# Patient Record
Sex: Female | Born: 1950 | Race: Black or African American | Hispanic: Refuse to answer | Marital: Married | Smoking: Former smoker
Health system: Southern US, Community
[De-identification: ages and names within clinical notes are randomized; demographics above are authoritative.]

## PROBLEM LIST (undated history)

## (undated) DIAGNOSIS — I10 Essential (primary) hypertension: Secondary | ICD-10-CM

## (undated) DIAGNOSIS — Z87891 Personal history of nicotine dependence: Secondary | ICD-10-CM

## (undated) DIAGNOSIS — G459 Transient cerebral ischemic attack, unspecified: Secondary | ICD-10-CM

## (undated) DIAGNOSIS — I4892 Unspecified atrial flutter: Secondary | ICD-10-CM

## (undated) DIAGNOSIS — I6789 Other cerebrovascular disease: Secondary | ICD-10-CM

## (undated) DIAGNOSIS — I714 Abdominal aortic aneurysm, without rupture, unspecified: Secondary | ICD-10-CM

## (undated) DIAGNOSIS — I639 Cerebral infarction, unspecified: Secondary | ICD-10-CM

## (undated) DIAGNOSIS — R0600 Dyspnea, unspecified: Secondary | ICD-10-CM

## (undated) DIAGNOSIS — I2510 Atherosclerotic heart disease of native coronary artery without angina pectoris: Secondary | ICD-10-CM

## (undated) DIAGNOSIS — R0609 Other forms of dyspnea: Secondary | ICD-10-CM

## (undated) DIAGNOSIS — E785 Hyperlipidemia, unspecified: Secondary | ICD-10-CM

## (undated) DIAGNOSIS — I71012 Dissection of descending thoracic aorta: Secondary | ICD-10-CM

## (undated) DIAGNOSIS — G4733 Obstructive sleep apnea (adult) (pediatric): Secondary | ICD-10-CM

## (undated) DIAGNOSIS — I700 Atherosclerosis of aorta: Secondary | ICD-10-CM

## (undated) DIAGNOSIS — J449 Chronic obstructive pulmonary disease, unspecified: Secondary | ICD-10-CM

## (undated) DIAGNOSIS — Z8679 Personal history of other diseases of the circulatory system: Secondary | ICD-10-CM

## (undated) DIAGNOSIS — F32A Depression, unspecified: Secondary | ICD-10-CM

## (undated) DIAGNOSIS — R7303 Prediabetes: Secondary | ICD-10-CM

## (undated) DIAGNOSIS — M1990 Unspecified osteoarthritis, unspecified site: Secondary | ICD-10-CM

## (undated) DIAGNOSIS — K5792 Diverticulitis of intestine, part unspecified, without perforation or abscess without bleeding: Secondary | ICD-10-CM

## (undated) DIAGNOSIS — Z9841 Cataract extraction status, right eye: Secondary | ICD-10-CM

## (undated) DIAGNOSIS — R911 Solitary pulmonary nodule: Secondary | ICD-10-CM

## (undated) DIAGNOSIS — I5189 Other ill-defined heart diseases: Secondary | ICD-10-CM

## (undated) DIAGNOSIS — Z7982 Long term (current) use of aspirin: Secondary | ICD-10-CM

## (undated) DIAGNOSIS — G4730 Sleep apnea, unspecified: Secondary | ICD-10-CM

## (undated) DIAGNOSIS — I209 Angina pectoris, unspecified: Secondary | ICD-10-CM

## (undated) DIAGNOSIS — G629 Polyneuropathy, unspecified: Secondary | ICD-10-CM

## (undated) DIAGNOSIS — D649 Anemia, unspecified: Secondary | ICD-10-CM

## (undated) DIAGNOSIS — K219 Gastro-esophageal reflux disease without esophagitis: Secondary | ICD-10-CM

## (undated) DIAGNOSIS — G709 Myoneural disorder, unspecified: Secondary | ICD-10-CM

## (undated) DIAGNOSIS — Z9842 Cataract extraction status, left eye: Secondary | ICD-10-CM

## (undated) HISTORY — PX: CARDIAC CATHETERIZATION: SHX172

## (undated) HISTORY — PX: ECTOPIC PREGNANCY SURGERY: SHX613

## (undated) HISTORY — PX: CHOLECYSTECTOMY: SHX55

## (undated) HISTORY — PX: ABDOMINAL SURGERY: SHX537

## (undated) HISTORY — DX: Chronic obstructive pulmonary disease, unspecified: J44.9

## (undated) HISTORY — PX: OTHER SURGICAL HISTORY: SHX169

---

## 1982-03-06 DIAGNOSIS — Z9889 Other specified postprocedural states: Secondary | ICD-10-CM | POA: Insufficient documentation

## 2000-02-21 DIAGNOSIS — I059 Rheumatic mitral valve disease, unspecified: Secondary | ICD-10-CM | POA: Insufficient documentation

## 2000-02-21 DIAGNOSIS — F32A Depression, unspecified: Secondary | ICD-10-CM | POA: Insufficient documentation

## 2000-02-21 DIAGNOSIS — D649 Anemia, unspecified: Secondary | ICD-10-CM | POA: Insufficient documentation

## 2004-01-24 ENCOUNTER — Emergency Department: Payer: Self-pay | Admitting: Emergency Medicine

## 2004-02-05 ENCOUNTER — Ambulatory Visit: Payer: Self-pay | Admitting: Family Medicine

## 2005-12-21 ENCOUNTER — Emergency Department: Payer: Self-pay | Admitting: Emergency Medicine

## 2006-01-10 ENCOUNTER — Emergency Department: Payer: Self-pay | Admitting: Emergency Medicine

## 2006-03-24 ENCOUNTER — Emergency Department: Payer: Self-pay | Admitting: Emergency Medicine

## 2006-03-26 ENCOUNTER — Emergency Department: Payer: Self-pay | Admitting: Emergency Medicine

## 2006-08-25 ENCOUNTER — Emergency Department (HOSPITAL_COMMUNITY): Admission: EM | Admit: 2006-08-25 | Discharge: 2006-08-25 | Payer: Self-pay | Admitting: Emergency Medicine

## 2007-12-11 DIAGNOSIS — I10 Essential (primary) hypertension: Secondary | ICD-10-CM | POA: Insufficient documentation

## 2009-01-04 DIAGNOSIS — R319 Hematuria, unspecified: Secondary | ICD-10-CM | POA: Insufficient documentation

## 2009-01-04 DIAGNOSIS — R1032 Left lower quadrant pain: Secondary | ICD-10-CM | POA: Insufficient documentation

## 2009-01-05 ENCOUNTER — Emergency Department: Payer: Self-pay | Admitting: Emergency Medicine

## 2009-01-08 DIAGNOSIS — K5732 Diverticulitis of large intestine without perforation or abscess without bleeding: Secondary | ICD-10-CM | POA: Insufficient documentation

## 2009-02-22 ENCOUNTER — Ambulatory Visit: Payer: Self-pay | Admitting: Gastroenterology

## 2009-02-22 LAB — HM COLONOSCOPY

## 2010-05-29 ENCOUNTER — Emergency Department: Payer: Self-pay | Admitting: Emergency Medicine

## 2010-12-21 LAB — D-DIMER, QUANTITATIVE: D-Dimer, Quant: <0.22

## 2010-12-21 LAB — I-STAT 8, (EC8 V) (CONVERTED LAB)
Acid-Base Excess: 1
BUN: 16
Bicarbonate: 27 — ABNORMAL HIGH
Chloride: 105
Glucose, Bld: 88
HCT: 45
Hemoglobin: 15.3 — ABNORMAL HIGH
Operator id: 196461
Potassium: 4.1
Sodium: 138
TCO2: 28
pCO2, Ven: 46.6
pH, Ven: 7.371 — ABNORMAL HIGH

## 2010-12-21 LAB — POCT CARDIAC MARKERS
CKMB, poc: <1 — ABNORMAL LOW
Myoglobin, poc: 43.5
Operator id: 196461
Troponin i, poc: <0.05

## 2010-12-21 LAB — POCT I-STAT CREATININE
Creatinine, Ser: 0.9
Operator id: 196461

## 2011-10-29 ENCOUNTER — Emergency Department: Payer: Self-pay | Admitting: Emergency Medicine

## 2011-10-29 LAB — COMPREHENSIVE METABOLIC PANEL
Albumin: 3.3 g/dL — ABNORMAL LOW (ref 3.4–5.0)
Alkaline Phosphatase: 93 U/L (ref 50–136)
Anion Gap: 7 (ref 7–16)
BUN: 18 mg/dL (ref 7–18)
Bilirubin,Total: 0.4 mg/dL (ref 0.2–1.0)
Calcium, Total: 9.1 mg/dL (ref 8.5–10.1)
Chloride: 108 mmol/L — ABNORMAL HIGH (ref 98–107)
Co2: 26 mmol/L (ref 21–32)
Creatinine: 0.87 mg/dL (ref 0.60–1.30)
EGFR (African American): >60
EGFR (Non-African Amer.): >60
Glucose: 104 mg/dL — ABNORMAL HIGH (ref 65–99)
Osmolality: 283 (ref 275–301)
Potassium: 3.3 mmol/L — ABNORMAL LOW (ref 3.5–5.1)
SGOT(AST): 22 U/L (ref 15–37)
SGPT (ALT): 20 U/L (ref 12–78)
Sodium: 141 mmol/L (ref 136–145)
Total Protein: 7 g/dL (ref 6.4–8.2)

## 2011-10-29 LAB — URINALYSIS, COMPLETE
Bilirubin,UR: NEGATIVE
Glucose,UR: NEGATIVE mg/dL (ref 0–75)
Ketone: NEGATIVE
Nitrite: NEGATIVE
Ph: 5 (ref 4.5–8.0)
Protein: NEGATIVE
RBC,UR: 5 /HPF (ref 0–5)
Specific Gravity: 1.027 (ref 1.003–1.030)
Squamous Epithelial: 17
WBC UR: 19 /HPF (ref 0–5)

## 2011-10-29 LAB — CBC
HCT: 37.9 % (ref 35.0–47.0)
HGB: 13.1 g/dL (ref 12.0–16.0)
MCH: 31.2 pg (ref 26.0–34.0)
MCHC: 34.6 g/dL (ref 32.0–36.0)
MCV: 90 fL (ref 80–100)
Platelet: 170 10*3/uL (ref 150–440)
RBC: 4.21 10*6/uL (ref 3.80–5.20)
RDW: 13.2 % (ref 11.5–14.5)
WBC: 7.7 10*3/uL (ref 3.6–11.0)

## 2011-10-29 LAB — CK TOTAL AND CKMB (NOT AT ARMC)
CK, Total: 85 U/L (ref 21–215)
CK-MB: <0.5 ng/mL — ABNORMAL LOW (ref 0.5–3.6)

## 2011-10-29 LAB — TSH: Thyroid Stimulating Horm: 0.746 u[IU]/mL

## 2011-10-29 LAB — MAGNESIUM: Magnesium: 1.8 mg/dL

## 2011-10-29 LAB — TROPONIN I: Troponin-I: <0.02 ng/mL

## 2011-11-28 ENCOUNTER — Ambulatory Visit: Payer: Self-pay | Admitting: Family Medicine

## 2011-12-08 ENCOUNTER — Ambulatory Visit: Payer: Self-pay | Admitting: Family Medicine

## 2013-05-17 ENCOUNTER — Emergency Department: Payer: Self-pay | Admitting: Internal Medicine

## 2013-06-29 ENCOUNTER — Emergency Department: Payer: Self-pay | Admitting: Emergency Medicine

## 2015-01-11 ENCOUNTER — Other Ambulatory Visit: Payer: Self-pay | Admitting: Family Medicine

## 2015-01-12 NOTE — Telephone Encounter (Signed)
Last OV 05/24/2012  Thanks,   -Mickel Baas

## 2015-07-28 ENCOUNTER — Emergency Department
Admission: EM | Admit: 2015-07-28 | Discharge: 2015-07-28 | Disposition: A | Discharge status: Home / Self Care | Payer: Federal, State, Local not specified - PPO | Attending: Emergency Medicine | Admitting: Emergency Medicine

## 2015-07-28 ENCOUNTER — Emergency Department: Payer: Federal, State, Local not specified - PPO

## 2015-07-28 ENCOUNTER — Encounter: Payer: Self-pay | Admitting: Emergency Medicine

## 2015-07-28 DIAGNOSIS — I10 Essential (primary) hypertension: Secondary | ICD-10-CM | POA: Diagnosis not present

## 2015-07-28 DIAGNOSIS — F17200 Nicotine dependence, unspecified, uncomplicated: Secondary | ICD-10-CM | POA: Insufficient documentation

## 2015-07-28 DIAGNOSIS — M25511 Pain in right shoulder: Secondary | ICD-10-CM

## 2015-07-28 HISTORY — DX: Essential (primary) hypertension: I10

## 2015-07-28 MED ORDER — KETOROLAC TROMETHAMINE 30 MG/ML IJ SOLN
30.0000 mg | Freq: Once | INTRAMUSCULAR | Status: AC
  Administered 2015-07-28: 30 mg via INTRAMUSCULAR
  Filled 2015-07-28: qty 1

## 2015-07-28 MED ORDER — LORAZEPAM 2 MG/ML IJ SOLN
2.0000 mg | Freq: Once | INTRAMUSCULAR | Status: AC
  Administered 2015-07-28: 2 mg via INTRAMUSCULAR
  Filled 2015-07-28: qty 1

## 2015-07-28 MED ORDER — HYDROMORPHONE HCL 2 MG PO TABS
2.0000 mg | ORAL_TABLET | Freq: Once | ORAL | Status: AC
  Administered 2015-07-28: 2 mg via ORAL
  Filled 2015-07-28: qty 1

## 2015-07-28 MED ORDER — IBUPROFEN 400 MG PO TABS: 400.0000 mg | ORAL_TABLET | Freq: Four times a day (QID) | ORAL | Status: DC | PRN

## 2015-07-28 MED ORDER — HYDROCODONE-ACETAMINOPHEN 5-325 MG PO TABS: 1.0000 | ORAL_TABLET | Freq: Four times a day (QID) | ORAL | Status: DC | PRN

## 2015-07-28 MED ORDER — LORAZEPAM 2 MG/ML IJ SOLN: 2.0000 mg | Freq: Once | INTRAMUSCULAR | Status: DC

## 2015-07-28 MED ORDER — LORAZEPAM 1 MG PO TABS: 1.0000 mg | ORAL_TABLET | Freq: Three times a day (TID) | ORAL | Status: DC | PRN

## 2015-07-28 NOTE — ED Notes (Signed)
Pt with right shoulder pain for three days, no known injury, hurts worse when taking a deep breath,.

## 2015-07-28 NOTE — ED Provider Notes (Signed)
Time Seen: Approximately 2240 I have reviewed the triage notes  Chief Complaint: Shoulder Pain   History of Present Illness: Angel Lane is a 65 y.o. female *who presents with gradual onset of increasing right-sided shoulder pain. She states her pain started in her right shoulder with a popping and cracking sounds and seemingly some localized inflammation. The pain then spread up into her right side of her neck and also into the right scapular region. She states it hurts to take a deep breath and any movement. She especially has a hard time turning her head to the right. The patient denies any trauma. She denies any left-sided chest pain. She denies any abdominal pain. She denies any nausea, vomiting. She does state that it hurts her to take a deep breath primarily in the right side of her rib cage region.   Past Medical History  Diagnosis Date  . Hypertension     There are no active problems to display for this patient.   Past Surgical History  Procedure Laterality Date  . Cholecystectomy    . Abdominal surgery      Past Surgical History  Procedure Laterality Date  . Cholecystectomy    . Abdominal surgery      Current Outpatient Rx  Name  Route  Sig  Dispense  Refill  . HYDROcodone-acetaminophen (NORCO) 5-325 MG tablet   Oral   Take 1 tablet by mouth every 6 (six) hours as needed for moderate pain.   20 tablet   0   . ibuprofen (ADVIL,MOTRIN) 400 MG tablet   Oral   Take 1 tablet (400 mg total) by mouth every 6 (six) hours as needed.   30 tablet   0   . LORazepam (ATIVAN) 1 MG tablet   Oral   Take 1 tablet (1 mg total) by mouth every 8 (eight) hours as needed (muscle spasm).   10 tablet   0     Allergies:  Penicillins  Family History: No family history on file.  Social History: Social History  Substance Use Topics  . Smoking status: Current Some Day Smoker  . Smokeless tobacco: None  . Alcohol Use: No     Review of Systems:   10 point review  of systems was performed and was otherwise negative:  Constitutional: No fever Eyes: No visual disturbances ENT: No sore throat, ear pain Cardiac: Mild posterior right rib pain Respiratory: No shortness of breath, wheezing, or stridor, mild pleuritic pain Abdomen: No abdominal pain, no vomiting, No diarrhea Endocrine: No weight loss, No night sweats Extremities: No peripheral edema, cyanosis Skin: No rashes, easy bruising Neurologic: No focal weakness, trouble with speech or swollowing Urologic: No dysuria, Hematuria, or urinary frequency   Physical Exam:  ED Triage Vitals  Enc Vitals Group     BP 07/28/15 1737 146/97 mmHg     Pulse Rate 07/28/15 1737 79     Resp 07/28/15 1737 20     Temp 07/28/15 1737 98.6 F (37 C)     Temp Source 07/28/15 1737 Oral     SpO2 07/28/15 1737 100 %     Weight 07/28/15 1737 167 lb (75.751 kg)     Height 07/28/15 1737 5\' 4"  (1.626 m)     Head Cir --      Peak Flow --      Pain Score 07/28/15 1737 10     Pain Loc --      Pain Edu? --  Excl. in [place]? --     General: Awake , Alert , and Oriented times 3; GCS 15 Head: Normal cephalic , atraumatic Eyes: Pupils equal , round, reactive to light Nose/Throat: No nasal drainage, patent upper airway without erythema or exudate.  Neck: Supple, Full range of motion, No anterior adenopathy or palpable thyroid masses Lungs: Clear to ascultation without wheezes , rhonchi, or rales Heart: Regular rate, regular rhythm without murmurs , gallops , or rubs Abdomen: Soft, non tender without rebound, guarding , or rigidity; bowel sounds positive and symmetric in all 4 quadrants. No organomegaly .        Extremities: Examination of the right shoulder shows some limited range of motion especially with raising her arm up laterally. This motion seems to initiate most of the spasm pain that she has up into the right side of her neck trapezius region and the right scaphoid region. Patient's extremities otherwise  neurovascularly intact. The patient's pain is reproducible to palpation and also directly with no erythema or rash noted. Neurologic: normal ambulation, Motor symmetric without deficits, sensory intact Skin: warm, dry, no rashes   L  EKG:  ED ECG REPORT I, Daymon Larsen, the attending physician, personally viewed and interpreted this ECG.  Date: 07/28/2015 EKG Time: 1745 Rate: 79 Rhythm: normal sinus rhythm QRS Axis: normal Intervals: normal ST/T Wave abnormalities: normal Conduction Disturbances: QRS complex is no scrotal septal leads with some nonspecific T-wave abnormalities Narrative Interpretation: unremarkable No obvious acute ischemic change   Radiology:  CLINICAL DATA: Chest pain with inspiration, 3 days duration.  EXAM: CHEST 2 VIEW  COMPARISON: 06/29/2013  FINDINGS: Heart size is normal. There is chronic calcification of the aorta. The lungs show chronic interstitial markings but without evidence of active infiltrate, mass, effusion or collapse. These are progressive over time. No acute bone finding.  IMPRESSION: Chronic interstitial lung markings, worsening over time. No evidence of infiltrate, collapse, effusion or pneumothorax.   Electronically Signed By: Nelson Chimes M.D. On: 07/28/2015 18:07   I personally reviewed the radiologic studies    ED Course: * Patient was given a shot of IM Ativan, IM Toradol, was given by mouth Dilaudid for pain. She describes symptomatic improvement. Her pain is very reproducible and I felt started most likely was some impingement syndrome with radiation the pain and then up into the right trapezius region. She was prescribed Ativan, Norco, was advised take over-the-counter ibuprofen for pain. He was advised to establish moist warm heat and was given a right arm shoulder sling for stabilization.    Assessment: * Right shoulder impingement syndrome with surrounding muscle spasm and bursitis  Final Clinical  Impression:   Final diagnoses:  Shoulder pain, acute, right     Plan:  Outpatient management Patient was advised to return immediately if condition worsens. Patient was advised to follow up with their primary care physician or other specialized physicians involved in their outpatient care. The patient and/or family member/power of attorney had laboratory results reviewed at the bedside. All questions and concerns were addressed and appropriate discharge instructions were distributed by the nursing staff.             Daymon Larsen, MD 07/28/15 2233

## 2015-07-28 NOTE — Discharge Instructions (Signed)
Heat Therapy Heat therapy can help ease sore, stiff, injured, and tight muscles and joints. Heat relaxes your muscles, which may help ease your pain.  RISKS AND COMPLICATIONS If you have any of the following conditions, do not use heat therapy unless your health care provider has approved:  Poor circulation.  Healing wounds or scarred skin in the area being treated.  Diabetes, heart disease, or high blood pressure.  Not being able to feel (numbness) the area being treated.  Unusual swelling of the area being treated.  Active infections.  Blood clots.  Cancer.  Inability to communicate pain. This may include young children and people who have problems with their brain function (dementia).  Pregnancy. Heat therapy should only be used on old, pre-existing, or long-lasting (chronic) injuries. Do not use heat therapy on new injuries unless directed by your health care provider. HOW TO USE HEAT THERAPY There are several different kinds of heat therapy, including:  Moist heat pack.  Warm water bath.  Hot water bottle.  Electric heating pad.  Heated gel pack.  Heated wrap.  Electric heating pad. Use the heat therapy method suggested by your health care provider. Follow your health care provider's instructions on when and how to use heat therapy. GENERAL HEAT THERAPY RECOMMENDATIONS  Do not sleep while using heat therapy. Only use heat therapy while you are awake.  Your skin may turn pink while using heat therapy. Do not use heat therapy if your skin turns red.  Do not use heat therapy if you have new pain.  High heat or long exposure to heat can cause burns. Be careful when using heat therapy to avoid burning your skin.  Do not use heat therapy on areas of your skin that are already irritated, such as with a rash or sunburn. SEEK MEDICAL CARE IF:  You have blisters, redness, swelling, or numbness.  You have new pain.  Your pain is worse. MAKE SURE  YOU:  Understand these instructions.  Will watch your condition.  Will get help right away if you are not doing well or get worse.   This information is not intended to replace advice given to you by your health care provider. Make sure you discuss any questions you have with your health care provider.   Document Released: 05/15/2011 Document Revised: 03/13/2014 Document Reviewed: 04/15/2013 Elsevier Interactive Patient Education 2016 Else[employer]  Shoulder Pain The shoulder is the joint that connects your arms to your body. The bones that form the shoulder joint include the upper arm bone (humerus), the shoulder blade (scapula), and the collarbone (clavicle). The top of the humerus is shaped like a ball and fits into a rather flat socket on the scapula (glenoid cavity). A combination of muscles and strong, fibrous tissues that connect muscles to bones (tendons) support your shoulder joint and hold the ball in the socket. Small, fluid-filled sacs (bursae) are located in different areas of the joint. They act as cushions between the bones and the overlying soft tissues and help reduce friction between the gliding tendons and the bone as you move your arm. Your shoulder joint allows a wide range of motion in your arm. This range of motion allows you to do things like scratch your back or throw a ball. However, this range of motion also makes your shoulder more prone to pain from overuse and injury. Causes of shoulder pain can originate from both injury and overuse and usually can be grouped in the following four categories:  Redness,  swelling, and pain (inflammation) of the tendon (tendinitis) or the bursae (bursitis).  Instability, such as a dislocation of the joint.  Inflammation of the joint (arthritis).  Broken bone (fracture). HOME CARE INSTRUCTIONS   Apply ice to the sore area.  Put ice in a plastic bag.  Place a towel between your skin and the bag.  Leave the ice on for 15-20  minutes, 3-4 times per day for the first 2 days, or as directed by your health care provider.  Stop using cold packs if they do not help with the pain.  If you have a shoulder sling or immobilizer, wear it as long as your caregiver instructs. Only remove it to shower or bathe. Move your arm as little as possible, but keep your hand moving to prevent swelling.  Squeeze a soft ball or foam pad as much as possible to help prevent swelling.  Only take over-the-counter or prescription medicines for pain, discomfort, or fever as directed by your caregiver. SEEK MEDICAL CARE IF:   Your shoulder pain increases, or new pain develops in your arm, hand, or fingers.  Your hand or fingers become cold and numb.  Your pain is not relieved with medicines. SEEK IMMEDIATE MEDICAL CARE IF:   Your arm, hand, or fingers are numb or tingling.  Your arm, hand, or fingers are significantly swollen or turn white or blue. MAKE SURE YOU:   Understand these instructions.  Will watch your condition.  Will get help right away if you are not doing well or get worse.   This information is not intended to replace advice given to you by your health care provider. Make sure you discuss any questions you have with your health care provider.   Document Released: 11/30/2004 Document Revised: 03/13/2014 Document Reviewed: 06/15/2014 Elsevier Interactive Patient Education [employer].

## 2015-07-30 ENCOUNTER — Telehealth: Payer: Self-pay

## 2015-07-30 NOTE — Telephone Encounter (Signed)
paient was in ER for shoulder pain and needs to follow up and your schedule is all blocked for next week or has same day openings. Please let patient know when she can come in on Tuesday or wed. Thank you-aa

## 2015-07-30 NOTE — Telephone Encounter (Signed)
Ok to put in same day. Thanks.   

## 2015-08-05 NOTE — Telephone Encounter (Signed)
Left message to call back to see if patient still wants to be seen.

## 2016-03-06 DIAGNOSIS — I6381 Other cerebral infarction due to occlusion or stenosis of small artery: Secondary | ICD-10-CM

## 2016-03-06 HISTORY — DX: Other cerebral infarction due to occlusion or stenosis of small artery: I63.81

## 2016-11-25 ENCOUNTER — Emergency Department
Admission: EM | Admit: 2016-11-25 | Discharge: 2016-11-25 | Disposition: A | Discharge status: Home / Self Care | Payer: Federal, State, Local not specified - PPO | Attending: Emergency Medicine | Admitting: Emergency Medicine

## 2016-11-25 ENCOUNTER — Encounter: Payer: Self-pay | Admitting: Emergency Medicine

## 2016-11-25 DIAGNOSIS — R002 Palpitations: Secondary | ICD-10-CM | POA: Diagnosis present

## 2016-11-25 DIAGNOSIS — Z5321 Procedure and treatment not carried out due to patient leaving prior to being seen by health care provider: Secondary | ICD-10-CM | POA: Diagnosis not present

## 2016-11-25 LAB — CBC
HCT: 39.8 % (ref 35.0–47.0)
Hemoglobin: 13.7 g/dL (ref 12.0–16.0)
MCH: 30.6 pg (ref 26.0–34.0)
MCHC: 34.4 g/dL (ref 32.0–36.0)
MCV: 89.1 fL (ref 80.0–100.0)
Platelets: 182 10*3/uL (ref 150–440)
RBC: 4.46 MIL/uL (ref 3.80–5.20)
RDW: 13.2 % (ref 11.5–14.5)
WBC: 7.7 10*3/uL (ref 3.6–11.0)

## 2016-11-25 LAB — BASIC METABOLIC PANEL
Anion gap: 8 (ref 5–15)
BUN: 21 mg/dL — ABNORMAL HIGH (ref 6–20)
CO2: 27 mmol/L (ref 22–32)
Calcium: 9.3 mg/dL (ref 8.9–10.3)
Chloride: 105 mmol/L (ref 101–111)
Creatinine, Ser: 0.8 mg/dL (ref 0.44–1.00)
GFR calc Af Amer: >60 mL/min (ref >60)
GFR calc non Af Amer: >60 mL/min (ref >60)
Glucose, Bld: 100 mg/dL — ABNORMAL HIGH (ref 65–99)
Potassium: 3.8 mmol/L (ref 3.5–5.1)
Sodium: 140 mmol/L (ref 135–145)

## 2016-11-25 LAB — TROPONIN I: Troponin I: <0.03 ng/mL (ref <0.03)

## 2016-11-25 NOTE — ED Notes (Signed)
Pt reports BP was much lower than she thought and she feels okay so she wants to leave. Pt reports she is going to call her PCP Monday. NAD noted at this time.

## 2016-11-25 NOTE — ED Triage Notes (Signed)
States had episode of palpitations yesterday. States monitor has been reading hypertensive however patitents monitor reads high in triage while has normal reading for [place].

## 2016-12-01 ENCOUNTER — Encounter: Payer: Self-pay | Admitting: Physician Assistant

## 2016-12-07 ENCOUNTER — Encounter: Payer: Self-pay | Admitting: Physician Assistant

## 2016-12-14 ENCOUNTER — Encounter: Payer: Self-pay | Admitting: Physician Assistant

## 2016-12-23 ENCOUNTER — Emergency Department
Admission: EM | Admit: 2016-12-23 | Discharge: 2016-12-23 | Disposition: A | Discharge status: Home / Self Care | Payer: Federal, State, Local not specified - PPO | Attending: Emergency Medicine | Admitting: Emergency Medicine

## 2016-12-23 ENCOUNTER — Emergency Department: Payer: Federal, State, Local not specified - PPO

## 2016-12-23 DIAGNOSIS — F17210 Nicotine dependence, cigarettes, uncomplicated: Secondary | ICD-10-CM | POA: Diagnosis not present

## 2016-12-23 DIAGNOSIS — I10 Essential (primary) hypertension: Secondary | ICD-10-CM | POA: Diagnosis not present

## 2016-12-23 DIAGNOSIS — L03211 Cellulitis of face: Secondary | ICD-10-CM | POA: Diagnosis not present

## 2016-12-23 DIAGNOSIS — Z79899 Other long term (current) drug therapy: Secondary | ICD-10-CM | POA: Diagnosis not present

## 2016-12-23 DIAGNOSIS — R600 Localized edema: Secondary | ICD-10-CM | POA: Diagnosis present

## 2016-12-23 LAB — CBC WITH DIFFERENTIAL/PLATELET
Basophils Absolute: 0 10*3/uL (ref 0–0.1)
Basophils Relative: 0 %
Eosinophils Absolute: 0 10*3/uL (ref 0–0.7)
Eosinophils Relative: 0 %
HCT: 43.2 % (ref 35.0–47.0)
Hemoglobin: 14.4 g/dL (ref 12.0–16.0)
Lymphocytes Relative: 24 %
Lymphs Abs: 2 10*3/uL (ref 1.0–3.6)
MCH: 30.3 pg (ref 26.0–34.0)
MCHC: 33.4 g/dL (ref 32.0–36.0)
MCV: 90.6 fL (ref 80.0–100.0)
Monocytes Absolute: 0.8 10*3/uL (ref 0.2–0.9)
Monocytes Relative: 10 %
Neutro Abs: 5.5 10*3/uL (ref 1.4–6.5)
Neutrophils Relative %: 66 %
Platelets: 173 10*3/uL (ref 150–440)
RBC: 4.77 MIL/uL (ref 3.80–5.20)
RDW: 13.5 % (ref 11.5–14.5)
WBC: 8.4 10*3/uL (ref 3.6–11.0)

## 2016-12-23 LAB — BASIC METABOLIC PANEL
Anion gap: 10 (ref 5–15)
BUN: 19 mg/dL (ref 6–20)
CO2: 26 mmol/L (ref 22–32)
Calcium: 9.5 mg/dL (ref 8.9–10.3)
Chloride: 99 mmol/L — ABNORMAL LOW (ref 101–111)
Creatinine, Ser: 0.9 mg/dL (ref 0.44–1.00)
GFR calc Af Amer: >60 mL/min (ref >60)
GFR calc non Af Amer: >60 mL/min (ref >60)
Glucose, Bld: 130 mg/dL — ABNORMAL HIGH (ref 65–99)
Potassium: 3.4 mmol/L — ABNORMAL LOW (ref 3.5–5.1)
Sodium: 135 mmol/L (ref 135–145)

## 2016-12-23 LAB — GLUCOSE, CAPILLARY: Glucose-Capillary: 107 mg/dL — ABNORMAL HIGH (ref 65–99)

## 2016-12-23 MED ORDER — KETOROLAC TROMETHAMINE 30 MG/ML IJ SOLN
10.0000 mg | Freq: Once | INTRAMUSCULAR | Status: AC
  Administered 2016-12-23: 9.9 mg via INTRAVENOUS
  Filled 2016-12-23: qty 1

## 2016-12-23 MED ORDER — DEXAMETHASONE SODIUM PHOSPHATE 10 MG/ML IJ SOLN
10.0000 mg | Freq: Once | INTRAMUSCULAR | Status: AC
  Administered 2016-12-23: 10 mg via INTRAVENOUS
  Filled 2016-12-23: qty 1

## 2016-12-23 MED ORDER — CLINDAMYCIN PHOSPHATE 600 MG/50ML IV SOLN
600.0000 mg | Freq: Once | INTRAVENOUS | Status: AC
  Administered 2016-12-23: 600 mg via INTRAVENOUS
  Filled 2016-12-23 (×2): qty 50

## 2016-12-23 MED ORDER — KETOROLAC TROMETHAMINE 30 MG/ML IJ SOLN: 15.0000 mg | Freq: Once | INTRAMUSCULAR | Status: DC

## 2016-12-23 MED ORDER — SODIUM CHLORIDE 0.9 % IV BOLUS (SEPSIS)
500.0000 mL | Freq: Once | INTRAVENOUS | Status: AC
  Administered 2016-12-23: 500 mL via INTRAVENOUS

## 2016-12-23 MED ORDER — CLINDAMYCIN HCL 300 MG PO CAPS: 300.0000 mg | ORAL_CAPSULE | Freq: Three times a day (TID) | ORAL | 0 refills | Status: AC

## 2016-12-23 MED ORDER — IOPAMIDOL (ISOVUE-300) INJECTION 61%
75.0000 mL | Freq: Once | INTRAVENOUS | Status: AC | PRN
  Administered 2016-12-23: 75 mL via INTRAVENOUS

## 2016-12-23 NOTE — ED Triage Notes (Signed)
Pt came to ED via pov, c/o left sided facial swelling starting 2 days ago. Redness to left ear. Airway is patent, no trouble swallowing or difficulty breathing. VS stable. Temperature 100.3. Pt took advil 2 hours prior to arrival.

## 2016-12-23 NOTE — Discharge Instructions (Signed)

## 2016-12-23 NOTE — ED Provider Notes (Signed)
[facility] Emergency Department Provider Note  ____________________________________________  Time seen: Approximately 6:03 PM  I have reviewed the triage vital signs and the nursing notes.   HISTORY  Chief Complaint Facial Swelling   HPI Angel Lane is a 66 y.o. female no significant past medical history who presents for evaluation of a swelling and pain to the left ear associated with fever. Patient reports that her symptoms have been ongoing for 2 days.MAXIMUM TEMPERATURE of 100.86F at home. She reports redness, swelling, and significant tenderness to palpation surrounding her left ear, no pain inside her ear, no sore throat, no difficulty swallowing or speaking, no stridor, handling her saliva without difficulty, no tongue swelling, no neck stiffness. Her pain is mild but increases with palpation of the ear.  Past Medical History:  Diagnosis Date  . Hypertension     There are no active problems to display for this patient.   Past Surgical History:  Procedure Laterality Date  . ABDOMINAL SURGERY    . CHOLECYSTECTOMY      Prior to Admission medications   Medication Sig Start Date End Date Taking? Authorizing Provider  clindamycin (CLEOCIN) 300 MG capsule Take 1 capsule (300 mg total) by mouth 3 (three) times daily. 12/23/16 01/02/17  Rudene Re, MD  HYDROcodone-acetaminophen (NORCO) 5-325 MG tablet Take 1 tablet by mouth every 6 (six) hours as needed for moderate pain. 07/28/15   Daymon Larsen, MD  ibuprofen (ADVIL,MOTRIN) 400 MG tablet Take 1 tablet (400 mg total) by mouth every 6 (six) hours as needed. 07/28/15   Daymon Larsen, MD  LORazepam (ATIVAN) 1 MG tablet Take 1 tablet (1 mg total) by mouth every 8 (eight) hours as needed (muscle spasm). 07/28/15   Daymon Larsen, MD    Allergies Penicillins  No family history on file.  Social History Social History  Substance Use Topics  . Smoking status: Current Some Day Smoker      Packs/day: 1.00    Types: Cigarettes  . Smokeless tobacco: Not on file  . Alcohol use No    Review of Systems  Constitutional: + fever. Eyes: Negative for visual changes. ENT: Negative for sore throat. + pain and swelling of the L ear Neck: No neck pain  Cardiovascular: Negative for chest pain. Respiratory: Negative for shortness of breath. Gastrointestinal: Negative for abdominal pain, vomiting or diarrhea. Genitourinary: Negative for dysuria. Musculoskeletal: Negative for back pain. Skin: Negative for rash. Neurological: Negative for headaches, weakness or numbness. Psych: No SI or HI  ____________________________________________   PHYSICAL EXAM:  VITAL SIGNS: ED Triage Vitals [12/23/16 1606]  Enc Vitals Group     BP (!) 152/85     Pulse Rate 91     Resp 16     Temp 100.3 F (37.9 C)     Temp Source Oral     SpO2 98 %     Weight 179 lb (81.2 kg)     Height 5\' 5"  (1.651 m)     Head Circumference      Peak Flow      Pain Score      Pain Loc      Pain Edu?      Excl. in [place]?     Constitutional: Alert and oriented. Well appearing and in no apparent distress. HEENT:      Head: Normocephalic and atraumatic.         Eyes: Conjunctivae are normal. Sclera is non-icteric.  Mouth/Throat: Mucous membranes are moist. No trismus. Oropharynx is clear, no swelling or exudates, tongue is normal, floor of the mouth is normal, no obvious cavities, airways patent, patient is handling her saliva and breathing with no difficulty.      Ear: External ear canal is clear with no erythema or swelling, TMs clear, patient has significant swelling, tenderness, and induration surrounding the left ear with the surrounding lymphadenopathy. mastoid processes normal with no erythema, warmth, or tenderness.      Neck: Supple with no signs of meningismus. Cardiovascular: Regular rate and rhythm. No murmurs, gallops, or rubs. 2+ symmetrical distal pulses are present in all extremities. No  JVD. Respiratory: Normal respiratory effort. Lungs are clear to auscultation bilaterally. No wheezes, crackles, or rhonchi.  Musculoskeletal: Nontender with normal range of motion in all extremities. No edema, cyanosis, or erythema of extremities. Neurologic: Normal speech and language. Face is symmetric. Moving all extremities. No gross focal neurologic deficits are appreciated. Skin: Skin is warm, dry and intact. No rash noted. Psychiatric: Mood and affect are normal. Speech and behavior are normal.     ____________________________________________   LABS (all labs ordered are listed, but only abnormal results are displayed)  Labs Reviewed  BASIC METABOLIC PANEL - Abnormal; Notable for the following:       Result Value   Potassium 3.4 (*)    Chloride 99 (*)    Glucose, Bld 130 (*)    All other components within normal limits  CBC WITH DIFFERENTIAL/PLATELET   ____________________________________________  EKG  none  ____________________________________________  RADIOLOGY  CT face: Subcutaneous edema and skin thickening anterior to the external auditory canal on the left. This also involves the ear lobe and pinna. Probable cellulitis. Negative for abscess or mass lesion  ____________________________________________   PROCEDURES  Procedure(s) performed: None Procedures Critical Care performed:  None ____________________________________________   INITIAL IMPRESSION / ASSESSMENT AND PLAN / ED COURSE  66 y.o. female no significant past medical history who presents for evaluation of a swelling, erythema, induration, and pain to the left ear associated with fever x 2 days. please refer to picture above for exam, patient has erythema, induration, and significant tenderness to palpation surrounding the left ear including lobule, tragus, external canal and TMs are normal. Patient also has lymphadenopathy in the region, no tenderness over the mastoid process. Ddx cellulitis,  abscess. Will start patient on decadron and clindamycin, will get CT with contrast to better evaluate this infection,    _________________________ 7:33 PM on 12/23/2016 -----------------------------------------  CT with no evidence of abscess or mass and consistent with cellulitis.labs are within normal limits with a normal white count.patient has slightly elevated glucose however this is not a fasting BMP. Patient received a dose of IV clindamycin and fluids. We'll discharge home on a 10 day course of plan that 3 times a day and close follow-up with primary care doctor. Discussed return precautions with patient and her husband.   As part of my medical decision making, I reviewed the following data within the [place] History obtained from family, Nursing notes reviewed and incorporated, Labs reviewed , Radiograph reviewed , Notes from prior ED visits and [place] Controlled Substance Database    Pertinent labs & imaging results that were available during my care of the patient were reviewed by me and considered in my medical decision making (see chart for details).    ____________________________________________   FINAL CLINICAL IMPRESSION(S) / ED DIAGNOSES  Final diagnoses:  Facial cellulitis  NEW MEDICATIONS STARTED DURING THIS VISIT:  New Prescriptions   CLINDAMYCIN (CLEOCIN) 300 MG CAPSULE    Take 1 capsule (300 mg total) by mouth 3 (three) times daily.     Note:  This document was prepared using Dragon voice recognition software and may include unintentional dictation errors.    Rudene Re, MD 12/23/16 [phone]

## 2017-01-05 ENCOUNTER — Inpatient Hospital Stay
Admission: EM | Admit: 2017-01-05 | Discharge: 2017-01-08 | DRG: 065 | Disposition: A | Discharge status: Home / Self Care | Payer: Medicare Other | Attending: Internal Medicine | Admitting: Internal Medicine

## 2017-01-05 ENCOUNTER — Emergency Department: Payer: Medicare Other

## 2017-01-05 DIAGNOSIS — I6381 Other cerebral infarction due to occlusion or stenosis of small artery: Secondary | ICD-10-CM | POA: Diagnosis not present

## 2017-01-05 DIAGNOSIS — I639 Cerebral infarction, unspecified: Secondary | ICD-10-CM | POA: Diagnosis present

## 2017-01-05 DIAGNOSIS — G8194 Hemiplegia, unspecified affecting left nondominant side: Secondary | ICD-10-CM | POA: Diagnosis present

## 2017-01-05 DIAGNOSIS — R29705 NIHSS score 5: Secondary | ICD-10-CM | POA: Diagnosis present

## 2017-01-05 DIAGNOSIS — F17210 Nicotine dependence, cigarettes, uncomplicated: Secondary | ICD-10-CM | POA: Diagnosis present

## 2017-01-05 DIAGNOSIS — R29810 Facial weakness: Secondary | ICD-10-CM | POA: Diagnosis present

## 2017-01-05 DIAGNOSIS — R270 Ataxia, unspecified: Secondary | ICD-10-CM | POA: Diagnosis present

## 2017-01-05 DIAGNOSIS — R778 Other specified abnormalities of plasma proteins: Secondary | ICD-10-CM | POA: Diagnosis present

## 2017-01-05 DIAGNOSIS — R7989 Other specified abnormal findings of blood chemistry: Secondary | ICD-10-CM | POA: Diagnosis present

## 2017-01-05 DIAGNOSIS — Z880 Allergy status to penicillin: Secondary | ICD-10-CM

## 2017-01-05 DIAGNOSIS — I10 Essential (primary) hypertension: Secondary | ICD-10-CM | POA: Diagnosis present

## 2017-01-05 DIAGNOSIS — Z9114 Patient's other noncompliance with medication regimen: Secondary | ICD-10-CM

## 2017-01-05 DIAGNOSIS — Z8673 Personal history of transient ischemic attack (TIA), and cerebral infarction without residual deficits: Secondary | ICD-10-CM

## 2017-01-05 LAB — COMPREHENSIVE METABOLIC PANEL
ALT: 16 U/L (ref 14–54)
AST: 23 U/L (ref 15–41)
Albumin: 3.7 g/dL (ref 3.5–5.0)
Alkaline Phosphatase: 81 U/L (ref 38–126)
Anion gap: 6 (ref 5–15)
BUN: 20 mg/dL (ref 6–20)
CO2: 26 mmol/L (ref 22–32)
Calcium: 9.2 mg/dL (ref 8.9–10.3)
Chloride: 106 mmol/L (ref 101–111)
Creatinine, Ser: 0.77 mg/dL (ref 0.44–1.00)
GFR calc Af Amer: >60 mL/min (ref >60)
GFR calc non Af Amer: >60 mL/min (ref >60)
Glucose, Bld: 109 mg/dL — ABNORMAL HIGH (ref 65–99)
Potassium: 3.7 mmol/L (ref 3.5–5.1)
Sodium: 138 mmol/L (ref 135–145)
Total Bilirubin: 0.7 mg/dL (ref 0.3–1.2)
Total Protein: 7.2 g/dL (ref 6.5–8.1)

## 2017-01-05 LAB — DIFFERENTIAL
Basophils Absolute: 0 10*3/uL (ref 0–0.1)
Basophils Relative: 0 %
Eosinophils Absolute: 0.2 10*3/uL (ref 0–0.7)
Eosinophils Relative: 2 %
Lymphocytes Relative: 31 %
Lymphs Abs: 2.5 10*3/uL (ref 1.0–3.6)
Monocytes Absolute: 0.6 10*3/uL (ref 0.2–0.9)
Monocytes Relative: 8 %
Neutro Abs: 4.6 10*3/uL (ref 1.4–6.5)
Neutrophils Relative %: 59 %

## 2017-01-05 LAB — ETHANOL: Alcohol, Ethyl (B): <10 mg/dL (ref <10)

## 2017-01-05 LAB — CBC
HCT: 39.4 % (ref 35.0–47.0)
Hemoglobin: 13.3 g/dL (ref 12.0–16.0)
MCH: 30.5 pg (ref 26.0–34.0)
MCHC: 33.7 g/dL (ref 32.0–36.0)
MCV: 90.5 fL (ref 80.0–100.0)
Platelets: 191 10*3/uL (ref 150–440)
RBC: 4.36 MIL/uL (ref 3.80–5.20)
RDW: 13.6 % (ref 11.5–14.5)
WBC: 8 10*3/uL (ref 3.6–11.0)

## 2017-01-05 LAB — PROTIME-INR
INR: 0.96
Prothrombin Time: 12.7 seconds (ref 11.4–15.2)

## 2017-01-05 LAB — TROPONIN I: Troponin I: 0.13 ng/mL (ref <0.03)

## 2017-01-05 LAB — APTT: aPTT: 36 seconds (ref 24–36)

## 2017-01-05 MED ORDER — ALTEPLASE 100 MG IV SOLR
INTRAVENOUS | Status: AC
Start: 2017-01-05 — End: 2017-01-06
  Filled 2017-01-05: qty 100

## 2017-01-05 MED ORDER — ALTEPLASE (STROKE) FULL DOSE INFUSION: 0.9000 mg/kg | Freq: Once | INTRAVENOUS | Status: DC

## 2017-01-05 MED ORDER — SODIUM CHLORIDE 0.9 % IV SOLN: 50.0000 mL | Freq: Once | INTRAVENOUS | Status: DC

## 2017-01-05 NOTE — ED Provider Notes (Signed)
[facility] Emergency Department Provider Note  ____________________________________________   First MD Initiated Contact with Patient 01/05/17 2332     (approximate)  I have reviewed the triage vital signs and the nursing notes.   HISTORY  Chief Complaint Code Stroke   HPI Angel BRUMMET is a 66 y.o. female who comes to the emergency department with severe sudden onset left arm and left leg weakness that began roughly at 11 PM today about 20 minutes prior to arrival.  She was in the bathroom and when she came out she told her husband that she could not walk in her left arm felt heavy and numb.  Symptoms began suddenly and have been slowly progressive.  She takes no chronic medications.  She has never had a stroke or heart attack before.  She has no pain.  Her husband said her face "does not look right".  The patient is able to speak.  She has no visual issues.  Past Medical History:  Diagnosis Date  . Hypertension     There are no active problems to display for this patient.   Past Surgical History:  Procedure Laterality Date  . ABDOMINAL SURGERY    . CHOLECYSTECTOMY      Prior to Admission medications   Medication Sig Start Date End Date Taking? Authorizing Provider  HYDROcodone-acetaminophen (NORCO) 5-325 MG tablet Take 1 tablet by mouth every 6 (six) hours as needed for moderate pain. Patient not taking: Reported on 01/06/2017 07/28/15   Daymon Larsen, MD  ibuprofen (ADVIL,MOTRIN) 400 MG tablet Take 1 tablet (400 mg total) by mouth every 6 (six) hours as needed. Patient not taking: Reported on 01/06/2017 07/28/15   Daymon Larsen, MD  LORazepam (ATIVAN) 1 MG tablet Take 1 tablet (1 mg total) by mouth every 8 (eight) hours as needed (muscle spasm). Patient not taking: Reported on 01/06/2017 07/28/15   Daymon Larsen, MD    Allergies Penicillins  No family history on file.  Social History Social History  Substance Use Topics  . Smoking  status: Current Some Day Smoker    Packs/day: 1.00    Types: Cigarettes  . Smokeless tobacco: Never Used  . Alcohol use No    Review of Systems Constitutional: No fever/chills Eyes: No visual changes. ENT: No sore throat. Cardiovascular: Denies chest pain. Respiratory: Denies shortness of breath. Gastrointestinal: No abdominal pain.  No nausea, no vomiting.  No diarrhea.  No constipation. Genitourinary: Negative for dysuria. Musculoskeletal: Negative for back pain. Skin: Negative for rash. Neurological: Negative for headaches, focal weakness or numbness.   ____________________________________________   PHYSICAL EXAM:  VITAL SIGNS: ED Triage Vitals  Enc Vitals Group     BP      Pulse      Resp      Temp      Temp src      SpO2      Weight      Height      Head Circumference      Peak Flow      Pain Score      Pain Loc      Pain Edu?      Excl. in [place]?     Constitutional: Alert and oriented x4 tearful no diaphoresis speaks in full clear sentences Eyes: PERRL EOMI. Head: Atraumatic. Nose: No congestion/rhinnorhea. Mouth/Throat: No trismus Neck: No stridor.   Cardiovascular: Normal rate, regular rhythm. Grossly normal heart sounds.  Good peripheral circulation. Respiratory: Normal respiratory  effort.  No retractions. Lungs CTAB and moving good air Gastrointestinal: Soft nontender Musculoskeletal: No lower extremity edema   Neurologic:   Some left facial droop 1 out of 5 strength left upper extremity normal strength right upper extremity 1 out of 5 strength left lower extremity normal strength right lower extremity Skin:  Skin is warm, dry and intact. No rash noted. Psychiatric: Mood and affect are normal. Speech and behavior are normal.    ____________________________________________   DIFFERENTIAL includes but not limited to  Stroke, TIA, complex migraine, intracerebral hemorrhage ____________________________________________   LABS (all labs ordered  are listed, but only abnormal results are displayed)  Labs Reviewed  COMPREHENSIVE METABOLIC PANEL - Abnormal; Notable for the following:       Result Value   Glucose, Bld 109 (*)    All other components within normal limits  TROPONIN I - Abnormal; Notable for the following:    Troponin I 0.13 (*)    All other components within normal limits  ETHANOL  PROTIME-INR  APTT  CBC  DIFFERENTIAL  URINE DRUG SCREEN, QUALITATIVE ([facility] ONLY)  URINALYSIS, ROUTINE W REFLEX MICROSCOPIC    Blood work reviewed and interpreted by me shows elevated troponin likely secondary to demand and not true primary myocardial ischemia __________________________________________  EKG  ED ECG REPORT I, Darel Hong, the attending physician, personally viewed and interpreted this ECG.  Date: 01/05/2017 EKG Time:  Rate: 88 Rhythm: normal sinus rhythm QRS Axis: Leftward Intervals: normal ST/T Wave abnormalities: normal Narrative Interpretation: no evidence of acute ischemia  ____________________________________________  RADIOLOGY  CT head reviewed by me with no acute disease ____________________________________________   PROCEDURES  Procedure(s) performed: no  Procedures  Critical Care performed: yes  CRITICAL CARE Performed by: Darel Hong   Total critical care time: 40 minutes  Critical care time was exclusive of separately billable procedures and treating other patients.  Critical care was necessary to treat or prevent imminent or life-threatening deterioration.  Critical care was time spent personally by me on the following activities: development of treatment plan with patient and/or surrogate as well as nursing, discussions with consultants, evaluation of patient's response to treatment, examination of patient, obtaining history from patient or surrogate, ordering and performing treatments and interventions, ordering and review of laboratory studies, ordering and review of  radiographic studies, pulse oximetry and re-evaluation of patient's condition.   Observation: no ____________________________________________   INITIAL IMPRESSION / ASSESSMENT AND PLAN / ED COURSE  Pertinent labs & imaging results that were available during my care of the patient were reviewed by me and considered in my medical decision making (see chart for details).  Patient arrives with severe left arm and left leg weakness with the last known well time of 11 PM.  Concern for right-sided MCA stroke.  Code stroke called and she is taken immediately to the CT scanner.  First blood pressure was in the 160s.     ----------------------------------------- 11:51 PM on 01/05/2017 -----------------------------------------  I was called by radiology that the patient's head CT is negative for bleed.  I then ran through the TPA checklist with the patient and she has no contraindications to TPA.  Tele-neurology is at bedside but I have ordered TPA to be ready.  Her blood pressure remains safe for TPA administration.  She has no medications that need to be reversed. ____________________________________________   ----------------------------------------- 12:18 AM on 01/06/2017 -----------------------------------------  Neurology specialist on-call recommended TPA administration however the patient declined.  On reexamination now she has 4 out  of 5 strength in left lower and left upper extremities and given the rapidly improving symptoms this may very well be simply a transient ischemic attack.  324 of aspirin now and she will require inpatient admission.   FINAL CLINICAL IMPRESSION(S) / ED DIAGNOSES  Final diagnoses:  Acute ischemic stroke ([place])      NEW MEDICATIONS STARTED DURING THIS VISIT:  New Prescriptions   No medications on file     Note:  This document was prepared using Dragon voice recognition software and may include unintentional dictation errors.     Darel Hong, MD 01/06/17 [phone]

## 2017-01-05 NOTE — ED Notes (Signed)
Pt brought in by husband. Pt was noticed by husband limping from the bathroom. Upon further assessment husband noticed left sided droop. Husband states symptoms were first noticed at 2300. Charge nurse and MD made aware.

## 2017-01-05 NOTE — ED Triage Notes (Signed)
Patient's spouse reports at 58 today patient began to have left-sided weakness, facial droop, and mild confusion. Confusion has since resolved.

## 2017-01-06 ENCOUNTER — Inpatient Hospital Stay (HOSPITAL_COMMUNITY)
Admit: 2017-01-06 | Discharge: 2017-01-06 | Disposition: A | Discharge status: Home / Self Care | Payer: Medicare Other | Attending: Internal Medicine | Admitting: Internal Medicine

## 2017-01-06 ENCOUNTER — Encounter: Payer: Self-pay | Admitting: Internal Medicine

## 2017-01-06 ENCOUNTER — Observation Stay: Payer: Medicare Other

## 2017-01-06 DIAGNOSIS — I10 Essential (primary) hypertension: Secondary | ICD-10-CM | POA: Diagnosis present

## 2017-01-06 DIAGNOSIS — Z9114 Patient's other noncompliance with medication regimen: Secondary | ICD-10-CM | POA: Diagnosis not present

## 2017-01-06 DIAGNOSIS — I639 Cerebral infarction, unspecified: Secondary | ICD-10-CM | POA: Diagnosis present

## 2017-01-06 DIAGNOSIS — Z8673 Personal history of transient ischemic attack (TIA), and cerebral infarction without residual deficits: Secondary | ICD-10-CM | POA: Diagnosis not present

## 2017-01-06 DIAGNOSIS — R778 Other specified abnormalities of plasma proteins: Secondary | ICD-10-CM | POA: Diagnosis present

## 2017-01-06 DIAGNOSIS — I351 Nonrheumatic aortic (valve) insufficiency: Secondary | ICD-10-CM

## 2017-01-06 DIAGNOSIS — R29705 NIHSS score 5: Secondary | ICD-10-CM | POA: Diagnosis present

## 2017-01-06 DIAGNOSIS — R270 Ataxia, unspecified: Secondary | ICD-10-CM | POA: Diagnosis present

## 2017-01-06 DIAGNOSIS — R29810 Facial weakness: Secondary | ICD-10-CM | POA: Diagnosis present

## 2017-01-06 DIAGNOSIS — G8194 Hemiplegia, unspecified affecting left nondominant side: Secondary | ICD-10-CM | POA: Diagnosis present

## 2017-01-06 DIAGNOSIS — Z880 Allergy status to penicillin: Secondary | ICD-10-CM | POA: Diagnosis not present

## 2017-01-06 DIAGNOSIS — I69354 Hemiplegia and hemiparesis following cerebral infarction affecting left non-dominant side: Secondary | ICD-10-CM

## 2017-01-06 DIAGNOSIS — F17210 Nicotine dependence, cigarettes, uncomplicated: Secondary | ICD-10-CM | POA: Diagnosis present

## 2017-01-06 DIAGNOSIS — I6381 Other cerebral infarction due to occlusion or stenosis of small artery: Secondary | ICD-10-CM | POA: Diagnosis present

## 2017-01-06 DIAGNOSIS — R7989 Other specified abnormal findings of blood chemistry: Secondary | ICD-10-CM

## 2017-01-06 HISTORY — DX: Cerebral infarction, unspecified: I63.9

## 2017-01-06 HISTORY — DX: Hemiplegia and hemiparesis following cerebral infarction affecting left non-dominant side: I69.354

## 2017-01-06 LAB — LIPID PANEL
Cholesterol: 167 mg/dL (ref 0–200)
HDL: 50 mg/dL (ref >40)
LDL Cholesterol: 107 mg/dL — ABNORMAL HIGH (ref 0–99)
Total CHOL/HDL Ratio: 3.3 RATIO
Triglycerides: 49 mg/dL (ref <150)
VLDL: 10 mg/dL (ref 0–40)

## 2017-01-06 LAB — URINE DRUG SCREEN, QUALITATIVE (ARMC ONLY)
Amphetamines, Ur Screen: NOT DETECTED
Barbiturates, Ur Screen: NOT DETECTED
Benzodiazepine, Ur Scrn: NOT DETECTED
Cannabinoid 50 Ng, Ur ~~LOC~~: NOT DETECTED
Cocaine Metabolite,Ur ~~LOC~~: NOT DETECTED
MDMA (Ecstasy)Ur Screen: NOT DETECTED
Methadone Scn, Ur: NOT DETECTED
Opiate, Ur Screen: NOT DETECTED
Phencyclidine (PCP) Ur S: NOT DETECTED
Tricyclic, Ur Screen: NOT DETECTED

## 2017-01-06 LAB — HEMOGLOBIN A1C
Hgb A1c MFr Bld: 5.8 % — ABNORMAL HIGH (ref 4.8–5.6)
Mean Plasma Glucose: 119.76 mg/dL

## 2017-01-06 LAB — URINALYSIS, ROUTINE W REFLEX MICROSCOPIC
Bacteria, UA: NONE SEEN
Bilirubin Urine: NEGATIVE
Glucose, UA: NEGATIVE mg/dL
Ketones, ur: NEGATIVE mg/dL
Leukocytes, UA: NEGATIVE
Nitrite: NEGATIVE
Protein, ur: NEGATIVE mg/dL
Specific Gravity, Urine: 1.013 (ref 1.005–1.030)
pH: 7 (ref 5.0–8.0)

## 2017-01-06 LAB — CBC
HCT: 38.8 % (ref 35.0–47.0)
Hemoglobin: 13 g/dL (ref 12.0–16.0)
MCH: 30.3 pg (ref 26.0–34.0)
MCHC: 33.5 g/dL (ref 32.0–36.0)
MCV: 90.4 fL (ref 80.0–100.0)
Platelets: 192 10*3/uL (ref 150–440)
RBC: 4.3 MIL/uL (ref 3.80–5.20)
RDW: 13.5 % (ref 11.5–14.5)
WBC: 6.2 10*3/uL (ref 3.6–11.0)

## 2017-01-06 LAB — CREATININE, SERUM
Creatinine, Ser: 0.69 mg/dL (ref 0.44–1.00)
GFR calc Af Amer: >60 mL/min (ref >60)
GFR calc non Af Amer: >60 mL/min (ref >60)

## 2017-01-06 MED ORDER — ACETAMINOPHEN 325 MG PO TABS: 650.0000 mg | ORAL_TABLET | ORAL | Status: DC | PRN

## 2017-01-06 MED ORDER — FLUTICASONE PROPIONATE 50 MCG/ACT NA SUSP
2.0000 | Freq: Every day | NASAL | Status: DC
  Administered 2017-01-06 – 2017-01-08 (×3): 2 via NASAL
  Filled 2017-01-06: qty 16

## 2017-01-06 MED ORDER — STROKE: EARLY STAGES OF RECOVERY BOOK
Freq: Once | Status: AC
  Administered 2017-01-06: 04:00:00

## 2017-01-06 MED ORDER — ACETAMINOPHEN 160 MG/5ML PO SOLN: 650.0000 mg | ORAL | Status: DC | PRN

## 2017-01-06 MED ORDER — ENOXAPARIN SODIUM 40 MG/0.4ML ~~LOC~~ SOLN
40.0000 mg | SUBCUTANEOUS | Status: DC
  Administered 2017-01-06 – 2017-01-07 (×2): 40 mg via SUBCUTANEOUS
  Filled 2017-01-06 (×2): qty 0.4

## 2017-01-06 MED ORDER — METOPROLOL TARTRATE 25 MG PO TABS
25.0000 mg | ORAL_TABLET | Freq: Two times a day (BID) | ORAL | Status: DC
  Administered 2017-01-06 – 2017-01-08 (×5): 25 mg via ORAL
  Filled 2017-01-06 (×5): qty 1

## 2017-01-06 MED ORDER — SODIUM CHLORIDE 0.9% FLUSH
3.0000 mL | Freq: Two times a day (BID) | INTRAVENOUS | Status: DC
  Administered 2017-01-06 – 2017-01-08 (×5): 3 mL via INTRAVENOUS

## 2017-01-06 MED ORDER — ASPIRIN 81 MG PO CHEW
324.0000 mg | CHEWABLE_TABLET | Freq: Once | ORAL | Status: AC
  Administered 2017-01-06: 324 mg via ORAL
  Filled 2017-01-06: qty 4

## 2017-01-06 MED ORDER — NICOTINE 21 MG/24HR TD PT24
21.0000 mg | MEDICATED_PATCH | Freq: Every day | TRANSDERMAL | Status: DC
  Administered 2017-01-06 – 2017-01-08 (×3): 21 mg via TRANSDERMAL
  Filled 2017-01-06 (×3): qty 1

## 2017-01-06 MED ORDER — ASPIRIN EC 81 MG PO TBEC
81.0000 mg | DELAYED_RELEASE_TABLET | Freq: Every day | ORAL | Status: DC
  Administered 2017-01-06 – 2017-01-07 (×2): 81 mg via ORAL
  Filled 2017-01-06 (×2): qty 1

## 2017-01-06 MED ORDER — ACETAMINOPHEN 650 MG RE SUPP: 650.0000 mg | RECTAL | Status: DC | PRN

## 2017-01-06 MED ORDER — SODIUM CHLORIDE 0.9% FLUSH
3.0000 mL | INTRAVENOUS | Status: DC | PRN
  Administered 2017-01-06 – 2017-01-07 (×2): 3 mL via INTRAVENOUS
  Filled 2017-01-06 (×2): qty 3

## 2017-01-06 MED ORDER — ATORVASTATIN CALCIUM 20 MG PO TABS
40.0000 mg | ORAL_TABLET | Freq: Every day | ORAL | Status: DC
  Administered 2017-01-06 – 2017-01-07 (×2): 40 mg via ORAL
  Filled 2017-01-06 (×2): qty 2

## 2017-01-06 NOTE — Progress Notes (Signed)
   [facility] TeleNeurology Consult Services  Impression:  Stroke  Likely right hemispheric posterior circulation/brainstem. Patient has no contraindications to tpa, offered therapy but declined due to risk of bleeding.  Gantt initial negative. Given ataxia would obtain CTA head and neck as well as perfusion.   Differential Diagnosis:   1. Cardioembolic stroke  2. Small vessel disease/lacune  3. Thromboembolic, artery-to-artery mechanism  4. Hypercoagulable state-related infarct  5. Transient ischemic attack  6. Thrombotic mechanism, large artery disease   Comments:   [facility] contacted: 23:51 [facility] at bedside: 23:59 NIHSS assessment time: 00:08  Recommendations:  Antithrombotic and MRI brain Admission for routine stroke workup Inpatient neurology consultation Inpatient stroke evaluation as per Neurology/ Internal Medicine Discussed with ED MD Please call with questions  -----------------------------------------------------------------------------------------  CC  History of Present Illness   Patient is a  66 yr old woman with PMH of HTN, and recent cellulitis  Who presents with left sided deficits. She woke up and walked to the bathroom but as she was coming out the husband noted her to be very unsteady and had difficulty lifting her left foot. It felt heavy. She likewise noted that her left arm was heavy and weak as well. She is feeling slightly dizzy but denies speech deficits, headaches, chest pain or SOB. She does endorse smoking.  No hx of recent surgeries, liver disease or bleeding.   Diagnostic:  Exam: 1a- LOC: Keenly responsive - 0   1b- LOC questions: Answers both questions correctly - 0     1c- LOC commands- Performs both tasks correctly- 0     2- Gaze: Normal; no gaze paresis or gaze deviation - 0     3- Visual Fields: normal, no Visual field deficit - 0     4- Facial movements: left facial palsy - 1     5- Upper limb motor - left drift -1     6- Lower limb motor - left drift - 1      7- Limb Coordination: 2 limb ataxia - 2      8- Sensory : no sensory loss - 0      9- Language - No aphasia - 0      10- Speech - No dysarthria -0     11- Neglect / Extinction - none found -0     NIHSS score   5     Medical Decision Making:  - Extensive number of diagnosis or management options are considered above.   - Extensive amount of complex data reviewed.   - High risk of complication and/or morbidity or mortality are associated with differential diagnostic considerations above.  - There may be Uncertain outcome and increased probability of prolonged functional impairment or high probability of severe prolonged functional impairment associated with some of these differential diagnosis.  Medical Data Reviewed:  1.Data reviewed include clinical labs, radiology,  Medical Tests;   2.Tests results discussed w/performing or interpreting physician;   3.Obtaining/reviewing old medical records;  4.Obtaining case history from another source;  5.Independent review of image, tracing or specimen.    Patient was informed the Neurology Consult would happen via telehealth (remote video) and consented to receiving care in this manner.

## 2017-01-06 NOTE — Progress Notes (Signed)
Speech Therapy Note: received order, reviewed chart notes and consulted w/ NSG then met w/ pt and family present(multiple members).  Pt was alert/oriented x3, verbally conversive w/ SLP and family. Pt's speech was 100% intelligible but noted slight asymmetry of Left upper labial area. Strength and ROM appeared wfl during tasks of sipping liquids via straw and speech. Pt is also c/o significant Left facial and nasal tightness from moderate+ congestion in Left nose; NSG aware. Pt denied any speech deficits herself; family felt the same stating her "mouth is so much better now". Pt and NSG deny any difficulty swallowing; pt is on a regular diet.  Discussed role of SLP and ST services w/ regard to any potential dysarthria, oral motor weakness. Discussed the importance of pt getting some rest post a stroke and suggested scheduled visiting times since pt has such an extensive family. Family understood. ST services will f/u w/ pt on Monday for any needed assessment or services. NSG updated.     Orinda Kenner, [place], CCC-SLP

## 2017-01-06 NOTE — ED Notes (Signed)
Tele-neurologist on screen/ assessment began

## 2017-01-06 NOTE — ED Notes (Signed)
ED Provider at bedside. 

## 2017-01-06 NOTE — Consult Note (Signed)
Referring Physician: Anselm Jungling    Chief Complaint: Left sided weakness  HPI: Angel Lane is an 66 y.o. female with a history of hypertension, noncompliant with medications, who reports that she went to be went to bed at baseline on the night prior to admission.  Awakened early in the morning and on going to the bathroom was noted that her left side was weak.  Alerted family members and patient was brought to the emergency room for evaluation.  Initial NIH stroke scale of 3.  Patient was offered TPA but refused.  Date last known well: Date: 01/05/2017 Time last known well: Time: 21:30 tPA Given: No: Patient refused  Past Medical History:  Diagnosis Date  . Hypertension     Past Surgical History:  Procedure Laterality Date  . ABDOMINAL SURGERY    . CHOLECYSTECTOMY      Family History  Problem Relation Age of Onset  . Family history unknown: Yes   Social History:  reports that she has been smoking Cigarettes.  She has a 40.00 pack-year smoking history. She has never used smokeless tobacco. She reports that she does not drink alcohol or use drugs.  Allergies:  Allergies  Allergen Reactions  . Penicillins Other (See Comments)    chills    Medications:  I have reviewed the patient's current medications. Prior to Admission:  Prescriptions Prior to Admission  Medication Sig Dispense Refill Last Dose  . hydrochlorothiazide (HYDRODIURIL) 25 MG tablet Take 25 mg by mouth daily.     Marland Kitchen HYDROcodone-acetaminophen (NORCO) 5-325 MG tablet Take 1 tablet by mouth every 6 (six) hours as needed for moderate pain. (Patient not taking: Reported on 01/06/2017) 20 tablet 0 Not Taking at Unknown time  . ibuprofen (ADVIL,MOTRIN) 400 MG tablet Take 1 tablet (400 mg total) by mouth every 6 (six) hours as needed. (Patient not taking: Reported on 01/06/2017) 30 tablet 0 Not Taking at Unknown time  . LORazepam (ATIVAN) 1 MG tablet Take 1 tablet (1 mg total) by mouth every 8 (eight) hours as needed  (muscle spasm). (Patient not taking: Reported on 01/06/2017) 10 tablet 0 Not Taking at Unknown time   Scheduled: . aspirin EC  81 mg Oral Daily  . atorvastatin  40 mg Oral q1800  . enoxaparin (LOVENOX) injection  40 mg Subcutaneous Q24H  . metoprolol tartrate  25 mg Oral BID  . nicotine  21 mg Transdermal Daily  . sodium chloride flush  3 mL Intravenous Q12H    ROS: History obtained from the patient  General ROS: negative for - chills, fatigue, fever, night sweats, weight gain or weight loss Psychological ROS: negative for - behavioral disorder, hallucinations, memory difficulties, mood swings or suicidal ideation Ophthalmic ROS: negative for - blurry vision, double vision, eye pain or loss of vision ENT ROS: negative for - epistaxis, nasal discharge, oral lesions, sore throat, tinnitus or vertigo Allergy and Immunology ROS: negative for - hives or itchy/watery eyes Hematological and Lymphatic ROS: negative for - bleeding problems, bruising or swollen lymph nodes Endocrine ROS: negative for - galactorrhea, hair pattern changes, polydipsia/polyuria or temperature intolerance Respiratory ROS: negative for - cough, hemoptysis, shortness of breath or wheezing Cardiovascular ROS: negative for - chest pain, dyspnea on exertion, edema or irregular heartbeat Gastrointestinal ROS: negative for - abdominal pain, diarrhea, hematemesis, nausea/vomiting or stool incontinence Genito-Urinary ROS: negative for - dysuria, hematuria, incontinence or urinary frequency/urgency Musculoskeletal ROS: negative for - joint swelling or muscular weakness Neurological ROS: as noted in HPI Dermatological ROS: negative  for rash and skin lesion changes  Physical Examination: Blood pressure (!) 151/81, pulse 70, temperature 98.2 F (36.8 C), temperature source Oral, resp. rate 20, height 5\' 5"  (1.651 m), weight 80.3 kg (177 lb 1.6 oz), SpO2 100 %.  HEENT-  Normocephalic, no lesions, without obvious abnormality.   Normal external eye and conjunctiva.  Normal TM's bilaterally.  Normal auditory canals and external ears. Normal external nose, mucus membranes and septum.  Normal pharynx. Cardiovascular- S1, S2 normal, pulses palpable throughout   Lungs- chest clear, no wheezing, rales, normal symmetric air entry Abdomen- soft, non-tender; bowel sounds normal; no masses,  no organomegaly Extremities- no edema Lymph-no adenopathy palpable Musculoskeletal-no joint tenderness, deformity or swelling Skin-warm and dry, no hyperpigmentation, vitiligo, or suspicious lesions  Neurological Examination   Mental Status: Alert, oriented, thought content appropriate.  Speech fluent without evidence of aphasia.  Dysarthric.  Able to follow 3 step commands without difficulty. Cranial Nerves: II: Discs flat bilaterally; Visual fields grossly normal, pupils equal, round, reactive to light and accommodation III,IV, VI: ptosis not present, extra-ocular motions intact bilaterally V,VII: Left facial droop, facial light touch sensation normal bilaterally VIII: hearing normal bilaterally IX,X: gag reflex present XI: bilateral shoulder shrug XII: midline tongue extension Motor: Right : Upper extremity   5/5    Left:     Upper extremity   3+/5  Lower extremity   5/5     Lower extremity   5-/5 Tone and bulk:normal tone throughout; no atrophy noted Sensory: Pinprick and light touch intact throughout, bilaterally Deep Tendon Reflexes: 2+ and symmetric throughout Plantars: Right: downgoing   Left: downgoing Cerebellar: Normal finger-to-nose and normal heel-to-shin testing bilaterally Gait: not tested due to safety concerns    Laboratory Studies:  Basic Metabolic Panel:  Recent Labs Lab 01/05/17 2328 01/06/17 0643  NA 138  --   K 3.7  --   CL 106  --   CO2 26  --   GLUCOSE 109*  --   BUN 20  --   CREATININE 0.77 0.69  CALCIUM 9.2  --     Liver Function Tests:  Recent Labs Lab 01/05/17 2328  AST 23  ALT 16   ALKPHOS 81  BILITOT 0.7  PROT 7.2  ALBUMIN 3.7   No results for input(s): LIPASE, AMYLASE in the last 168 hours. No results for input(s): AMMONIA in the last 168 hours.  CBC:  Recent Labs Lab 01/05/17 2328 01/06/17 0643  WBC 8.0 6.2  NEUTROABS 4.6  --   HGB 13.3 13.0  HCT 39.4 38.8  MCV 90.5 90.4  PLT 191 192    Cardiac Enzymes:  Recent Labs Lab 01/05/17 2328  TROPONINI 0.13*    BNP: Invalid input(s): POCBNP  CBG: No results for input(s): GLUCAP in the last 168 hours.  Microbiology: No results found for this or any previous visit.  Coagulation Studies:  Recent Labs  01/05/17 2328  LABPROT 12.7  INR 0.96    Urinalysis:  Recent Labs Lab 01/05/17 2328  COLORURINE YELLOW*  LABSPEC 1.013  PHURINE 7.0  GLUCOSEU NEGATIVE  HGBUR SMALL*  BILIRUBINUR NEGATIVE  KETONESUR NEGATIVE  PROTEINUR NEGATIVE  NITRITE NEGATIVE  LEUKOCYTESUR NEGATIVE    Lipid Panel:    Component Value Date/Time   CHOL 167 01/06/2017 0643   TRIG 49 01/06/2017 0643   HDL 50 01/06/2017 0643   CHOLHDL 3.3 01/06/2017 0643   VLDL 10 01/06/2017 0643   LDLCALC 107 (H) 01/06/2017 0643    HgbA1C: No results found  for: HGBA1C  Urine Drug Screen:     Component Value Date/Time   LABOPIA NONE DETECTED 01/05/2017 2328   COCAINSCRNUR NONE DETECTED 01/05/2017 2328   LABBENZ NONE DETECTED 01/05/2017 2328   AMPHETMU NONE DETECTED 01/05/2017 2328   THCU NONE DETECTED 01/05/2017 2328   LABBARB NONE DETECTED 01/05/2017 2328    Alcohol Level:  Recent Labs Lab 01/05/17 [place] <10    Other results: EKG: Sinus rhythm at 88 bpm.  Imaging: Mr [name] Head Wo Contrast  Result Date: 01/06/2017 CLINICAL DATA:  Severe onset LEFT arm and LEFT leg weakness beginning at 11 p.m. tonight. History of hypertension. EXAM: MRI HEAD WITHOUT CONTRAST MRA HEAD WITHOUT CONTRAST TECHNIQUE: Multiplanar, multiecho pulse sequences of the brain and surrounding structures were obtained without intravenous  contrast. Angiographic images of the head were obtained using MRA technique without contrast. COMPARISON:  CT HEAD January 05, 2017 FINDINGS: MRI HEAD FINDINGS BRAIN: 9 x 6 x 13 mm reduced diffusion RIGHT lenticulostriate nucleus with low ADC values. No susceptibility artifact to suggest hemorrhage. Ventricles and sulci are normal for patient's age. Patchy supratentorial white matter FLAIR T2 hyperintensities. 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 No midline shift, mass effect or masses. VASCULAR: Normal major intracranial vascular flow voids present at skull base. SKULL AND UPPER CERVICAL SPINE: No abnormal sellar expansion. No suspicious calvarial bone marrow signal. Craniocervical junction maintained. SINUSES/ORBITS: Small bilateral maxillary mucosal retention cysts, mild paranasal sinus mucosal thickening. Mastoid air cells are well aerated. The included ocular globes and orbital contents are non-suspicious. OTHER: None. MRA HEAD FINDINGS- Mild motion degraded examination. ANTERIOR CIRCULATION: Flow related enhancement of the included cervical, petrous, cavernous and supraclinoid internal carotid arteries. Slight luminal irregularity LEFT proximal cavernous segment most compatible with tortuosity in artifact. Moderate stenosis LEFT carotid terminus to proximal LEFT MCA. 1-2 mm inferiorly directed out patching bilateral supraclinoid internal carotid artery's. Patent anterior communicating artery. Patent anterior and middle cerebral arteries, including distal segments. However, there is less robust LEFT middle cerebral artery flow related enhancement as compared to the RIGHT. No large vessel occlusion, flow limiting stenosis, aneurysm. POSTERIOR CIRCULATION: LEFT vertebral artery is dominant. Basilar artery is patent, with normal flow related enhancement of the main branch vessels. Patent posterior cerebral arteries. Severe stenosis LEFT P1 origin and proximal RIGHT P2 segment.  Severe stenosis proximal P3 segment. No large vessel occlusion, flow limiting stenosis,  aneurysm. ANATOMIC VARIANTS: Hypoplastic LEFT A1 segment. Source images and MIP images were reviewed. IMPRESSION: MRI HEAD: 1. 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 2. 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 Old small LEFT cerebellar infarct. 3. Moderate chronic small vessel ischemic disease. MRA HEAD: 1. No emergent large vessel occlusion on this mildly motion degraded examination. 2. Moderate stenosis LEFT carotid terminus to proximal MCA. Slightly slower flow LEFT middle cerebral artery. 3. Severe stenosis bilateral posterior cerebral artery's most compatible with atherosclerosis. 4. 2 mm outpouching bilateral supraclinoid internal carotid artery's seen with posterior communicating artery infundibula versus aneurysms. Electronically Signed   By: Elon Alas M.D.   On: 01/06/2017 02:44   Mr Brain Wo Contrast (neuro Protocol)  Result Date: 01/06/2017 CLINICAL DATA:  Severe onset LEFT arm and LEFT leg weakness beginning at 11 p.m. tonight. History of hypertension. EXAM: MRI HEAD WITHOUT CONTRAST MRA HEAD WITHOUT CONTRAST TECHNIQUE: Multiplanar, multiecho pulse sequences of the brain and surrounding structures were obtained without intravenous contrast. Angiographic images of the head were obtained using MRA  technique without contrast. COMPARISON:  CT HEAD January 05, 2017 FINDINGS: MRI HEAD FINDINGS BRAIN: 9 x 6 x 13 mm reduced diffusion RIGHT lenticulostriate nucleus with low ADC values. No susceptibility artifact to suggest hemorrhage. Ventricles and sulci are normal for patient's age. Patchy supratentorial white matter FLAIR T2 hyperintensities. 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 No midline shift, mass effect or masses. VASCULAR: Normal major intracranial vascular flow voids present at skull base. SKULL AND UPPER CERVICAL SPINE: No abnormal sellar expansion. No  suspicious calvarial bone marrow signal. Craniocervical junction maintained. SINUSES/ORBITS: Small bilateral maxillary mucosal retention cysts, mild paranasal sinus mucosal thickening. Mastoid air cells are well aerated. The included ocular globes and orbital contents are non-suspicious. OTHER: None. MRA HEAD FINDINGS- Mild motion degraded examination. ANTERIOR CIRCULATION: Flow related enhancement of the included cervical, petrous, cavernous and supraclinoid internal carotid arteries. Slight luminal irregularity LEFT proximal cavernous segment most compatible with tortuosity in artifact. Moderate stenosis LEFT carotid terminus to proximal LEFT MCA. 1-2 mm inferiorly directed out patching bilateral supraclinoid internal carotid artery's. Patent anterior communicating artery. Patent anterior and middle cerebral arteries, including distal segments. However, there is less robust LEFT middle cerebral artery flow related enhancement as compared to the RIGHT. No large vessel occlusion, flow limiting stenosis, aneurysm. POSTERIOR CIRCULATION: LEFT vertebral artery is dominant. Basilar artery is patent, with normal flow related enhancement of the main branch vessels. Patent posterior cerebral arteries. Severe stenosis LEFT P1 origin and proximal RIGHT P2 segment. Severe stenosis proximal P3 segment. No large vessel occlusion, flow limiting stenosis,  aneurysm. ANATOMIC VARIANTS: Hypoplastic LEFT A1 segment. Source images and MIP images were reviewed. IMPRESSION: MRI HEAD: 1. 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 2. 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 Old small LEFT cerebellar infarct. 3. Moderate chronic small vessel ischemic disease. MRA HEAD: 1. No emergent large vessel occlusion on this mildly motion degraded examination. 2. Moderate stenosis LEFT carotid terminus to proximal MCA. Slightly slower flow LEFT middle cerebral artery. 3. Severe stenosis bilateral posterior cerebral artery's most compatible with  atherosclerosis. 4. 2 mm outpouching bilateral supraclinoid internal carotid artery's seen with posterior communicating artery infundibula versus aneurysms. Electronically Signed   By: Elon Alas M.D.   On: 01/06/2017 02:44   US Carotid Bilateral (at [facility] And [facility] Only)  Result Date: 01/06/2017 CLINICAL DATA:  66 year old female with a history of acute stroke. Cardiovascular risk factors include hypertension, known prior stroke/TIA, tobacco use EXAM: BILATERAL CAROTID DUPLEX ULTRASOUND TECHNIQUE: Pearline Cables scale imaging, color Doppler and duplex ultrasound were performed of bilateral carotid and vertebral arteries in the neck. COMPARISON:  No prior duplex FINDINGS: Criteria: Quantification of carotid stenosis is based on velocity parameters that correlate the residual internal carotid diameter with NASCET-based stenosis levels, using the diameter of the distal internal carotid lumen as the denominator for stenosis measurement. The following velocity measurements were obtained: RIGHT ICA:  Systolic 70 cm/sec, Diastolic 28 cm/sec CCA:  77 cm/sec SYSTOLIC ICA/CCA RATIO:  0.9 ECA:  52 cm/sec LEFT ICA:  Systolic 43 cm/sec, Diastolic 14 cm/sec CCA:  75 cm/sec SYSTOLIC ICA/CCA RATIO:  0.6 ECA:  70 cm/sec Right Brachial SBP: Not acquired Left Brachial SBP: Not acquired RIGHT CAROTID ARTERY: No significant calcified disease of the right common carotid artery. Intermediate waveform maintained. Heterogeneous plaque without significant calcifications at the right carotid bifurcation. Low resistance waveform of the right ICA. No significant tortuosity. RIGHT VERTEBRAL ARTERY: Antegrade flow with low resistance waveform. LEFT CAROTID  ARTERY: No significant calcified disease of the left common carotid artery. Intermediate waveform maintained. Heterogeneous plaque at the left carotid bifurcation without significant calcifications. Low resistance waveform of the left ICA. LEFT VERTEBRAL ARTERY:  Antegrade flow with low  resistance waveform. IMPRESSION: Color duplex indicates minimal heterogeneous plaque, with no hemodynamically significant stenosis by duplex criteria in the extracranial cerebrovascular circulation. Signed, Dulcy Fanny. Earleen Newport, DO [facility] Electronically Signed   By: Corrie Mckusick D.O.   On: 01/06/2017 15:31   Ct Head Code Stroke Wo Contrast  Result Date: 01/06/2017 CLINICAL DATA:  Code stroke. Acute onset LEFT-sided weakness and LEFT facial droop. EXAM: CT HEAD WITHOUT CONTRAST TECHNIQUE: Contiguous axial images were obtained from the base of the skull through the vertex without intravenous contrast. COMPARISON:  CT face December 23, 2016 FINDINGS: BRAIN: No intraparenchymal hemorrhage, mass effect nor midline shift. The ventricles and sulci are normal for age. Patchy supratentorial white matter hypodensities. No acute large vascular territory infarcts. No abnormal extra-axial fluid collections. Basal cisterns are patent. VASCULAR: Mild calcific atherosclerosis of the carotid siphons. Faintly dense intracranial vessels most compatible with hemoconcentration. SKULL: No skull fracture. Osteopenia. No significant scalp soft tissue swelling. SINUSES/ORBITS: The mastoid air-cells and included paranasal sinuses are well-aerated.The included ocular globes and orbital contents are non-suspicious. OTHER: None. ASPECTS [facility] Stroke Program Early CT Score) - Ganglionic level infarction (caudate, lentiform nuclei, internal capsule, insula, M1-M3 cortex): 7 - Supraganglionic infarction (M4-M6 cortex): 3 Total score (0-10 with 10 being normal): 10 IMPRESSION: 1. No acute intracranial process. 2. Moderate chronic small vessel ischemic disease. 3. ASPECTS is 10. Critical Value/emergent results were called by telephone at the time of interpretation on 01/05/2017 at 11:48 pm to Dr. Darel Hong , who verbally acknowledged these results. Electronically Signed   By:  Elon Alas M.D.   On: 01/06/2017 00:07    Assessment: 66 y.o. female with a history of hypertension, poorly compliant with medications, who presents with left-sided weakness.  Patient continues to have a left hemiparesis on examination.  MRI of the brain reviewed.  MRI shows an acute right basal ganglia infarct.  Suspect this is related to small vessel disease and poorly controlled hypertension.  MRI also notes old lacunar events as well, again in areas typically seen with hypertension.  Carotid dopplers show no evidence of hemodynamically significant stenosis.  Echocardiogram pending.  A1c pending, LDL 107.  Stroke Risk Factors - hypertension and smoking  Plan: 1. HgbA1c pending 2. PT consult, OT consult, Speech consult 3. Echocardiogram pending 4. Prophylactic therapy-Antiplatelet med: Aspirin - dose 325mg  daily 5. NPO until RN stroke swallow screen 6. Telemetry monitoring 7. Frequent neuro checks 8.  Medication compliance stressed. 9.  Statin for lipid management with target LDL<70. 10.  Smoking cessation counseling   Alexis Goodell, MD Neurology [phone] 01/06/2017, 4:08 PM

## 2017-01-06 NOTE — ED Notes (Signed)
Transport to floor [id number]

## 2017-01-06 NOTE — Progress Notes (Signed)
Left sided facial droop, slightly slurred speech, left arm and left leg weakness with left arm more weak than leg. Reports numbness above left lip and left cheek. Swallows without difficulty. Pt reports slight increase in strength/moved in left hand. Pt unsteady on feet with limited grip with walker use. Tearful at frequent intervals with emotional support given effective. VSS. Neuro checks now stable. Family in and very supportive.

## 2017-01-06 NOTE — Evaluation (Signed)
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    Clinical Impression   Pt seen for OT evaluation this date. Pt is 66 year old female who presents with decreased strength and coordination with gross and fine motor skills of LUE and hand, impaired balance, and impaired insight into deficits. Pt. unable to perform opposition using L hand with positive pronator drift noted. Pt had difficulty with fine motor coordination activities such as opening containers/packets on lunch tray and requires moderate assist for LB ADL tasks. Pt has impaired finger to nose test using LUE.  Proprioception and light touch are intact.  Pt would benefit from skilled OT services to address ADL training, fine motor skills training, adaptive equipment training, strengthening, and family ed and training.  Pt would benefit from CIR for continued rehab after discharge from hospital.    Follow Up Recommendations  CIR    Equipment Recommendations  Other (comment) (defer to next venue of care)    Recommendations for Other Services       Precautions / Restrictions Precautions Precautions: Fall Restrictions Weight Bearing Restrictions: No      Mobility Bed Mobility Overal bed mobility: Needs Assistance Bed Mobility: Supine to Sit;Sit to Supine     Supine to sit: Min guard Sit to supine: Min assist   General bed mobility comments: min assist back to bed for BLE, educated in hooking LLE with RLE to assist, verbal cues to adjust hips as pt did not correct positioning once back in bed with increased weightbearing on L side  Transfers Overall transfer level: Needs assistance Equipment used: Rolling walker (2 wheeled) Transfers: Sit to/from Stand Sit to Stand: Min assist         General transfer comment: verbal cues  to visually attend to LUE and able to bring to RW with additional time/effort and decreased coordination.     Balance Overall balance assessment: Needs assistance Sitting-balance support: Feet supported Sitting balance-Leahy Scale: Good     Standing balance support: Bilateral upper extremity supported Standing balance-Leahy Scale: Fair                             ADL either performed or assessed with clinical judgement   ADL Overall ADL's : Needs assistance/impaired Eating/Feeding: Sitting;Set up;Supervision/ safety   Grooming: Sitting;Set up   Upper Body Bathing: Sitting;Minimal assistance   Lower Body Bathing: Sitting/lateral leans;Sit to/from stand;Moderate assistance   Upper Body Dressing : Sitting;Minimal assistance   Lower Body Dressing: Sit to/from stand;Moderate assistance   Toilet Transfer: Ambulation;Minimal assistance;Min guard;Comfort height toilet;RW           Functional mobility during ADLs: Rolling walker;Minimal assistance;Min guard;Cueing for safety       Vision Baseline Vision/History: Wears glasses Wears Glasses: At all times Patient Visual Report: No change from baseline Vision Assessment?: No apparent visual deficits     Perception     Praxis      Pertinent Vitals/Pain Pain Assessment: No/denies pain     Hand Dominance Right   Extremity/Trunk Assessment Upper Extremity Assessment Upper Extremity Assessment: LUE deficits/detail (RUE WFL) LUE Deficits / Details: grip grossly 4-/5; biceps 4-/5; shoulder flexion 4-/5; intact sensation; unable to perform thumb opposition, impaired RAM and finger to nose testing, positive for drift LUE Coordination: decreased  fine motor;decreased gross motor   Lower Extremity Assessment Lower Extremity Assessment: Defer to PT evaluation;LLE deficits/detail LLE Deficits / Details: ankle DF/PF 4/5; quad/hamstrings 4-/5, intact sensation   Cervical / Trunk Assessment Cervical / Trunk Assessment:  Normal   Communication Communication Communication: No difficulties   Cognition Arousal/Alertness: Awake/alert Behavior During Therapy: WFL for tasks assessed/performed Overall Cognitive Status: Within Functional Limits for tasks assessed                                 General Comments: occasional verbal cues for safety, slightly impaired insight into deficits   General Comments       Exercises Other Exercises Other Exercises: Pt/spouse educated in LUE coordination exercises to perform throughout the day, encouraged pt to utilize LUE in bilateral tasks while in the bed. Both verbalized understanding. Other Exercises: Educated both briefly in role of CIR, and both verbalized understanding.   Shoulder Instructions      Home Living Family/patient expects to be discharged to:: Private residence Living Arrangements: Spouse/significant other Available Help at Discharge: Family Type of Home: House Home Access: Stairs to enter [employer] of Steps: 4 Entrance Stairs-Rails: Can reach both Home Layout: One level     Bathroom Shower/Tub: Walk-in shower;Tub/shower unit (uses walk in)   [employer]: Handicapped height     Home Equipment: Shower seat   Additional Comments: also has entrance through garage without railings      Prior Functioning/Environment Level of Independence: Independent        Comments: Pt indep with mobility, ADL, IADL including driving at baseline        OT Problem List: Decreased strength;Decreased coordination;Decreased safety awareness;Decreased activity tolerance;Impaired balance (sitting and/or standing);Impaired UE functional use;Decreased knowledge of use of DME or AE      OT Treatment/Interventions: Self-care/ADL training;Therapeutic exercise;Therapeutic activities;Patient/family education;DME and/or AE instruction;Balance training;Cognitive remediation/compensation;Neuromuscular education    OT Goals(Current  goals can be found in the care plan section) Acute Rehab OT Goals Patient Stated Goal: wants to go home OT Goal Formulation: With patient/family Time For Goal Achievement: 01/20/17 Potential to Achieve Goals: Good  OT Frequency: Min 3X/week   Barriers to D/C:            Co-evaluation              AM-PAC PT "6 Clicks" Daily Activity     Outcome Measure Help from another person eating meals?: A Little Help from another person taking care of personal grooming?: A Little Help from another person toileting, which includes using toliet, bedpan, or urinal?: A Little Help from another person bathing (including washing, rinsing, drying)?: A Lot Help from another person to put on and taking off regular upper body clothing?: A Little Help from another person to put on and taking off regular lower body clothing?: A Lot 6 Click Score: 16   End of Session Equipment Utilized During Treatment: Gait belt;Rolling walker  Activity Tolerance: Patient tolerated treatment well Patient left: in bed;with call bell/phone within reach;with bed alarm set;with family/visitor present  OT Visit Diagnosis: Other abnormalities of gait and mobility (R26.89);Hemiplegia and hemiparesis;Other symptoms and signs involving cognitive function Hemiplegia - Right/Left: Left Hemiplegia - dominant/non-dominant: Dominant (ambidextrous) Hemiplegia - caused by: Cerebral infarction                Time: 2595-6387 OT Time Calculation (min): 26 min Charges:  OT General Charges $OT Visit: 1 Visit OT Evaluation $  OT Eval Moderate Complexity: 1 Mod OT Treatments $Therapeutic Exercise: 8-22 mins G-Codes: OT G-codes **NOT FOR INPATIENT CLASS** Functional Assessment Tool Used: AM-PAC 6 Clicks Daily Activity;Clinical judgement Functional Limitation: Self care Self Care Current Status (Z5638): At least 40 percent but less than 60 percent impaired, limited or restricted Self Care Goal Status (V5643): At least 20 percent but  less than 40 percent impaired, limited or restricted   Jeni Salles, MPH, MS, OTR/L ascom [phone] 01/06/17, 4:08 PM

## 2017-01-06 NOTE — Progress Notes (Signed)
Rehab Admissions Coordinator Note:  Patient was screened by Retta Diones for appropriateness for an Inpatient Acute Rehab Consult.  Noted PT recommending inpatient rehab.  I will follow up with case manager on Monday.  Call me for questions.  Retta Diones 01/06/2017, 9:28 AM  I can be reached at [phone].

## 2017-01-06 NOTE — H&P (Signed)
[facility] [place] NAME: Angel Lane    MR#:  [id number]  DATE OF BIRTH:  [birth year]  DATE OF ADMISSION:  01/05/2017  PRIMARY CARE PHYSICIAN: Margarita Rana, MD   REQUESTING/REFERRING PHYSICIAN: Mable Paris, MD  CHIEF COMPLAINT:   Chief Complaint  Patient presents with  . Code Stroke    HISTORY OF PRESENT ILLNESS:  Angel Lane  is a 66 y.o. female who presents with left hemiparesis. Patient notices symptoms at home before laying down for bed, significant left upper and lower extremity weakness. Has been also states he may have noticed some facial alterations. Here in the ED she was offered TPA but refused to receive it. Symptoms have started to improve somewhat per her report, but her left arm is still significantly weak and has improved less than her left leg. Hospitalists were called for admission and further evaluation  PAST MEDICAL HISTORY:   Past Medical History:  Diagnosis Date  . Hypertension     PAST SURGICAL HISTORY:   Past Surgical History:  Procedure Laterality Date  . ABDOMINAL SURGERY    . CHOLECYSTECTOMY      SOCIAL HISTORY:   Social History  Substance Use Topics  . Smoking status: Current Some Day Smoker    Packs/day: 1.00    Types: Cigarettes  . Smokeless tobacco: Never Used  . Alcohol use No    FAMILY HISTORY:   Family History  Problem Relation Age of Onset  . Family history unknown: Yes    DRUG ALLERGIES:   Allergies  Allergen Reactions  . Penicillins Other (See Comments)    chills    MEDICATIONS AT HOME:   Prior to Admission medications   Medication Sig Start Date End Date Taking? Authorizing Provider  HYDROcodone-acetaminophen (NORCO) 5-325 MG tablet Take 1 tablet by mouth every 6 (six) hours as needed for moderate pain. Patient not taking: Reported on 01/06/2017 07/28/15   Daymon Larsen, MD  ibuprofen (ADVIL,MOTRIN) 400 MG tablet Take 1 tablet (400 mg total) by mouth  every 6 (six) hours as needed. Patient not taking: Reported on 01/06/2017 07/28/15   Daymon Larsen, MD  LORazepam (ATIVAN) 1 MG tablet Take 1 tablet (1 mg total) by mouth every 8 (eight) hours as needed (muscle spasm). Patient not taking: Reported on 01/06/2017 07/28/15   Daymon Larsen, MD    REVIEW OF SYSTEMS:  Review of Systems  Constitutional: Negative for chills, fever, malaise/fatigue and weight loss.  HENT: Negative for ear pain, hearing loss and tinnitus.   Eyes: Negative for blurred vision, double vision, pain and redness.  Respiratory: Negative for cough, hemoptysis and shortness of breath.   Cardiovascular: Negative for chest pain, palpitations, orthopnea and leg swelling.  Gastrointestinal: Negative for abdominal pain, constipation, diarrhea, nausea and vomiting.  Genitourinary: Negative for dysuria, frequency and hematuria.  Musculoskeletal: Negative for back pain, joint pain and neck pain.  Skin:       No acne, rash, or lesions  Neurological: Positive for focal weakness. Negative for dizziness, tremors and weakness.  Endo/Heme/Allergies: Negative for polydipsia. Does not bruise/bleed easily.  Psychiatric/Behavioral: Negative for depression. The patient is not nervous/anxious and does not have insomnia.      VITAL SIGNS:   Vitals:   01/05/17 2357 01/06/17 0000 01/06/17 0030 01/06/17 0030  BP:  (!) 183/99 (!) 187/103   Pulse:  82 79   Resp:  (!) 23 (!) 23   Temp:    99 F (37.2  C)  TempSrc:      SpO2:  94% 94%   Weight: 81.2 kg (179 lb)     Height: 5\' 5"  (1.651 m)      Wt Readings from Last 3 Encounters:  01/05/17 81.2 kg (179 lb)  12/23/16 81.2 kg (179 lb)  11/25/16 81.2 kg (179 lb)    PHYSICAL EXAMINATION:  Physical Exam  Vitals reviewed. Constitutional: She is oriented to person, place, and time. She appears well-developed and well-nourished. No distress.  HENT:  Head: Normocephalic and atraumatic.  Mouth/Throat: Oropharynx is clear and moist.  Eyes:  Pupils are equal, round, and reactive to light. Conjunctivae and EOM are normal. No scleral icterus.  Neck: Normal range of motion. Neck supple. No JVD present. No thyromegaly present.  Cardiovascular: Normal rate, regular rhythm and intact distal pulses.  Exam reveals no gallop and no friction rub.   No murmur heard. Respiratory: Effort normal and breath sounds normal. No respiratory distress. She has no wheezes. She has no rales.  GI: Soft. Bowel sounds are normal. She exhibits no distension. There is no tenderness.  Musculoskeletal: Normal range of motion. She exhibits no edema.  No arthritis, no gout  Lymphadenopathy:    She has no cervical adenopathy.  Neurological: She is alert and oriented to person, place, and time. No cranial nerve deficit.  Neurologic: Cranial nerves II-XII intact, Sensation intact to light touch/pinprick, 5/5 strength in right upper and right lower extremities, 3/5 strength in left upper extremity, 4/5 strength in left lower extremity, no dysarthria, no aphasia, no dysphagia, memory intact, pronator drift difficult to assess as she is not able to keep her left arm raised parallel to her right  Skin: Skin is warm and dry. No rash noted. No erythema.  Psychiatric: She has a normal mood and affect. Her behavior is normal. Judgment and thought content normal.    LABORATORY PANEL:   CBC  Recent Labs Lab 01/05/17 2328  WBC 8.0  HGB 13.3  HCT 39.4  PLT 191   ------------------------------------------------------------------------------------------------------------------  Chemistries   Recent Labs Lab 01/05/17 2328  NA 138  K 3.7  CL 106  CO2 26  GLUCOSE 109*  BUN 20  CREATININE 0.77  CALCIUM 9.2  AST 23  ALT 16  ALKPHOS 81  BILITOT 0.7   ------------------------------------------------------------------------------------------------------------------  Cardiac Enzymes  Recent Labs Lab 01/05/17 2328  TROPONINI 0.13*    ------------------------------------------------------------------------------------------------------------------  RADIOLOGY:  Ct Head Code Stroke Wo Contrast  Result Date: 01/06/2017 CLINICAL DATA:  Code stroke. Acute onset LEFT-sided weakness and LEFT facial droop. EXAM: CT HEAD WITHOUT CONTRAST TECHNIQUE: Contiguous axial images were obtained from the base of the skull through the vertex without intravenous contrast. COMPARISON:  CT face December 23, 2016 FINDINGS: BRAIN: No intraparenchymal hemorrhage, mass effect nor midline shift. The ventricles and sulci are normal for age. Patchy supratentorial white matter hypodensities. No acute large vascular territory infarcts. No abnormal extra-axial fluid collections. Basal cisterns are patent. VASCULAR: Mild calcific atherosclerosis of the carotid siphons. Faintly dense intracranial vessels most compatible with hemoconcentration. SKULL: No skull fracture. Osteopenia. No significant scalp soft tissue swelling. SINUSES/ORBITS: The mastoid air-cells and included paranasal sinuses are well-aerated.The included ocular globes and orbital contents are non-suspicious. OTHER: None. ASPECTS [facility] Stroke Program Early CT Score) - Ganglionic level infarction (caudate, lentiform nuclei, internal capsule, insula, M1-M3 cortex): 7 - Supraganglionic infarction (M4-M6 cortex): 3 Total score (0-10 with 10 being normal): 10 IMPRESSION: 1. No acute intracranial process. 2. Moderate chronic small vessel ischemic disease.  3. ASPECTS is 10. Critical Value/emergent results were called by telephone at the time of interpretation on 01/05/2017 at 11:48 pm to Dr. Darel Hong , who verbally acknowledged these results. Electronically Signed   By: Elon Alas M.D.   On: 01/06/2017 00:07    EKG:   Orders placed or performed during the hospital encounter of 01/05/17  . ED EKG  . ED EKG  . EKG 12-Lead  . EKG 12-Lead    IMPRESSION AND PLAN:  Principal Problem:   Left  hemiparesis ([place]) - admitted per stroke admission orders sent with appropriate imaging, labs, and consults including neurology consult Active Problems:   Elevated troponin - we will trend her cardiac enzymes tonight and get an echocardiogram tomorrow, cardiology consult could be considered based on the results of listed testing   HTN (hypertension) - hold antihypertensives for now, permissive hypertension for the first 24 hours blood pressure goal less than 220/120  All the records are reviewed and case discussed with ED provider. Management plans discussed with the patient and/or family.  DVT PROPHYLAXIS: SubQ lovenox  GI PROPHYLAXIS: None  ADMISSION STATUS: Observation  CODE STATUS: Full Code Status History    This patient does not have a recorded code status. Please follow your organizational policy for patients in this situation.      TOTAL TIME TAKING CARE OF THIS PATIENT: 45 minutes.   Yehuda Printup FIELDING 01/06/2017, 1:16 AM  [facility]  Office  [phone]  CC: Primary care physician; Margarita Rana, MD  Note:  This document was prepared using Dragon voice recognition software and may include unintentional dictation errors.

## 2017-01-06 NOTE — Evaluation (Signed)
Physical Therapy Evaluation Patient Details Name: Angel Lane MRN: [id number] DOB: [birth year] Today's Date: 01/06/2017   History of Present Illness  Pt admitted for R CVA with L side weakness. History includes previous L CVA and HTN.   Clinical Impression  Pt is a pleasant 66 year old female who was admitted for R CVA. Pt performs bed mobility with cga, transfers with mod assist, and ambulation with min assist and RW. Pt demonstrates deficits with strength/mobility/balance. Impaired coordination in L hand with opposition testing as well as RAMPS in L LE. Reports no sensation deficits at this time. Speech slightly slurred, however pt attributes to her partial not in mouth. Pt appears to have limited insight to deficits noted. Impaired balance during mobility efforts with slight buckling of L knee, needs use of RW and hands on assist for mobility. Would benefit from skilled PT to address above deficits and promote optimal return to PLOF. Needs intensive therapy at this time to regain independence as she is not at baseline level.      Follow Up Recommendations CIR    Equipment Recommendations  Rolling walker with 5" wheels    Recommendations for Other Services       Precautions / Restrictions Precautions Precautions: Fall Restrictions Weight Bearing Restrictions: No      Mobility  Bed Mobility Overal bed mobility: Needs Assistance Bed Mobility: Supine to Sit     Supine to sit: Min guard     General bed mobility comments: has difficulty reaching for railing using L arm. Needs cues for sequencing, once seated able to sit with upright posture.  Transfers Overall transfer level: Needs assistance Equipment used: Rolling walker (2 wheeled) Transfers: Sit to/from Stand Sit to Stand: Mod assist         General transfer comment: has difficulty reaching for RW with L arm. Once standing, increased sway and unsteadiness, needs assist to maintain balance. No buckling  noted  Ambulation/Gait Ambulation/Gait assistance: Min assist Ambulation Distance (Feet): 3 Feet Assistive device: Rolling walker (2 wheeled) Gait Pattern/deviations: Step-to pattern     General Gait Details: increased functional weakness noted in L LE during ambulation limiting further distance. Slight buckling noted in L knee, however pt able to compensate using R arm on RW. Cues for sequencing. Unsteadiness noted with post leaning.  Stairs            Wheelchair Mobility    Modified Rankin (Stroke Patients Only)       Balance Overall balance assessment: Needs assistance Sitting-balance support: Feet supported Sitting balance-Leahy Scale: Good     Standing balance support: Bilateral upper extremity supported Standing balance-Leahy Scale: Fair                               Pertinent Vitals/Pain Pain Assessment: No/denies pain    Home Living Family/patient expects to be discharged to:: Private residence Living Arrangements: Spouse/significant other Available Help at Discharge: Family Type of Home: House Home Access: Stairs to enter Entrance Stairs-Rails: Can reach both Entrance Stairs-Number of Steps: 4 Home Layout: One level Home Equipment: None Additional Comments: also has enterance through garage without railings    Prior Function Level of Independence: Independent               Hand Dominance   Dominant Hand: Right    Extremity/Trunk Assessment   Upper Extremity Assessment Upper Extremity Assessment: LUE deficits/detail (R UE grossly 5/5) LUE Deficits / Details: grip  grossly 3-/5; biceps 3+/5; shoulder flexion 3/5 LUE Coordination: decreased fine motor    Lower Extremity Assessment Lower Extremity Assessment: LLE deficits/detail (R LE grossly 5/5) LLE Deficits / Details: ankle DF/PF 4/5; quad/hamstrings 3+/5 LLE Sensation:  (WNL) LLE Coordination: decreased fine motor (has difficulty with RAMPS)       Communication    Communication: No difficulties  Cognition Arousal/Alertness: Awake/alert Behavior During Therapy: WFL for tasks assessed/performed Overall Cognitive Status: Within Functional Limits for tasks assessed                                        General Comments      Exercises Other Exercises Other Exercises: seated ther-ex performed including L LE quad sets, SLRs, LAQ, and hip abd/add. Encouraged to continue performance throughout the day. 10 reps performed with min assist.   Assessment/Plan    PT Assessment Patient needs continued PT services  PT Problem List Decreased strength;Decreased activity tolerance;Decreased balance;Decreased mobility;Decreased coordination       PT Treatment Interventions DME instruction;Gait training;Therapeutic exercise;Balance training    PT Goals (Current goals can be found in the Care Plan section)  Acute Rehab PT Goals Patient Stated Goal: wants to go home PT Goal Formulation: With patient Time For Goal Achievement: 01/20/17 Potential to Achieve Goals: Good    Frequency 7X/week   Barriers to discharge        Co-evaluation               AM-PAC PT "6 Clicks" Daily Activity  Outcome Measure Difficulty turning over in bed (including adjusting bedclothes, sheets and blankets)?: Unable Difficulty moving from lying on back to sitting on the side of the bed? : Unable Difficulty sitting down on and standing up from a chair with arms (e.g., wheelchair, bedside commode, etc,.)?: Unable Help needed moving to and from a bed to chair (including a wheelchair)?: A Little Help needed walking in hospital room?: A Lot Help needed climbing 3-5 steps with a railing? : A Lot 6 Click Score: 10    End of Session Equipment Utilized During Treatment: Gait belt Activity Tolerance: Patient tolerated treatment well Patient left: in chair;with chair alarm set Nurse Communication: Mobility status PT Visit Diagnosis: Unsteadiness on feet  (R26.81);Muscle weakness (generalized) (M62.81);Difficulty in walking, not elsewhere classified (R26.2);Hemiplegia and hemiparesis Hemiplegia - Right/Left: Left Hemiplegia - dominant/non-dominant: Non-dominant Hemiplegia - caused by: Cerebral infarction    Time: 0802-0823 PT Time Calculation (min) (ACUTE ONLY): 21 min   Charges:   PT Evaluation $PT Eval Moderate Complexity: 1 Mod PT Treatments $Therapeutic Exercise: 8-22 mins   PT G Codes:   PT G-Codes **NOT FOR INPATIENT CLASS** Functional Assessment Tool Used: AM-PAC 6 Clicks Basic Mobility Functional Limitation: Mobility: Walking and moving around Mobility: Walking and Moving Around Current Status (U3845): At least 60 percent but less than 80 percent impaired, limited or restricted Mobility: Walking and Moving Around Goal Status [phone]): At least 40 percent but less than 60 percent impaired, limited or restricted    Greggory Stallion, PT, DPT [phone]   Giovanny Dugal 01/06/2017, 9:19 AM

## 2017-01-06 NOTE — Progress Notes (Signed)
Speech is now slurred-paged on call physician.

## 2017-01-07 ENCOUNTER — Other Ambulatory Visit: Payer: Self-pay

## 2017-01-07 LAB — ECHOCARDIOGRAM COMPLETE
Height: 65 in
Weight: 2833.6 oz

## 2017-01-07 MED ORDER — HYDROCHLOROTHIAZIDE 25 MG PO TABS
25.0000 mg | ORAL_TABLET | Freq: Every day | ORAL | Status: DC
  Administered 2017-01-07 – 2017-01-08 (×2): 25 mg via ORAL
  Filled 2017-01-07 (×2): qty 1

## 2017-01-07 MED ORDER — ASPIRIN EC 325 MG PO TBEC
325.0000 mg | DELAYED_RELEASE_TABLET | Freq: Every day | ORAL | Status: DC
  Administered 2017-01-07 – 2017-01-08 (×2): 325 mg via ORAL
  Filled 2017-01-07 (×2): qty 1

## 2017-01-07 NOTE — Progress Notes (Signed)
Physical Therapy Treatment Patient Details Name: Angel Lane MRN: [id number] DOB: 01-17-1951 Today's Date: 01/07/2017    History of Present Illness Pt admitted for R CVA with L side weakness. History includes previous L CVA and HTN.     PT Comments    Pt has made significant progress in therapy with improved coordination and strength on L hemibody. Able to ambulate with/without RW this date with improved balance, still with slow speed and decreased coordination, needs cues for assistance. Will be able to progress to OP PT at this time. Will continue to progress.   Follow Up Recommendations  Outpatient PT     Equipment Recommendations  Rolling walker with 5" wheels    Recommendations for Other Services       Precautions / Restrictions Precautions Precautions: Fall Restrictions Weight Bearing Restrictions: No    Mobility  Bed Mobility Overal bed mobility: Needs Assistance Bed Mobility: Supine to Sit;Sit to Supine     Supine to sit: Supervision     General bed mobility comments: improved ability to perform bed mobility this date. Once seated at EOB, able to sit with upright posture  Transfers Overall transfer level: Needs assistance Equipment used: Rolling walker (2 wheeled) Transfers: Sit to/from Stand Sit to Stand: Min guard         General transfer comment: Still needs cues to push from seated surface. Once standing has difficulty reaching for RW with left hand. Improved grasp this date and no unsteadiness during stance  Ambulation/Gait Ambulation/Gait assistance: Min guard Ambulation Distance (Feet): 250 Feet Assistive device: Rolling walker (2 wheeled) Gait Pattern/deviations: Step-through pattern     General Gait Details: Slight decreased L step, however improved to reciprocal gait. Improved balance noted and speed. RW removed for last 150' with decreased speed, however no buckling noted and decreased L arm swing.   Stairs             Wheelchair Mobility    Modified Rankin (Stroke Patients Only)       Balance                                            Cognition Arousal/Alertness: Awake/alert Behavior During Therapy: WFL for tasks assessed/performed Overall Cognitive Status: Within Functional Limits for tasks assessed                                        Exercises Other Exercises Other Exercises: Performed supine ther-ex including ankle pumps, SLRs, hip abd/add, and heel slides on L LE. Improved coordination with ther-ex, however leg does fatigue ~ 8 reps. Supervision given    General Comments        Pertinent Vitals/Pain Pain Assessment: No/denies pain    Home Living                      Prior Function            PT Goals (current goals can now be found in the care plan section) Acute Rehab PT Goals Patient Stated Goal: wants to go home PT Goal Formulation: With patient Time For Goal Achievement: 01/20/17 Potential to Achieve Goals: Good Progress towards PT goals: Progressing toward goals    Frequency    7X/week      PT Plan Discharge  plan needs to be updated    Co-evaluation              AM-PAC PT "6 Clicks" Daily Activity  Outcome Measure  Difficulty turning over in bed (including adjusting bedclothes, sheets and blankets)?: None Difficulty moving from lying on back to sitting on the side of the bed? : None Difficulty sitting down on and standing up from a chair with arms (e.g., wheelchair, bedside commode, etc,.)?: Unable Help needed moving to and from a bed to chair (including a wheelchair)?: A Little Help needed walking in hospital room?: A Little Help needed climbing 3-5 steps with a railing? : A Little 6 Click Score: 18    End of Session Equipment Utilized During Treatment: Gait belt Activity Tolerance: Patient tolerated treatment well Patient left: in chair;with chair alarm set Nurse Communication: Mobility  status PT Visit Diagnosis: Unsteadiness on feet (R26.81);Muscle weakness (generalized) (M62.81);Difficulty in walking, not elsewhere classified (R26.2);Hemiplegia and hemiparesis Hemiplegia - Right/Left: Left Hemiplegia - dominant/non-dominant: Non-dominant Hemiplegia - caused by: Cerebral infarction     Time: 0845-0908 PT Time Calculation (min) (ACUTE ONLY): 23 min  Charges:  $Gait Training: 8-22 mins $Therapeutic Exercise: 8-22 mins                    G Codes:  Functional Assessment Tool Used: AM-PAC 6 Clicks Basic Mobility Functional Limitation: Mobility: Walking and moving around    Angel Lane, DPT [phone]    Angel Lane 01/07/2017, 9:54 AM

## 2017-01-07 NOTE — Progress Notes (Addendum)
[facility] [place] NAME: Angel Lane    MR#:  [id number]  DATE OF BIRTH:  [birth year]  SUBJECTIVE:  CHIEF COMPLAINT:   Chief Complaint  Patient presents with  . Code Stroke    Had facial and left sided weakness, found to have a stroke, offered TPA, but refused.  Patient is clinically feeling better left upper extremity weakness is significantly improving, tolerating diet  REVIEW OF SYSTEMS:  CONSTITUTIONAL: No fever, fatigue or weakness.  EYES: No blurred or double vision.  EARS, NOSE, AND THROAT: No tinnitus or ear pain.  RESPIRATORY: No cough, shortness of breath, wheezing or hemoptysis.  CARDIOVASCULAR: No chest pain, orthopnea, edema.  GASTROINTESTINAL: No nausea, vomiting, diarrhea or abdominal pain.  GENITOURINARY: No dysuria, hematuria.  ENDOCRINE: No polyuria, nocturia,  HEMATOLOGY: No anemia, easy bruising or bleeding SKIN: No rash or lesion. MUSCULOSKELETAL: No joint pain or arthritis.   NEUROLOGIC: No tingling, patient's left sided numbness, weakness or improving PSYCHIATRY: No anxiety or depression.   ROS  DRUG ALLERGIES:   Allergies  Allergen Reactions  . Penicillins Other (See Comments)    chills    VITALS:  Blood pressure (!) 153/93, pulse 84, temperature 98.7 F (37.1 C), temperature source Oral, resp. rate 18, height 5\' 5"  (1.651 m), weight 80.3 kg (177 lb 1.6 oz), SpO2 100 %.  PHYSICAL EXAMINATION:  GENERAL:  66 y.o.-year-old patient lying in the bed with no acute distress.  EYES: Pupils equal, round, reactive to light and accommodation. No scleral icterus. Extraocular muscles intact.  HEENT: Head atraumatic, normocephalic. Oropharynx and nasopharynx clear.  NECK:  Supple, no jugular venous distention. No thyroid enlargement, no tenderness.  LUNGS: Normal breath sounds bilaterally, no wheezing, rales,rhonchi or crepitation. No use of accessory muscles of respiration.  CARDIOVASCULAR: S1, S2 normal. No  murmurs, rubs, or gallops.  ABDOMEN: Soft, nontender, nondistended. Bowel sounds present. No organomegaly or mass.  EXTREMITIES: No pedal edema, cyanosis, or clubbing.  NEUROLOGIC: Cranial nerves II through XII are intact except face weakness on left side. Muscle strength 5/5 in right extremities 4 reexploration/5 in left side. Sensation intact. Gait not checked.  PSYCHIATRIC: The patient is alert and oriented x 3.  SKIN: No obvious rash, lesion, or ulcer.   Physical Exam LABORATORY PANEL:   CBC Recent Labs  Lab 01/06/17 0643  WBC 6.2  HGB 13.0  HCT 38.8  PLT 192   ------------------------------------------------------------------------------------------------------------------  Chemistries  Recent Labs  Lab 01/05/17 2328 01/06/17 0643  NA 138  --   K 3.7  --   CL 106  --   CO2 26  --   GLUCOSE 109*  --   BUN 20  --   CREATININE 0.77 0.69  CALCIUM 9.2  --   AST 23  --   ALT 16  --   ALKPHOS 81  --   BILITOT 0.7  --    ------------------------------------------------------------------------------------------------------------------  Cardiac Enzymes Recent Labs  Lab 01/05/17 2328  TROPONINI 0.13*   ------------------------------------------------------------------------------------------------------------------  RADIOLOGY:  Mr [name] Head Wo Contrast  Result Date: 01/06/2017 CLINICAL DATA:  Severe onset LEFT arm and LEFT leg weakness beginning at 11 p.m. tonight. History of hypertension. EXAM: MRI HEAD WITHOUT CONTRAST MRA HEAD WITHOUT CONTRAST TECHNIQUE: Multiplanar, multiecho pulse sequences of the brain and surrounding structures were obtained without intravenous contrast. Angiographic images of the head were obtained using MRA technique without contrast. COMPARISON:  CT HEAD January 05, 2017 FINDINGS: MRI HEAD FINDINGS BRAIN: 9 x 6  x 13 mm reduced diffusion RIGHT lenticulostriate nucleus with low ADC values. No susceptibility artifact to suggest hemorrhage.  Ventricles and sulci are normal for patient's age. Patchy supratentorial white matter FLAIR T2 hyperintensities. 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 No midline shift, mass effect or masses. VASCULAR: Normal major intracranial vascular flow voids present at skull base. SKULL AND UPPER CERVICAL SPINE: No abnormal sellar expansion. No suspicious calvarial bone marrow signal. Craniocervical junction maintained. SINUSES/ORBITS: Small bilateral maxillary mucosal retention cysts, mild paranasal sinus mucosal thickening. Mastoid air cells are well aerated. The included ocular globes and orbital contents are non-suspicious. OTHER: None. MRA HEAD FINDINGS- Mild motion degraded examination. ANTERIOR CIRCULATION: Flow related enhancement of the included cervical, petrous, cavernous and supraclinoid internal carotid arteries. Slight luminal irregularity LEFT proximal cavernous segment most compatible with tortuosity in artifact. Moderate stenosis LEFT carotid terminus to proximal LEFT MCA. 1-2 mm inferiorly directed out patching bilateral supraclinoid internal carotid artery's. Patent anterior communicating artery. Patent anterior and middle cerebral arteries, including distal segments. However, there is less robust LEFT middle cerebral artery flow related enhancement as compared to the RIGHT. No large vessel occlusion, flow limiting stenosis, aneurysm. POSTERIOR CIRCULATION: LEFT vertebral artery is dominant. Basilar artery is patent, with normal flow related enhancement of the main branch vessels. Patent posterior cerebral arteries. Severe stenosis LEFT P1 origin and proximal RIGHT P2 segment. Severe stenosis proximal P3 segment. No large vessel occlusion, flow limiting stenosis,  aneurysm. ANATOMIC VARIANTS: Hypoplastic LEFT A1 segment. Source images and MIP images were reviewed. IMPRESSION: MRI HEAD: 1. 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 2. Old LEFT basal ganglia lacunar  infarct. Old small LEFT cerebellar infarct. 3. Moderate chronic small vessel ischemic disease. MRA HEAD: 1. No emergent large vessel occlusion on this mildly motion degraded examination. 2. Moderate stenosis LEFT carotid terminus to proximal MCA. Slightly slower flow LEFT middle cerebral artery. 3. Severe stenosis bilateral posterior cerebral artery's most compatible with atherosclerosis. 4. 2 mm outpouching bilateral supraclinoid internal carotid artery's seen with posterior communicating artery infundibula versus aneurysms. Electronically Signed   By: Elon Alas M.D.   On: 01/06/2017 02:44   Mr Brain Wo Contrast (neuro Protocol)  Result Date: 01/06/2017 CLINICAL DATA:  Severe onset LEFT arm and LEFT leg weakness beginning at 11 p.m. tonight. History of hypertension. EXAM: MRI HEAD WITHOUT CONTRAST MRA HEAD WITHOUT CONTRAST TECHNIQUE: Multiplanar, multiecho pulse sequences of the brain and surrounding structures were obtained without intravenous contrast. Angiographic images of the head were obtained using MRA technique without contrast. COMPARISON:  CT HEAD January 05, 2017 FINDINGS: MRI HEAD FINDINGS BRAIN: 9 x 6 x 13 mm reduced diffusion RIGHT lenticulostriate nucleus with low ADC values. No susceptibility artifact to suggest hemorrhage. Ventricles and sulci are normal for patient's age. Patchy supratentorial white matter FLAIR T2 hyperintensities. 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 No midline shift, mass effect or masses. VASCULAR: Normal major intracranial vascular flow voids present at skull base. SKULL AND UPPER CERVICAL SPINE: No abnormal sellar expansion. No suspicious calvarial bone marrow signal. Craniocervical junction maintained. SINUSES/ORBITS: Small bilateral maxillary mucosal retention cysts, mild paranasal sinus mucosal thickening. Mastoid air cells are well aerated. The included ocular globes and orbital contents are non-suspicious. OTHER: None.  MRA HEAD FINDINGS- Mild motion degraded examination. ANTERIOR CIRCULATION: Flow related enhancement of the included cervical, petrous, cavernous and supraclinoid internal carotid arteries. Slight luminal irregularity LEFT proximal cavernous segment most compatible with tortuosity  in artifact. Moderate stenosis LEFT carotid terminus to proximal LEFT MCA. 1-2 mm inferiorly directed out patching bilateral supraclinoid internal carotid artery's. Patent anterior communicating artery. Patent anterior and middle cerebral arteries, including distal segments. However, there is less robust LEFT middle cerebral artery flow related enhancement as compared to the RIGHT. No large vessel occlusion, flow limiting stenosis, aneurysm. POSTERIOR CIRCULATION: LEFT vertebral artery is dominant. Basilar artery is patent, with normal flow related enhancement of the main branch vessels. Patent posterior cerebral arteries. Severe stenosis LEFT P1 origin and proximal RIGHT P2 segment. Severe stenosis proximal P3 segment. No large vessel occlusion, flow limiting stenosis,  aneurysm. ANATOMIC VARIANTS: Hypoplastic LEFT A1 segment. Source images and MIP images were reviewed. IMPRESSION: MRI HEAD: 1. 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 2. 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 Old small LEFT cerebellar infarct. 3. Moderate chronic small vessel ischemic disease. MRA HEAD: 1. No emergent large vessel occlusion on this mildly motion degraded examination. 2. Moderate stenosis LEFT carotid terminus to proximal MCA. Slightly slower flow LEFT middle cerebral artery. 3. Severe stenosis bilateral posterior cerebral artery's most compatible with atherosclerosis. 4. 2 mm outpouching bilateral supraclinoid internal carotid artery's seen with posterior communicating artery infundibula versus aneurysms. Electronically Signed   By: Elon Alas M.D.   On: 01/06/2017 02:44   US Carotid Bilateral (at [facility] And [facility] Only)  Result Date:  01/06/2017 CLINICAL DATA:  66 year old female with a history of acute stroke. Cardiovascular risk factors include hypertension, known prior stroke/TIA, tobacco use EXAM: BILATERAL CAROTID DUPLEX ULTRASOUND TECHNIQUE: Pearline Cables scale imaging, color Doppler and duplex ultrasound were performed of bilateral carotid and vertebral arteries in the neck. COMPARISON:  No prior duplex FINDINGS: Criteria: Quantification of carotid stenosis is based on velocity parameters that correlate the residual internal carotid diameter with NASCET-based stenosis levels, using the diameter of the distal internal carotid lumen as the denominator for stenosis measurement. The following velocity measurements were obtained: RIGHT ICA:  Systolic 70 cm/sec, Diastolic 28 cm/sec CCA:  77 cm/sec SYSTOLIC ICA/CCA RATIO:  0.9 ECA:  52 cm/sec LEFT ICA:  Systolic 43 cm/sec, Diastolic 14 cm/sec CCA:  75 cm/sec SYSTOLIC ICA/CCA RATIO:  0.6 ECA:  70 cm/sec Right Brachial SBP: Not acquired Left Brachial SBP: Not acquired RIGHT CAROTID ARTERY: No significant calcified disease of the right common carotid artery. Intermediate waveform maintained. Heterogeneous plaque without significant calcifications at the right carotid bifurcation. Low resistance waveform of the right ICA. No significant tortuosity. RIGHT VERTEBRAL ARTERY: Antegrade flow with low resistance waveform. LEFT CAROTID ARTERY: No significant calcified disease of the left common carotid artery. Intermediate waveform maintained. Heterogeneous plaque at the left carotid bifurcation without significant calcifications. Low resistance waveform of the left ICA. LEFT VERTEBRAL ARTERY:  Antegrade flow with low resistance waveform. IMPRESSION: Color duplex indicates minimal heterogeneous plaque, with no hemodynamically significant stenosis by duplex criteria in the extracranial cerebrovascular circulation. Signed, Dulcy Fanny. Earleen Newport, DO [facility] Radiology  Electronically Signed   By: Corrie Mckusick D.O.   On: 01/06/2017 15:31   Ct Head Code Stroke Wo Contrast  Result Date: 01/06/2017 CLINICAL DATA:  Code stroke. Acute onset LEFT-sided weakness and LEFT facial droop. EXAM: CT HEAD WITHOUT CONTRAST TECHNIQUE: Contiguous axial images were obtained from the base of the skull through the vertex without intravenous contrast. COMPARISON:  CT face December 23, 2016 FINDINGS: BRAIN: No intraparenchymal hemorrhage, mass effect nor midline shift. The ventricles and sulci are normal for age. Patchy  supratentorial white matter hypodensities. No acute large vascular territory infarcts. No abnormal extra-axial fluid collections. Basal cisterns are patent. VASCULAR: Mild calcific atherosclerosis of the carotid siphons. Faintly dense intracranial vessels most compatible with hemoconcentration. SKULL: No skull fracture. Osteopenia. No significant scalp soft tissue swelling. SINUSES/ORBITS: The mastoid air-cells and included paranasal sinuses are well-aerated.The included ocular globes and orbital contents are non-suspicious. OTHER: None. ASPECTS [facility] Stroke Program Early CT Score) - Ganglionic level infarction (caudate, lentiform nuclei, internal capsule, insula, M1-M3 cortex): 7 - Supraganglionic infarction (M4-M6 cortex): 3 Total score (0-10 with 10 being normal): 10 IMPRESSION: 1. No acute intracranial process. 2. Moderate chronic small vessel ischemic disease. 3. ASPECTS is 10. Critical Value/emergent results were called by telephone at the time of interpretation on 01/05/2017 at 11:48 pm to Dr. Darel Hong , who verbally acknowledged these results. Electronically Signed   By: Elon Alas M.D.   On: 01/06/2017 00:07    ASSESSMENT AND PLAN:   Principal Problem:   Left hemiparesis ([place]) Active Problems:   HTN (hypertension)   Elevated troponin   CVA (cerebral vascular accident) ([place])  * Left hemiparesis ([place]) - acute stroke  neurology following    LDL 107,  started atorvastatin.   ASA 325 mg p.o. once daily   PT , OT eval-recommending CIR    Pt had refused TPA at the time of admission    Echo.  Revealed 60-65% ejection fraction and no cardiac source of emboli, discontinue telemetry passed  swallow evaluation Hemoglobin A1c 5.8   *  Elevated troponin -    Likely due to acute stroke, no further work ups needed.  *  HTN (hypertension) - allowed permissive hypertension for the first 24 hours blood pressure goal less than 220/120 Resume blood pressure medications HCTZ , home med and titrate as needed for better control  *Generalized weakness related to stroke PT is recommending CIR follow-up with social worker and case management   *tobacco abuse disorder-counseled patient to quit smoking for 4 minutes, offer nicotine patch  All the records are reviewed and case discussed with Care Management/Social Workerr. Management plans discussed with the patient, family and they are in agreement.  CODE STATUS:full.  TOTAL TIME TAKING CARE OF THIS PATIENT: 35 minutes.     POSSIBLE D/C IN 1DAYS, DEPENDING ON CLINICAL CONDITION.   Nicholes Mango M.D on 01/07/2017   Between 7am to 6pm - Pager - [phone]  After 6pm go to [email or web address] - password [email or web address] [facility]  Office  [phone]  CC: Primary care physician; Margarita Rana, MD  Note: This dictation was prepared with Dragon dictation along with smaller phrase technology. Any transcriptional errors that result from this process are unintentional.

## 2017-01-07 NOTE — Progress Notes (Signed)
[facility] NAME: Angel Lane    MR#:  [id number]  DATE OF BIRTH:  Jul 12, 1950  SUBJECTIVE:  CHIEF COMPLAINT:   Chief Complaint  Patient presents with  . Code Stroke    Had facial and left sided weakness, found to have a stroke, offered TPA, but refused. Symptoms slightly better today.  REVIEW OF SYSTEMS:  CONSTITUTIONAL: No fever, fatigue or weakness.  EYES: No blurred or double vision.  EARS, NOSE, AND THROAT: No tinnitus or ear pain.  RESPIRATORY: No cough, shortness of breath, wheezing or hemoptysis.  CARDIOVASCULAR: No chest pain, orthopnea, edema.  GASTROINTESTINAL: No nausea, vomiting, diarrhea or abdominal pain.  GENITOURINARY: No dysuria, hematuria.  ENDOCRINE: No polyuria, nocturia,  HEMATOLOGY: No anemia, easy bruising or bleeding SKIN: No rash or lesion. MUSCULOSKELETAL: No joint pain or arthritis.   NEUROLOGIC: No tingling, left sided numbness, weakness.  PSYCHIATRY: No anxiety or depression.   ROS  DRUG ALLERGIES:   Allergies  Allergen Reactions  . Penicillins Other (See Comments)    chills    VITALS:  Blood pressure (!) 157/82, pulse 76, temperature 98.2 F (36.8 C), temperature source Oral, resp. rate 18, height 5\' 5"  (1.651 m), weight 80.3 kg (177 lb 1.6 oz), SpO2 97 %.  PHYSICAL EXAMINATION:  GENERAL:  66 y.o.-year-old patient lying in the bed with no acute distress.  EYES: Pupils equal, round, reactive to light and accommodation. No scleral icterus. Extraocular muscles intact.  HEENT: Head atraumatic, normocephalic. Oropharynx and nasopharynx clear.  NECK:  Supple, no jugular venous distention. No thyroid enlargement, no tenderness.  LUNGS: Normal breath sounds bilaterally, no wheezing, rales,rhonchi or crepitation. No use of accessory muscles of respiration.  CARDIOVASCULAR: S1, S2 normal. No murmurs, rubs, or gallops.  ABDOMEN: Soft, nontender, nondistended. Bowel sounds present. No organomegaly  or mass.  EXTREMITIES: No pedal edema, cyanosis, or clubbing.  NEUROLOGIC: Cranial nerves II through XII are intact except face weakness on left side. Muscle strength 5/5 in right extremities 3/5 in left side. Sensation intact. Gait not checked.  PSYCHIATRIC: The patient is alert and oriented x 3.  SKIN: No obvious rash, lesion, or ulcer.   Physical Exam LABORATORY PANEL:   CBC  Recent Labs Lab 01/06/17 0643  WBC 6.2  HGB 13.0  HCT 38.8  PLT 192   ------------------------------------------------------------------------------------------------------------------  Chemistries   Recent Labs Lab 01/05/17 2328 01/06/17 0643  NA 138  --   K 3.7  --   CL 106  --   CO2 26  --   GLUCOSE 109*  --   BUN 20  --   CREATININE 0.77 0.69  CALCIUM 9.2  --   AST 23  --   ALT 16  --   ALKPHOS 81  --   BILITOT 0.7  --    ------------------------------------------------------------------------------------------------------------------  Cardiac Enzymes  Recent Labs Lab 01/05/17 2328  TROPONINI 0.13*   ------------------------------------------------------------------------------------------------------------------  RADIOLOGY:  Mr Angel Lane Head Wo Contrast  Result Date: 01/06/2017 CLINICAL DATA:  Severe onset LEFT arm and LEFT leg weakness beginning at 11 p.m. tonight. History of hypertension. EXAM: MRI HEAD WITHOUT CONTRAST MRA HEAD WITHOUT CONTRAST TECHNIQUE: Multiplanar, multiecho pulse sequences of the brain and surrounding structures were obtained without intravenous contrast. Angiographic images of the head were obtained using MRA technique without contrast. COMPARISON:  CT HEAD January 05, 2017 FINDINGS: MRI HEAD FINDINGS BRAIN: 9 x 6 x 13 mm reduced diffusion RIGHT lenticulostriate nucleus with low ADC values. No susceptibility  artifact to suggest hemorrhage. Ventricles and sulci are normal for patient's age. Patchy supratentorial white matter FLAIR T2 hyperintensities. Old small  LEFT cerebellar infarct. 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 No midline shift, mass effect or masses. VASCULAR: Normal major intracranial vascular flow voids present at skull base. SKULL AND UPPER CERVICAL SPINE: No abnormal sellar expansion. No suspicious calvarial bone marrow signal. Craniocervical junction maintained. SINUSES/ORBITS: Small bilateral maxillary mucosal retention cysts, mild paranasal sinus mucosal thickening. Mastoid air cells are well aerated. The included ocular globes and orbital contents are non-suspicious. OTHER: None. MRA HEAD FINDINGS- Mild motion degraded examination. ANTERIOR CIRCULATION: Flow related enhancement of the included cervical, petrous, cavernous and supraclinoid internal carotid arteries. Slight luminal irregularity LEFT proximal cavernous segment most compatible with tortuosity in artifact. Moderate stenosis LEFT carotid terminus to proximal LEFT MCA. 1-2 mm inferiorly directed out patching bilateral supraclinoid internal carotid artery's. Patent anterior communicating artery. Patent anterior and middle cerebral arteries, including distal segments. However, there is less robust LEFT middle cerebral artery flow related enhancement as compared to the RIGHT. No large vessel occlusion, flow limiting stenosis, aneurysm. POSTERIOR CIRCULATION: LEFT vertebral artery is dominant. Basilar artery is patent, with normal flow related enhancement of the main branch vessels. Patent posterior cerebral arteries. Severe stenosis LEFT P1 origin and proximal RIGHT P2 segment. Severe stenosis proximal P3 segment. No large vessel occlusion, flow limiting stenosis,  aneurysm. ANATOMIC VARIANTS: Hypoplastic LEFT A1 segment. Source images and MIP images were reviewed. IMPRESSION: MRI HEAD: 1. 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 2. 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 Old small LEFT cerebellar infarct. 3. Moderate chronic small vessel ischemic disease. MRA HEAD: 1. No emergent  large vessel occlusion on this mildly motion degraded examination. 2. Moderate stenosis LEFT carotid terminus to proximal MCA. Slightly slower flow LEFT middle cerebral artery. 3. Severe stenosis bilateral posterior cerebral artery's most compatible with atherosclerosis. 4. 2 mm outpouching bilateral supraclinoid internal carotid artery's seen with posterior communicating artery infundibula versus aneurysms. Electronically Signed   By: Elon Alas M.D.   On: 01/06/2017 02:44   Mr Brain Wo Contrast (neuro Protocol)  Result Date: 01/06/2017 CLINICAL DATA:  Severe onset LEFT arm and LEFT leg weakness beginning at 11 p.m. tonight. History of hypertension. EXAM: MRI HEAD WITHOUT CONTRAST MRA HEAD WITHOUT CONTRAST TECHNIQUE: Multiplanar, multiecho pulse sequences of the brain and surrounding structures were obtained without intravenous contrast. Angiographic images of the head were obtained using MRA technique without contrast. COMPARISON:  CT HEAD January 05, 2017 FINDINGS: MRI HEAD FINDINGS BRAIN: 9 x 6 x 13 mm reduced diffusion RIGHT lenticulostriate nucleus with low ADC values. No susceptibility artifact to suggest hemorrhage. Ventricles and sulci are normal for patient's age. Patchy supratentorial white matter FLAIR T2 hyperintensities. 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 No midline shift, mass effect or masses. VASCULAR: Normal major intracranial vascular flow voids present at skull base. SKULL AND UPPER CERVICAL SPINE: No abnormal sellar expansion. No suspicious calvarial bone marrow signal. Craniocervical junction maintained. SINUSES/ORBITS: Small bilateral maxillary mucosal retention cysts, mild paranasal sinus mucosal thickening. Mastoid air cells are well aerated. The included ocular globes and orbital contents are non-suspicious. OTHER: None. MRA HEAD FINDINGS- Mild motion degraded examination. ANTERIOR CIRCULATION: Flow related enhancement of the included  cervical, petrous, cavernous and supraclinoid internal carotid arteries. Slight luminal irregularity LEFT proximal cavernous segment most compatible with tortuosity in artifact. Moderate stenosis LEFT carotid terminus to proximal LEFT MCA. 1-2 mm inferiorly  directed out patching bilateral supraclinoid internal carotid artery's. Patent anterior communicating artery. Patent anterior and middle cerebral arteries, including distal segments. However, there is less robust LEFT middle cerebral artery flow related enhancement as compared to the RIGHT. No large vessel occlusion, flow limiting stenosis, aneurysm. POSTERIOR CIRCULATION: LEFT vertebral artery is dominant. Basilar artery is patent, with normal flow related enhancement of the main branch vessels. Patent posterior cerebral arteries. Severe stenosis LEFT P1 origin and proximal RIGHT P2 segment. Severe stenosis proximal P3 segment. No large vessel occlusion, flow limiting stenosis,  aneurysm. ANATOMIC VARIANTS: Hypoplastic LEFT A1 segment. Source images and MIP images were reviewed. IMPRESSION: MRI HEAD: 1. 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 2. 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 Old small LEFT cerebellar infarct. 3. Moderate chronic small vessel ischemic disease. MRA HEAD: 1. No emergent large vessel occlusion on this mildly motion degraded examination. 2. Moderate stenosis LEFT carotid terminus to proximal MCA. Slightly slower flow LEFT middle cerebral artery. 3. Severe stenosis bilateral posterior cerebral artery's most compatible with atherosclerosis. 4. 2 mm outpouching bilateral supraclinoid internal carotid artery's seen with posterior communicating artery infundibula versus aneurysms. Electronically Signed   By: Elon Alas M.D.   On: 01/06/2017 02:44   US Carotid Bilateral (at [facility] And [facility] Only)  Result Date: 01/06/2017 CLINICAL DATA:  66 year old female with a history of acute stroke. Cardiovascular risk factors include  hypertension, known prior stroke/TIA, tobacco use EXAM: BILATERAL CAROTID DUPLEX ULTRASOUND TECHNIQUE: Pearline Cables scale imaging, color Doppler and duplex ultrasound were performed of bilateral carotid and vertebral arteries in the neck. COMPARISON:  No prior duplex FINDINGS: Criteria: Quantification of carotid stenosis is based on velocity parameters that correlate the residual internal carotid diameter with NASCET-based stenosis levels, using the diameter of the distal internal carotid lumen as the denominator for stenosis measurement. The following velocity measurements were obtained: RIGHT ICA:  Systolic 70 cm/sec, Diastolic 28 cm/sec CCA:  77 cm/sec SYSTOLIC ICA/CCA RATIO:  0.9 ECA:  52 cm/sec LEFT ICA:  Systolic 43 cm/sec, Diastolic 14 cm/sec CCA:  75 cm/sec SYSTOLIC ICA/CCA RATIO:  0.6 ECA:  70 cm/sec Right Brachial SBP: Not acquired Left Brachial SBP: Not acquired RIGHT CAROTID ARTERY: No significant calcified disease of the right common carotid artery. Intermediate waveform maintained. Heterogeneous plaque without significant calcifications at the right carotid bifurcation. Low resistance waveform of the right ICA. No significant tortuosity. RIGHT VERTEBRAL ARTERY: Antegrade flow with low resistance waveform. LEFT CAROTID ARTERY: No significant calcified disease of the left common carotid artery. Intermediate waveform maintained. Heterogeneous plaque at the left carotid bifurcation without significant calcifications. Low resistance waveform of the left ICA. LEFT VERTEBRAL ARTERY:  Antegrade flow with low resistance waveform. IMPRESSION: Color duplex indicates minimal heterogeneous plaque, with no hemodynamically significant stenosis by duplex criteria in the extracranial cerebrovascular circulation. Signed, Dulcy Fanny. Earleen Newport, DO [facility] Radiology Electronically Signed   By: Corrie Mckusick D.O.   On: 01/06/2017 15:31   Ct Head Code Stroke Wo Contrast  Result  Date: 01/06/2017 CLINICAL DATA:  Code stroke. Acute onset LEFT-sided weakness and LEFT facial droop. EXAM: CT HEAD WITHOUT CONTRAST TECHNIQUE: Contiguous axial images were obtained from the base of the skull through the vertex without intravenous contrast. COMPARISON:  CT face December 23, 2016 FINDINGS: BRAIN: No intraparenchymal hemorrhage, mass effect nor midline shift. The ventricles and sulci are normal for age. Patchy supratentorial white matter hypodensities. No acute large vascular territory infarcts. No abnormal extra-axial fluid  collections. Basal cisterns are patent. VASCULAR: Mild calcific atherosclerosis of the carotid siphons. Faintly dense intracranial vessels most compatible with hemoconcentration. SKULL: No skull fracture. Osteopenia. No significant scalp soft tissue swelling. SINUSES/ORBITS: The mastoid air-cells and included paranasal sinuses are well-aerated.The included ocular globes and orbital contents are non-suspicious. OTHER: None. ASPECTS [facility] Stroke Program Early CT Score) - Ganglionic level infarction (caudate, lentiform nuclei, internal capsule, insula, M1-M3 cortex): 7 - Supraganglionic infarction (M4-M6 cortex): 3 Total score (0-10 with 10 being normal): 10 IMPRESSION: 1. No acute intracranial process. 2. Moderate chronic small vessel ischemic disease. 3. ASPECTS is 10. Critical Value/emergent results were called by telephone at the time of interpretation on 01/05/2017 at 11:48 pm to Dr. Darel Hong , who verbally acknowledged these results. Electronically Signed   By: Elon Alas M.D.   On: 01/06/2017 00:07    ASSESSMENT AND PLAN:   Principal Problem:   Left hemiparesis ([place]) Active Problems:   HTN (hypertension)   Elevated troponin   CVA (cerebral vascular accident) ([place])  * Left hemiparesis ([place]) - acute stroke  neurology consult   LDL high, start atorvastatin.   ASA   PT , OT eval.    Pt had refused TPA yesterday.    Echo.  *  Elevated troponin -     Likely due to acute stroke, no further work ups needed.  *  HTN (hypertension) - hold antihypertensives for now, permissive hypertension for the first 24 hours blood pressure goal less than 220/120   All the records are reviewed and case discussed with Care Management/Social Workerr. Management plans discussed with the patient, family and they are in agreement.  CODE STATUS:full.  TOTAL TIME TAKING CARE OF THIS PATIENT: 35 minutes.     POSSIBLE D/C IN 1-2 DAYS, DEPENDING ON CLINICAL CONDITION.   Vaughan Basta M.D on 01/07/2017   Between 7am to 6pm - Pager - [phone]  After 6pm go to [email or web address] - password [email or web address] [facility]  Office  [phone]  CC: Primary care physician; Margarita Rana, MD  Note: This dictation was prepared with Dragon dictation along with smaller phrase technology. Any transcriptional errors that result from this process are unintentional.

## 2017-01-07 NOTE — Plan of Care (Signed)
Reports numbness above left lip and left face resolved with less facial droop with speech more clear. Demonstrated less weakness of left hand/arm with pt very pleased with this. States she is agreeable to go to inpatient rehab if needed. Improved mood. Multiple family members in/supportive. Up in chair most of morning. Neuro checks/vs' stable.

## 2017-01-08 MED ORDER — FLUTICASONE PROPIONATE 50 MCG/ACT NA SUSP: 2.0000 | Freq: Every day | NASAL | 2 refills | Status: DC

## 2017-01-08 MED ORDER — NICOTINE 21 MG/24HR TD PT24: 21.0000 mg | MEDICATED_PATCH | Freq: Every day | TRANSDERMAL | 0 refills | Status: DC

## 2017-01-08 MED ORDER — ATORVASTATIN CALCIUM 40 MG PO TABS: 40.0000 mg | ORAL_TABLET | Freq: Every day | ORAL | 0 refills | Status: DC

## 2017-01-08 MED ORDER — METOPROLOL TARTRATE 25 MG PO TABS: 25.0000 mg | ORAL_TABLET | Freq: Two times a day (BID) | ORAL | 0 refills | Status: DC

## 2017-01-08 MED ORDER — ASPIRIN 325 MG PO TBEC: 325.0000 mg | DELAYED_RELEASE_TABLET | Freq: Every day | ORAL | 0 refills | Status: DC

## 2017-01-08 NOTE — Discharge Summary (Signed)
[facility] NAME: Angel Lane    MR#:  [id number]  DATE OF BIRTH:  Feb 15, 1951  DATE OF ADMISSION:  01/05/2017 ADMITTING PHYSICIAN: Lance Coon, MD  DATE OF DISCHARGE: 01/08/17  PRIMARY CARE PHYSICIAN: Margarita Rana, MD    ADMISSION DIAGNOSIS:  Acute ischemic stroke ([place]) [I63.9]  DISCHARGE DIAGNOSIS:  Principal Problem:   Left hemiparesis ([place]) Active Problems:   HTN (hypertension)   Elevated troponin   CVA (cerebral vascular accident) ([place])   SECONDARY DIAGNOSIS:   Past Medical History:  Diagnosis Date  . Hypertension     HOSPITAL COURSE:   hpi  Angel Lane  is a 66 y.o. female who presents with left hemiparesis. Patient notices symptoms at home before laying down for bed, significant left upper and lower extremity weakness. Has been also states he may have noticed some facial alterations. Here in the ED she was offered TPA but refused to receive it. Symptoms have started to improve somewhat per her report, but her left arm is still significantly weak and has improved less than her left leg. Hospitalists were called for admission and further evaluation   * Left hemiparesis ([place]) - acute stroke  neurology following    LDL 107, started atorvastatin.   ASA 325 mg p.o. once daily   PT , OT eval-recommending CIR, patient was reassessed and recommended outpatient physical therapy    Pt had refused TPA at the time of admission    Echo.  Revealed 60-65% ejection fraction and no cardiac source of emboli, discontinue telemetry passed  swallow evaluation Hemoglobin A1c 5.8   *Elevated troponin -    Likely due to acute stroke, no further work ups needed.  *HTN (hypertension) - allowed permissive hypertension for the first 24 hours blood pressure goal less than 220/120 Resume blood pressure medications HCTZ , home med and titrate as needed for better control; add small dose of metoprolol to the  regimen  *Generalized weakness related to stroke ; patient was reassessed by CIR today recommending outpatient physical therapy patient is agreeable  *tobacco abuse disorder-counseled patient to quit smoking for 4 minutes, offer nicotine patch    DISCHARGE CONDITIONS:   Stable   CONSULTS OBTAINED:  Treatment Team:  Alexis Goodell, MD   PROCEDURES none   DRUG ALLERGIES:   Allergies  Allergen Reactions  . Penicillins Other (See Comments)    chills    DISCHARGE MEDICATIONS:   Current Discharge Medication List    START taking these medications   Details  aspirin EC 325 MG EC tablet Take 1 tablet (325 mg total) daily by mouth. Qty: 30 tablet, Refills: 0    atorvastatin (LIPITOR) 40 MG tablet Take 1 tablet (40 mg total) daily at 6 PM by mouth. Qty: 30 tablet, Refills: 0    fluticasone (FLONASE) 50 MCG/ACT nasal spray Place 2 sprays daily into both nostrils. Qty: 1 g, Refills: 2    metoprolol tartrate (LOPRESSOR) 25 MG tablet Take 1 tablet (25 mg total) 2 (two) times daily by mouth. Qty: 60 tablet, Refills: 0    nicotine (NICODERM CQ - DOSED IN MG/24 HOURS) 21 mg/24hr patch Place 1 patch (21 mg total) daily onto the skin. Qty: 28 patch, Refills: 0      CONTINUE these medications which have NOT CHANGED   Details  hydrochlorothiazide (HYDRODIURIL) 25 MG tablet Take 25 mg by mouth daily.      STOP taking these medications     HYDROcodone-acetaminophen (  NORCO) 5-325 MG tablet      ibuprofen (ADVIL,MOTRIN) 400 MG tablet      LORazepam (ATIVAN) 1 MG tablet          DISCHARGE INSTRUCTIONS:   Follow-up with primary care physician 5-7 days Follow-up with neurology in 3-4 weeks Outpatient physical therapy  DIET:  Cardiac diet  DISCHARGE CONDITION:  Stable  ACTIVITY:  Activity as tolerated  OXYGEN:  Home Oxygen: No.   Oxygen Delivery: room air  DISCHARGE LOCATION:  home   If you experience worsening of your admission symptoms, develop  shortness of breath, life threatening emergency, suicidal or homicidal thoughts you must seek medical attention immediately by calling 911 or calling your MD immediately  if symptoms less severe.  You Must read complete instructions/literature along with all the possible adverse reactions/side effects for all the Medicines you take and that have been prescribed to you. Take any new Medicines after you have completely understood and accpet all the possible adverse reactions/side effects.   Please note  You were cared for by a hospitalist during your hospital stay. If you have any questions about your discharge medications or the care you received while you were in the hospital after you are discharged, you can call the unit and asked to speak with the hospitalist on call if the hospitalist that took care of you is not available. Once you are discharged, your primary care physician will handle any further medical issues. Please note that NO REFILLS for any discharge medications will be authorized once you are discharged, as it is imperative that you return to your primary care physician (or establish a relationship with a primary care physician if you do not have one) for your aftercare needs so that they can reassess your need for medications and monitor your lab values.     Today  Chief Complaint  Patient presents with  . Code Stroke   Patient is resting comfortably.  Left-sided weakness is significantly improving.  Swallowing okay  ROS:  CONSTITUTIONAL: Denies fevers, chills. Denies any fatigue, weakness.  EYES: Denies blurry vision, double vision, eye pain. EARS, NOSE, THROAT: Denies tinnitus, ear pain, hearing loss. RESPIRATORY: Denies cough, wheeze, shortness of breath.  CARDIOVASCULAR: Denies chest pain, palpitations, edema.  GASTROINTESTINAL: Denies nausea, vomiting, diarrhea, abdominal pain. Denies bright red blood per rectum. GENITOURINARY: Denies dysuria, hematuria. ENDOCRINE:  Denies nocturia or thyroid problems. HEMATOLOGIC AND LYMPHATIC: Denies easy bruising or bleeding. SKIN: Denies rash or lesion. MUSCULOSKELETAL: Denies pain in neck, back, shoulder, knees, hips or arthritic symptoms.  NEUROLOGIC: Left upper extremity weakness is improving denies paralysis, paresthesias.  PSYCHIATRIC: Denies anxiety or depressive symptoms.   VITAL SIGNS:  Blood pressure (!) 139/95, pulse 95, temperature 97.9 F (36.6 C), temperature source Oral, resp. rate 18, height 5\' 5"  (1.651 m), weight 80.3 kg (177 lb 1.6 oz), SpO2 97 %.  I/O:    Intake/Output Summary (Last 24 hours) at 01/08/2017 1402 Last data filed at 01/08/2017 1049 Gross per 24 hour  Intake 360 ml  Output -  Net 360 ml    PHYSICAL EXAMINATION:  GENERAL:  66 y.o.-year-old patient lying in the bed with no acute distress.  EYES: Pupils equal, round, reactive to light and accommodation. No scleral icterus. Extraocular muscles intact.  HEENT: Head atraumatic, normocephalic. Oropharynx and nasopharynx clear.  NECK:  Supple, no jugular venous distention. No thyroid enlargement, no tenderness.  LUNGS: Normal breath sounds bilaterally, no wheezing, rales,rhonchi or crepitation. No use of accessory muscles of respiration.  CARDIOVASCULAR: S1, S2 normal. No murmurs, rubs, or gallops.  ABDOMEN: Soft, non-tender, non-distended. Bowel sounds present. No organomegaly or mass.  EXTREMITIES: No pedal edema, cyanosis, or clubbing.  NEUROLOGIC: Cranial nerves II through XII are intact. Muscle strength 5/5 in all extremities except left upper extremity 4 out of 5. Sensation intact. Gait not checked.  PSYCHIATRIC: The patient is alert and oriented x 3.  SKIN: No obvious rash, lesion, or ulcer.   DATA REVIEW:   CBC Recent Labs  Lab 01/06/17 0643  WBC 6.2  HGB 13.0  HCT 38.8  PLT 192    Chemistries  Recent Labs  Lab 01/05/17 2328 01/06/17 0643  NA 138  --   K 3.7  --   CL 106  --   CO2 26  --   GLUCOSE 109*   --   BUN 20  --   CREATININE 0.77 0.69  CALCIUM 9.2  --   AST 23  --   ALT 16  --   ALKPHOS 81  --   BILITOT 0.7  --     Cardiac Enzymes Recent Labs  Lab 01/05/17 2328  TROPONINI 0.13*    Microbiology Results  No results found for this or any previous visit.  RADIOLOGY:  Mr Virgel Paling CW Contrast  Result Date: 01/06/2017 CLINICAL DATA:  Severe onset LEFT arm and LEFT leg weakness beginning at 11 p.m. tonight. History of hypertension. EXAM: MRI HEAD WITHOUT CONTRAST MRA HEAD WITHOUT CONTRAST TECHNIQUE: Multiplanar, multiecho pulse sequences of the brain and surrounding structures were obtained without intravenous contrast. Angiographic images of the head were obtained using MRA technique without contrast. COMPARISON:  CT HEAD January 05, 2017 FINDINGS: MRI HEAD FINDINGS BRAIN: 9 x 6 x 13 mm reduced diffusion RIGHT lenticulostriate nucleus with low ADC values. No susceptibility artifact to suggest hemorrhage. Ventricles and sulci are normal for patient's age. Patchy supratentorial white matter FLAIR T2 hyperintensities. 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 No midline shift, mass effect or masses. VASCULAR: Normal major intracranial vascular flow voids present at skull base. SKULL AND UPPER CERVICAL SPINE: No abnormal sellar expansion. No suspicious calvarial bone marrow signal. Craniocervical junction maintained. SINUSES/ORBITS: Small bilateral maxillary mucosal retention cysts, mild paranasal sinus mucosal thickening. Mastoid air cells are well aerated. The included ocular globes and orbital contents are non-suspicious. OTHER: None. MRA HEAD FINDINGS- Mild motion degraded examination. ANTERIOR CIRCULATION: Flow related enhancement of the included cervical, petrous, cavernous and supraclinoid internal carotid arteries. Slight luminal irregularity LEFT proximal cavernous segment most compatible with tortuosity in artifact. Moderate stenosis LEFT carotid  terminus to proximal LEFT MCA. 1-2 mm inferiorly directed out patching bilateral supraclinoid internal carotid artery's. Patent anterior communicating artery. Patent anterior and middle cerebral arteries, including distal segments. However, there is less robust LEFT middle cerebral artery flow related enhancement as compared to the RIGHT. No large vessel occlusion, flow limiting stenosis, aneurysm. POSTERIOR CIRCULATION: LEFT vertebral artery is dominant. Basilar artery is patent, with normal flow related enhancement of the main branch vessels. Patent posterior cerebral arteries. Severe stenosis LEFT P1 origin and proximal RIGHT P2 segment. Severe stenosis proximal P3 segment. No large vessel occlusion, flow limiting stenosis,  aneurysm. ANATOMIC VARIANTS: Hypoplastic LEFT A1 segment. Source images and MIP images were reviewed. IMPRESSION: MRI HEAD: 1. 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 2. 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 Old small LEFT cerebellar infarct. 3. Moderate chronic small vessel ischemic disease. MRA HEAD: 1. No  emergent large vessel occlusion on this mildly motion degraded examination. 2. Moderate stenosis LEFT carotid terminus to proximal MCA. Slightly slower flow LEFT middle cerebral artery. 3. Severe stenosis bilateral posterior cerebral artery's most compatible with atherosclerosis. 4. 2 mm outpouching bilateral supraclinoid internal carotid artery's seen with posterior communicating artery infundibula versus aneurysms. Electronically Signed   By: Elon Alas M.D.   On: 01/06/2017 02:44   Mr Brain Wo Contrast (neuro Protocol)  Result Date: 01/06/2017 CLINICAL DATA:  Severe onset LEFT arm and LEFT leg weakness beginning at 11 p.m. tonight. History of hypertension. EXAM: MRI HEAD WITHOUT CONTRAST MRA HEAD WITHOUT CONTRAST TECHNIQUE: Multiplanar, multiecho pulse sequences of the brain and surrounding structures were obtained without intravenous contrast. Angiographic images  of the head were obtained using MRA technique without contrast. COMPARISON:  CT HEAD January 05, 2017 FINDINGS: MRI HEAD FINDINGS BRAIN: 9 x 6 x 13 mm reduced diffusion RIGHT lenticulostriate nucleus with low ADC values. No susceptibility artifact to suggest hemorrhage. Ventricles and sulci are normal for patient's age. Patchy supratentorial white matter FLAIR T2 hyperintensities. 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 No midline shift, mass effect or masses. VASCULAR: Normal major intracranial vascular flow voids present at skull base. SKULL AND UPPER CERVICAL SPINE: No abnormal sellar expansion. No suspicious calvarial bone marrow signal. Craniocervical junction maintained. SINUSES/ORBITS: Small bilateral maxillary mucosal retention cysts, mild paranasal sinus mucosal thickening. Mastoid air cells are well aerated. The included ocular globes and orbital contents are non-suspicious. OTHER: None. MRA HEAD FINDINGS- Mild motion degraded examination. ANTERIOR CIRCULATION: Flow related enhancement of the included cervical, petrous, cavernous and supraclinoid internal carotid arteries. Slight luminal irregularity LEFT proximal cavernous segment most compatible with tortuosity in artifact. Moderate stenosis LEFT carotid terminus to proximal LEFT MCA. 1-2 mm inferiorly directed out patching bilateral supraclinoid internal carotid artery's. Patent anterior communicating artery. Patent anterior and middle cerebral arteries, including distal segments. However, there is less robust LEFT middle cerebral artery flow related enhancement as compared to the RIGHT. No large vessel occlusion, flow limiting stenosis, aneurysm. POSTERIOR CIRCULATION: LEFT vertebral artery is dominant. Basilar artery is patent, with normal flow related enhancement of the main branch vessels. Patent posterior cerebral arteries. Severe stenosis LEFT P1 origin and proximal RIGHT P2 segment. Severe stenosis proximal P3  segment. No large vessel occlusion, flow limiting stenosis,  aneurysm. ANATOMIC VARIANTS: Hypoplastic LEFT A1 segment. Source images and MIP images were reviewed. IMPRESSION: MRI HEAD: 1. 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 2. 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 Old small LEFT cerebellar infarct. 3. Moderate chronic small vessel ischemic disease. MRA HEAD: 1. No emergent large vessel occlusion on this mildly motion degraded examination. 2. Moderate stenosis LEFT carotid terminus to proximal MCA. Slightly slower flow LEFT middle cerebral artery. 3. Severe stenosis bilateral posterior cerebral artery's most compatible with atherosclerosis. 4. 2 mm outpouching bilateral supraclinoid internal carotid artery's seen with posterior communicating artery infundibula versus aneurysms. Electronically Signed   By: Elon Alas M.D.   On: 01/06/2017 02:44   US Carotid Bilateral (at [facility] And [facility] Only)  Result Date: 01/06/2017 CLINICAL DATA:  66 year old female with a history of acute stroke. Cardiovascular risk factors include hypertension, known prior stroke/TIA, tobacco use EXAM: BILATERAL CAROTID DUPLEX ULTRASOUND TECHNIQUE: Pearline Cables scale imaging, color Doppler and duplex ultrasound were performed of bilateral carotid and vertebral arteries in the neck. COMPARISON:  No prior duplex FINDINGS: Criteria: Quantification of carotid stenosis is based on velocity parameters  that correlate the residual internal carotid diameter with NASCET-based stenosis levels, using the diameter of the distal internal carotid lumen as the denominator for stenosis measurement. The following velocity measurements were obtained: RIGHT ICA:  Systolic 70 cm/sec, Diastolic 28 cm/sec CCA:  77 cm/sec SYSTOLIC ICA/CCA RATIO:  0.9 ECA:  52 cm/sec LEFT ICA:  Systolic 43 cm/sec, Diastolic 14 cm/sec CCA:  75 cm/sec SYSTOLIC ICA/CCA RATIO:  0.6 ECA:  70 cm/sec Right Brachial SBP: Not acquired Left Brachial SBP: Not acquired RIGHT CAROTID  ARTERY: No significant calcified disease of the right common carotid artery. Intermediate waveform maintained. Heterogeneous plaque without significant calcifications at the right carotid bifurcation. Low resistance waveform of the right ICA. No significant tortuosity. RIGHT VERTEBRAL ARTERY: Antegrade flow with low resistance waveform. LEFT CAROTID ARTERY: No significant calcified disease of the left common carotid artery. Intermediate waveform maintained. Heterogeneous plaque at the left carotid bifurcation without significant calcifications. Low resistance waveform of the left ICA. LEFT VERTEBRAL ARTERY:  Antegrade flow with low resistance waveform. IMPRESSION: Color duplex indicates minimal heterogeneous plaque, with no hemodynamically significant stenosis by duplex criteria in the extracranial cerebrovascular circulation. Signed, Dulcy Fanny. Earleen Newport, DO [facility] Electronically Signed   By: Corrie Mckusick D.O.   On: 01/06/2017 15:31   Ct Head Code Stroke Wo Contrast  Result Date: 01/06/2017 CLINICAL DATA:  Code stroke. Acute onset LEFT-sided weakness and LEFT facial droop. EXAM: CT HEAD WITHOUT CONTRAST TECHNIQUE: Contiguous axial images were obtained from the base of the skull through the vertex without intravenous contrast. COMPARISON:  CT face December 23, 2016 FINDINGS: BRAIN: No intraparenchymal hemorrhage, mass effect nor midline shift. The ventricles and sulci are normal for age. Patchy supratentorial white matter hypodensities. No acute large vascular territory infarcts. No abnormal extra-axial fluid collections. Basal cisterns are patent. VASCULAR: Mild calcific atherosclerosis of the carotid siphons. Faintly dense intracranial vessels most compatible with hemoconcentration. SKULL: No skull fracture. Osteopenia. No significant scalp soft tissue swelling. SINUSES/ORBITS: The mastoid air-cells and included paranasal sinuses are well-aerated.The  included ocular globes and orbital contents are non-suspicious. OTHER: None. ASPECTS [facility] Stroke [facility] Early CT Score) - Ganglionic level infarction (caudate, lentiform nuclei, internal capsule, insula, M1-M3 cortex): 7 - Supraganglionic infarction (M4-M6 cortex): 3 Total score (0-10 with 10 being normal): 10 IMPRESSION: 1. No acute intracranial process. 2. Moderate chronic small vessel ischemic disease. 3. ASPECTS is 10. Critical Value/emergent results were called by telephone at the time of interpretation on 01/05/2017 at 11:48 pm to Dr. Darel Hong , who verbally acknowledged these results. Electronically Signed   By: Elon Alas M.D.   On: 01/06/2017 00:07    EKG:   Orders placed or performed during the hospital encounter of 01/05/17  . EKG 12-Lead  . EKG 12-Lead      Management plans discussed with the patient, she is in agreement.  CODE STATUS:     Code Status Orders  (From admission, onward)        Start     Ordered   01/06/17 0308  Full code  Continuous     01/06/17 0307    Code Status History    Date Active Date Inactive Code Status Order ID Comments User Context   This patient has a current code status but no historical code status.      TOTAL TIME TAKING CARE OF THIS PATIENT: 28minutes.   Note: This dictation was prepared with Dragon dictation along with smaller phrase technology. Any transcriptional errors  that result from this process are unintentional.   @M[facility] @  on 01/08/2017 at 2:02 PM  Between 7am to 6pm - Pager - [phone]  After 6pm go to [email or web address] - password [email or web address] [facility]  [facility]  Office  [phone]  CC: Primary care physician; Margarita Rana, MD

## 2017-01-08 NOTE — Plan of Care (Signed)
  Progressing Education: Knowledge of [place] Education information/materials will improve 01/08/2017 0246 - Progressing by Cathie Hoops, RN Safety: Ability to remain free from injury will improve 01/08/2017 0246 - Progressing by Cathie Hoops, RN Health Behavior/Discharge Planning: Ability to manage health-related needs will improve 01/08/2017 0246 - Progressing by Cathie Hoops, RN Pain Managment: General experience of comfort will improve 01/08/2017 0246 - Progressing by Cathie Hoops, RN Physical Regulation: Ability to maintain clinical measurements within normal limits will improve 01/08/2017 0246 - Progressing by Cathie Hoops, RN Will remain free from infection 01/08/2017 0246 - Progressing by Cathie Hoops, RN Tissue Perfusion: Risk factors for ineffective tissue perfusion will decrease 01/08/2017 0246 - Progressing by Cathie Hoops, RN Activity: Risk for activity intolerance will decrease 01/08/2017 0246 - Progressing by Cathie Hoops, RN Education: Knowledge of disease or condition will improve 01/08/2017 0246 - Progressing by Cathie Hoops, RN Knowledge of secondary prevention will improve 01/08/2017 0246 - Progressing by Cathie Hoops, RN Knowledge of patient specific risk factors addressed and post discharge goals established will improve 01/08/2017 0246 - Progressing by Cathie Hoops, RN Health Behavior/Discharge Planning: Ability to manage health-related needs will improve 01/08/2017 0246 - Progressing by Cathie Hoops, RN Self-Care: Ability to participate in self-care as condition permits will improve 01/08/2017 0246 - Progressing by Cathie Hoops, RN Ischemic Stroke/TIA Tissue Perfusion: Complications of ischemic stroke/TIA will be minimized 01/08/2017 0246 - Progressing by Cathie Hoops, RN

## 2017-01-08 NOTE — Care Management (Signed)
Patient to discharge home today.  PT has assessed patient and recommends outpatient PT with RW.  Jason from [place] to deliver RW to room prior to discharge.

## 2017-01-08 NOTE — Progress Notes (Signed)
Pt is being discharged today, discharge instructions reviewed with the patient and her husband. 3 paper prescriptions were given to her. IV x2 removed. All belongings packed and returned to the patient, she was rolled out in a wheelchair by staff.

## 2017-01-08 NOTE — Evaluation (Signed)
Speech Language Pathology Evaluation Patient Details Name: Angel Lane MRN: [id number] DOB: 02-Apr-1950 Today's Date: 01/08/2017 Time: 1230-1330 SLP Time Calculation (min) (ACUTE ONLY): 60 min  Problem List:  Patient Active Problem List   Diagnosis Date Noted  . Left hemiparesis ([place]) 01/06/2017  . HTN (hypertension) 01/06/2017  . Elevated troponin 01/06/2017  . CVA (cerebral vascular accident) ([place]) 01/06/2017   Past Medical History:  Past Medical History:  Diagnosis Date  . Hypertension    Past Surgical History:  Past Surgical History:  Procedure Laterality Date  . ABDOMINAL SURGERY    . CHOLECYSTECTOMY     HPI:  Pt is a 66 y.o. female w/ h/o HTN who comes to the emergency department with severe sudden onset left arm and left leg weakness that began roughly at 11 PM about 20 minutes prior to arrival.  She was in the bathroom and when she came out she told her husband that she could not walk in her left arm felt heavy and numb.  Symptoms began suddenly and have been slowly progressive.  She takes no chronic medications.  She has never had a stroke or heart attack before.  She has no pain.  Her husband said her face "does not look right"; min weakness on Left side.  The patient is able to speak appropriately.  She has no visual issues or swallowing issues.  She is A/O x3 and engaging w/ family members in the room.  Pt stated she continues to have no difficulty swallowing or speaking w/ others; noted slight Left labial-facial weakness - has LUE weakness.   Assessment / Plan / Recommendation Clinical Impression  Pt appears to present w/ only slight-min decreased Left labial-facial decreased tone post R CVA(Acute RIGHT basal ganglia 9 x 6 x 13 mm infarct) as assessed by informal assessment at bedside today. Pt exhibits no overt dysarthria; speech is 100% intelligible. Pt does present w/ slight-min decreased tone in the upper Left labial-facial area. With increased effort, pt can follow  through w/ OM tasks of protrusion/retraction fully. Pt does c/o trace amount of food/liquid residue at Left corner of mouth - educated on strategy of wiping mouth regularly during meals to address. Pt does have slight decreased sensation in Left corner of mouth but no other s/s of oropharyngeal phase deficits; no dysphagia per pt/staff/husband(pt consumed sips of liquids during this assessment). No other motor issues noted; no cognitive-linguistic deficits noted. Thorough instruction given to pt and family on OMEs and tasks to improve ROM/tone in Left labial-facial area via speech and verbal exercises; education on OM issues post CVA in general. Pt to practice OM exercises given and f/u w/ PCP post discharge. Husband and pt agreed; handouts given. NSG updated.     SLP Assessment  SLP Recommendation/Assessment: All further Speech Lanaguage Pathology  needs can be addressed in the next venue of care(if determined indicated) SLP Visit Diagnosis: Dysarthria and anarthria (R47.1)(decreased L tone; no impact on speech intelligibility)    Follow Up Recommendations  (TBD)    Frequency and Duration (n/a)  (n/a)      SLP Evaluation Cognition  Overall Cognitive Status: Within Functional Limits for tasks assessed Arousal/Alertness: Awake/alert Orientation Level: Oriented X4 Attention: Focused;Sustained Focused Attention: Appears intact Sustained Attention: Appears intact Memory: Appears intact Awareness: Appears intact Problem Solving: Appears intact Executive Function: (appeared wfl ) Behaviors: (none) Safety/Judgment: Appears intact       Comprehension  Auditory Comprehension Overall Auditory Comprehension: Appears within functional limits for tasks assessed Conversation: Complex  Interfering Components: (none) Reading Comprehension Reading Status: Not tested    Expression Expression Primary Mode of Expression: Verbal Verbal Expression Overall Verbal Expression: Appears within functional  limits for tasks assessed Initiation: No impairment Level of Generative/Spontaneous Verbalization: Conversation Repetition: No impairment Pragmatics: No impairment Interfering Components: (none) Non-Verbal Means of Communication: Not applicable Written Expression Dominant Hand: Right Written Expression: Not tested   Oral / Motor  Oral Motor/Sensory Function Overall Oral Motor/Sensory Function: (slight-min Left labial-facial decreased tone) Motor Speech Overall Motor Speech: Impaired Respiration: Within functional limits Phonation: Normal Resonance: Within functional limits Articulation: Within functional limitis Intelligibility: Intelligible Motor Planning: Witnin functional limits Motor Speech Errors: Not applicable Interfering Components: (none)   GO          Functional Assessment Tool Used: clinical judgement Functional Limitations: Other Speech Language Pathology Other Speech-Language Pathology Functional Limitation Current Status [phone]): At least 1 percent but less than 20 percent impaired, limited or restricted Other Speech-Language Pathology Functional Limitation Goal Status [phone]): At least 1 percent but less than 20 percent impaired, limited or restricted Other Speech-Language Pathology Functional Limitation Discharge Status [phone]): At least 1 percent but less than 20 percent impaired, limited or restricted           Orinda Kenner, [place], CCC-SLP Watson,Katherine 01/08/2017, 4:25 PM

## 2017-01-08 NOTE — Discharge Instructions (Signed)
Follow-up with primary care physician 5-7 days Follow-up with neurology in 3-4 weeks Outpatient physical therapy

## 2017-01-08 NOTE — Progress Notes (Signed)
Occupational Therapy Treatment Patient Details Name: Angel Lane MRN: [id number] DOB: [birth year] Today's Date: 01/08/2017    History of present illness Pt admitted for R CVA with L side weakness. History includes previous L CVA and HTN.    OT comments  Pt seen for OT treatment focused on [facility] exercises. Pt educated in [facility] exercises for L hand and pt able to return demonstrate good technique. Handout provided. Pt strength improving, L grip now 4/5, L shoulder now 4-/5. Recommendation updated to outpatient OT, as pt has made substantial improvement. Pt would benefit from outpatient OT to continue addressing LUE deficits in order to maximize return to PLOF.     Follow Up Recommendations  Outpatient OT    Equipment Recommendations  None recommended by OT    Recommendations for Other Services      Precautions / Restrictions Precautions Precautions: Fall Restrictions Weight Bearing Restrictions: No       Mobility Bed Mobility Overal bed mobility: Needs Assistance Bed Mobility: Supine to Sit;Sit to Supine     Supine to sit: Supervision Sit to supine: Supervision   General bed mobility comments: continues to improve with mobility no assist required for LLE mgt  Transfers Overall transfer level: Needs assistance Equipment used: Rolling walker (2 wheeled) Transfers: Sit to/from Stand Sit to Stand: Min guard              Balance Overall balance assessment: Needs assistance Sitting-balance support: Feet supported Sitting balance-Leahy Scale: Good     Standing balance support: Bilateral upper extremity supported Standing balance-Leahy Scale: Fair                             ADL either performed or assessed with clinical judgement   ADL Overall ADL's : Needs assistance/impaired Eating/Feeding: Modified independent   Grooming: Sitting;Set up   Upper Body Bathing: Sitting;Set up;Supervision/ safety   Lower Body Bathing: Sit to/from stand;Min  guard;Set up   Upper Body Dressing : Set up;Supervision/safety;Sitting   Lower Body Dressing: Sit to/from stand;Min guard;Set up   Toilet Transfer: Ambulation;Min guard;Comfort height toilet;RW                   Vision Baseline Vision/History: Wears glasses Wears Glasses: At all times Patient Visual Report: No change from baseline Vision Assessment?: No apparent visual deficits   Perception     Praxis      Cognition Arousal/Alertness: Awake/alert Behavior During Therapy: WFL for tasks assessed/performed Overall Cognitive Status: Within Functional Limits for tasks assessed                                 General Comments: occasional verbal cues for safety, slightly impaired insight into deficits        Exercises Other Exercises Other Exercises: Pt educated in [facility] exercises for L hand and pt able to return demonstrate good technique. Handout provided. Pt strength improving, L grip now 4/5, L shoulder now 4-/5   Shoulder Instructions       General Comments      Pertinent Vitals/ Pain       Pain Assessment: No/denies pain  Home Living                                          Prior Functioning/Environment  Frequency  Min 1X/week        Progress Toward Goals  OT Goals(current goals can now be found in the care plan section)  Progress towards OT goals: Progressing toward goals  Acute Rehab OT Goals Patient Stated Goal: wants to go home OT Goal Formulation: With patient/family Time For Goal Achievement: 01/20/17 Potential to Achieve Goals: Good  Plan Discharge plan needs to be updated;Frequency needs to be updated    Co-evaluation                 AM-PAC PT "6 Clicks" Daily Activity     Outcome Measure   Help from another person eating meals?: None Help from another person taking care of personal grooming?: None Help from another person toileting, which includes using toliet, bedpan, or  urinal?: A Little Help from another person bathing (including washing, rinsing, drying)?: A Little Help from another person to put on and taking off regular upper body clothing?: None Help from another person to put on and taking off regular lower body clothing?: A Little 6 Click Score: 21    End of Session    OT Visit Diagnosis: Other abnormalities of gait and mobility (R26.89);Hemiplegia and hemiparesis;Other symptoms and signs involving cognitive function Hemiplegia - Right/Left: Left Hemiplegia - dominant/non-dominant: Dominant Hemiplegia - caused by: Cerebral infarction   Activity Tolerance Patient tolerated treatment well   Patient Left in bed;with call bell/phone within reach;with bed alarm set   Nurse Communication          Time: 1000-1014 OT Time Calculation (min): 14 min  Charges: OT Treatments $Therapeutic Exercise: 8-22 mins  Jeni Salles, MPH, MS, OTR/L ascom [phone] 01/08/17, 11:24 AM

## 2017-01-08 NOTE — Progress Notes (Signed)
[facility] - Patient is making good progress.  Noted PT now recommending outpatient therapy.  Agree with need for outpatient therapy.  Is not doing too well to meet criteria for an acute inpatient rehab admission.  Call me for questions.  #[phone]

## 2017-01-09 ENCOUNTER — Telehealth: Payer: Self-pay | Admitting: Family Medicine

## 2017-01-09 NOTE — Telephone Encounter (Signed)
Pt was discharged from [facility] on 01/08/17 for a stroke.  I have scheduled a hospital follow up appointment/MW

## 2017-01-09 NOTE — Telephone Encounter (Signed)
Can you make TIC call? Thanks!

## 2017-01-12 NOTE — Telephone Encounter (Signed)
Pt has not r/c from 01/09/17 or 01/10/17. Two voicemail's were left requesting a call back. Pt has a f/u apt scheduled with PCP for 01/15/17. FYI! -MM

## 2017-01-15 ENCOUNTER — Encounter: Payer: Self-pay | Admitting: Family Medicine

## 2017-01-15 ENCOUNTER — Ambulatory Visit (INDEPENDENT_AMBULATORY_CARE_PROVIDER_SITE_OTHER): Payer: Federal, State, Local not specified - PPO | Admitting: Family Medicine

## 2017-01-15 VITALS — BP 142/90 | HR 84 | Temp 97.8°F | Resp 16 | Ht 65.0 in | Wt 177.0 lb

## 2017-01-15 DIAGNOSIS — I10 Essential (primary) hypertension: Secondary | ICD-10-CM | POA: Diagnosis not present

## 2017-01-15 DIAGNOSIS — Z720 Tobacco use: Secondary | ICD-10-CM | POA: Diagnosis not present

## 2017-01-15 DIAGNOSIS — H538 Other visual disturbances: Secondary | ICD-10-CM | POA: Diagnosis not present

## 2017-01-15 DIAGNOSIS — G8194 Hemiplegia, unspecified affecting left nondominant side: Secondary | ICD-10-CM | POA: Diagnosis not present

## 2017-01-15 DIAGNOSIS — I639 Cerebral infarction, unspecified: Secondary | ICD-10-CM

## 2017-01-15 MED ORDER — HYDROCHLOROTHIAZIDE 12.5 MG PO TABS
12.5000 mg | ORAL_TABLET | Freq: Every day | ORAL | 2 refills | Status: DC
Start: 2017-01-15 — End: 2017-02-19

## 2017-01-15 NOTE — Assessment & Plan Note (Signed)
Doing well with no new neuro signs Advised to keep appts with neurology and PT as scheduled Discussed importance of BP, cholesterol control Continue aspirin, lipitor BP control as above

## 2017-01-15 NOTE — Assessment & Plan Note (Signed)
Stable Discussed importance of PT

## 2017-01-15 NOTE — Patient Instructions (Signed)
Ischemic Stroke °An ischemic stroke is the sudden death of brain tissue. Blood carries oxygen to all areas of the body. This type of stroke happens when your blood does not flow to your brain like normal. Your brain cannot get the oxygen it needs. This is an emergency. It must be treated right away. °Symptoms of a stroke usually happen all of a sudden. You may notice them when you wake up. They can include: °· Weakness or loss of feeling in your face, arm, or leg. This often happens on one side of the body. °· Trouble walking. °· Trouble moving your arms or legs. °· Loss of balance or coordination. °· Feeling confused. °· Trouble talking or understanding what people are saying. °· Slurred speech. °· Trouble seeing. °· Seeing two of one object (double vision). °· Feeling dizzy. °· Feeling sick to your stomach (nauseous) and throwing up (vomiting). °· A very bad headache for no reason. ° °Get help as soon as any of these problems start. This is important. Some treatments work better if they are given right away. These include: °· Aspirin. °· Medicines to control blood pressure. °· A shot (injection) of medicine to break up the blood clot. °· Treatments given in the blood vessel (artery) to take out the clot or break it up. ° °Other treatments may include: °· Oxygen. °· Fluids given through an IV tube. °· Medicines to thin out your blood. °· Procedures to help your blood flow better. ° °What increases the risk? °Certain things may make you more likely to have a stroke. Some of these are things that you can change, such as: °· Being very overweight (obesity). °· Smoking. °· Taking birth control pills. °· Not being active. °· Drinking too much alcohol. °· Using drugs. ° °Other risk factors include: °· High blood pressure. °· High cholesterol. °· Diabetes. °· Heart disease. °· Being African American, Native American, Hispanic, or [place] Native. °· Being over age 60. °· Family history of stroke. °· Having had blood clots,  stroke, or warning stroke (transient ischemic attack, TIA) in the past. °· Sickle cell disease. °· Being a woman with a history of high blood pressure in pregnancy (preeclampsia). °· Migraine headache. °· Sleep apnea. °· Having an irregular heartbeat (atrial fibrillation). °· Long-term (chronic) diseases that cause soreness and swelling (inflammation). °· Disorders that affect how your blood clots. ° °Follow these instructions at home: °Medicines °· Take over-the-counter and prescription medicines only as told by your doctor. °· If you were told to take aspirin or another medicine to thin your blood, take it exactly as told by your doctor. °? Taking too much of the medicine can cause bleeding. °? If you do not take enough, it may not work as well. °· Know the side effects of your medicines. If you are taking a blood thinner, make sure you: °? Hold pressure over any cuts for longer than usual. °? Tell your dentist and other doctors that you take this medicine. °? Avoid activities that may cause damage or injury to your body. °Eating and drinking °· Follow instructions from your doctor about what you cannot eat or drink. °· Eat healthy foods. °· If you have trouble with swallowing, do these things to avoid choking: °? Take small bites when eating. °? Eat foods that are soft or pureed. °Safety °· Follow instructions from your health care team about physical activity. °· Use a walker or cane as told by your doctor. °· Keep your home safe so   you do not fall. This may include: °? Having experts look at your home to make sure it is safe. °? Putting grab bars in the bedroom and bathroom. °? Using raised toilets. °? Putting a seat in the shower. °General instructions °· Do not use any tobacco products. °? Examples of these are cigarettes, chewing tobacco, and e-cigarettes. °? If you need help quitting, ask your doctor. °· Limit how much alcohol you drink. This means no more than 1 drink a day for nonpregnant women and 2  drinks a day for men. One drink equals 12 oz of beer, 5 oz of wine, or 1½ oz of hard liquor. °· If you need help to stop using drugs or alcohol, ask your doctor to refer you to a program or specialist. °· Stay active. Exercise as told by your doctor. °· Keep all follow-up visits as told by your doctor. This is important. °Get help right away if: °· You suddenly: °? Have weakness or loss of feeling in your face, arm, or leg. °? Feel confused. °? Have trouble talking or understanding what people are saying. °? Have trouble seeing. °? Have trouble walking. °? Have trouble moving your arms or legs. °? Feel dizzy. °? Lose your balance or coordination. °? Have a very bad headache and you do not know why. °· You pass out (lose consciousness) or almost pass out. °· You have jerky movements that you cannot control (seizure). °These symptoms may be an emergency. Do not wait to see if the symptoms will go away. Get medical help right away. Call your local emergency services (911 in the [place] Do not drive yourself to the hospital. °This information is not intended to replace advice given to you by your health care provider. Make sure you discuss any questions you have with your health care provider. °Document Released: 02/09/2011 Document Revised: 08/03/2015 Document Reviewed: 05/19/2015 °Elsevier Interactive Patient Education © 2018 [employer] ° °

## 2017-01-15 NOTE — Assessment & Plan Note (Signed)
Per patient, present prior to hospitalization Could be related to previous left sided strokes visible on MRI or related to Ophthalmologic etiology Referral to Optho for further evaluation

## 2017-01-15 NOTE — Assessment & Plan Note (Signed)
Discussion of ~3-4 minutes regarding importance of tobacco cessation, especially following stroke Continue to wean down on patches Info given about quitline F/u in 4 weeks

## 2017-01-15 NOTE — Progress Notes (Signed)
Patient: Angel Lane Female    DOB: [birth year]   66 y.o.   MRN: [id number] Visit Date: 01/15/2017  Today's Provider: Lavon Paganini, MD   Chief Complaint  Patient presents with  . Hospitalization Follow-up   Subjective:    HPI   Follow up Hospitalization  Patient was admitted to [facility] on 01/05/2017 and discharged on 01/08/2017. She was treated for acute ischemic stroke. Treatment for this included starting aspirin 325 mg, Lipitor 40 mg, metoprolol 25 mg, Nicoderm. Telephone follow up was attempted on 01/09/2017, but pt did not call office back for [facility] call. She reports good compliance with treatment. She reports this condition is Improved. She is still c/o left arm weakness, left sided numbness of face.  She states the beta blocker is causing constipation, and the Lipitor is causing dizziness and decreased energy.  States that L arm and leg continue to feels heavy.  She has been having L eye blurriness since prior to hospitalization and it is bothersome to her.  It can make her dizzy.  No new neurologic symptoms  Starts rehab on Friday (11/16).  Follow up with Neurology scheduled for 11/28.  Feels energy level is low since hospitalization  Home BP readings are running: 135/80-145/80s Not taking HCTZ for several years, though it is on medication list. Good compliance with other medications  Trying to quit smoking.  Has cut back to 1-2 cigarettes per day on 21mg  Nicotine patch.  States she has quit before with patches. ------------------------------------------------------------------------------------    Allergies  Allergen Reactions  . Penicillins Other (See Comments)    chills     Current Outpatient Medications:  .  aspirin EC 325 MG EC tablet, Take 1 tablet (325 mg total) daily by mouth., Disp: 30 tablet, Rfl: 0 .  atorvastatin (LIPITOR) 40 MG tablet, Take 1 tablet (40 mg total) daily at 6 PM by mouth., Disp: 30 tablet, Rfl: 0 .  fluticasone  (FLONASE) 50 MCG/ACT nasal spray, Place 2 sprays daily into both nostrils., Disp: 1 g, Rfl: 2 .  metoprolol tartrate (LOPRESSOR) 25 MG tablet, Take 1 tablet (25 mg total) 2 (two) times daily by mouth., Disp: 60 tablet, Rfl: 0 .  nicotine (NICODERM CQ - DOSED IN MG/24 HOURS) 21 mg/24hr patch, Place 1 patch (21 mg total) daily onto the skin., Disp: 28 patch, Rfl: 0 .  hydrochlorothiazide (HYDRODIURIL) 12.5 MG tablet, Take 1 tablet (12.5 mg total) daily by mouth., Disp: 30 tablet, Rfl: 2  Review of Systems  Constitutional: Positive for activity change and fatigue. Negative for appetite change, chills and fever.  HENT: Negative.   Respiratory: Positive for apnea (uncontrolled; is not using CPAP because it caused a sinus infection.).   Cardiovascular: Negative for chest pain, palpitations and leg swelling.  Gastrointestinal: Positive for constipation. Negative for anal bleeding, blood in stool, diarrhea and nausea.  Genitourinary: Negative.   Musculoskeletal: Positive for arthralgias.  Neurological: Positive for dizziness, weakness and numbness. Negative for syncope, speech difficulty, light-headedness and headaches.  Psychiatric/Behavioral: Negative.     Social History   Tobacco Use  . Smoking status: Former Smoker    Packs/day: 0.00    Years: 40.00    Pack years: 0.00    Types: Cigarettes    Last attempt to quit: 01/09/2017    Years since quitting: 0.0  . Smokeless tobacco: Never Used  . Tobacco comment: is currently on Nicoderm patch  Substance Use Topics  . Alcohol use: No   Objective:  BP (!) 142/90 (BP Location: Left Arm, Patient Position: Sitting, Cuff Size: Large)   Pulse 84   Temp 97.8 F (36.6 C) (Oral)   Resp 16   Ht 5\' 5"  (1.651 m)   Wt 177 lb (80.3 kg)   BMI 29.45 kg/m  Vitals:   01/15/17 0846  BP: (!) 142/90  Pulse: 84  Resp: 16  Temp: 97.8 F (36.6 C)  TempSrc: Oral  Weight: 177 lb (80.3 kg)  Height: 5\' 5"  (1.651 m)     Physical Exam  Constitutional:  She is oriented to person, place, and time. She appears well-developed and well-nourished. No distress.  HENT:  Head: Normocephalic and atraumatic.  Right Ear: External ear normal.  Left Ear: External ear normal.  Nose: Nose normal.  Mouth/Throat: Oropharynx is clear and moist.  Eyes: Conjunctivae and EOM are normal. Pupils are equal, round, and reactive to light. No scleral icterus.  Neck: Neck supple. No JVD present. No thyromegaly present.  Cardiovascular: Normal rate, regular rhythm, normal heart sounds and intact distal pulses.  No murmur heard. Pulmonary/Chest: Effort normal and breath sounds normal. No respiratory distress. She has no wheezes. She has no rales.  Musculoskeletal: She exhibits no edema or deformity.  Lymphadenopathy:    She has no cervical adenopathy.  Neurological: She is alert and oriented to person, place, and time.  CN intact, with exception of decreased sensation to light touch over L face, no facial asymmetry.  Strength 5/5 in RLE, RUE, 4/5 in LUE, LLE. Walks well with rolling walker. Sensation intact to light touch over extremities FNF intact  Skin: Skin is warm and dry. No rash noted.  Psychiatric: She has a normal mood and affect. Her behavior is normal.  Vitals reviewed.       Assessment & Plan:     Problem List Items Addressed This Visit      Cardiovascular and Mediastinum   HTN (hypertension)    Uncontrolled with elevated home readings as well Discussed need for tight control to reduce risk of another stroke Continue Metoprolol Add HCTZ 12.5 mg daily Recent BMP wnl F/u in 4 weeks, repeat BMP at that time      Relevant Medications   hydrochlorothiazide (HYDRODIURIL) 12.5 MG tablet   CVA (cerebral vascular accident) ([place]) - Primary    Doing well with no new neuro signs Advised to keep appts with neurology and PT as scheduled Discussed importance of BP, cholesterol control Continue aspirin, lipitor BP control as above      Relevant  Medications   hydrochlorothiazide (HYDRODIURIL) 12.5 MG tablet   Other Relevant Orders   Ambulatory referral to Ophthalmology     Nervous and Auditory   Left hemiparesis (HCC)    Stable Discussed importance of PT        Other   Tobacco abuse disorder    Discussion of ~3-4 minutes regarding importance of tobacco cessation, especially following stroke Continue to wean down on patches Info given about quitline F/u in 4 weeks      Blurry vision    Per patient, present prior to hospitalization Could be related to previous left sided strokes visible on MRI or related to Ophthalmologic etiology Referral to Optho for further evaluation      Relevant Orders   Ambulatory referral to Ophthalmology      Return in about 4 weeks (around 02/12/2017) for BP f/u.     The entirety of the information documented in the History of Present Illness, Review of Systems and  Physical Exam were personally obtained by me. Portions of this information were initially documented by Raquel Sarna Ratchford, CMA and reviewed by me for thoroughness and accuracy.     Lavon Paganini, MD  [facility]

## 2017-01-15 NOTE — Assessment & Plan Note (Signed)
Uncontrolled with elevated home readings as well Discussed need for tight control to reduce risk of another stroke Continue Metoprolol Add HCTZ 12.5 mg daily Recent BMP wnl F/u in 4 weeks, repeat BMP at that time

## 2017-01-19 ENCOUNTER — Ambulatory Visit: Payer: Federal, State, Local not specified - PPO | Attending: Family Medicine

## 2017-01-19 ENCOUNTER — Encounter: Payer: Self-pay | Admitting: Occupational Therapy

## 2017-01-19 ENCOUNTER — Ambulatory Visit: Payer: Federal, State, Local not specified - PPO | Admitting: Occupational Therapy

## 2017-01-19 DIAGNOSIS — R269 Unspecified abnormalities of gait and mobility: Secondary | ICD-10-CM

## 2017-01-19 DIAGNOSIS — R278 Other lack of coordination: Secondary | ICD-10-CM | POA: Insufficient documentation

## 2017-01-19 DIAGNOSIS — R279 Unspecified lack of coordination: Secondary | ICD-10-CM | POA: Diagnosis present

## 2017-01-19 DIAGNOSIS — M6281 Muscle weakness (generalized): Secondary | ICD-10-CM | POA: Insufficient documentation

## 2017-01-19 NOTE — Therapy (Signed)
[place] MAIN [facility] SERVICES [address] [place], [place], [address] Phone: [phone]   Fax:  [phone]  Physical Therapy Evaluation  Patient Details  Name: Angel Lane MRN: [id number] Date of Birth: 1950-03-28 Referring Provider: Lavon Paganini   Encounter Date: 01/19/2017  PT End of Session - 01/19/17 1101    Visit Number  1    Number of Visits  16    Date for PT Re-Evaluation  03/16/17    Authorization - Visit Number  1    Authorization - Number of Visits  10    PT Start Time  0945    PT Stop Time  3382    PT Time Calculation (min)  56 min    Equipment Utilized During Treatment  Gait belt    Activity Tolerance  Patient tolerated treatment well    Behavior During Therapy  [facility] for tasks assessed/performed       Past Medical History:  Diagnosis Date  . Hypertension     Past Surgical History:  Procedure Laterality Date  . ABDOMINAL SURGERY    . CHOLECYSTECTOMY      There were no vitals filed for this visit.   Subjective Assessment - 01/19/17 1001    Subjective  Patient is a pleasant 66 year old female post R CVA with L hemiparesis.     Pertinent History  Pt admitted to hospital for R CVA with L side weakness on 01/05/17 and discharged 01/08/17. History includes previous L CVA and HTN. Since then she has been moving around at home and trying to get herself stronger. Walked into session with cane, but doesn't always use it. Gets pain in L groin when walking 2/10 pain. Patient wants to return to previous level of function, such as walking and playing with her three dogs.     Limitations  Standing;Walking;House hold activities;Other (comment)    How long can you sit comfortably?  N/A    How long can you stand comfortably?  3-4 minutes, Left leg starts burning    How long can you walk comfortably?  3-4 minutes    Patient Stated Goals  return to previous level of function.     Currently in Pain?  No/denies Do get 2/10  pain with walking in L groin            [facility] PT Assessment - 01/19/17 0001      Assessment   Medical Diagnosis  R CVA with L hemiparesis    Referring Provider  Lavon Paganini    Onset Date/Surgical Date  01/05/17    Hand Dominance  Right    Next MD Visit  02/18/17    Prior Therapy  in hospital      Precautions   Precautions  None      Restrictions   Weight Bearing Restrictions  No      Balance Screen   Has the patient fallen in the past 6 months  No    Has the patient had a decrease in activity level because of a fear of falling?   Yes    Is the patient reluctant to leave their home because of a fear of falling?   Yes      [place] residence    Living Arrangements  Spouse/significant other    Available Help at Discharge  Family    Type of [place] to enter  Entrance Stairs-Number of Steps  4 from back, 3 steps from front    Entrance Stairs-Rails  Right;Left;Can reach both    Home Layout  One level    [employer] - single point;Walker - standard;Toilet riser;Shower seat      Prior Function   Level of Independence  Independent    Vocation  Retired    Leisure  TV, play with dogs      Cognition   Overall Cognitive Status  Within Functional Limits for tasks assessed    Memory  Impaired    Memory Impairment  Decreased short term memory      Observation/Other Assessments   Observations  Decreased weight shift to LLE.     Focus on Therapeutic Outcomes (FOTO)   LEFS      Sensation   Light Touch  Appears Intact      Coordination   Gross Motor Movements are Fluid and Coordinated  No    Coordination and Movement Description  Decreased Coordination with LLE and foot      Posture/Postural Control   Posture/Postural Control  Postural limitations    Postural Limitations  Weight shift right      Flexibility   Soft Tissue Assessment /Muscle Length  yes    Quadriceps  Tight bilaterally with  L>R      Bed Mobility   Bed Mobility  Supine to Sit;Sit to Supine;Sitting - Scoot to Edge of Bed    Supine to Sit  6: Modified independent (Device/Increase time)    Sitting - Scoot to Edge of Bed  6: Modified independent (Device/Increase time)    Sit to Supine  6: Modified independent (Device/Increase time)      Transfers   Transfers  Sit to Stand;Stand to Sit    Sit to Stand  With upper extremity assist;6: Modified independent (Device/Increase time);With armrests    Five time sit to stand comments   13 seconds w/ UE support, 24 seconds w/o UE support    Stand to Sit  6: Modified independent (Device/Increase time);With upper extremity assist      Ambulation/Gait   Ambulation/Gait  Yes    Ambulation/Gait Assistance  5: Supervision;4: Min guard    Assistive device  None but has cane for unstable surfaces/long distances/public amb    Gait Pattern  Decreased arm swing - left;Decreased stance time - left;Decreased dorsiflexion - left;Decreased weight shift to left;Lateral trunk lean to right;Poor foot clearance - left    Ambulation Surface  Level;Indoor    Gait velocity  .714      Balance   Balance Assessed  Yes      Standardized Balance Assessment   Standardized Balance Assessment  Berg Balance Test      Berg Balance Test   Sit to Stand  Able to stand without using hands and stabilize independently    Standing Unsupported  Able to stand safely 2 minutes    Sitting with Back Unsupported but Feet Supported on Floor or Stool  Able to sit safely and securely 2 minutes    Stand to Sit  Sits safely with minimal use of hands    Transfers  Able to transfer safely, definite need of hands    Standing Unsupported with Eyes Closed  Able to stand 10 seconds with supervision    Standing Ubsupported with Feet Together  Able to place feet together independently and stand for 1 minute with supervision    From Standing, Reach Forward with Outstretched Arm  Can  reach forward >12 cm safely (5")    From  Standing Position, Pick up Object from [place] to pick up shoe, needs supervision    From Standing Position, Turn to Look Behind Over each Shoulder  Looks behind one side only/other side shows less weight shift    Turn 360 Degrees  Able to turn 360 degrees safely but slowly    Standing Unsupported, Alternately Place Feet on Step/Stool  Able to stand independently and complete 8 steps >20 seconds    Standing Unsupported, One Foot in Front  Able to plae foot ahead of the other independently and hold 30 seconds    Standing on One Leg  Able to lift leg independently and hold 5-10 seconds    Total Score  45         PAIN: Walking: 2/10  POSTURE: R lean , decreased weight bearing upon LLE. Corrected with verbalization/cueing  PROM/AROM: Decreased hip flexor ROM due to tightness Decreased hip abduction due to pain in L groin  STRENGTH:  Graded on a 0-5 scale Muscle Group Left Right  Shoulder flex    Shoulder Abd    Shoulder Ext    Shoulder IR/ER    Elbow    Wrist/hand    Hip Flex 4/5 5/5  Hip Abd 2+/5 (pain limit full ROM) 4+/5  Hip Add 3+/5 painful 5/5  Hip Ext 2+/5 4+/5  Hip IR/ER 2+/5 4+/5  Knee Flex 4/5 5/5  Knee Ext 4/5 5/5  Ankle DF 4-/5 5/5  Ankle PF 4/5 5/5   SENSATION:  WFL for touch sensitivity   FUNCTIONAL MOBILITY: Mod I for bed mobility due to increased time Ambulates without cane, however requires cane for unstable surfaces, around crowds, and when tired.   BALANCE: Limited weight shift over LLE, balance is fair however slow and requires more assistance to return to [facility] after shifting over left side.    OUTCOME MEASURES: TEST Outcome Interpretation  5 times sit<>stand 13 sec w hands 24 seconds w/o hands >60 yo, >15 sec indicates increased risk for falls  10 meter walk test         .714    m/s <1.0 m/s indicates increased risk for falls; limited community ambulator  Timed up and Go   16              sec <14 sec indicates increased risk for falls   LEFS 50/80   Berg Balance Assessment 45/56 <36/56 (100% risk for falls), 37-45 (80% risk for falls); 46-51 (>50% risk for falls); 52-55 (lower risk <25% of falls)         Treat:  Standing marches 10x Standing hip flexion 10x Standing hip extension 10x Standing hip abduction 10x Standing lateral weight shift 10x        Objective measurements completed on examination: See above findings.              PT Education - 01/19/17 1101    Education provided  Yes    Education Details  HEP, weight acceptance onto LLE, POC    Person(s) Educated  Patient    Methods  Explanation;Demonstration;Verbal cues;Handout    Comprehension  Verbalized understanding;Returned demonstration       PT Short Term Goals - 01/19/17 1106      PT SHORT TERM GOAL #1   Title  Patient will be independent in home exercise program to improve strength/mobility for better functional independence with ADLs.    Baseline  HEP given  Time  2    Period  Weeks    Status  New    Target Date  02/02/17      PT SHORT TERM GOAL #2   Title  Patient (> 65 years old) will complete five times sit to stand test in < 20 seconds indicating an increased LE strength and improved balance.    Baseline  11/16: 24 seconds w/o hands    Time  2    Period  Weeks    Status  New    Target Date  02/02/17      PT SHORT TERM GOAL #3   Title   Patient will be independent in bending down towards floor and picking up small object (<5 pounds) and then stand back up without loss of balance as to improve ability to pick up and clean up room at home    Baseline  Pt. requires supervision to pick up objects from floor    Time  2    Period  Weeks    Status  New    Target Date  02/02/17        PT Long Term Goals - 01/19/17 1108      PT LONG TERM GOAL #1   Title   Patient (> 77 years old) will complete five times sit to stand test in < 15 seconds indicating an increased LE strength and improved balance.    Baseline  11/16:  24 seconds w/o hands    Time  8    Period  Weeks    Status  New    Target Date  03/16/17      PT LONG TERM GOAL #2   Title  Patient will increase Berg Balance score by > 6 points (51/56) to demonstrate decreased fall risk during functional activities.    Baseline  11/16: 45/56    Time  8    Period  Weeks    Status  New    Target Date  03/16/17      PT LONG TERM GOAL #3   Title  Patient will increase 10 meter walk test to >1.29m/s as to improve gait speed for better community ambulation and to reduce fall risk.    Baseline  11/16: .714    Time  8    Period  Weeks    Status  New    Target Date  03/16/17      PT LONG TERM GOAL #4   Title  Patient will reduce timed up and go to <11 seconds to reduce fall risk and demonstrate improved transfer/gait ability.    Baseline  11/16: 16 seconds    Time  8    Period  Weeks    Status  New    Target Date  03/16/17      PT LONG TERM GOAL #5   Title  Patient will increase lower extremity functional scale to >60/80 to demonstrate improved functional mobility and increased tolerance with ADLs.     Baseline  11/16: 50/80    Time  8    Period  Weeks    Status  New    Target Date  03/16/17             Plan - 01/19/17 1102    Clinical Impression Statement  Patient is a pleasant 66 year old female who presents to physical therapy s/p R CVA with L hemiparesis. Patient has noted weakness of LLE in addition to L groin pain when ambulating.  Groin pain is indicative of minor groin pull with combined tight hip flexor musculature. Patient has decreased weight acceptance upon LLE and requires verbal and visual cueing for correction. Ambulation is limited by weakness and control of L foot. Pt. Demonstrates ability to ambulate w/o cane on stable surfaces. 5x STS= 24 seconds without UE assistance, 10MWT=. 7 m/s, TUG=16 seconds, LEFS=50/80, BERG=45/56. Patient will benefit from skilled physical therapy to address above mentioned deficits to return to  previous level of function for improved quality of life.     History and Personal Factors relevant to plan of care:  This patient presents with 3, personal factors/ comorbidities and  4  body elements including body structures and functions, activity limitations and or participation restrictions. Patient's condition is  evolving    Clinical Presentation  Evolving    Clinical Presentation due to:  progressively changing LOF.     Clinical Decision Making  Moderate    Rehab Potential  Good    Clinical Impairments Affecting Rehab Potential  (-) recent CVA, reliance upon caregiver for ride  (+) good family support, prepared to work hard for improvements (dedicated to therapy), high PLOF    PT Frequency  2x / week    PT Duration  8 weeks    PT Treatment/Interventions  ADLs/Self Care Home Management;Cryotherapy;Iontophoresis 4mg /ml Dexamethasone;Moist Heat;Traction;Ultrasound;Therapeutic exercise;Therapeutic activities;Functional mobility training;Stair training;Gait training;DME Instruction;Balance training;Neuromuscular re-education;Patient/family education;Orthotic Fit/Training;Manual techniques;Passive range of motion;Energy conservation;Splinting;Taping;Visual/perceptual remediation/compensation    PT Next Visit Plan  review HEP, improve hip extension strength, weight shift, stairs    PT Home Exercise Plan  see sheet    Consulted and Agree with Plan of Care  Patient       Patient will benefit from skilled therapeutic intervention in order to improve the following deficits and impairments:  Abnormal gait, Decreased activity tolerance, Decreased balance, Decreased endurance, Decreased coordination, Decreased mobility, Decreased range of motion, Difficulty walking, Decreased strength, Impaired flexibility, Impaired perceived functional ability, Postural dysfunction, Improper body mechanics, Pain  Visit Diagnosis: Abnormality of gait and mobility  Muscle weakness (generalized)  Unspecified lack of  coordination  G-Codes - 10-Feb-2017 1114    Functional Assessment Tool Used (Outpatient Only)  LEFS, 10MWT, TUG, BERG, 5xSTS, MMT, clinical judgement    Functional Limitation  Mobility: Walking and moving around    Mobility: Walking and Moving Around Current Status [phone])  At least 20 percent but less than 40 percent impaired, limited or restricted    Mobility: Walking and Moving Around Goal Status [phone])  At least 1 percent but less than 20 percent impaired, limited or restricted        Problem List Patient Active Problem List   Diagnosis Date Noted  . Tobacco abuse disorder 01/15/2017  . Blurry vision 01/15/2017  . Left hemiparesis ([place]) 01/06/2017  . HTN (hypertension) 01/06/2017  . CVA (cerebral vascular accident) ([place]) 01/06/2017   Janna Arch, PT, DPT   Janna Arch 02/10/2017, 11:15 AM  [place] MAIN [facility] SERVICES [address] [place], [place], [address] Phone: [phone]   Fax:  [phone]  Name: Angel Lane MRN: [id number] Date of Birth: [birth year]

## 2017-01-19 NOTE — Therapy (Signed)
[place] MAIN [facility] SERVICES [address] [place], [place], [address] Phone: [phone]   Fax:  [phone]  Occupational Therapy Evaluation  Patient Details  Name: Angel Lane MRN: [id number] Date of Birth: [birth year] Referring Provider: Dr. Brita Romp   Encounter Date: 01/19/2017  OT End of Session - 01/19/17 1145    Visit Number  1    Number of Visits  24    Date for OT Re-Evaluation  04/13/17    OT Start Time  1045    OT Stop Time  1145    OT Time Calculation (min)  60 min    Activity Tolerance  Patient tolerated treatment well    Behavior During Therapy  [facility] for tasks assessed/performed       Past Medical History:  Diagnosis Date  . Hypertension     Past Surgical History:  Procedure Laterality Date  . ABDOMINAL SURGERY    . CHOLECYSTECTOMY      There were no vitals filed for this visit.  Subjective Assessment - 01/19/17 1149    Subjective   Pt. reports she has never been through anything like this before. Pt. reports she will not be able to come on Wednesdays because her husband has appointments on Wednesdays.    Patient is accompained by:  Family member    Pertinent History  Pt. is a 66 y.o. female who was admitted suffered an Acute Ischemic CVA on Nov. 2nd, 1018. Pt. was admitted to [facility] with left sided weakness. Pt. returned home from [facility] with her husband assist, and now is ready for outpatient services.    Currently in Pain?  No/denies Pt. reports having left groin pain when she walks..    Pain Score  --    Multiple Pain Sites  No        [facility] OT Assessment - 01/19/17 1047      Assessment   Diagnosis  CVA    Referring Provider  Dr. Brita Romp    Onset Date  01/05/17      Balance Screen   Has the patient fallen in the past 6 months  No    Has the patient had a decrease in activity level because of a fear of falling?   No    Is the patient reluctant to leave their home because of a fear of falling?   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    Leisure  travelling, entertaining      ADL   Eating/Feeding  Independent    Grooming  Independent    Upper Body Bathing  Independent    Lower Body Bathing  Independent    Upper Body Dressing  Independent  Lower Body Dressing  Independent assist with tying shoes.    Banker -  Hygiene  Independent      IADL   Shopping  Needs to be accompanied on any shopping trip    Light Housekeeping  Needs help with all home maintenance tasks    Meal Prep  Able to complete simple cold meal and snack prep    Medication Management  Is responsible for taking medication in correct dosages at correct time    Financial Management  Manages financial matters independently (budgets, writes checks, pays rent, bills goes to bank), collects and keeps track of income      Written Expression   Dominant Hand  Right    Handwriting  -- No change in writing.      Vision - History   Baseline Vision  Wears glasses only for reading      Coordination   Right 9 Hole Peg Test  19    Left 9 Hole Peg Test  33      ROM / Strength   AROM / PROM / Strength  AROM      Strength   Overall Strength Comments  RUE: WNL, LUE: 4/5 shoulder flexion, extension, 4+/5 elbow flexion, extension, forearm supination, pronation, wrist extension.      Hand Function   Right  Hand Grip (lbs)  50    Right Hand Lateral Pinch  17 lbs    Right Hand 3 Point Pinch  16 lbs    Left Hand Grip (lbs)  20    Left Hand Lateral Pinch  13 lbs    Left 3 point pinch  10 lbs                           OT Long Term Goals - 01/19/17 1219      OT LONG TERM GOAL #1   Title  Pt. will be independent with HEP for LUE strength, and coordination skills.    Baseline  Eval: Requires assist    Time  12    Period  Weeks    Status  New    Target Date  04/13/17      OT LONG TERM GOAL #2   Title  Pt. will increase LUE strength by 2 mm grades to be able sweep independently.    Baseline  Eval: Pt. has difficulty    Time  12    Period  Weeks    Status  New    Target Date  04/13/17      OT LONG TERM GOAL #3   Title  Pt. will increase left grip strength by 5# to be able to hold, and use a vacuum.    Baseline  Eval: R: 50#, L: 20#    Time  12    Period  Weeks    Status  New    Target Date  04/13/17     OT LONG TERM GOAL #4   Title  Pt. will improve left lateral pinch strength by 2# to be able to cut meat efficiently.    Baseline  Eval: R: 17#, L: 13#    Time  12    Period  Weeks    Status  New    Target Date  04/13/17      OT LONG TERM GOAL #5   Title  Pt. will increase left 3pt. pinch strength by 2# to be able  to clip nails.    Baseline  Eval:  R: 16#, L: 10#    Time  12    Period  Weeks    Status  New    Target Date  04/13/17     Long Term Additional Goals   Additional Long Term Goals  Yes      OT LONG TERM GOAL #6   Title  Pt. will improve [facility] skills by 3 sec. of speed to be able to be able to ttishoes, and button efficiently.    Baseline  Eval: R: 19 sec., L: 33 sec.    Time  12    Period  Weeks    Status  New    Target Date  04/13/17            Plan - 2017-02-02 1209    Clinical Impression Statement  Pt. is a 66 y.o. female who sustained an Acute Ischemic CVA on Nov. 2nd, 2018. Pt. presents with LUE weakness, and incoordination skills  which limit her ability to engage in and complete ADL, and IADL care. including: buttoning, tying shoes, clipping nails, using utensils to cut meat, sweeping, vacuuming, holding items in her hand without dropping them, perfroming pet care, and  using both of her hands together.  Pt. MAM-20 score is 71. Pt. will beneift from skilled OT services to improve LUE functioning for improved engagement in ADLs, and IADLs.    Occupational Profile and client history currently impacting functional performance  Pt. is cureently retired. 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 Pt. has moved to [place] since retiring.    Occupational performance deficits (Please refer to evaluation for details):  ADL's;IADL's    Rehab Potential  Excellent    Current Impairments/barriers affecting progress:  Nondominant LUE weakness, incoordination limiting ADL, and IADL functioning.    OT Frequency  2x / week    OT Duration  12 weeks    OT Treatment/Interventions  Self-care/ADL training;Patient/family education;DME and/or AE instruction;Energy conservation;Neuromuscular education;Therapeutic exercise;Therapeutic exercises;Therapeutic activities    Clinical Decision Making  Several treatment options, min-mod task modification necessary    Recommended Other Services  PT    Consulted and Agree with Plan of Care  Patient       Patient will benefit from skilled therapeutic intervention in order to improve the following deficits and impairments:  Abnormal gait, Pain, Impaired UE functional use, Decreased strength, Decreased coordination  Visit Diagnosis: Muscle weakness (generalized)  Other lack of coordination  G-Codes - 02-Feb-2017 1236    Functional Assessment Tool Used (Outpatient only)  MAM-20, clinical judgement, 9-hole peg test    Functional Limitation  Self care    Self Care Current Status (W5809)  At least 20 percent but less than 40 percent impaired, limited or restricted    Self Care Goal Status (X8338)  At least 1  percent but less than 20 percent impaired, limited or restricted       Problem List Patient Active Problem List   Diagnosis Date Noted  . Tobacco abuse disorder 01/15/2017  . Blurry vision 01/15/2017  . Left hemiparesis ([place]) 01/06/2017  . HTN (hypertension) 01/06/2017  . CVA (cerebral vascular accident) ([place]) 01/06/2017    Harrel Carina, MS, OTR/L February 02, 2017, 12:42 PM  [place] MAIN [facility] SERVICES [address] [place], [place], [address] Phone: [phone]   Fax:  [phone]  Name: Angel Lane MRN: [id number] Date of Birth: 1951-01-26

## 2017-01-23 ENCOUNTER — Ambulatory Visit: Payer: Federal, State, Local not specified - PPO

## 2017-01-23 ENCOUNTER — Ambulatory Visit: Payer: Federal, State, Local not specified - PPO | Admitting: Occupational Therapy

## 2017-01-23 ENCOUNTER — Encounter: Payer: Self-pay | Admitting: Occupational Therapy

## 2017-01-23 DIAGNOSIS — R278 Other lack of coordination: Secondary | ICD-10-CM

## 2017-01-23 DIAGNOSIS — R269 Unspecified abnormalities of gait and mobility: Secondary | ICD-10-CM | POA: Diagnosis not present

## 2017-01-23 DIAGNOSIS — M6281 Muscle weakness (generalized): Secondary | ICD-10-CM

## 2017-01-23 NOTE — Therapy (Signed)
[place] MAIN [facility] SERVICES [address] [place], [place], [address] Phone: [phone]   Fax:  [phone]  Occupational Therapy Treatment  Patient Details  Name: KIMBERLY NIELAND MRN: [id number] Date of Birth: [birth year] Referring Provider: Dr. Brita Romp   Encounter Date: 01/23/2017  OT End of Session - 01/23/17 0915    Visit Number  2    Number of Visits  24    Date for OT Re-Evaluation  04/13/17    OT Start Time  0846    OT Stop Time  0930    OT Time Calculation (min)  44 min    Activity Tolerance  Patient tolerated treatment well    Behavior During Therapy  [facility] for tasks assessed/performed       Past Medical History:  Diagnosis Date  . Hypertension     Past Surgical History:  Procedure Laterality Date  . ABDOMINAL SURGERY    . CHOLECYSTECTOMY      There were no vitals filed for this visit.  Subjective Assessment - 01/23/17 0914    Subjective   Pt.      OT TREATMENT    Neuro muscular re-education:  Pt. Performed left hand [facility] skills training to improve speed and dexterity needed for ADL tasks and writing. Pt. demonstrated grasping 1 inch sticks,  inch cylindrical collars, and  inch flat washers on the [employer] pegboard. Pt. performed grasping each item with her 2nd digit and thumb, and storing them in the palm. Pt. presented with difficulty storing  inch objects at a time in the palmar aspect of the hand. Bilateral alternating hand movements.  Therapeutic Exercise:  Pt. Worked on the [employer] for 4 min. With constant monitoring of the BUEs. Pt. Worked on level 4 and changed to level 2 halfway through. Rest breaks were required. Pt. performed gross gripping with grip strengthener. Pt. worked on sustaining grip while grasping pegs and reaching at various heights. Gripper was placed in the resistive slot with the white resistive spring. Pt. Worked on pinch strengthening in the left hand for lateral, and 3pt. pinch using  yellow, red, green, and blue resistive clips. Pt. worked on placing the clips at various vertical and horizontal angles. Tactile and verbal cues were required for eliciting the desired movement.                               OT Long Term Goals - 01/19/17 1219      OT LONG TERM GOAL #1   Title  Pt. will be independent with HEP for LUE strength, and coordination skills.    Baseline  Eval: Requires assist    Time  12    Period  Weeks    Status  New    Target Date  04/13/17      OT LONG TERM GOAL #2   Title  Pt. will increase LUE strength by 2 mm grades to be able sweep independently.    Baseline  Eval: Pt. has difficulty    Time  12    Period  Weeks    Status  New    Target Date  04/13/17      OT LONG TERM GOAL #3   Title  Pt. will increase left grip strength by 5# to be able to hold, and use a vacuum.    Baseline  Eval: R: 50#, L: 20#    Time  12  Period  Weeks    Status  New    Target Date  04/13/17      OT LONG TERM GOAL #4   Title  Pt. will improve left lateral pinch strength by 2# to be able to cut meat efficiently.    Baseline  Eval: R: 17#, L: 13#    Time  12    Period  Weeks    Status  New    Target Date  04/13/17      OT LONG TERM GOAL #5   Title  Pt. will increase left 3pt. pinch strength by 2# to be able to clip nails.    Baseline  Eval:  R: 16#, L: 10#    Time  12    Period  Weeks    Status  New    Target Date  04/13/17      Long Term Additional Goals   Additional Long Term Goals  Yes      OT LONG TERM GOAL #6   Title  Pt. will improve [facility] skills by 3 sec. of speed to be able to be able to ttishoes, and button efficiently.    Baseline  Eval: R: 19 sec., L: 33 sec.    Time  12    Period  Weeks    Status  New    Target Date  04/13/17            Plan - 01/23/17 8502    Clinical Impression Statement  Pt. reports she had an episode in [place] this past weekend where her Left hand became weaker, and she had visual  changes. Pt. reports it resolved by th time the EMS left, so she didn't go to the hospital. Pt. reports she wouldn't let them transport her to the hospital. Pt. continues to present with LUE weakness, and incoordination. Pt. continues to work on improving LUE strength, and coordination, and translatory movements of the hands for improved engagement in ADLs, IADL,.    Occupational performance deficits (Please refer to evaluation for details):  IADL's    Rehab Potential  Excellent    Current Impairments/barriers affecting progress:  Nondominant LUE weakness, incoordination limiting ADL, and IADL functioning.    OT Frequency  2x / week    OT Duration  2 weeks    OT Treatment/Interventions  Self-care/ADL training;Patient/family education;DME and/or AE instruction;Energy conservation;Neuromuscular education;Therapeutic exercise;Therapeutic exercises;Therapeutic activities    Consulted and Agree with Plan of Care  Patient       Patient will benefit from skilled therapeutic intervention in order to improve the following deficits and impairments:  Abnormal gait, Pain, Impaired UE functional use, Decreased strength, Decreased coordination  Visit Diagnosis: Muscle weakness (generalized)    Problem List Patient Active Problem List   Diagnosis Date Noted  . Tobacco abuse disorder 01/15/2017  . Blurry vision 01/15/2017  . Left hemiparesis ([place]) 01/06/2017  . HTN (hypertension) 01/06/2017  . CVA (cerebral vascular accident) ([place]) 01/06/2017    Harrel Carina, MS, OTR/L 01/23/2017, 9:39 AM  [place] MAIN [facility] SERVICES [address] [place], [place], [address] Phone: [phone]   Fax:  [phone]  Name: TENESHIA HEDEEN MRN: [id number] Date of Birth: February 03, 1951

## 2017-01-23 NOTE — Therapy (Signed)
[place] MAIN [facility] SERVICES [address] [place], [place], [address] Phone: [phone]   Fax:  [phone]  Physical Therapy Treatment  Patient Details  Name: Angel Lane MRN: [id number] Date of Birth: 25-Jul-1950 Referring Provider: Lavon Paganini   Encounter Date: 01/23/2017  PT End of Session - 01/23/17 0929    Visit Number  2    Number of Visits  16    Date for PT Re-Evaluation  03/16/17    Authorization - Visit Number  2    Authorization - Number of Visits  10    PT Start Time  0930    PT Stop Time  1015    PT Time Calculation (min)  45 min    Equipment Utilized During Treatment  Gait belt    Activity Tolerance  Patient tolerated treatment well    Behavior During Therapy  [facility] for tasks assessed/performed       Past Medical History:  Diagnosis Date  . Hypertension     Past Surgical History:  Procedure Laterality Date  . ABDOMINAL SURGERY    . CHOLECYSTECTOMY      There were no vitals filed for this visit.  Subjective Assessment - 01/23/17 0935    Subjective  Patient reports non compliance with HEP.  Had another episode sunday with a bright light in her eye and Tingling in her L arm. No symptoms afterwards.     Pertinent History  Pt admitted to hospital for R CVA with L side weakness on 01/05/17 and discharged 01/08/17. History includes previous L CVA and HTN. Since then she has been moving around at home and trying to get herself stronger. Walked into session with cane, but doesn't always use it. Gets pain in L groin when walking 2/10 pain. Patient wants to return to previous level of function, such as walking and playing with her three dogs.     Limitations  Standing;Walking;House hold activities;Other (comment)    How long can you sit comfortably?  N/A    How long can you stand comfortably?  3-4 minutes, Left leg starts burning    How long can you walk comfortably?  3-4 minutes    Patient Stated Goals  return to  previous level of function.     Currently in Pain?  No/denies      TherEx Standing marches 10x  Standing hip flexion 10x BUE support each leg  Standing hip extension 10x BUE support  Standing hip abduction 10x Standing lateral weight shift 10x High knee marches in // bars  [employer] soldiers in // bars 4x  Heel raises with BUE support 20x Toe raises with BUe support 20x  step over and back over orange hurdle 10x each leg with SUE support  Seated rockerboard back 12x, forward 12, combined 12x    Airex ball pass with daughter Airex balloon pass with daughter Airex pad: eyes closed 60 seconds airex pad: marching 60 seconds Walking in hallway turning head horizontally to read cards on wall.  Knobby bosu ball taps based on PT guidance of foot and color, reduced UE support.                        PT Education - 01/23/17 0926    Education provided  Yes    Education Details  weight shifting, HEP compliance    Person(s) Educated  Patient    Methods  Explanation;Demonstration    Comprehension  Verbalized understanding;Returned demonstration  PT Short Term Goals - 01/19/17 1106      PT SHORT TERM GOAL #1   Title  Patient will be independent in home exercise program to improve strength/mobility for better functional independence with ADLs.    Baseline  HEP given    Time  2    Period  Weeks    Status  New    Target Date  02/02/17      PT SHORT TERM GOAL #2   Title  Patient (> 55 years old) will complete five times sit to stand test in < 20 seconds indicating an increased LE strength and improved balance.    Baseline  11/16: 24 seconds w/o hands    Time  2    Period  Weeks    Status  New    Target Date  02/02/17      PT SHORT TERM GOAL #3   Title   Patient will be independent in bending down towards floor and picking up small object (<5 pounds) and then stand back up without loss of balance as to improve ability to pick up and clean up room at home     Baseline  Pt. requires supervision to pick up objects from floor    Time  2    Period  Weeks    Status  New    Target Date  02/02/17        PT Long Term Goals - 01/19/17 1108      PT LONG TERM GOAL #1   Title   Patient (> 5 years old) will complete five times sit to stand test in < 15 seconds indicating an increased LE strength and improved balance.    Baseline  11/16: 24 seconds w/o hands    Time  8    Period  Weeks    Status  New    Target Date  03/16/17      PT LONG TERM GOAL #2   Title  Patient will increase Berg Balance score by > 6 points (51/56) to demonstrate decreased fall risk during functional activities.    Baseline  11/16: 45/56    Time  8    Period  Weeks    Status  New    Target Date  03/16/17      PT LONG TERM GOAL #3   Title  Patient will increase 10 meter walk test to >1.82m/s as to improve gait speed for better community ambulation and to reduce fall risk.    Baseline  11/16: .714    Time  8    Period  Weeks    Status  New    Target Date  03/16/17      PT LONG TERM GOAL #4   Title  Patient will reduce timed up and go to <11 seconds to reduce fall risk and demonstrate improved transfer/gait ability.    Baseline  11/16: 16 seconds    Time  8    Period  Weeks    Status  New    Target Date  03/16/17      PT LONG TERM GOAL #5   Title  Patient will increase lower extremity functional scale to >60/80 to demonstrate improved functional mobility and increased tolerance with ADLs.     Baseline  11/16: 50/80    Time  8    Period  Weeks    Status  New    Target Date  03/16/17  Plan - 01/23/17 1232    Clinical Impression Statement  Patient has difficulty lifting LLE due to limited control and decreased strength. Static balance was performed without LOB, and patient was only challenged with eyes closed upon Airex pad. Ambulating is challenging to patient with head turns due to the coordination required. Patient will continue to benefit from  skilled physical therapy to return to previous level of function for improved quality of life.     Rehab Potential  Good    Clinical Impairments Affecting Rehab Potential  (-) recent CVA, reliance upon caregiver for ride  (+) good family support, prepared to work hard for improvements (dedicated to therapy), high PLOF    PT Frequency  2x / week    PT Duration  8 weeks    PT Treatment/Interventions  ADLs/Self Care Home Management;Cryotherapy;Iontophoresis 4mg /ml Dexamethasone;Moist Heat;Traction;Ultrasound;Therapeutic exercise;Therapeutic activities;Functional mobility training;Stair training;Gait training;DME Instruction;Balance training;Neuromuscular re-education;Patient/family education;Orthotic Fit/Training;Manual techniques;Passive range of motion;Energy conservation;Splinting;Taping;Visual/perceptual remediation/compensation    PT Next Visit Plan  review HEP, improve hip extension strength, weight shift, stairs    PT Home Exercise Plan  see sheet    Consulted and Agree with Plan of Care  Patient       Patient will benefit from skilled therapeutic intervention in order to improve the following deficits and impairments:  Abnormal gait, Decreased activity tolerance, Decreased balance, Decreased endurance, Decreased coordination, Decreased mobility, Decreased range of motion, Difficulty walking, Decreased strength, Impaired flexibility, Impaired perceived functional ability, Postural dysfunction, Improper body mechanics, Pain  Visit Diagnosis: Muscle weakness (generalized)  Other lack of coordination  Abnormality of gait and mobility     Problem List Patient Active Problem List   Diagnosis Date Noted  . Tobacco abuse disorder 01/15/2017  . Blurry vision 01/15/2017  . Left hemiparesis ([place]) 01/06/2017  . HTN (hypertension) 01/06/2017  . CVA (cerebral vascular accident) ([place]) 01/06/2017   Janna Arch, PT, DPT   Janna Arch 01/23/2017, 12:33 PM  [place] MAIN [facility] SERVICES [address] [place], [place], [address] Phone: [phone]   Fax:  [phone]  Name: KOLETTE VEY MRN: [id number] Date of Birth: 10/26/1950

## 2017-01-30 ENCOUNTER — Ambulatory Visit: Payer: Federal, State, Local not specified - PPO

## 2017-01-30 ENCOUNTER — Encounter: Payer: Self-pay | Admitting: Occupational Therapy

## 2017-01-30 ENCOUNTER — Ambulatory Visit: Payer: Federal, State, Local not specified - PPO | Admitting: Occupational Therapy

## 2017-01-30 DIAGNOSIS — R278 Other lack of coordination: Secondary | ICD-10-CM

## 2017-01-30 DIAGNOSIS — M6281 Muscle weakness (generalized): Secondary | ICD-10-CM

## 2017-01-30 DIAGNOSIS — R269 Unspecified abnormalities of gait and mobility: Secondary | ICD-10-CM

## 2017-01-30 NOTE — Therapy (Deleted)
[place] MAIN [facility] SERVICES [address] [place], [place], [address] Phone: [phone]   Fax:  [phone]  Occupational Therapy Treatment  Patient Details  Name: Angel Lane MRN: [id number] Date of Birth: [birth year] Referring Provider: Dr. Brita Romp   Encounter Date: 01/30/2017  OT End of Session - 01/30/17 0849    Visit Number  3    Number of Visits  24    Date for OT Re-Evaluation  04/13/17    OT Start Time  0845    OT Stop Time  0930    OT Time Calculation (min)  45 min    Activity Tolerance  Patient tolerated treatment well    Behavior During Therapy  [facility] for tasks assessed/performed       Past Medical History:  Diagnosis Date  . Hypertension     Past Surgical History:  Procedure Laterality Date  . ABDOMINAL SURGERY    . CHOLECYSTECTOMY      There were no vitals filed for this visit.  Subjective Assessment - 01/30/17 0847    Subjective   Pt. reports she had a quiet Thanksgiving    Patient is accompained by:  Family member    Pertinent History  Pt. is a 66 y.o. female who was admitted suffered an Acute Ischemic CVA on Nov. 2nd, 1018. Pt. was admitted to [facility] with left sided weakness. Pt. returned home from [facility] with her husband assist, and now is ready for outpatient services.    Currently in Pain?  No/denies    Multiple Pain Sites  No                                OT Long Term Goals - 01/19/17 1219      OT LONG TERM GOAL #1   Title  Pt. will be independent with HEP for LUE strength, and coordination skills.    Baseline  Eval: Requires assist    Time  12    Period  Weeks    Status  New    Target Date  04/13/17      OT LONG TERM GOAL #2   Title  Pt. will increase LUE strength by 2 mm grades to be able sweep independently.    Baseline  Eval: Pt. has difficulty    Time  12    Period  Weeks    Status  New    Target Date  04/13/17      OT LONG TERM GOAL #3   Title  Pt. will  increase left grip strength by 5# to be able to hold, and use a vacuum.    Baseline  Eval: R: 50#, L: 20#    Time  12    Period  Weeks    Status  New    Target Date  04/13/17      OT LONG TERM GOAL #4   Title  Pt. will improve left lateral pinch strength by 2# to be able to cut meat efficiently.    Baseline  Eval: R: 17#, L: 13#    Time  12    Period  Weeks    Status  New    Target Date  04/13/17      OT LONG TERM GOAL #5   Title  Pt. will increase left 3pt. pinch strength by 2# to be able to clip nails.    Baseline  Eval:  R: 16#,  L: 10#    Time  12    Period  Weeks    Status  New    Target Date  04/13/17      Long Term Additional Goals   Additional Long Term Goals  Yes      OT LONG TERM GOAL #6   Title  Pt. will improve [facility] skills by 3 sec. of speed to be able to be able to ttishoes, and button efficiently.    Baseline  Eval: R: 19 sec., L: 33 sec.    Time  12    Period  Weeks    Status  New    Target Date  04/13/17            Plan - 01/30/17 0850    Clinical Impression Statement  Pt. reports that she is using her Left UE more at home. Pt. reports that she engaged her LUE in cooking for Thanksgiving, and sweeping. Pt. continues to work on improving LUE strength, and coordination for improved functional use during ADLs, and IADLs.     Occupational performance deficits (Please refer to evaluation for details):  ADL's;IADL's    Rehab Potential  Excellent    Current Impairments/barriers affecting progress:  Nondominant LUE weakness, incoordination limiting ADL, and IADL functioning.    OT Frequency  2x / week    OT Duration  2 weeks    OT Treatment/Interventions  Self-care/ADL training;Patient/family education;DME and/or AE instruction;Energy conservation;Neuromuscular education;Therapeutic exercise;Therapeutic exercises;Therapeutic activities    Clinical Decision Making  Several treatment options, min-mod task modification necessary    Consulted and Agree with Plan of  Care  Patient       Patient will benefit from skilled therapeutic intervention in order to improve the following deficits and impairments:  Abnormal gait, Pain, Impaired UE functional use, Decreased strength, Decreased coordination  Visit Diagnosis: Muscle weakness (generalized)  Other lack of coordination    Problem List Patient Active Problem List   Diagnosis Date Noted  . Tobacco abuse disorder 01/15/2017  . Blurry vision 01/15/2017  . Left hemiparesis ([place]) 01/06/2017  . HTN (hypertension) 01/06/2017  . CVA (cerebral vascular accident) ([place]) 01/06/2017    Angel Lane 01/30/2017, [place] MAIN [facility] SERVICES [address] [place], [place], [address] Phone: [phone]   Fax:  [phone]  Name: Angel Lane MRN: [id number] Date of Birth: 1951/01/10

## 2017-01-30 NOTE — Therapy (Signed)
[place] MAIN [facility] SERVICES [address] [place], [place], [address] Phone: [phone]   Fax:  [phone]  Occupational Therapy Treatment  Patient Details  Name: Angel Lane MRN: [id number] Date of Birth: 1950/04/27 Referring Provider: Dr. Brita Romp   Encounter Date: 01/30/2017  OT End of Session - 01/30/17 0849    Visit Number  3    Number of Visits  24    Date for OT Re-Evaluation  04/13/17    OT Start Time  0845    OT Stop Time  0930    OT Time Calculation (min)  45 min    Activity Tolerance  Patient tolerated treatment well    Behavior During Therapy  [facility] for tasks assessed/performed       Past Medical History:  Diagnosis Date  . Hypertension     Past Surgical History:  Procedure Laterality Date  . ABDOMINAL SURGERY    . CHOLECYSTECTOMY      There were no vitals filed for this visit.  Subjective Assessment - 01/30/17 0847    Subjective   Pt. reports she had a quiet Thanksgiving    Patient is accompained by:  Family member    Pertinent History  Pt. is a 66 y.o. female who was admitted suffered an Acute Ischemic CVA on Nov. 2nd, 1018. Pt. was admitted to [facility] with left sided weakness. Pt. returned home from [facility] with her husband assist, and now is ready for outpatient services.    Currently in Pain?  No/denies    Multiple Pain Sites  No       OT TREATMENT    Neuro muscular re-education:  Pt. performed [facility] tasks using the Grooved pegboard. Pt. worked on grasping the grooved pegs from a horizontal position, and moving the pegs to a vertical position in the hand to prepare for placing them in the grooved slot. Pt. Has difficulty with translatory movements of the hand. Pt. Worked on grasping coins from a tabletop surface, placing them into a resistive container, and pushing them through the slot while isolating his 2nd digit. A resistive mat was placed under coins to aide in manipulating the coins and prevent sliding  when picking them up. Pt. worked on tasks to sustain lateral pinch on resistive tweezers while grasping and moving 2" toothpick sticks from a horizontal flat position to a vertical position in order to place it in the holder. Pt. was able to sustain grasp while positioning and extending the wrist/hand in the necessary alignment needed to place the stick through the top of the holder, however required frequent cues for alignment, and position of the tweezers, and wrist when grasping.  Therapeutic Exercise:  Pt. worked on UE strengthening with red theraband for Bilateral shoulder flexion, extension.2# dumbbell ex. for bilateral elbow flexion and extension,forearm supination/pronation, wrist flexion/extension, and radial deviation for Left hand only. Pt. requires rest breaks and verbal cues for proper technique.                            OT Long Term Goals - 01/19/17 1219      OT LONG TERM GOAL #1   Title  Pt. will be independent with HEP for LUE strength, and coordination skills.    Baseline  Eval: Requires assist    Time  12    Period  Weeks    Status  New    Target Date  04/13/17  OT LONG TERM GOAL #2   Title  Pt. will increase LUE strength by 2 mm grades to be able sweep independently.    Baseline  Eval: Pt. has difficulty    Time  12    Period  Weeks    Status  New    Target Date  04/13/17      OT LONG TERM GOAL #3   Title  Pt. will increase left grip strength by 5# to be able to hold, and use a vacuum.    Baseline  Eval: R: 50#, L: 20#    Time  12    Period  Weeks    Status  New    Target Date  04/13/17      OT LONG TERM GOAL #4   Title  Pt. will improve left lateral pinch strength by 2# to be able to cut meat efficiently.    Baseline  Eval: R: 17#, L: 13#    Time  12    Period  Weeks    Status  New    Target Date  04/13/17      OT LONG TERM GOAL #5   Title  Pt. will increase left 3pt. pinch strength by 2# to be able to clip nails.     Baseline  Eval:  R: 16#, L: 10#    Time  12    Period  Weeks    Status  New    Target Date  04/13/17      Long Term Additional Goals   Additional Long Term Goals  Yes      OT LONG TERM GOAL #6   Title  Pt. will improve [facility] skills by 3 sec. of speed to be able to be able to ttishoes, and button efficiently.    Baseline  Eval: R: 19 sec., L: 33 sec.    Time  12    Period  Weeks    Status  New    Target Date  04/13/17            Plan - 01/30/17 0850    Clinical Impression Statement Pt. reports that she is using her Left UE more at home. Pt. reports that she engaged her LUE in cooking for Thanksgiving, sweeping, and vacuuming. Pt. continues to have difficulty with translatory movements of the hand. Pt. continues to work on improving LUE strength, and coordination for improved functional use during ADLs, and IADLs.     Occupational performance deficits (Please refer to evaluation for details):  ADL's;IADL's    Rehab Potential  Excellent    Current Impairments/barriers affecting progress:  Nondominant LUE weakness, incoordination limiting ADL, and IADL functioning.    OT Frequency  2x / week    OT Duration  2 weeks    OT Treatment/Interventions  Self-care/ADL training;Patient/family education;DME and/or AE instruction;Energy conservation;Neuromuscular education;Therapeutic exercise;Therapeutic exercises;Therapeutic activities    Clinical Decision Making  Several treatment options, min-mod task modification necessary    Consulted and Agree with Plan of Care  Patient       Patient will benefit from skilled therapeutic intervention in order to improve the following deficits and impairments:  Abnormal gait, Pain, Impaired UE functional use, Decreased strength, Decreased coordination  Visit Diagnosis: Muscle weakness (generalized)  Other lack of coordination    Problem List Patient Active Problem List   Diagnosis Date Noted  . Tobacco abuse disorder 01/15/2017  . Blurry vision  01/15/2017  . Left hemiparesis ([place]) 01/06/2017  . HTN (hypertension) 01/06/2017  .  CVA (cerebral vascular accident) ([place]) 01/06/2017    Harrel Carina, MS, OTR/L 01/30/2017, 8:59 AM  [place] MAIN [facility] SERVICES [address] [place], [place], [address] Phone: [phone]   Fax:  [phone]  Name: WYLIE RUSSON MRN: [id number] Date of Birth: 31-Mar-1950

## 2017-01-30 NOTE — Therapy (Signed)
[place] MAIN [facility] SERVICES [address] [place], [place], [address] Phone: [phone]   Fax:  [phone]  Physical Therapy Treatment  Patient Details  Name: Angel Lane MRN: [id number] Date of Birth: 08-08-1950 Referring Provider: Lavon Paganini   Encounter Date: 01/30/2017  PT End of Session - 01/30/17 0830    Visit Number  3    Number of Visits  16    Date for PT Re-Evaluation  03/16/17    Authorization - Visit Number  3    Authorization - Number of Visits  10    PT Start Time  0805    PT Stop Time  0845    PT Time Calculation (min)  40 min    Equipment Utilized During Treatment  Gait belt    Activity Tolerance  Patient tolerated treatment well    Behavior During Therapy  [facility] for tasks assessed/performed       Past Medical History:  Diagnosis Date  . Hypertension     Past Surgical History:  Procedure Laterality Date  . ABDOMINAL SURGERY    . CHOLECYSTECTOMY      There were no vitals filed for this visit.  Subjective Assessment - 01/30/17 0809    Subjective  Patient reports non compliance with HEP. Been busy cleaning and cooking for the holidays.     Pertinent History  Pt admitted to hospital for R CVA with L side weakness on 01/05/17 and discharged 01/08/17. History includes previous L CVA and HTN. Since then she has been moving around at home and trying to get herself stronger. Walked into session with cane, but doesn't always use it. Gets pain in L groin when walking 2/10 pain. Patient wants to return to previous level of function, such as walking and playing with her three dogs.     Limitations  Standing;Walking;House hold activities;Other (comment)    How long can you sit comfortably?  N/A    How long can you stand comfortably?  3-4 minutes, Left leg starts burning    How long can you walk comfortably?  3-4 minutes    Patient Stated Goals  return to previous level of function.     Currently in Pain?  No/denies           Weight shift over LLE with green step under RLE for increased weight acceptance of LLE.  Weight shift over LLE with soccer ball under RLE  For increased weight acceptance over LLE.  Sit to stand without UE assistance High knee marches in // bars  Lunges in bosu ball 15x each leg  Side lunges onto bosu ball 15x each leg, cues for keeping toes forward  Ambulating in hallway with poles to increase arm swing 2x200  Ambulating in hallway after pole intervention with cues for arm swing and lifting of LLE.  10x STS. Cues for not using UEs   Bosu ball balance: 3 minutes Walking in hallway turning head horizontally to read cards on wall.  Speed ladder: one foot in each box 6x with cues for UE swing,  side step two feet 6x with no LOB, two out two in 6x one stumble, cueing for sequencing.                      PT Education - 01/30/17 0830    Education provided  Yes    Education Details  weight shifting, HEP compliance, silver sneakers    Person(s) Educated  Patient  Methods  Explanation;Demonstration;Verbal cues    Comprehension  Verbalized understanding;Returned demonstration       PT Short Term Goals - 01/19/17 1106      PT SHORT TERM GOAL #1   Title  Patient will be independent in home exercise program to improve strength/mobility for better functional independence with ADLs.    Baseline  HEP given    Time  2    Period  Weeks    Status  New    Target Date  02/02/17      PT SHORT TERM GOAL #2   Title  Patient (> 53 years old) will complete five times sit to stand test in < 20 seconds indicating an increased LE strength and improved balance.    Baseline  11/16: 24 seconds w/o hands    Time  2    Period  Weeks    Status  New    Target Date  02/02/17      PT SHORT TERM GOAL #3   Title   Patient will be independent in bending down towards floor and picking up small object (<5 pounds) and then stand back up without loss of balance as to improve ability  to pick up and clean up room at home    Baseline  Pt. requires supervision to pick up objects from floor    Time  2    Period  Weeks    Status  New    Target Date  02/02/17        PT Long Term Goals - 01/19/17 1108      PT LONG TERM GOAL #1   Title   Patient (> 95 years old) will complete five times sit to stand test in < 15 seconds indicating an increased LE strength and improved balance.    Baseline  11/16: 24 seconds w/o hands    Time  8    Period  Weeks    Status  New    Target Date  03/16/17      PT LONG TERM GOAL #2   Title  Patient will increase Berg Balance score by > 6 points (51/56) to demonstrate decreased fall risk during functional activities.    Baseline  11/16: 45/56    Time  8    Period  Weeks    Status  New    Target Date  03/16/17      PT LONG TERM GOAL #3   Title  Patient will increase 10 meter walk test to >1.61m/s as to improve gait speed for better community ambulation and to reduce fall risk.    Baseline  11/16: .714    Time  8    Period  Weeks    Status  New    Target Date  03/16/17      PT LONG TERM GOAL #4   Title  Patient will reduce timed up and go to <11 seconds to reduce fall risk and demonstrate improved transfer/gait ability.    Baseline  11/16: 16 seconds    Time  8    Period  Weeks    Status  New    Target Date  03/16/17      PT LONG TERM GOAL #5   Title  Patient will increase lower extremity functional scale to >60/80 to demonstrate improved functional mobility and increased tolerance with ADLs.     Baseline  11/16: 50/80    Time  8    Period  Weeks  Status  New    Target Date  03/16/17            Plan - 01/30/17 0844    Clinical Impression Statement  Patient requires cueing for swinging arms with ambulation and LE movements to improve natural gait pattern. Static balance is challenged by unstable surface. Patient will continue to benefit from skilled physical therapy to return to previous level of function for improved  quality of life.     Rehab Potential  Good    Clinical Impairments Affecting Rehab Potential  (-) recent CVA, reliance upon caregiver for ride  (+) good family support, prepared to work hard for improvements (dedicated to therapy), high PLOF    PT Frequency  2x / week    PT Duration  8 weeks    PT Treatment/Interventions  ADLs/Self Care Home Management;Cryotherapy;Iontophoresis 4mg /ml Dexamethasone;Moist Heat;Traction;Ultrasound;Therapeutic exercise;Therapeutic activities;Functional mobility training;Stair training;Gait training;DME Instruction;Balance training;Neuromuscular re-education;Patient/family education;Orthotic Fit/Training;Manual techniques;Passive range of motion;Energy conservation;Splinting;Taping;Visual/perceptual remediation/compensation    PT Next Visit Plan  review HEP, improve hip extension strength, weight shift, stairs    PT Home Exercise Plan  see sheet    Consulted and Agree with Plan of Care  Patient       Patient will benefit from skilled therapeutic intervention in order to improve the following deficits and impairments:  Abnormal gait, Decreased activity tolerance, Decreased balance, Decreased endurance, Decreased coordination, Decreased mobility, Decreased range of motion, Difficulty walking, Decreased strength, Impaired flexibility, Impaired perceived functional ability, Postural dysfunction, Improper body mechanics, Pain  Visit Diagnosis: Muscle weakness (generalized)  Other lack of coordination  Abnormality of gait and mobility     Problem List Patient Active Problem List   Diagnosis Date Noted  . Tobacco abuse disorder 01/15/2017  . Blurry vision 01/15/2017  . Left hemiparesis ([place]) 01/06/2017  . HTN (hypertension) 01/06/2017  . CVA (cerebral vascular accident) ([place]) 01/06/2017   Janna Arch, PT, DPT   Janna Arch 01/30/2017, 8:45 AM  [place] MAIN [facility] SERVICES [address] [place], [place],  [address] Phone: [phone]   Fax:  [phone]  Name: Angel Lane MRN: [id number] Date of Birth: [birth year]

## 2017-02-02 ENCOUNTER — Ambulatory Visit: Payer: Federal, State, Local not specified - PPO

## 2017-02-02 ENCOUNTER — Ambulatory Visit: Payer: Federal, State, Local not specified - PPO | Admitting: Occupational Therapy

## 2017-02-05 ENCOUNTER — Ambulatory Visit: Payer: Federal, State, Local not specified - PPO | Admitting: Occupational Therapy

## 2017-02-05 ENCOUNTER — Ambulatory Visit: Payer: Federal, State, Local not specified - PPO

## 2017-02-07 ENCOUNTER — Other Ambulatory Visit: Payer: Self-pay | Admitting: Family Medicine

## 2017-02-07 MED ORDER — METOPROLOL TARTRATE 25 MG PO TABS: 25.0000 mg | ORAL_TABLET | Freq: Two times a day (BID) | ORAL | 3 refills | Status: DC

## 2017-02-07 MED ORDER — ASPIRIN 325 MG PO TBEC: 325.0000 mg | DELAYED_RELEASE_TABLET | Freq: Every day | ORAL | 3 refills | Status: DC

## 2017-02-07 MED ORDER — ATORVASTATIN CALCIUM 40 MG PO TABS: 40.0000 mg | ORAL_TABLET | Freq: Every day | ORAL | 3 refills | Status: DC

## 2017-02-07 NOTE — Telephone Encounter (Signed)
Pt contacted office for refill request on the following medications:  atorvastatin (LIPITOR) 40 MG tablet  aspirin EC 325 MG EC tablet  metoprolol tartrate (LOPRESSOR) 25 MG tablet  [facility].  IY#[phone]/PK

## 2017-02-07 NOTE — Telephone Encounter (Signed)
LOV 01/15/2017. Has FU 02/13/2017.

## 2017-02-09 ENCOUNTER — Encounter: Payer: Medicare Other | Admitting: Occupational Therapy

## 2017-02-09 ENCOUNTER — Ambulatory Visit: Payer: Medicare Other

## 2017-02-12 ENCOUNTER — Ambulatory Visit: Payer: Medicare Other

## 2017-02-12 ENCOUNTER — Encounter: Payer: Medicare Other | Admitting: Occupational Therapy

## 2017-02-13 ENCOUNTER — Encounter: Payer: Self-pay | Admitting: Occupational Therapy

## 2017-02-13 ENCOUNTER — Ambulatory Visit: Payer: Medicare Other | Admitting: Family Medicine

## 2017-02-13 ENCOUNTER — Ambulatory Visit: Payer: Federal, State, Local not specified - PPO | Admitting: Physical Therapy

## 2017-02-13 ENCOUNTER — Encounter: Payer: Self-pay | Admitting: Physical Therapy

## 2017-02-13 ENCOUNTER — Ambulatory Visit: Payer: Federal, State, Local not specified - PPO | Attending: Family Medicine | Admitting: Occupational Therapy

## 2017-02-13 DIAGNOSIS — R269 Unspecified abnormalities of gait and mobility: Secondary | ICD-10-CM | POA: Diagnosis present

## 2017-02-13 DIAGNOSIS — M6281 Muscle weakness (generalized): Secondary | ICD-10-CM | POA: Diagnosis present

## 2017-02-13 DIAGNOSIS — R278 Other lack of coordination: Secondary | ICD-10-CM

## 2017-02-13 DIAGNOSIS — I6381 Other cerebral infarction due to occlusion or stenosis of small artery: Secondary | ICD-10-CM | POA: Insufficient documentation

## 2017-02-13 DIAGNOSIS — R279 Unspecified lack of coordination: Secondary | ICD-10-CM

## 2017-02-13 NOTE — Therapy (Signed)
[place] MAIN [facility] SERVICES [address] [place], [place], [address] Phone: [phone]   Fax:  [phone]  Occupational Therapy Treatment  Patient Details  Name: Angel Lane MRN: [id number] Date of Birth: 12-03-1950 Referring Provider (Historical): Dr. Brita Romp   Encounter Date: 02/13/2017  OT End of Session - 02/13/17 1455    Visit Number  4    Number of Visits  24    Date for OT Re-Evaluation  04/13/17    OT Start Time  1435    OT Stop Time  1500    OT Time Calculation (min)  25 min    Activity Tolerance  Patient tolerated treatment well    Behavior During Therapy  [facility] for tasks assessed/performed       Past Medical History:  Diagnosis Date  . Hypertension     Past Surgical History:  Procedure Laterality Date  . ABDOMINAL SURGERY    . CHOLECYSTECTOMY      There were no vitals filed for this visit.  Subjective Assessment - 02/13/17 1452    Subjective   Pt. reports she was started on an antidepressant    Patient is accompained by:  Family member    Pertinent History  Pt. is a 66 y.o. female who was admitted suffered an Acute Ischemic CVA on Nov. 2nd, 1018. Pt. was admitted to [facility] with left sided weakness. Pt. returned home from [facility] with her husband assist, and now is ready for outpatient services.    Currently in Pain?  No/denies      OT TREATMENT    Neuro muscular re-education:  Pt. performed [facility] tasks using the Grooved pegboard. Pt. worked on grasping the grooved pegs from a horizontal position, and moving the pegs to a vertical position in the hand to prepare for placing them in the grooved slot.   Therapeutic Exercise:  Pt. Worked on the [employer] for 8 min. With constant monitoring of the BUEs. Pt. Worked on changing, and alternating forward reverse position every 2 min. Rest breaks were required.                           OT Education - 02/13/17 1453    Education provided  Yes     Person(s) Educated  Patient    Methods  Explanation;Demonstration;Verbal cues    Comprehension  Verbalized understanding;Returned demonstration          OT Long Term Goals - 01/19/17 1219      OT LONG TERM GOAL #1   Title  Pt. will be independent with HEP for LUE strength, and coordination skills.    Baseline  Eval: Requires assist    Time  12    Period  Weeks    Status  New    Target Date  04/13/17      OT LONG TERM GOAL #2   Title  Pt. will increase LUE strength by 2 mm grades to be able sweep independently.    Baseline  Eval: Pt. has difficulty    Time  12    Period  Weeks    Status  New    Target Date  04/13/17      OT LONG TERM GOAL #3   Title  Pt. will increase left grip strength by 5# to be able to hold, and use a vacuum.    Baseline  Eval: R: 50#, L: 20#    Time  12  Period  Weeks    Status  New    Target Date  04/13/17      OT LONG TERM GOAL #4   Title  Pt. will improve left lateral pinch strength by 2# to be able to cut meat efficiently.    Baseline  Eval: R: 17#, L: 13#    Time  12    Period  Weeks    Status  New    Target Date  04/13/17      OT LONG TERM GOAL #5   Title  Pt. will increase left 3pt. pinch strength by 2# to be able to clip nails.    Baseline  Eval:  R: 16#, L: 10#    Time  12    Period  Weeks    Status  New    Target Date  04/13/17      Long Term Additional Goals   Additional Long Term Goals  Yes      OT LONG TERM GOAL #6   Title  Pt. will improve [facility] skills by 3 sec. of speed to be able to be able to ttishoes, and button efficiently.    Baseline  Eval: R: 19 sec., L: 33 sec.    Time  12    Period  Weeks    Status  New    Target Date  04/13/17            Plan - 02/13/17 1502    Clinical Impression Statement  Pt. was very late for the treatment session. Pt. reports she is now doing more at home. Pt. reports she is engaging her Left hand into household tasks such as vacuuming, sweeping, and laundry. Pt. reports she is  having difficulty holding a steady grip onto items. Pt. continues to work on improving UE strength, and coordination skills for improved ADL, and IADL tasks.     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    Occupational performance deficits (Please refer to evaluation for details):  ADL's;IADL's    Rehab Potential  Excellent    Current Impairments/barriers affecting progress:  Nondominant LUE weakness, incoordination limiting ADL, and IADL functioning.    OT Frequency  2x / week    OT Duration  2 weeks    OT Treatment/Interventions  Self-care/ADL training;DME and/or AE instruction;Patient/family education;Therapeutic activities;Therapeutic exercise;Neuromuscular education    Clinical Decision Making  Several treatment options, min-mod task modification necessary    Consulted and Agree with Plan of Care  Patient       Patient will benefit from skilled therapeutic intervention in order to improve the following deficits and impairments:  Abnormal gait, Pain, Impaired UE functional use, Decreased strength, Decreased coordination  Visit Diagnosis: Muscle weakness (generalized)  Other lack of coordination    Problem List Patient Active Problem List   Diagnosis Date Noted  . Tobacco abuse disorder 01/15/2017  . Blurry vision 01/15/2017  . Left hemiparesis ([place]) 01/06/2017  . HTN (hypertension) 01/06/2017  . CVA (cerebral vascular accident) ([place]) 01/06/2017    Harrel Carina, MS, OTR/L 02/13/2017, 3:14 PM  [place] MAIN [facility] SERVICES [address] [place], [place], [address] Phone: [phone]   Fax:  [phone]  Name: Angel Lane MRN: [id number] Date of Birth: 10-May-1950

## 2017-02-13 NOTE — Therapy (Signed)
[place] MAIN [facility] SERVICES [address] [place], [place], [address] Phone: [phone]   Fax:  [phone]  Physical Therapy Treatment  Patient Details  Name: Angel Lane MRN: [id number] Date of Birth: Feb 13, 1951 Referring Provider: Lavon Paganini   Encounter Date: 02/13/2017  PT End of Session - 02/13/17 1507    Visit Number  4    Number of Visits  16    Date for PT Re-Evaluation  03/16/17    Authorization - Visit Number  4    Authorization - Number of Visits  10    PT Start Time  0300    PT Stop Time  0345    PT Time Calculation (min)  45 min    Equipment Utilized During Treatment  Gait belt    Activity Tolerance  Patient tolerated treatment well    Behavior During Therapy  [facility] for tasks assessed/performed       Past Medical History:  Diagnosis Date  . Hypertension     Past Surgical History:  Procedure Laterality Date  . ABDOMINAL SURGERY    . CHOLECYSTECTOMY      There were no vitals filed for this visit.  Subjective Assessment - 02/13/17 1506    Subjective  Patient reports only doing her HEP a little. Been busy cleaning and cooking for the holidays.     Pertinent History  Pt admitted to hospital for R CVA with L side weakness on 01/05/17 and discharged 01/08/17. History includes previous L CVA and HTN. Since then she has been moving around at home and trying to get herself stronger. Walked into session with cane, but doesn't always use it. Gets pain in L groin when walking 2/10 pain. Patient wants to return to previous level of function, such as walking and playing with her three dogs.     Limitations  Standing;Walking;House hold activities;Other (comment)    How long can you sit comfortably?  N/A    How long can you stand comfortably?  3-4 minutes, Left leg starts burning    How long can you walk comfortably?  3-4 minutes    Patient Stated Goals  return to previous level of function.     Currently in Pain?   No/denies    Pain Score  0-No pain    Multiple Pain Sites  No       NEUROMUSCULAR RE-EDUCATION Octane fitness x 5 mins warm up  BOSU ball squats 5 seconds each;cues for posture correction Airex NBOS eyes open horizontal x 30 seconds;cues for posture correction Toe tapping BOSU ball from purple foam without UE assist, cues for technique Lunging to BOSU ball from purple foam without UE assist, cues for technique Tandem gait in // bars x 4 laps , posture correction cues  cues for posture and motor control Matrix fwd/ bwd/ side stepping x 10 17. 5 lbs  Cues for proper technique of exercises CGA and  mod verbal cues used throughout with increased in postural sway and LOB most seen with narrow base of support and while on uneven surfaces. Continues to have balance deficits typical with diagnosis. Patient performs intermediate level exercises without pain behaviors and needs verbal cuing for postural alignment and head positioning                  PT Education - 02/13/17 1507    Education provided  Yes    Education Details  importance of HEP    Person(s) Educated  Patient  Methods  Explanation    Comprehension  Verbalized understanding       PT Short Term Goals - 01/19/17 1106      PT SHORT TERM GOAL #1   Title  Patient will be independent in home exercise program to improve strength/mobility for better functional independence with ADLs.    Baseline  HEP given    Time  2    Period  Weeks    Status  New    Target Date  02/02/17      PT SHORT TERM GOAL #2   Title  Patient (> 61 years old) will complete five times sit to stand test in < 20 seconds indicating an increased LE strength and improved balance.    Baseline  11/16: 24 seconds w/o hands    Time  2    Period  Weeks    Status  New    Target Date  02/02/17      PT SHORT TERM GOAL #3   Title   Patient will be independent in bending down towards floor and picking up small object (<5 pounds) and then stand back  up without loss of balance as to improve ability to pick up and clean up room at home    Baseline  Pt. requires supervision to pick up objects from floor    Time  2    Period  Weeks    Status  New    Target Date  02/02/17        PT Long Term Goals - 01/19/17 1108      PT LONG TERM GOAL #1   Title   Patient (> 35 years old) will complete five times sit to stand test in < 15 seconds indicating an increased LE strength and improved balance.    Baseline  11/16: 24 seconds w/o hands    Time  8    Period  Weeks    Status  New    Target Date  03/16/17      PT LONG TERM GOAL #2   Title  Patient will increase Berg Balance score by > 6 points (51/56) to demonstrate decreased fall risk during functional activities.    Baseline  11/16: 45/56    Time  8    Period  Weeks    Status  New    Target Date  03/16/17      PT LONG TERM GOAL #3   Title  Patient will increase 10 meter walk test to >1.89m/s as to improve gait speed for better community ambulation and to reduce fall risk.    Baseline  11/16: .714    Time  8    Period  Weeks    Status  New    Target Date  03/16/17      PT LONG TERM GOAL #4   Title  Patient will reduce timed up and go to <11 seconds to reduce fall risk and demonstrate improved transfer/gait ability.    Baseline  11/16: 16 seconds    Time  8    Period  Weeks    Status  New    Target Date  03/16/17      PT LONG TERM GOAL #5   Title  Patient will increase lower extremity functional scale to >60/80 to demonstrate improved functional mobility and increased tolerance with ADLs.     Baseline  11/16: 50/80    Time  8    Period  Weeks    Status  New    Target Date  03/16/17            Plan - 02/13/17 1508    Clinical Impression Statement  Patient required min verbal cues to perform matrix fwd/ bwd/ side stepping with weights for posture and control and required verbal and tactile cues during all dynamic standing balance activities. Patient demonstrated  decreased gait speed with ambulation with spc.  Patient will continue to benefit from skilled therapy in order to improve strength, dynamic standing balance and increase gait speed to reduce risk for falls    Rehab Potential  Good    Clinical Impairments Affecting Rehab Potential  (-) recent CVA, reliance upon caregiver for ride  (+) good family support, prepared to work hard for improvements (dedicated to therapy), high PLOF    PT Frequency  2x / week    PT Duration  8 weeks    PT Treatment/Interventions  ADLs/Self Care Home Management;Cryotherapy;Iontophoresis 4mg /ml Dexamethasone;Moist Heat;Traction;Ultrasound;Therapeutic exercise;Therapeutic activities;Functional mobility training;Stair training;Gait training;DME Instruction;Balance training;Neuromuscular re-education;Patient/family education;Orthotic Fit/Training;Manual techniques;Passive range of motion;Energy conservation;Splinting;Taping;Visual/perceptual remediation/compensation    PT Next Visit Plan  review HEP, improve hip extension strength, weight shift, stairs    PT Home Exercise Plan  see sheet    Consulted and Agree with Plan of Care  Patient       Patient will benefit from skilled therapeutic intervention in order to improve the following deficits and impairments:  Abnormal gait, Decreased activity tolerance, Decreased balance, Decreased endurance, Decreased coordination, Decreased mobility, Decreased range of motion, Difficulty walking, Decreased strength, Impaired flexibility, Impaired perceived functional ability, Postural dysfunction, Improper body mechanics, Pain  Visit Diagnosis: Muscle weakness (generalized)  Other lack of coordination  Abnormality of gait and mobility  Unspecified lack of coordination     Problem List Patient Active Problem List   Diagnosis Date Noted  . Tobacco abuse disorder 01/15/2017  . Blurry vision 01/15/2017  . Left hemiparesis ([place]) 01/06/2017  . HTN (hypertension) 01/06/2017  . CVA  (cerebral vascular accident) ([place]) 01/06/2017    Alanson Puls, PT DPT 02/13/2017, 3:10 PM  [place] MAIN [facility] SERVICES [address] [place], [place], [address] Phone: [phone]   Fax:  [phone]  Name: Angel Lane MRN: [id number] Date of Birth: [birth year]

## 2017-02-15 ENCOUNTER — Ambulatory Visit: Payer: Federal, State, Local not specified - PPO | Admitting: Occupational Therapy

## 2017-02-15 DIAGNOSIS — M6281 Muscle weakness (generalized): Secondary | ICD-10-CM | POA: Diagnosis not present

## 2017-02-15 DIAGNOSIS — R278 Other lack of coordination: Secondary | ICD-10-CM

## 2017-02-16 ENCOUNTER — Encounter: Payer: Self-pay | Admitting: Occupational Therapy

## 2017-02-16 ENCOUNTER — Ambulatory Visit: Payer: Medicare Other

## 2017-02-16 ENCOUNTER — Encounter: Payer: Medicare Other | Admitting: Occupational Therapy

## 2017-02-16 NOTE — Therapy (Signed)
[place] MAIN [facility] SERVICES [address] [place], [place], [address] Phone: [phone]   Fax:  [phone]  Occupational Therapy Treatment  Patient Details  Name: Angel Lane MRN: [id number] Date of Birth: 06/07/1950 Referring Provider (Historical): Dr. Brita Romp   Encounter Date: 02/15/2017  OT End of Session - 02/18/17 0958    Visit Number  5    Number of Visits  24    Date for OT Re-Evaluation  04/13/17    OT Start Time  1400    OT Stop Time  1446    OT Time Calculation (min)  46 min    Activity Tolerance  Patient tolerated treatment well    Behavior During Therapy  [facility] for tasks assessed/performed       Past Medical History:  Diagnosis Date  . Hypertension     Past Surgical History:  Procedure Laterality Date  . ABDOMINAL SURGERY    . CHOLECYSTECTOMY      There were no vitals filed for this visit.  Subjective Assessment - 02/18/17 0958    Subjective   Patient reports she is doing well, starting to be able to tie her shoes, lifting pots and pans.  Has difficulty with managing the zipper on her boots.      Pertinent History  Pt. is a 66 y.o. female who was admitted suffered an Acute Ischemic CVA on Nov. 2nd, 1018. Pt. was admitted to [facility] with left sided weakness. Pt. returned home from [facility] with her husband assist, and now is ready for outpatient services.    Patient Stated Goals  To be as independent as she was before.     Currently in Pain?  No/denies    Pain Score  0-No pain                   OT Treatments/Exercises (OP) - 02/18/17 1000      ADLs   ADL Comments  Patient able to demo tying shoes, can lift a pot, more difficulty when it is full.  Patient able to operate a zipper but has difficulty with her boots to be able to pinch and pull to get the zipper up.  When holding objects she reports her left side is heavy.  Patient seen for knotting and unknotting of laces for multiple repetitions, cues  for technique at times.       Fine Motor Coordination   Other Fine Motor Exercises  Patient seen for manipulation of judyo board 100 pegboard with flipping and turning of objects to place into grid.  Cues for technique and speed.       Neurological Re-education Exercises   Other Exercises 1  Patient seen for resistive pinch skills with left hand for lateral, 3 point and 2 point pinch with cues for form, difficulty with black pinch especially when reaching to place onto elevated surface.  Finger strengthening with push pins placing into moderate resistive bulletin board.               OT Education - 02/18/17 [id number]    Education provided  Yes    Education Details  HEP, [facility]    Person(s) Educated  Patient    Methods  Explanation;Demonstration;Verbal cues    Comprehension  Verbal cues required;Returned demonstration;Verbalized understanding          OT Long Term Goals - 02/18/17 0959      OT LONG TERM GOAL #1   Title  Pt. will be independent with  HEP for LUE strength, and coordination skills.    Baseline  Eval: Requires assist    Time  12    Period  Weeks    Status  Partially Met      OT LONG TERM GOAL #2   Title  Pt. will increase LUE strength by 2 mm grades to be able sweep independently.    Baseline  Eval: Pt. has difficulty    Time  12    Period  Weeks    Status  Partially Met      OT LONG TERM GOAL #3   Title  Pt. will increase left grip strength by 5# to be able to hold, and use a vacuum.    Baseline  Eval: R: 50#, L: 20#    Time  12    Period  Weeks    Status  Achieved      OT LONG TERM GOAL #4   Title  Pt. will improve left lateral pinch strength by 2# to be able to cut meat efficiently.    Baseline  Eval: R: 17#, L: 13#    Time  12    Period  Weeks    Status  Partially Met      OT LONG TERM GOAL #5   Title  Pt. will increase left 3pt. pinch strength by 2# to be able to clip nails.    Baseline  Eval:  R: 16#, L: 10#    Time  12    Period  Weeks     Status  Partially Met      OT LONG TERM GOAL #6   Title  Pt. will improve [facility] skills by 3 sec. of speed to be able to be able to ttishoes, and button efficiently.    Baseline  Eval: R: 19 sec., L: 33 sec.    Time  12    Period  Weeks    Status  Achieved            Plan - 02/18/17 0959    Clinical Impression Statement  Patient has made excellent progress, reassessed her grip strength this date right 57#, left 46#, 9 hole peg test left 24 sec.  She has partially met her goals.  She still has difficulty with lifting heavier objects, left side still feels heavy to her and she has difficulty with putting on boots and managing zipper.    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    Occupational performance deficits (Please refer to evaluation for details):  ADL's;IADL's    Rehab Potential  Excellent    Current Impairments/barriers affecting progress:  Nondominant LUE weakness, incoordination limiting ADL, and IADL functioning.    OT Frequency  2x / week    OT Duration  2 weeks    OT Treatment/Interventions  Self-care/ADL training;DME and/or AE instruction;Patient/family education;Therapeutic activities;Therapeutic exercise;Neuromuscular education    Consulted and Agree with Plan of Care  Patient       Patient will benefit from skilled therapeutic intervention in order to improve the following deficits and impairments:  Abnormal gait, Pain, Impaired UE functional use, Decreased strength, Decreased coordination  Visit Diagnosis: Muscle weakness (generalized)  Other lack of coordination    Problem List Patient Active Problem List   Diagnosis Date Noted  . Tobacco abuse disorder 01/15/2017  . Blurry vision 01/15/2017  . Left hemiparesis ([place])  01/06/2017  . HTN (hypertension) 01/06/2017  . CVA (cerebral vascular accident) ([place])  01/06/2017   Amy T Tomasita Morrow, OTR/L, CLT  Lovett,Amy 02/19/2017, 10:00 AM  [place], [place], [address] Phone: [phone]   Fax:  [phone]  Name: Angel Lane MRN: [id number] Date of Birth: 03/26/1950

## 2017-02-19 ENCOUNTER — Ambulatory Visit: Payer: Medicare Other

## 2017-02-19 ENCOUNTER — Encounter: Payer: Medicare Other | Admitting: Occupational Therapy

## 2017-02-19 ENCOUNTER — Other Ambulatory Visit: Payer: Self-pay | Admitting: Family Medicine

## 2017-02-19 MED ORDER — HYDROCHLOROTHIAZIDE 12.5 MG PO TABS: 12.5000 mg | ORAL_TABLET | Freq: Every day | ORAL | 2 refills | Status: DC

## 2017-02-19 NOTE — Telephone Encounter (Signed)
[facility] faxed refill request for the following medications:  hydrochlorothiazide (HYDRODIURIL) 12.5 MG tablet   90 day supply  Last Rx: 01/15/17 Please advise. Thanks TNP

## 2017-02-19 NOTE — Telephone Encounter (Signed)
Please review. Pharmacy requesting a 90 day supply. Thanks!

## 2017-02-20 ENCOUNTER — Ambulatory Visit: Payer: Federal, State, Local not specified - PPO | Admitting: Occupational Therapy

## 2017-02-20 ENCOUNTER — Encounter: Payer: Self-pay | Admitting: Occupational Therapy

## 2017-02-20 DIAGNOSIS — M6281 Muscle weakness (generalized): Secondary | ICD-10-CM | POA: Diagnosis not present

## 2017-02-20 DIAGNOSIS — R278 Other lack of coordination: Secondary | ICD-10-CM

## 2017-02-20 NOTE — Therapy (Signed)
[place] MAIN [facility] SERVICES [address] [place], [place], [address] Phone: [phone]   Fax:  [phone]  Occupational Therapy Treatment  Patient Details  Name: Angel Lane MRN: [id number] Date of Birth: 1950/07/11 Referring Provider (Historical): Dr. Brita Romp   Encounter Date: 02/20/2017  OT End of Session - 02/20/17 1315    Visit Number  6    Number of Visits  24    Date for OT Re-Evaluation  04/13/17    OT Start Time  1300    OT Stop Time  1345    OT Time Calculation (min)  45 min    Activity Tolerance  Patient tolerated treatment well    Behavior During Therapy  [facility] for tasks assessed/performed       Past Medical History:  Diagnosis Date  . Hypertension     Past Surgical History:  Procedure Laterality Date  . ABDOMINAL SURGERY    . CHOLECYSTECTOMY      There were no vitals filed for this visit.  Subjective Assessment - 02/20/17 1314    Subjective   Pt. reports fatigue. Pt. Reports working out on her home gym this morning.   Patient is accompained by:  Family member    Pertinent History  Pt. is a 66 y.o. female who was admitted suffered an Acute Ischemic CVA on Nov. 2nd, 1018. Pt. was admitted to [facility] with left sided weakness. Pt. returned home from [facility] with her husband assist, and now is ready for outpatient services.    Patient Stated Goals  To be as independent as she was before.     Currently in Pain?  No/denies       OT TREATMENT    Therapeutic Exercise:  Pt. Worked on the [employer] for 8 min. With constant monitoring of the BUEs. Pt. Worked on changing, and alternating forward reverse position every 2 min. Rest breaks were required. Pt. Worked on level 2.0. Pt. performed 2# dowel ex. For UE strengthening secondary to weakness. Bilateral shoulder flexion, chest press, circular patterns, and elbow flexion/extension were performed.2# dumb bell for shoulder flexion, and external rotation. 3# dumbbell ex. for  elbow flexion and extension,  forearm supination/pronation, 2# for wrist flexion/extension, and radial deviation. Pt. requires rest breaks and verbal cues for proper technique.                            OT Long Term Goals - 02/18/17 0959      OT LONG TERM GOAL #1   Title  Pt. will be independent with HEP for LUE strength, and coordination skills.    Baseline  Eval: Requires assist    Time  12    Period  Weeks    Status  Partially Met      OT LONG TERM GOAL #2   Title  Pt. will increase LUE strength by 2 mm grades to be able sweep independently.    Baseline  Eval: Pt. has difficulty    Time  12    Period  Weeks    Status  Partially Met      OT LONG TERM GOAL #3   Title  Pt. will increase left grip strength by 5# to be able to hold, and use a vacuum.    Baseline  Eval: R: 50#, L: 20#    Time  12    Period  Weeks    Status  Achieved  OT LONG TERM GOAL #4   Title  Pt. will improve left lateral pinch strength by 2# to be able to cut meat efficiently.    Baseline  Eval: R: 17#, L: 13#    Time  12    Period  Weeks    Status  Partially Met      OT LONG TERM GOAL #5   Title  Pt. will increase left 3pt. pinch strength by 2# to be able to clip nails.    Baseline  Eval:  R: 16#, L: 10#    Time  12    Period  Weeks    Status  Partially Met      OT LONG TERM GOAL #6   Title  Pt. will improve [facility] skills by 3 sec. of speed to be able to be able to ttishoes, and button efficiently.    Baseline  Eval: R: 19 sec., L: 33 sec.    Time  12    Period  Weeks    Status  Achieved            Plan - 02/20/17 1320    Clinical Impression Statement  Pt. reprots she is trying to engage her LUE and hand during ADL, and IADL tasks. Pt. is having difficulty using her hand right hand duirng cooking tasks, grasping, and [facility] skills.  Pt. continues to work on improving UE strength, and coordination skills for improved ADl, and IADL performance tasks.    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    Occupational performance deficits (Please refer to evaluation for details):  ADL's;IADL's    Rehab Potential  Excellent    Current Impairments/barriers affecting progress:  Nondominant LUE weakness, incoordination limiting ADL, and IADL functioning.    OT Frequency  2x / week    OT Treatment/Interventions  Self-care/ADL training;DME and/or AE instruction;Patient/family education;Therapeutic activities;Therapeutic exercise;Neuromuscular education    Clinical Decision Making  Several treatment options, min-mod task modification necessary       Patient will benefit from skilled therapeutic intervention in order to improve the following deficits and impairments:  Abnormal gait, Pain, Impaired UE functional use, Decreased strength, Decreased coordination  Visit Diagnosis: Muscle weakness (generalized)  Other lack of coordination    Problem List Patient Active Problem List   Diagnosis Date Noted  . Tobacco abuse disorder 01/15/2017  . Blurry vision 01/15/2017  . Left hemiparesis ([place]) 01/06/2017  . HTN (hypertension) 01/06/2017  . CVA (cerebral vascular accident) ([place]) 01/06/2017    Angel Lane 02/20/2017, 1:30 PM  [place] MAIN [facility] SERVICES [address] [place], [place], [address] Phone: [phone]   Fax:  [phone]  Name: Angel Lane MRN: [id number] Date of Birth: Jan 05, 1951

## 2017-02-22 ENCOUNTER — Ambulatory Visit: Payer: Federal, State, Local not specified - PPO | Admitting: Occupational Therapy

## 2017-02-22 ENCOUNTER — Encounter: Payer: Self-pay | Admitting: Occupational Therapy

## 2017-02-22 DIAGNOSIS — R278 Other lack of coordination: Secondary | ICD-10-CM

## 2017-02-22 DIAGNOSIS — M6281 Muscle weakness (generalized): Secondary | ICD-10-CM

## 2017-02-22 NOTE — Therapy (Signed)
[place] MAIN [facility] SERVICES [address] [place], [place], [address] Phone: [phone]   Fax:  [phone]  Occupational Therapy Treatment  Patient Details  Name: Angel Lane MRN: [id number] Date of Birth: 1950-07-10 Referring Provider (Historical): Dr. Brita Romp   Encounter Date: 02/22/2017  OT End of Session - 02/22/17 1317    Visit Number  7    Number of Visits  24    Date for OT Re-Evaluation  04/13/17    OT Start Time  1300    OT Stop Time  1345    OT Time Calculation (min)  45 min    Activity Tolerance  Patient tolerated treatment well    Behavior During Therapy  [facility] for tasks assessed/performed       Past Medical History:  Diagnosis Date  . Hypertension     Past Surgical History:  Procedure Laterality Date  . ABDOMINAL SURGERY    . CHOLECYSTECTOMY      There were no vitals filed for this visit.  Subjective Assessment - 02/22/17 1314    Subjective   Pt. reports she has an MD appointment on 03/07/2017    Patient is accompained by:  Family member    Patient Stated Goals  To be as independent as she was before.     Currently in Pain?  No/denies       OT TREATMENT    Neuro muscular re-education:  Pt. performed [facility] skills training to improve speed and dexterity needed for ADL tasks and writing. Pt. demonstrated grasping 1 inch sticks,  inch cylindrical collars, and  inch flat washers on the [employer] pegboard. Pt. performed grasping each item with her 2nd digit and thumb, and storing them in the palm. Pt. presented with difficulty storing  inch objects at a time in the palmar aspect of the hand. Pt. Requires visual, and verbal cues. Pt. has difficulty to translatory movements of the hand with increased difficulty with the 1/4" washers.  Therapeutic Exercise:  Pt. Worked on the [employer] for 8 min. With constant monitoring of the BUEs. Pt. Worked on changing, and alternating forward reverse position every 2 min.  Multiple Rest breaks were required. Pt. performed gross gripping with grip strengthener. Pt. worked on sustaining grip while grasping pegs and reaching at various heights. Gripper was placed in the 3rd resistive slot with the white resistive spring. Pt. Worked on pinch strengthening in the left hand for lateral, and 3pt. pinch using yellow, red, green, and blue resistive clips. Pt. worked on placing the clips at various vertical and horizontal angles. Tactile and verbal cues were required for eliciting the desired movement.                       OT Education - 02/22/17 1315    Education provided  Yes    Education Details  LUE strength, coordination    Person(s) Educated  Patient    Methods  Explanation;Demonstration;Verbal cues    Comprehension  Verbalized understanding;Returned demonstration;Verbal cues required          OT Long Term Goals - 02/18/17 0959      OT LONG TERM GOAL #1   Title  Pt. will be independent with HEP for LUE strength, and coordination skills.    Baseline  Eval: Requires assist    Time  12    Period  Weeks    Status  Partially Met      OT LONG TERM GOAL #2  Title  Pt. will increase LUE strength by 2 mm grades to be able sweep independently.    Baseline  Eval: Pt. has difficulty    Time  12    Period  Weeks    Status  Partially Met      OT LONG TERM GOAL #3   Title  Pt. will increase left grip strength by 5# to be able to hold, and use a vacuum.    Baseline  Eval: R: 50#, L: 20#    Time  12    Period  Weeks    Status  Achieved      OT LONG TERM GOAL #4   Title  Pt. will improve left lateral pinch strength by 2# to be able to cut meat efficiently.    Baseline  Eval: R: 17#, L: 13#    Time  12    Period  Weeks    Status  Partially Met      OT LONG TERM GOAL #5   Title  Pt. will increase left 3pt. pinch strength by 2# to be able to clip nails.    Baseline  Eval:  R: 16#, L: 10#    Time  12    Period  Weeks    Status  Partially  Met      OT LONG TERM GOAL #6   Title  Pt. will improve [facility] skills by 3 sec. of speed to be able to be able to ttishoes, and button efficiently.    Baseline  Eval: R: 19 sec., L: 33 sec.    Time  12    Period  Weeks    Status  Achieved            Plan - 02/22/17 1318    Clinical Impression Statement  Pt. continues to fatigue quickly. Pt. reports she gets tired easily at home during activity. Pt. reports she is on a beta blocker, and tends to fatigue quicker. Pt. continues to work on improving LUE strength, activity tolerance, and coordination skills for improved engagement in daily ADl, and IADL tasks.    Occupational performance deficits (Please refer to evaluation for details):  ADL's;IADL's    Rehab Potential  Excellent    Current Impairments/barriers affecting progress:  Nondominant LUE weakness, incoordination limiting ADL, and IADL functioning.    OT Frequency  2x / week    OT Duration  2 weeks    OT Treatment/Interventions  Self-care/ADL training;DME and/or AE instruction;Patient/family education;Therapeutic activities;Therapeutic exercise;Neuromuscular education    Clinical Decision Making  Several treatment options, min-mod task modification necessary    Consulted and Agree with Plan of Care  Patient       Patient will benefit from skilled therapeutic intervention in order to improve the following deficits and impairments:  Abnormal gait, Pain, Impaired UE functional use, Decreased strength, Decreased coordination  Visit Diagnosis: Muscle weakness (generalized)  Other lack of coordination    Problem List Patient Active Problem List   Diagnosis Date Noted  . Tobacco abuse disorder 01/15/2017  . Blurry vision 01/15/2017  . Left hemiparesis ([place]) 01/06/2017  . HTN (hypertension) 01/06/2017  . CVA (cerebral vascular accident) ([place]) 01/06/2017    Harrel Carina, MS, OTR/L 02/22/2017, 1:43 PM  [place] MAIN [facility]  SERVICES [address] [place], [place], [address] Phone: [phone]   Fax:  [phone]  Name: Angel Lane MRN: [id number] Date of Birth: [birth year]

## 2017-02-23 ENCOUNTER — Ambulatory Visit: Payer: Medicare Other

## 2017-02-23 ENCOUNTER — Encounter: Payer: Medicare Other | Admitting: Occupational Therapy

## 2017-02-26 ENCOUNTER — Encounter: Payer: Medicare Other | Admitting: Occupational Therapy

## 2017-02-26 ENCOUNTER — Ambulatory Visit: Payer: Medicare Other

## 2017-03-01 ENCOUNTER — Ambulatory Visit: Payer: Federal, State, Local not specified - PPO | Admitting: Occupational Therapy

## 2017-03-01 ENCOUNTER — Encounter: Payer: Self-pay | Admitting: Occupational Therapy

## 2017-03-01 DIAGNOSIS — M6281 Muscle weakness (generalized): Secondary | ICD-10-CM | POA: Diagnosis not present

## 2017-03-01 DIAGNOSIS — R278 Other lack of coordination: Secondary | ICD-10-CM

## 2017-03-01 NOTE — Therapy (Signed)
[place] MAIN [facility] SERVICES [address] [place], [place], [address] Phone: [phone]   Fax:  [phone]  Occupational Therapy Treatment  Patient Details  Name: Angel Lane MRN: [id number] Date of Birth: June 06, 1950 Referring Provider (Historical): Dr. Brita Romp   Encounter Date: 03/01/2017  OT End of Session - 03/01/17 1319    Visit Number  8    Number of Visits  24    Date for OT Re-Evaluation  04/13/17    OT Start Time  1300    OT Stop Time  1345    OT Time Calculation (min)  45 min    Activity Tolerance  Patient tolerated treatment well    Behavior During Therapy  [facility] for tasks assessed/performed       Past Medical History:  Diagnosis Date  . Hypertension     Past Surgical History:  Procedure Laterality Date  . ABDOMINAL SURGERY    . CHOLECYSTECTOMY      There were no vitals filed for this visit.  Subjective Assessment - 03/01/17 1315    Subjective   Pt. reports she had to have her well replaced over Christmas.    Patient is accompained by:  Family member    Pertinent History  Pt. is a 66 y.o. female who was admitted suffered an Acute Ischemic CVA on Nov. 2nd, 1018. Pt. was admitted to [facility] with left sided weakness. Pt. returned home from [facility] with her husband assist, and now is ready for outpatient services.    Currently in Pain?  Yes    Pain Score  5     Pain Location  Hip    Pain Orientation  Left      OT TREATMENT    Neuro muscular re-education:  Pt. performed [facility] skills training to improve speed and dexterity needed for ADL tasks and writing. Pt. demonstrated grasping 1 inch sticks,  inch cylindrical collars, and  inch flat washers on the [employer] pegboard. Pt. performed grasping each item with her 2nd digit and thumb, and storing them in the palm. Pt. presented with difficulty storing  inch objects at a time in the palmar aspect of the hand.  Therapeutic Exercise:  Pt. worked on the [employer] for 8 min.  with constant monitoring of the BUEs. Pt. worked on changing, and alternating forward reverse position every 2 min. Multiple rest breaks were required. Pt. performed gross gripping with grip strengthener. Pt. worked on sustaining grip while grasping pegs and reaching at various heights. Gripper was placed in the 3rd resistive slot with the white resistive spring. Pt. Worked on pinch strengthening in the left hand for lateral, and 3pt. pinch using yellow, red, green, and blue resistive clips. Pt. worked on placing the clips at various vertical and horizontal angles. Tactile and verbal cues were required for eliciting the desired movement. Pt. worked on grasping, and moving the clips through her hand to place in position. Pt. dropped several clips when moving them through her hand.                           OT Education - 03/01/17 1319    Education provided  Yes    Education Details  LUE strength, and coordination    Person(s) Educated  Patient    Methods  Explanation;Demonstration;Verbal cues    Comprehension  Verbalized understanding;Returned demonstration;Verbal cues required          OT Long Term Goals - 02/18/17  4332      OT LONG TERM GOAL #1   Title  Pt. will be independent with HEP for LUE strength, and coordination skills.    Baseline  Eval: Requires assist    Time  12    Period  Weeks    Status  Partially Met      OT LONG TERM GOAL #2   Title  Pt. will increase LUE strength by 2 mm grades to be able sweep independently.    Baseline  Eval: Pt. has difficulty    Time  12    Period  Weeks    Status  Partially Met      OT LONG TERM GOAL #3   Title  Pt. will increase left grip strength by 5# to be able to hold, and use a vacuum.    Baseline  Eval: R: 50#, L: 20#    Time  12    Period  Weeks    Status  Achieved      OT LONG TERM GOAL #4   Title  Pt. will improve left lateral pinch strength by 2# to be able to cut meat efficiently.    Baseline  Eval: R:  17#, L: 13#    Time  12    Period  Weeks    Status  Partially Met      OT LONG TERM GOAL #5   Title  Pt. will increase left 3pt. pinch strength by 2# to be able to clip nails.    Baseline  Eval:  R: 16#, L: 10#    Time  12    Period  Weeks    Status  Partially Met      OT LONG TERM GOAL #6   Title  Pt. will improve [facility] skills by 3 sec. of speed to be able to be able to ttishoes, and button efficiently.    Baseline  Eval: R: 19 sec., L: 33 sec.    Time  12    Period  Weeks    Status  Achieved            Plan - 03/01/17 1320    Clinical Impression Statement  Pt. reports she feels like there is a delay in her Left hand when attempting to use it during functional tasks. Pt. continues to present with limited Activity tolerance requiring rest breaks. Pt. continues to work on improving UE strength, and coordination skills for improved ADL, and IADL functioning.    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    Occupational performance deficits (Please refer to evaluation for details):  ADL's;IADL's    Rehab Potential  Excellent    Current Impairments/barriers affecting progress:  Nondominant LUE weakness, incoordination limiting ADL, and IADL functioning.    OT Frequency  2x / week    OT Duration  2 weeks    OT Treatment/Interventions  Self-care/ADL training;DME and/or AE instruction;Patient/family education;Therapeutic activities;Therapeutic exercise;Neuromuscular education    Clinical Decision Making  Several treatment options, min-mod task modification necessary    Recommended Other Services  PT    Consulted and Agree with Plan of Care  Patient       Patient will benefit from skilled therapeutic intervention in order to improve the following deficits and impairments:  Abnormal gait, Pain, Impaired UE functional use, Decreased  strength, Decreased coordination  Visit Diagnosis: Muscle weakness (  generalized)  Other lack of coordination    Problem List Patient Active Problem List   Diagnosis Date Noted  . Tobacco abuse disorder 01/15/2017  . Blurry vision 01/15/2017  . Left hemiparesis ([place]) 01/06/2017  . HTN (hypertension) 01/06/2017  . CVA (cerebral vascular accident) ([place]) 01/06/2017    Angel Carina, Angel Lane, Angel Lane 03/01/2017, 1:28 PM  [place] MAIN [facility] SERVICES [address] [place], [place], [address] Phone: [phone]   Fax:  [phone]  Name: Angel Lane MRN: [id number] Date of Birth: 05-21-1950

## 2017-03-02 ENCOUNTER — Ambulatory Visit: Payer: Medicare Other

## 2017-03-02 ENCOUNTER — Encounter: Payer: Medicare Other | Admitting: Occupational Therapy

## 2017-03-08 ENCOUNTER — Encounter: Payer: Self-pay | Admitting: Speech Pathology

## 2017-03-08 ENCOUNTER — Other Ambulatory Visit: Payer: Self-pay

## 2017-03-08 ENCOUNTER — Ambulatory Visit: Payer: Federal, State, Local not specified - PPO | Attending: Family Medicine | Admitting: Speech Pathology

## 2017-03-08 DIAGNOSIS — R279 Unspecified lack of coordination: Secondary | ICD-10-CM | POA: Diagnosis present

## 2017-03-08 DIAGNOSIS — R269 Unspecified abnormalities of gait and mobility: Secondary | ICD-10-CM | POA: Insufficient documentation

## 2017-03-08 DIAGNOSIS — R278 Other lack of coordination: Secondary | ICD-10-CM | POA: Insufficient documentation

## 2017-03-08 DIAGNOSIS — M6281 Muscle weakness (generalized): Secondary | ICD-10-CM | POA: Diagnosis present

## 2017-03-08 DIAGNOSIS — R41841 Cognitive communication deficit: Secondary | ICD-10-CM | POA: Diagnosis present

## 2017-03-08 NOTE — Therapy (Signed)
[place] MAIN [facility] SERVICES [address] [place], [place], [address] Phone: [phone]   Fax:  [phone]  Speech Language Pathology Evaluation  Patient Details  Name: Angel Lane MRN: [id number] Date of Birth: 1950-06-27 Referring Provider: Virginia Crews   Encounter Date: 03/08/2017  End of Session - 03/08/17 1258    Visit Number  1    Number of Visits  9    Date for SLP Re-Evaluation  04/08/17    SLP Start Time  0900    SLP Stop Time   1000    SLP Time Calculation (min)  60 min    Activity Tolerance  Patient tolerated treatment well       Past Medical History:  Diagnosis Date  . Hypertension     Past Surgical History:  Procedure Laterality Date  . ABDOMINAL SURGERY    . CHOLECYSTECTOMY      There were no vitals filed for this visit.      SLP Evaluation [facility] - 03/08/17 0001      SLP Visit Information   SLP Received On  03/08/17    Referring Provider  Lavon Paganini M    Onset Date  01/05/2017    Medical Diagnosis  Right basal gamglia CVA      Subjective   Subjective  "I forget names and other things".    Patient/Family Stated Goal  Maximize cognitive communication ability      General Information   HPI  66 year old woman S/P right basal ganglia CVA 01/05/2017.      Prior Functional Status   Cognitive/Linguistic Baseline  Within functional limits      Cognition   Overall Cognitive Status  Impaired/Different from baseline      Auditory Comprehension   Overall Auditory Comprehension  Impaired    Overall Auditory Comprehension Comments  deficits due to reduced auditory attention/memory      Reading Comprehension   Reading Status  Within funtional limits      Expression   Primary Mode of Expression  Verbal      Verbal Expression   Overall Verbal Expression  Impaired    Level of Generative/Spontaneous Verbalization  Conversation      Written Expression   Written Expression  Within  Functional Limits      Oral Motor/Sensory Function   Overall Oral Motor/Sensory Function  Appears within functional limits for tasks assessed      Motor Speech   Overall Motor Speech  Appears within functional limits for tasks assessed      Standardized Assessments   Standardized Assessments   Montreal Cognitive Assessment (MOCA);Western Aphasia Battery revised       Montreal Cognitive Assessment (MOCA) Version: 8.1 Visuospatieal/Executive Alternating trail making       0/1 Visuoconstruction Skills (copy 3-d design) 1/1 Draw a clock     1/3 Naming     2/3 Attention Forward digit span    0/1 Backward digit span    1/1 Vigilance     1/1 Serial 7's     1/3 Language  Verbal Fluency     0/1 Repetition     1/2 Abstraction     2/2 Delayed Recall     0/5  Memory Index Score   2/15 Orientation     5/6 TOTAL      16/30       Normal  ? 26/30       Western Aphasia Battery- Screening  Spontaneous Speech  Information content   8/10       Fluency    9/10     Comprehension     Yes/No questions   10/10          Sequential Commands  10/10 (When cued to attend for "tricks")    Repetition    10/10     Naming    Object Naming   10/10       Screening Aphasia Quotient  95/100  Reading    10/10 (When allowed to have text to answer questions) Writing     10/10 Screening Language Quotient  96/100  SLP Education - 03/08/17 1258    Education provided  Yes    Education Details  valuation, plan of care    Person(s) Educated  Patient    Methods  Explanation    Comprehension  Verbalized understanding         SLP Long Term Goals - 03/08/17 1508      SLP LONG TERM GOAL #1   Title  Patient will demonstrate functional cognitive-communication skills for independent completion of personal responsibilities and leisure activities.    Time  4    Period  Weeks    Status  New    Target Date  04/08/17      SLP LONG TERM GOAL #2   Title  Patient will complete complex attention,  executive function, and memory activities with 80% accuracy.    Time  4    Period  Weeks    Status  New    Target Date  04/08/17      SLP LONG TERM GOAL #3   Title  Patient will identify cognitive-communication barriers and participate in developing functional compensatory strategies.    Time  4    Period  Weeks    Status  New    Target Date  04/08/17      SLP LONG TERM GOAL #4   Title  Patient will generate cogent, informative verbal responses to moderately complex cognitive linguistic tasks with 80% accuracy.    Time  4    Period  Weeks    Status  New    Target Date  04/08/17       Plan - 03/08/17 1259    Clinical Impression Statement  At 2 months post onset of basal ganglia CVA, the patient is presenting with mild cognitive communication deficits characterized by STM impairment, reduced sustained and alternating attention, reduced executive function, and reduced information content of spontaneous speech.  The patient scored 16/30 on the [facility] Cognitive Assessment (Normal  ? 26/30) and an aphasia quotient of 95/100 on the Western Aphasia Battery- Screening.  The patient will benefit from skilled speech therapy for restorative and compensatory treatment of cognitive communication deficits.    Speech Therapy Frequency  2x / week    Duration  1 week 8 weeks    Treatment/Interventions  Language facilitation;Compensatory techniques;Cognitive reorganization;Internal/external aids;Patient/family education;SLP instruction and feedback    Potential to Achieve Goals  Good    Potential Considerations  Ability to learn/carryover information;Co-morbidities;Cooperation/participation level;Medical prognosis;Severity of impairments;Family/community support    SLP Home Exercise Plan  To be determined    Consulted and Agree with Plan of Care  Patient       Patient will benefit from skilled therapeutic intervention in order to improve the following deficits and impairments:   Cognitive  communication deficit - Plan: SLP plan of care cert/re-cert    Problem List Patient Active Problem List  Diagnosis Date Noted  . Tobacco abuse disorder 01/15/2017  . Blurry vision 01/15/2017  . Left hemiparesis ([place]) 01/06/2017  . HTN (hypertension) 01/06/2017  . CVA (cerebral vascular accident) ([place]) 01/06/2017   Leroy Sea, [place], Susie 03/08/2017, 3:13 PM  [place] [address] [place], [place], [address] Phone: [phone]   Fax:  [phone]  Name: Angel Lane MRN: [id number] Date of Birth: Oct 15, 1950

## 2017-03-09 ENCOUNTER — Encounter: Payer: Medicare Other | Admitting: Occupational Therapy

## 2017-03-09 ENCOUNTER — Ambulatory Visit: Payer: Medicare Other

## 2017-03-12 ENCOUNTER — Ambulatory Visit: Payer: Medicare Other

## 2017-03-12 ENCOUNTER — Encounter: Payer: Medicare Other | Admitting: Occupational Therapy

## 2017-03-13 ENCOUNTER — Ambulatory Visit: Payer: Federal, State, Local not specified - PPO | Admitting: Physical Therapy

## 2017-03-13 ENCOUNTER — Encounter: Payer: Self-pay | Admitting: Physical Therapy

## 2017-03-13 VITALS — BP 143/83

## 2017-03-13 DIAGNOSIS — R278 Other lack of coordination: Secondary | ICD-10-CM

## 2017-03-13 DIAGNOSIS — R41841 Cognitive communication deficit: Secondary | ICD-10-CM | POA: Diagnosis not present

## 2017-03-13 DIAGNOSIS — R269 Unspecified abnormalities of gait and mobility: Secondary | ICD-10-CM

## 2017-03-13 DIAGNOSIS — R279 Unspecified lack of coordination: Secondary | ICD-10-CM

## 2017-03-13 DIAGNOSIS — M6281 Muscle weakness (generalized): Secondary | ICD-10-CM

## 2017-03-13 NOTE — Therapy (Addendum)
[place] MAIN [facility] SERVICES [address] [place], [place], [address] Phone: [phone]   Fax:  [phone]  Physical Therapy Treatment/ Discharge summary  Patient Details  Name: Angel Lane MRN: [id number] Date of Birth: 1950/10/28 Referring Provider: Lavon Paganini   Encounter Date: 03/13/2017  PT End of Session - 03/13/17 1347    Visit Number  5    Number of Visits  16    Date for PT Re-Evaluation  03/16/17    PT Start Time  0140    PT Stop Time  0215    PT Time Calculation (min)  35 min    Equipment Utilized During Treatment  Gait belt    Activity Tolerance  Patient tolerated treatment well    Behavior During Therapy  [facility] for tasks assessed/performed       Past Medical History:  Diagnosis Date  . Hypertension     Past Surgical History:  Procedure Laterality Date  . ABDOMINAL SURGERY    . CHOLECYSTECTOMY      Vitals:   03/13/17 1345  BP: (!) 143/83    Subjective Assessment - 03/13/17 1345    Subjective  Patient reports only doing her HEP each day. She is also walking outside.     Pertinent History  Pt admitted to hospital for R CVA with L side weakness on 01/05/17 and discharged 01/08/17. History includes previous L CVA and HTN. Since then she has been moving around at home and trying to get herself stronger. Walked into session with cane, but doesn't always use it. Gets pain in L groin when walking 2/10 pain. Patient wants to return to previous level of function, such as walking and playing with her three dogs.     Limitations  Standing;Walking;House hold activities;Other (comment)    How long can you sit comfortably?  N/A    How long can you stand comfortably?  3-4 minutes, Left leg starts burning    How long can you walk comfortably?   6  minutes    Patient Stated Goals  return to previous level of function.     Pain Score  0-No pain    Multiple Pain Sites  No       Performed outcome measures including Berg  balance test. Patient reached all goals except LEFS 56/80 but continued to improve with LEFS from a 50/80. Patient has 56/56 Merrilee Jansky and is not in a falls risk according to the TUG and 5 x sit to stand test. Patient was recommended to be DC ed and continue with a gym membership with her sliver sneakers for exercise.  Patient is agreement and is ready to be discharged.                        PT Education - 03/13/17 1346    Education provided  Yes    Education Details  taking BP daily    Person(s) Educated  Patient    Methods  Explanation    Comprehension  Verbalized understanding       PT Short Term Goals - 03/13/17 1415      PT SHORT TERM GOAL #1   Title  Patient will be independent in home exercise program to improve strength/mobility for better functional independence with ADLs.    Baseline  HEP given    Time  2    Period  Weeks    Status  Achieved      PT SHORT  TERM GOAL #2   Title  Patient (> 60 years old) will complete five times sit to stand test in < 20 seconds indicating an increased LE strength and improved balance.    Baseline  11/16: 24 seconds w/o hands    Time  2    Period  Weeks    Status  Achieved      PT SHORT TERM GOAL #3   Title   Patient will be independent in bending down towards floor and picking up small object (<5 pounds) and then stand back up without loss of balance as to improve ability to pick up and clean up room at home    Baseline  Pt. requires supervision to pick up objects from floor    Time  2    Period  Weeks    Status  Achieved        PT Long Term Goals - 03/13/17 1348      PT LONG TERM GOAL #1   Title   Patient (> 16 years old) will complete five times sit to stand test in < 15 seconds indicating an increased LE strength and improved balance.    Baseline  11/16: 24 seconds w/o hands1/8/19 ; 14.63 sec    Time  8    Period  Weeks    Status  Achieved      PT LONG TERM GOAL #2   Title  Patient will increase Berg  Balance score by > 6 points (51/56) to demonstrate decreased fall risk during functional activities.    Baseline  11/16: 45/56 03/13/17 56/56    Time  8    Period  Weeks    Status  Achieved      PT LONG TERM GOAL #3   Title  Patient will increase 10 meter walk test to >1.1ms as to improve gait speed for better community ambulation and to reduce fall risk.    Baseline  11/16: .714 03/13/17 1.28    Time  8    Period  Weeks    Status  Achieved      PT LONG TERM GOAL #4   Title  Patient will reduce timed up and go to <11 seconds to reduce fall risk and demonstrate improved transfer/gait ability.    Baseline  11/16: 16 seconds:03/13/17 9.14 sec    Time  8    Period  Weeks    Status  Achieved      PT LONG TERM GOAL #5   Title  Patient will increase lower extremity functional scale to >60/80 to demonstrate improved functional mobility and increased tolerance with ADLs.     Baseline  11/16: 50/80: 56/80 03/13/17    Time  8    Period  Weeks    Status  Partially Met    Target Date  03/16/17            Plan - 03/13/17 1423    Clinical Impression Statement  Performed outcome measures including Berg balance test of 56/56.  Patient reached all goals except LEFS 56/80 but continued to improve with LEFS from a 50/80. Patient will be discharged to gym for continued exercise.    Clinical Presentation  Evolving    Clinical Decision Making  Moderate    Rehab Potential  Good    Clinical Impairments Affecting Rehab Potential  (-) recent CVA, reliance upon caregiver for ride  (+) good family support, prepared to work hard for improvements (dedicated to therapy), high PLOF  PT Frequency  2x / week    PT Duration  8 weeks    PT Treatment/Interventions  ADLs/Self Care Home Management;Cryotherapy;Iontophoresis '4mg'$ /ml Dexamethasone;Moist Heat;Traction;Ultrasound;Therapeutic exercise;Therapeutic activities;Functional mobility training;Stair training;Gait training;DME Instruction;Balance  training;Neuromuscular re-education;Patient/family education;Orthotic Fit/Training;Manual techniques;Passive range of motion;Energy conservation;Splinting;Taping;Visual/perceptual remediation/compensation    PT Next Visit Plan  review HEP, improve hip extension strength, weight shift, stairs    PT Home Exercise Plan  see sheet    Consulted and Agree with Plan of Care  Patient       Patient will benefit from skilled therapeutic intervention in order to improve the following deficits and impairments:  Abnormal gait, Decreased activity tolerance, Decreased balance, Decreased endurance, Decreased coordination, Decreased mobility, Decreased range of motion, Difficulty walking, Decreased strength, Impaired flexibility, Impaired perceived functional ability, Postural dysfunction, Improper body mechanics, Pain  Visit Diagnosis: Other lack of coordination  Muscle weakness (generalized)  Abnormality of gait and mobility  Unspecified lack of coordination     Problem List Patient Active Problem List   Diagnosis Date Noted  . Tobacco abuse disorder 01/15/2017  . Blurry vision 01/15/2017  . Left hemiparesis ([place]) 01/06/2017  . HTN (hypertension) 01/06/2017  . CVA (cerebral vascular accident) ([place]) 01/06/2017    Alanson Puls, PT DPT 03/13/2017, 2:24 PM  [place] MAIN [facility] SERVICES [address] [place], [place], [address] Phone: [phone]   Fax:  [phone]  Name: Angel Lane MRN: [id number] Date of Birth: 13-May-1950

## 2017-03-14 ENCOUNTER — Encounter: Payer: Medicare Other | Admitting: Occupational Therapy

## 2017-03-14 ENCOUNTER — Ambulatory Visit: Payer: Medicare Other

## 2017-03-15 ENCOUNTER — Encounter: Payer: Medicare Other | Admitting: Speech Pathology

## 2017-03-15 ENCOUNTER — Encounter: Payer: Self-pay | Admitting: Occupational Therapy

## 2017-03-15 ENCOUNTER — Ambulatory Visit: Payer: Federal, State, Local not specified - PPO | Admitting: Speech Pathology

## 2017-03-15 ENCOUNTER — Ambulatory Visit: Payer: Federal, State, Local not specified - PPO

## 2017-03-15 ENCOUNTER — Ambulatory Visit: Payer: Federal, State, Local not specified - PPO | Admitting: Occupational Therapy

## 2017-03-15 DIAGNOSIS — R41841 Cognitive communication deficit: Secondary | ICD-10-CM | POA: Diagnosis not present

## 2017-03-15 DIAGNOSIS — M6281 Muscle weakness (generalized): Secondary | ICD-10-CM

## 2017-03-15 DIAGNOSIS — R278 Other lack of coordination: Secondary | ICD-10-CM

## 2017-03-15 NOTE — Therapy (Signed)
[place] MAIN [facility] SERVICES [address] [place], [place], [address] Phone: [phone]   Fax:  [phone]  Occupational Therapy Treatment  Patient Details  Name: Angel Lane MRN: [id number] Date of Birth: Jun 06, 1950 Referring Provider: Lavon Paganini   Encounter Date: 03/15/2017  OT End of Session - 03/15/17 1131    Visit Number  9    Number of Visits  24    Date for OT Re-Evaluation  04/13/17    OT Start Time  1017    OT Stop Time  1100    OT Time Calculation (min)  43 min    Activity Tolerance  Patient tolerated treatment well    Behavior During Therapy  [facility] for tasks assessed/performed       Past Medical History:  Diagnosis Date  . Hypertension     Past Surgical History:  Procedure Laterality Date  . ABDOMINAL SURGERY    . CHOLECYSTECTOMY      There were no vitals filed for this visit.  Subjective Assessment - 03/15/17 1130    Subjective   Pt. reports being tired today.    Currently in Pain?  No/denies       OT TREATMENT    Neuro muscular re-education:  Pt. Worked on grasping coins from a tabletop surface, placing them into a resistive container, and pushing them through the slot while isolating his 2nd digit. The position of the container was placed on a vertical angel to incorporate shoulder elevation, and wrist extension. Pt. performed [facility] tasks using the Grooved pegboard. Pt. worked on grasping the grooved pegs from a horizontal position, and moving the pegs to a vertical position in the hand to prepare for placing them in the grooved slot. Pt. Worked on the pegboard at the tabletop, as well as at a vertical angle. Pt. Worked on flipping cards while alternating thumb on fingers, and fingers on thumb and increasing speed.  Therapeutic Exercise:  Pt. Worked on the [employer] for 8 min. With constant monitoring of the BUEs. Pt. Worked on changing, and alternating forward reverse position every 2 min. Rest breaks  were required. On level 4.0. Pt. performed gross gripping with grip strengthener. Pt. worked on sustaining grip while grasping pegs and reaching at various heights. Gripper was placed in the 3rd resistive slot with the white resistive spring.                       OT Education - 03/15/17 1130    Education provided  Yes    Education Details  [place], ther.ex.    Person(s) Educated  Patient    Methods  Explanation    Comprehension  Verbalized understanding          OT Long Term Goals - 02/18/17 0959      OT LONG TERM GOAL #1   Title  Pt. will be independent with HEP for LUE strength, and coordination skills.    Baseline  Eval: Requires assist    Time  12    Period  Weeks    Status  Partially Met      OT LONG TERM GOAL #2   Title  Pt. will increase LUE strength by 2 mm grades to be able sweep independently.    Baseline  Eval: Pt. has difficulty    Time  12    Period  Weeks    Status  Partially Met      OT LONG TERM GOAL #3  Title  Pt. will increase left grip strength by 5# to be able to hold, and use a vacuum.    Baseline  Eval: R: 50#, L: 20#    Time  12    Period  Weeks    Status  Achieved      OT LONG TERM GOAL #4   Title  Pt. will improve left lateral pinch strength by 2# to be able to cut meat efficiently.    Baseline  Eval: R: 17#, L: 13#    Time  12    Period  Weeks    Status  Partially Met      OT LONG TERM GOAL #5   Title  Pt. will increase left 3pt. pinch strength by 2# to be able to clip nails.    Baseline  Eval:  R: 16#, L: 10#    Time  12    Period  Weeks    Status  Partially Met      OT LONG TERM GOAL #6   Title  Pt. will improve [facility] skills by 3 sec. of speed to be able to be able to ttishoes, and button efficiently.    Baseline  Eval: R: 19 sec., L: 33 sec.    Time  12    Period  Weeks    Status  Achieved            Plan - 03/15/17 1132    Clinical Impression Statement  Pt. was fatigued today, and required rest breaks.  Pt. has a stress test scheduled for the end of the week. Pt. continues to present with limited UE strength, and coordination skills.. Pt. reports her hand is still delyed during activity. Pt. conitnues to work on improving UE functioning for improved use during ADLs, and IADLs.    Occupational performance deficits (Please refer to evaluation for details):  ADL's;IADL's    Rehab Potential  Excellent    Current Impairments/barriers affecting progress:  Nondominant LUE weakness, incoordination limiting ADL, and IADL functioning.    OT Frequency  2x / week    OT Duration  2 weeks    OT Treatment/Interventions  Self-care/ADL training;DME and/or AE instruction;Patient/family education;Therapeutic activities;Therapeutic exercise;Neuromuscular education    Clinical Decision Making  Several treatment options, min-mod task modification necessary    Consulted and Agree with Plan of Care  Patient       Patient will benefit from skilled therapeutic intervention in order to improve the following deficits and impairments:  Abnormal gait, Pain, Impaired UE functional use, Decreased strength, Decreased coordination  Visit Diagnosis: Muscle weakness (generalized)  Other lack of coordination    Problem List Patient Active Problem List   Diagnosis Date Noted  . Tobacco abuse disorder 01/15/2017  . Blurry vision 01/15/2017  . Left hemiparesis ([place]) 01/06/2017  . HTN (hypertension) 01/06/2017  . CVA (cerebral vascular accident) ([place]) 01/06/2017    Harrel Carina, MS, OTR/L 03/15/2017, 11:39 AM  [place] MAIN [facility] SERVICES [address] [place], [place], [address] Phone: [phone]   Fax:  [phone]  Name: Angel Lane MRN: [id number] Date of Birth: [birth year]

## 2017-03-16 ENCOUNTER — Ambulatory Visit: Payer: Medicare Other

## 2017-03-16 ENCOUNTER — Encounter: Payer: Self-pay | Admitting: Speech Pathology

## 2017-03-16 ENCOUNTER — Ambulatory Visit: Payer: Federal, State, Local not specified - PPO | Admitting: Family Medicine

## 2017-03-16 ENCOUNTER — Encounter: Payer: Medicare Other | Admitting: Occupational Therapy

## 2017-03-16 NOTE — Therapy (Signed)
[place] MAIN [facility] SERVICES [address] [place], [place], [address] Phone: [phone]   Fax:  [phone]  Speech Language Pathology Treatment  Patient Details  Name: Angel Lane MRN: [id number] Date of Birth: 24-Nov-1950 Referring Provider: Virginia Crews   Encounter Date: 03/15/2017  End of Session - 03/16/17 1045    Visit Number  2    Number of Visits  9    Date for SLP Re-Evaluation  04/08/17    SLP Start Time  4    SLP Stop Time   1155    SLP Time Calculation (min)  55 min    Activity Tolerance  Patient tolerated treatment well       Past Medical History:  Diagnosis Date  . Hypertension     Past Surgical History:  Procedure Laterality Date  . ABDOMINAL SURGERY    . CHOLECYSTECTOMY      There were no vitals filed for this visit.  Subjective Assessment - 03/16/17 1044    Subjective  Patient reports forgetting things while completing routine activities            ADULT SLP TREATMENT - 03/16/17 0001      General Information   Behavior/Cognition  Alert;Cooperative;Pleasant mood    HPI   67 year old woman S/P right basal ganglia CVA 01/05/2017.       Treatment Provided   Treatment provided  Cognitive-Linquistic      Pain Assessment   Pain Assessment  No/denies pain      Cognitive-Linquistic Treatment   Treatment focused on  Cognition;Patient/family/caregiver education    Skilled Treatment  PATIENT EDUCATION: Patient given handouts RE: memory and memory strategies.  ORGANIZATION: Patient easily completed a calendar/scheduling activity.  Reviewed monthly/weekly planner option for self-organizing. COGNITION: Patient introduced to Perplexor puzzles.  Patient requires cues to maintain general rules of completing the puzzles and to determine information provided by the clues.  MEMORY: Patient given the first 10 exercises of the Brainwave: Memory program.        Assessment / Recommendations / [place] with current plan of care      Progression Toward Goals   Progression toward goals  Progressing toward goals       SLP Education - 03/16/17 1045    Education provided  Yes    Education Details  Memory handouts, slef-organization tools    Person(s) Educated  Patient    Methods  Explanation    Comprehension  Verbalized understanding         SLP Long Term Goals - 03/08/17 1508      SLP LONG TERM GOAL #1   Title  Patient will demonstrate functional cognitive-communication skills for independent completion of personal responsibilities and leisure activities.    Time  4    Period  Weeks    Status  New    Target Date  04/08/17      SLP LONG TERM GOAL #2   Title  Patient will complete complex attention, executive function, and memory activities with 80% accuracy.    Time  4    Period  Weeks    Status  New    Target Date  04/08/17      SLP LONG TERM GOAL #3   Title  Patient will identify cognitive-communication barriers and participate in developing functional compensatory strategies.    Time  4    Period  Weeks    Status  New  Target Date  04/08/17      SLP LONG TERM GOAL #4   Title  Patient will generate cogent, informative verbal responses to moderately complex cognitive linguistic tasks with 80% accuracy.    Time  4    Period  Weeks    Status  New    Target Date  04/08/17       Plan - 03/16/17 1046    Clinical Impression Statement  The patient is open to learning new strategies to improve functional memory.    Speech Therapy Frequency  2x / week    Duration  4 weeks    Treatment/Interventions  Language facilitation;Compensatory techniques;Cognitive reorganization;Internal/external aids;Patient/family education;SLP instruction and feedback    Potential to Achieve Goals  Good    Potential Considerations  Ability to learn/carryover information;Co-morbidities;Cooperation/participation level;Medical prognosis;Severity of impairments;Family/community support     SLP Home Exercise Plan  Begin Brainwave: Memory program    Consulted and Agree with Plan of Care  Patient       Patient will benefit from skilled therapeutic intervention in order to improve the following deficits and impairments:   Cognitive communication deficit    Problem List Patient Active Problem List   Diagnosis Date Noted  . Tobacco abuse disorder 01/15/2017  . Blurry vision 01/15/2017  . Left hemiparesis ([place]) 01/06/2017  . HTN (hypertension) 01/06/2017  . CVA (cerebral vascular accident) ([place]) 01/06/2017   [place], [place], [place] 03/16/2017, 10:47 AM  [place] MAIN [facility] SERVICES [address] [place], [place], [address] Phone: [phone]   Fax:  [phone]   Name: KYLLIE PETTIJOHN MRN: [id number] Date of Birth: June 29, 1950

## 2017-03-20 ENCOUNTER — Encounter: Payer: Medicare Other | Admitting: Occupational Therapy

## 2017-03-20 ENCOUNTER — Ambulatory Visit: Payer: Medicare Other

## 2017-03-20 ENCOUNTER — Encounter: Payer: Medicare Other | Admitting: Speech Pathology

## 2017-03-20 ENCOUNTER — Ambulatory Visit: Payer: Medicare Other | Admitting: Physical Therapy

## 2017-03-22 ENCOUNTER — Encounter: Payer: Medicare Other | Admitting: Speech Pathology

## 2017-03-22 ENCOUNTER — Ambulatory Visit: Payer: Medicare Other | Admitting: Physical Therapy

## 2017-03-22 ENCOUNTER — Ambulatory Visit: Payer: Federal, State, Local not specified - PPO | Admitting: Speech Pathology

## 2017-03-23 ENCOUNTER — Encounter: Payer: Self-pay | Admitting: Family Medicine

## 2017-03-23 ENCOUNTER — Ambulatory Visit (INDEPENDENT_AMBULATORY_CARE_PROVIDER_SITE_OTHER): Payer: Federal, State, Local not specified - PPO | Admitting: Family Medicine

## 2017-03-23 ENCOUNTER — Ambulatory Visit: Payer: Medicare Other

## 2017-03-23 ENCOUNTER — Encounter: Payer: Medicare Other | Admitting: Occupational Therapy

## 2017-03-23 VITALS — BP 136/76 | HR 72 | Temp 98.2°F | Resp 16 | Wt 181.0 lb

## 2017-03-23 DIAGNOSIS — I10 Essential (primary) hypertension: Secondary | ICD-10-CM

## 2017-03-23 DIAGNOSIS — Z720 Tobacco use: Secondary | ICD-10-CM

## 2017-03-23 DIAGNOSIS — G4700 Insomnia, unspecified: Secondary | ICD-10-CM

## 2017-03-23 DIAGNOSIS — Z23 Encounter for immunization: Secondary | ICD-10-CM

## 2017-03-23 DIAGNOSIS — I639 Cerebral infarction, unspecified: Secondary | ICD-10-CM

## 2017-03-23 MED ORDER — TRAZODONE HCL 50 MG PO TABS: 25.0000 mg | ORAL_TABLET | Freq: Every evening | ORAL | 3 refills | Status: DC | PRN

## 2017-03-23 NOTE — Assessment & Plan Note (Signed)
Doing well Neuro exam stable/improved with better strength on L side Discussed again the importance of BP and cholesterol control to prevent future strokes She agrees to continue lipitor Continue to follow with neurology Do not suspect recurrent TIAs with only symptom of L cheek numbness Advised her to discuss at f/u visit with Dr.Shah

## 2017-03-23 NOTE — Assessment & Plan Note (Signed)
3-5 min discussion regarding importance of cessation especially following stroke Advised to start Wellbutrin as prescribed by Dr Manuella Ghazi

## 2017-03-23 NOTE — Assessment & Plan Note (Signed)
Seems to be interfering significantly with QoL Will try trazodone qhs prn F/u in 65months and can consider dose titration

## 2017-03-23 NOTE — Progress Notes (Signed)
Patient: Angel Lane Female    DOB: March 04, 1951   67 y.o.   MRN: [id number] Visit Date: 03/23/2017  Today's Provider: Lavon Paganini, MD   I, Martha Clan, CMA, am acting as scribe for Lavon Paganini, MD.  No chief complaint on file.  Subjective:    HPI      Hypertension, follow-up, h/o CVA:  BP Readings from Last 3 Encounters:  03/13/17 (!) 143/83  01/15/17 (!) 142/90  01/08/17 (!) 139/95    She was last seen for a hospital follow up for CVA 4 weeks ago. She is concerned because the left side of her face continues to "feels numb" at times. This is an intermittent problem. She also states she can not stand too long without feeling weak.  Was discharged from PT because she met her goals. BP at that visit was 142/90. Management since that visit includes continuing Metoprolol and adding HCTZ 12.5 mg. (BMP needs to be rechecked today.) She reports excellent compliance with treatment. She is having side effects. Dizziness, fatigue, malaise. She is not exercising. Walks in her neighborhood, and has gym equipment at home. She is adherent to low salt diet.   Outside blood pressures are 120/60. She is experiencing chest pressure/discomfort, claudication, dyspnea, exertional chest pressure/discomfort, fatigue and orthopnea.  - occurring at rest, No associated SOB, Does get SOB with exertions, just had eval and stress test with cardiology Patient denies chest pain, irregular heart beat, lower extremity edema, near-syncope, palpitations and syncope.   Cardiovascular risk factors include advanced age (older than 68 for men, 38 for women), hypertension and smoking/ tobacco exposure. She saw cardiology on 03/07/2017. Use of agents associated with hypertension: none.     Weight trend: increasing steadily Wt Readings from Last 3 Encounters:  01/15/17 177 lb (80.3 kg)  01/06/17 177 lb 1.6 oz (80.3 kg)  12/23/16 179 lb (81.2 kg)    Current diet: in general, a "healthy"  diet    ------------------------------------------------------------------------  Insomnia - Goes to bed at 8 or 9 pm - Cat naps on and off until wakingat 2am - Falls back asleep at 5am and sleeps until 9am - feels tired through the day   Follow up for Tobacco Abuse  The patient was last seen for this 4 weeks ago. Changes made at last visit include encouraging pt to continue weaning down on patches.  Pt states she is smoking 1/2 PPD now, which is decreased from 1 PPD. Pt saw her neurologist, Dr. Manuella Ghazi, on 01/31/2017. He started pt on Wellbutrin for smoking cessation. She states she has not started this medication due to possible side effects of dizziness. She states she can not be dizzy while driving to pick up her daughter every afternoon at 2:00 pm.  ------------------------------------------------------------------------------------ She would like to discuss her atorvastatin. She read this medication increases chances of becoming diabetic.    Allergies  Allergen Reactions  . Penicillins Other (See Comments)    chills     Current Outpatient Medications:  .  aspirin 325 MG EC tablet, Take 1 tablet (325 mg total) by mouth daily., Disp: 90 tablet, Rfl: 3 .  atorvastatin (LIPITOR) 40 MG tablet, Take 1 tablet (40 mg total) by mouth daily at 6 PM., Disp: 90 tablet, Rfl: 3 .  fluticasone (FLONASE) 50 MCG/ACT nasal spray, Place 2 sprays daily into both nostrils., Disp: 1 g, Rfl: 2 .  hydrochlorothiazide (HYDRODIURIL) 12.5 MG tablet, Take 1 tablet (12.5 mg total) by mouth daily., Disp:  90 tablet, Rfl: 2 .  metoprolol tartrate (LOPRESSOR) 25 MG tablet, Take 1 tablet (25 mg total) by mouth 2 (two) times daily., Disp: 180 tablet, Rfl: 3 .  buPROPion (WELLBUTRIN XL) 150 MG 24 hr tablet, Take by mouth., Disp: , Rfl:  .  nicotine (NICODERM CQ - DOSED IN MG/24 HOURS) 21 mg/24hr patch, Place 1 patch (21 mg total) daily onto the skin. (Patient not taking: Reported on 03/23/2017), Disp: 28 patch,  Rfl: 0  Review of Systems  Constitutional: Positive for activity change, appetite change, diaphoresis (night sweats), fatigue and unexpected weight change (gain). Negative for chills and fever.  Respiratory: Positive for apnea (sleep apnea) and shortness of breath.   Cardiovascular: Positive for chest pain. Negative for palpitations and leg swelling.  Neurological: Positive for dizziness, weakness, light-headedness, numbness and headaches. Negative for syncope, facial asymmetry and speech difficulty.    Social History   Tobacco Use  . Smoking status: Current Every Day Smoker    Packs/day: 0.50    Years: 40.00    Pack years: 20.00    Types: Cigarettes    Last attempt to quit: 01/09/2017    Years since quitting: 0.2  . Smokeless tobacco: Never Used  . Tobacco comment: is currently on Nicoderm patch  Substance Use Topics  . Alcohol use: No   Objective:   There were no vitals taken for this visit. There were no vitals filed for this visit.   Physical Exam  Constitutional: She is oriented to person, place, and time. She appears well-developed and well-nourished. No distress.  HENT:  Head: Normocephalic and atraumatic.  Right Ear: External ear normal.  Left Ear: External ear normal.  Mouth/Throat: Oropharynx is clear and moist.  Eyes: Conjunctivae and EOM are normal. Pupils are equal, round, and reactive to light. No scleral icterus.  Neck: Neck supple.  Cardiovascular: Normal rate, regular rhythm, normal heart sounds and intact distal pulses.  No murmur heard. Pulmonary/Chest: Effort normal and breath sounds normal. No respiratory distress. She has no wheezes. She has no rales.  Abdominal: Soft. She exhibits no distension. There is no tenderness. There is no rebound.  Musculoskeletal: She exhibits no edema or deformity.  Lymphadenopathy:    She has no cervical adenopathy.  Neurological: She is alert and oriented to person, place, and time. She has normal strength. She displays  no tremor. No sensory deficit. She exhibits normal muscle tone.  Slight R sided facial droop with dysarthria.    Skin: Skin is warm and dry. No rash noted.  Psychiatric: She has a normal mood and affect. Her behavior is normal.  Vitals reviewed.       Assessment & Plan:      Problem List Items Addressed This Visit      Cardiovascular and Mediastinum   HTN (hypertension) - Primary    Uncontrolled initially, but below goal on recheck Recent home readings and cardiology wnl  Continue current medications Recent BMP reviewed F/u in 3 months      CVA (cerebral vascular accident) ([place])    Doing well Neuro exam stable/improved with better strength on L side Discussed again the importance of BP and cholesterol control to prevent future strokes She agrees to continue lipitor Continue to follow with neurology Do not suspect recurrent TIAs with only symptom of L cheek numbness Advised her to discuss at f/u visit with Dr.Shah        Other   Tobacco abuse disorder    3-5 min discussion regarding importance of  cessation especially following stroke Advised to start Wellbutrin as prescribed by Dr Manuella Ghazi      Insomnia    Seems to be interfering significantly with QoL Will try trazodone qhs prn F/u in 9month and can consider dose titration       Other Visit Diagnoses    Need for pneumococcal vaccination       Relevant Orders   Pneumococcal conjugate vaccine 13-valent IM (Completed)       Return in about 3 months (around 06/21/2017) for HTN, insomnia f/u.   The entirety of the information documented in the History of Present Illness, Review of Systems and Physical Exam were personally obtained by me. Portions of this information were initially documented by ERaquel SarnaRatchford, CMA and reviewed by me for thoroughness and accuracy.    BVirginia Crews MD, MPH [facility]1/18/2019 12:09 PM

## 2017-03-23 NOTE — Patient Instructions (Signed)

## 2017-03-23 NOTE — Assessment & Plan Note (Signed)
Uncontrolled initially, but below goal on recheck Recent home readings and cardiology wnl  Continue current medications Recent BMP reviewed F/u in 3 months

## 2017-03-27 ENCOUNTER — Ambulatory Visit: Payer: Medicare Other

## 2017-03-27 ENCOUNTER — Encounter: Payer: Self-pay | Admitting: Occupational Therapy

## 2017-03-27 ENCOUNTER — Encounter: Payer: Medicare Other | Admitting: Occupational Therapy

## 2017-03-27 ENCOUNTER — Encounter: Payer: Medicare Other | Admitting: Speech Pathology

## 2017-03-27 ENCOUNTER — Ambulatory Visit: Payer: Federal, State, Local not specified - PPO | Admitting: Occupational Therapy

## 2017-03-27 DIAGNOSIS — R278 Other lack of coordination: Secondary | ICD-10-CM

## 2017-03-27 DIAGNOSIS — M6281 Muscle weakness (generalized): Secondary | ICD-10-CM

## 2017-03-27 DIAGNOSIS — R41841 Cognitive communication deficit: Secondary | ICD-10-CM | POA: Diagnosis not present

## 2017-03-27 NOTE — Therapy (Signed)
[place] MAIN [facility] SERVICES [address] [place], [place], [address] Phone: [phone]   Fax:  [phone]  Occupational Therapy Treatment  Patient Details  Name: Angel Lane MRN: [id number] Date of Birth: 22-Jun-1950 Referring Provider: Lavon Paganini   Encounter Date: 03/27/2017  OT End of Session - 03/27/17 1023    Visit Number  10    Number of Visits  24    Date for OT Re-Evaluation  04/13/17    OT Start Time  1015    OT Stop Time  1100    OT Time Calculation (min)  45 min       Past Medical History:  Diagnosis Date  . Hypertension     Past Surgical History:  Procedure Laterality Date  . ABDOMINAL SURGERY    . CHOLECYSTECTOMY      There were no vitals filed for this visit.  Subjective Assessment - 03/27/17 1021    Subjective   Patient reports she does feel "off balance" at times and has a lot going on right now.     Pertinent History  Pt. is a 67 y.o. female who was admitted suffered an Acute Ischemic CVA on Nov. 2nd, 1018. Pt. was admitted to [facility] with left sided weakness. Pt. returned home from [facility] with her husband assist, and now is ready for outpatient services.    Currently in Pain?  No/denies    Pain Score  0-No pain                   OT Treatments/Exercises (OP) - 03/27/17 1026      Neurological Re-education Exercises   Other Exercises 1  Patient seen for left UE strengthening tasks with green theraband for grip , 3 point pinch, 2 point pinch and lateral for multiple repetitions for 2 sets each, cues as needed for technique.      Fine Motor Coordination (Hand/Wrist)   Other Fine Motor Exercises  Manipulation of small 1/2 inch sized objects picking up, placing into board and using the hand for storage.  Grooved pegboard to place and remove with occasional cues to turn item to fit into grid, removing items and moving to palm and then back to fingertips to put into container.                OT Education - 03/27/17 1619    Education provided  Yes    Education Details  fine motor coordination tasks, strength with putty    Person(s) Educated  Patient    Methods  Explanation;Demonstration;Verbal cues    Comprehension  Verbal cues required;Returned demonstration;Verbalized understanding          OT Long Term Goals - 02/18/17 0959      OT LONG TERM GOAL #1   Title  Pt. will be independent with HEP for LUE strength, and coordination skills.    Baseline  Eval: Requires assist    Time  12    Period  Weeks    Status  Partially Met      OT LONG TERM GOAL #2   Title  Pt. will increase LUE strength by 2 mm grades to be able sweep independently.    Baseline  Eval: Pt. has difficulty    Time  12    Period  Weeks    Status  Partially Met      OT LONG TERM GOAL #3   Title  Pt. will increase left grip strength by 5# to  be able to hold, and use a vacuum.    Baseline  Eval: R: 50#, L: 20#    Time  12    Period  Weeks    Status  Achieved      OT LONG TERM GOAL #4   Title  Pt. will improve left lateral pinch strength by 2# to be able to cut meat efficiently.    Baseline  Eval: R: 17#, L: 13#    Time  12    Period  Weeks    Status  Partially Met      OT LONG TERM GOAL #5   Title  Pt. will increase left 3pt. pinch strength by 2# to be able to clip nails.    Baseline  Eval:  R: 16#, L: 10#    Time  12    Period  Weeks    Status  Partially Met      OT LONG TERM GOAL #6   Title  Pt. will improve [facility] skills by 3 sec. of speed to be able to be able to ttishoes, and button efficiently.    Baseline  Eval: R: 19 sec., L: 33 sec.    Time  12    Period  Weeks    Status  Achieved            Plan - 03/27/17 1023    Clinical Impression Statement  Patient reports she has lots of things going on at home with broken appliances, repairs, etc.  She continues to work on hand strength and fine motor coordination tasks to improve functional hand use in daily tasks.  She requires occasional cues for prehension patterns, translatory movements of the hand and using the hand for storage.     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    Occupational performance deficits (Please refer to evaluation for details):  ADL's;IADL's    Rehab Potential  Excellent    OT Frequency  2x / week    OT Duration  12 weeks    OT Treatment/Interventions  Self-care/ADL training;DME and/or AE instruction;Patient/family education;Therapeutic activities;Therapeutic exercise;Neuromuscular education    Consulted and Agree with Plan of Care  Patient       Patient will benefit from skilled therapeutic intervention in order to improve the following deficits and impairments:  Abnormal gait, Pain, Impaired UE functional use, Decreased strength, Decreased coordination  Visit Diagnosis: Muscle weakness (generalized)  Other lack of coordination    Problem List Patient Active Problem List   Diagnosis Date Noted  . Insomnia 03/23/2017  . Tobacco abuse disorder 01/15/2017  . Blurry vision 01/15/2017  . Left hemiparesis ([place]) 01/06/2017  . HTN (hypertension) 01/06/2017  . CVA (cerebral vascular accident) [facility]) 01/06/2017   Angel Lane, OTR/L, CLT  Angel Lane 03/27/2017, 4:22 PM  [place] MAIN [facility] SERVICES [address] [place], [place], [address] Phone: [phone]   Fax:  [phone]  Name: Angel Lane MRN: [id number] Date of Birth: 1950/08/25

## 2017-03-29 ENCOUNTER — Ambulatory Visit: Payer: Medicare Other | Admitting: Physical Therapy

## 2017-03-29 ENCOUNTER — Ambulatory Visit: Payer: Federal, State, Local not specified - PPO | Admitting: Occupational Therapy

## 2017-03-29 ENCOUNTER — Encounter: Payer: Medicare Other | Admitting: Occupational Therapy

## 2017-03-30 ENCOUNTER — Ambulatory Visit: Payer: Medicare Other

## 2017-03-30 ENCOUNTER — Encounter: Payer: Medicare Other | Admitting: Occupational Therapy

## 2017-04-03 ENCOUNTER — Ambulatory Visit: Payer: Medicare Other | Admitting: Physical Therapy

## 2017-04-03 ENCOUNTER — Encounter: Payer: Medicare Other | Admitting: Occupational Therapy

## 2017-04-03 ENCOUNTER — Ambulatory Visit: Payer: Federal, State, Local not specified - PPO | Admitting: Speech Pathology

## 2017-04-03 ENCOUNTER — Encounter: Payer: Self-pay | Admitting: Occupational Therapy

## 2017-04-03 ENCOUNTER — Ambulatory Visit: Payer: Federal, State, Local not specified - PPO | Admitting: Occupational Therapy

## 2017-04-03 ENCOUNTER — Encounter: Payer: Medicare Other | Admitting: Speech Pathology

## 2017-04-03 DIAGNOSIS — R278 Other lack of coordination: Secondary | ICD-10-CM

## 2017-04-03 DIAGNOSIS — R41841 Cognitive communication deficit: Secondary | ICD-10-CM

## 2017-04-03 DIAGNOSIS — M6281 Muscle weakness (generalized): Secondary | ICD-10-CM

## 2017-04-03 NOTE — Therapy (Signed)
ADULT Speech Language Pathology TREATMENT - 04/03/17 1100      General Information   Behavior/Cognition  Alert;Cooperative;Pleasant mood    HPI   68 year old woman S/P right basal ganglia CVA 01/05/2017.       Treatment Provided   Treatment provided  Cognitive-Linguistic      Pain Assessment   Pain Assessment  No/denies pain      Cognitive-Linquistic Treatment   Treatment focused on  Cognition;Patient/family/caregiver education    Skilled Treatment  PATIENT EDUCATION: SLP reviewed strategies for facilitating effective use of planner, strategies for remembering names and things at home. These include marking each section of planner (monthly, weekly, notes sections), use of sticky notes. ORGANIZATION: Pt reports she has not placed homework/exercises given last session into binder. Encouraged pt to bring to next session to complete with SLP. COGNITION/MEMORY: Pt reports she has not started completing HEP, and was encouraged to do so.      Assessment / Recommendations / Plan   Plan  Continue with current plan of care      Progression Toward Goals   Progression toward goals  Progressing toward goals      Angel Lane B. Quentin Ore, [place], [place] Speech Language Pathologist

## 2017-04-04 NOTE — Therapy (Signed)
[name] MAIN [facility] SERVICES [address] [place], [place], [address] Phone: [phone]   Fax:  [phone]  Occupational Therapy Treatment  Patient Details  Name: Angel Lane MRN: [id number] Date of Birth: 12-16-1950 Referring Provider: Lavon Paganini   Encounter Date: 04/03/2017  OT End of Session - 04/04/17 1601    Visit Number  11    Number of Visits  24    Date for OT Re-Evaluation  04/13/17    OT Start Time  1015    OT Stop Time  1100    OT Time Calculation (min)  45 min    Activity Tolerance  Patient tolerated treatment well    Behavior During Therapy  [facility] for tasks assessed/performed       Past Medical History:  Diagnosis Date  . Hypertension     Past Surgical History:  Procedure Laterality Date  . ABDOMINAL SURGERY    . CHOLECYSTECTOMY      There were no vitals filed for this visit.  Subjective Assessment - 04/03/17 1020    Subjective   Patient reports she had a birthday party over the weekend.  Daughter and husband helped to put the party together.     Pertinent History  Pt. is a 67 y.o. female who was admitted suffered an Acute Ischemic CVA on Nov. 2nd, 1018. Pt. was admitted to [facility] with left sided weakness. Pt. returned home from [facility] with her husband assist, and now is ready for outpatient services.    Patient Stated Goals  To be as independent as she was before.     Currently in Pain?  No/denies    Pain Score  0-No pain                   OT Treatments/Exercises (OP) - 04/03/17 1556      Neurological Re-education Exercises   Other Exercises 1  Patient seen for manipulation of small push pins to place in moderate resistance board with left hand with cues for prehension patterns.  Assistance to push the pins in all the way.  Patient able to push pins in all the way on lower resistance board.  Patient seen for sustained Grip strength on 3rd setting of 17# for 25 reps, 4th setting of 23# for 25  repetitions completed 1 set, cues for technique and hand position on gripper with LUE.        Fine Motor Coordination (Hand/Wrist)   Other Fine Motor Exercises  Patient seen for manipulation of small washers placing onto dowels placed in vertical and diagonal planes of motion on elevated surface to encourage reach with left UE.               OT Education - 04/04/17 1601    Education provided  Yes    Education Details  fine motor coordination    Person(s) Educated  Patient    Methods  Explanation;Demonstration;Verbal cues    Comprehension  Verbal cues required;Returned demonstration;Verbalized understanding          OT Long Term Goals - 02/18/17 0959      OT LONG TERM GOAL #1   Title  Pt. will be independent with HEP for LUE strength, and coordination skills.    Baseline  Eval: Requires assist    Time  12    Period  Weeks    Status  Partially Met      OT LONG TERM GOAL #2   Title  Pt. will  increase LUE strength by 2 mm grades to be able sweep independently.    Baseline  Eval: Pt. has difficulty    Time  12    Period  Weeks    Status  Partially Met      OT LONG TERM GOAL #3   Title  Pt. will increase left grip strength by 5# to be able to hold, and use a vacuum.    Baseline  Eval: R: 50#, L: 20#    Time  12    Period  Weeks    Status  Achieved      OT LONG TERM GOAL #4   Title  Pt. will improve left lateral pinch strength by 2# to be able to cut meat efficiently.    Baseline  Eval: R: 17#, L: 13#    Time  12    Period  Weeks    Status  Partially Met      OT LONG TERM GOAL #5   Title  Pt. will increase left 3pt. pinch strength by 2# to be able to clip nails.    Baseline  Eval:  R: 16#, L: 10#    Time  12    Period  Weeks    Status  Partially Met      OT LONG TERM GOAL #6   Title  Pt. will improve [facility] skills by 3 sec. of speed to be able to be able to ttishoes, and button efficiently.    Baseline  Eval: R: 19 sec., L: 33 sec.    Time  12    Period  Weeks     Status  Achieved            Plan - 04/04/17 1602    Clinical Impression Statement  Patient continues to work towards left UE strength, ROM, coordination and functional hand use for daily tasks at home and in the community.  She demonstrates increased finger strength this date when working on push pins in moderate resistance board and able to push almost all the way in.  Continues to drop items occasionally with left hand when performing fine motor coordination skills.  Will continue to work towards goals to increase independence in daily tasks.        Patient will benefit from skilled therapeutic intervention in order to improve the following deficits and impairments:     Visit Diagnosis: Muscle weakness (generalized)  Other lack of coordination    Problem List Patient Active Problem List   Diagnosis Date Noted  . Insomnia 03/23/2017  . Tobacco abuse disorder 01/15/2017  . Blurry vision 01/15/2017  . Left hemiparesis ([place]) 01/06/2017  . HTN (hypertension) 01/06/2017  . CVA (cerebral vascular accident) [facility]) 01/06/2017   Ariadna Setter T Tomasita Morrow, OTR/L, CLT  Latorsha Curling 04/04/2017, 4:04 PM  [place] MAIN [facility] SERVICES [address] [place], [place], [address] Phone: [phone]   Fax:  [phone]  Name: Angel Lane MRN: [id number] Date of Birth: [birth year]

## 2017-04-05 ENCOUNTER — Ambulatory Visit: Payer: Federal, State, Local not specified - PPO | Admitting: Occupational Therapy

## 2017-04-05 ENCOUNTER — Encounter: Payer: Medicare Other | Admitting: Occupational Therapy

## 2017-04-05 ENCOUNTER — Ambulatory Visit: Payer: Federal, State, Local not specified - PPO | Admitting: Speech Pathology

## 2017-04-05 ENCOUNTER — Encounter: Payer: Medicare Other | Admitting: Speech Pathology

## 2017-04-05 ENCOUNTER — Ambulatory Visit: Payer: Medicare Other | Admitting: Physical Therapy

## 2017-04-10 ENCOUNTER — Encounter: Payer: Self-pay | Admitting: Speech Pathology

## 2017-04-10 ENCOUNTER — Ambulatory Visit: Payer: Medicare Other

## 2017-04-10 ENCOUNTER — Encounter: Payer: Medicare Other | Admitting: Occupational Therapy

## 2017-04-10 ENCOUNTER — Ambulatory Visit: Payer: Federal, State, Local not specified - PPO | Attending: Family Medicine | Admitting: Speech Pathology

## 2017-04-10 DIAGNOSIS — R41841 Cognitive communication deficit: Secondary | ICD-10-CM | POA: Diagnosis present

## 2017-04-10 NOTE — Therapy (Signed)
[place] MAIN [facility] SERVICES [address] [place], [place], [address] Phone: [phone]   Fax:  [phone]  Speech Language Pathology Treatment  Patient Details  Name: Angel Lane MRN: [id number] Date of Birth: 07-30-1950 Referring Provider: Virginia Crews   Encounter Date: 04/10/2017  End of Session - 04/10/17 1201    Visit Number  4    Number of Visits  9    Date for SLP Re-Evaluation  05/11/17    SLP Start Time  5    SLP Stop Time   1140    SLP Time Calculation (min)  40 min    Activity Tolerance  Patient tolerated treatment well       Past Medical History:  Diagnosis Date  . Hypertension     Past Surgical History:  Procedure Laterality Date  . ABDOMINAL SURGERY    . CHOLECYSTECTOMY      There were no vitals filed for this visit.  Subjective Assessment - 04/10/17 1159    Subjective  Pt reports she forgot her binder, but did bring her planner. Daughter Mechele Claude Omelia Blackwater) present    Patient is accompained by:  Family member    Currently in Pain?  No/denies            ADULT SLP TREATMENT - 04/10/17 0001      General Information   Behavior/Cognition  Alert;Cooperative;Pleasant mood    HPI   67 year old woman S/P right basal ganglia CVA 01/05/2017.       Treatment Provided   Treatment provided  Cognitive-Linquistic      Pain Assessment   Pain Assessment  No/denies pain      Cognitive-Linquistic Treatment   Treatment focused on  Cognition;Patient/family/caregiver education    Skilled Treatment  Pt brought planner to therapy today, however, she forgot to bring her binder with homework tasks. Pt was encouraged to bring it next time. The Cerebellar Cognitive Affective Syndrome Scale (CCAS-Scale) was administered today for diagnostic treatment. Pt passed 7/10 subtests (phonemic fluency, category switching, cube drawing, verbal recall, similarities, attention, affect), and failed 3/10 subtests (semantic  fluency, digit repetition forward and backward). Deficits in these areas indicate difficulty with thought organization, mental flexibility, and higher levels of attention. Pt indicates she will forget something, but "it always comes back". Will continue treatment targeting goals for improved functional recall for completion of responsibilities.     Assessment / Recommendations / Plan   Plan  Continue with current plan of care      Progression Toward Goals   Progression toward goals  Progressing toward goals       SLP Education - 04/10/17 1200    Education provided  Yes    Education Details  Continue use of planner to keep track of events, improve recall    Person(s) Educated  Patient;Child(ren)    Methods  Explanation;Demonstration;Verbal cues    Comprehension  Verbalized understanding;Returned demonstration         SLP Long Term Goals - 04/10/17 1210      SLP LONG TERM GOAL #1   Title  Patient will demonstrate functional cognitive-communication skills for independent completion of personal responsibilities and leisure activities.    Time  4    Period  Weeks    Status  On-going    Target Date  05/11/17      SLP LONG TERM GOAL #2   Title  Patient will complete complex attention, executive function, and memory activities with 80%  accuracy.    Time  4    Period  Weeks    Status  On-going    Target Date  05/11/17      SLP LONG TERM GOAL #3   Title  Patient will identify cognitive-communication barriers and participate in developing functional compensatory strategies.    Time  4    Period  Weeks    Status  On-going    Target Date  05/11/17      SLP LONG TERM GOAL #4   Title  Patient will generate cogent, informative verbal responses to moderately complex cognitive linguistic tasks with 80% accuracy.    Time  4    Period  Weeks    Status  On-going    Target Date  05/11/17       Plan - 04/10/17 1209    Clinical Impression Statement  Pt continues to report difficulty  with short term recall, but is using a planner to facilitate recall of events and appointments. Initial recommendation for therapy was for 4 weeks, however, Pt has attended only 4 of 9 sessions thus far. Continued ST intervention is recommended, 1-2x/week for 4 more weeks, to maximize cognitive linguistic function for improved safety and independence, as well as decreased caregiver burden.    Speech Therapy Frequency  2x / week    Treatment/Interventions  Language facilitation;Compensatory techniques;Cognitive reorganization;Internal/external aids;Patient/family education;SLP instruction and feedback    Potential to Achieve Goals  Good    Potential Considerations  Ability to learn/carryover information;Co-morbidities;Cooperation/participation level;Medical prognosis;Severity of impairments;Family/community support    SLP Home Exercise Plan  continue brainwave program, use of planner. remember binder next visit    Consulted and Agree with Plan of Care  Patient;Family member/caregiver    Family Member Consulted  daughter Mechele Claude       Patient will benefit from skilled therapeutic intervention in order to improve the following deficits and impairments:   Cognitive communication deficit    Problem List Patient Active Problem List   Diagnosis Date Noted  . Insomnia 03/23/2017  . Tobacco abuse disorder 01/15/2017  . Blurry vision 01/15/2017  . Left hemiparesis ([place]) 01/06/2017  . HTN (hypertension) 01/06/2017  . CVA (cerebral vascular accident) ([place]) 01/06/2017   Amahri Dengel B. Quentin Ore [facility], CCC-SLP Speech Language Pathologist  Shonna Chock 04/10/2017, 12:13 PM  [place] MAIN [facility] SERVICES [address] [place], [place], [address] Phone: [phone]   Fax:  [phone]   Name: KHYLIE LARMORE MRN: [id number] Date of Birth: 1950/09/25

## 2017-04-12 ENCOUNTER — Ambulatory Visit: Payer: Federal, State, Local not specified - PPO | Admitting: Speech Pathology

## 2017-04-12 ENCOUNTER — Encounter: Payer: Self-pay | Admitting: Speech Pathology

## 2017-04-12 ENCOUNTER — Ambulatory Visit: Payer: Federal, State, Local not specified - PPO | Admitting: Occupational Therapy

## 2017-04-12 ENCOUNTER — Ambulatory Visit: Payer: Medicare Other | Admitting: Physical Therapy

## 2017-04-12 DIAGNOSIS — R41841 Cognitive communication deficit: Secondary | ICD-10-CM

## 2017-04-12 NOTE — Therapy (Signed)
[place] MAIN [facility] SERVICES [address] [place], [place], [address] Phone: [phone]   Fax:  [phone]  Speech Language Pathology Treatment  Patient Details  Name: Angel Lane MRN: [id number] Date of Birth: 23-Jan-1951 Referring Provider: Virginia Crews   Encounter Date: 04/12/2017  End of Session - 04/12/17 1113    Visit Number  5    Number of Visits  9    Date for SLP Re-Evaluation  05/11/17    SLP Start Time  1000    SLP Stop Time   1055    SLP Time Calculation (min)  55 min    Activity Tolerance  Patient tolerated treatment well       Past Medical History:  Diagnosis Date  . Hypertension     Past Surgical History:  Procedure Laterality Date  . ABDOMINAL SURGERY    . CHOLECYSTECTOMY      There were no vitals filed for this visit.  Subjective Assessment - 04/12/17 1022    Subjective  I"m stressed today    Currently in Pain?  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       Pain Assessment   Pain Assessment  No/denies pain      Cognitive-Linquistic Treatment   Treatment focused on  Cognition;Patient/family/caregiver education    Skilled Treatment   Pt seen alone this morning. Her daughter remained in the lobby. Pt indicated she was stressed today - dealing with the car, etc. Moderate verbal and organizational cues were required for completion of deductive reasoning #1. Min cues with organization task for garden plot. Puzzle #2 given for homework. Pt forgot binder again today, and was encouraged to bring it next visit.  Pt using monthly view of calendar rather than weekly view. Monthly calendar was noted to be disorganized, with crossed out entries and confusing data ([place] appointment noted on a Sunday). SLP encouraged use of pencil, and use of  weekly view to keep track of her multiple appoinrments.      Assessment / Recommendations / Plan   Plan  Continue with current plan of care      Progression Toward Goals   Progression toward goals  Progressing toward goals       SLP Education - 04/12/17 1025    Education provided  Yes    Education Details  Use planner - weekly view, rather than monthly, bring binder next visit, complete ded reas #2    Person(s) Educated  Patient    Methods  Explanation;Demonstration;Verbal cues;Handout    Comprehension  Verbalized understanding;Returned demonstration;Need further instruction;Verbal cues required         SLP Long Term Goals - 04/12/17 1115      SLP LONG TERM GOAL #1   Title  Patient will demonstrate functional cognitive-communication skills for independent completion of personal responsibilities and leisure activities.    Time  4    Period  Weeks    Status  On-going      SLP LONG TERM GOAL #2   Title  Patient will complete complex attention, executive function, and memory activities with 80% accuracy.    Time  4    Period  Weeks    Status  On-going      SLP  LONG TERM GOAL #3   Title  Patient will identify cognitive-communication barriers and participate in developing functional compensatory strategies.    Time  4    Period  Weeks    Status  On-going      SLP LONG TERM GOAL #4   Title  Patient will generate cogent, informative verbal responses to moderately complex cognitive linguistic tasks with 80% accuracy.    Time  4    Period  Weeks    Status  On-going       Plan - 04/12/17 1113    Clinical Impression Statement  Pt reports being "stressed out" today, with so much going on. Mod cues required for thought organization/reasoning puzzle. Continued ST intervention is recommended to maximize cognitive linguistic function.     Speech Therapy Frequency  2x / week    Duration  4 weeks    Treatment/Interventions  Language facilitation;Compensatory techniques;Cognitive  reorganization;Internal/external aids;Patient/family education;SLP instruction and feedback    Potential to Achieve Goals  Good    Potential Considerations  Ability to learn/carryover information;Co-morbidities;Cooperation/participation level;Medical prognosis;Severity of impairments;Family/community support    SLP Home Exercise Plan  deductive reasoning puzzle #2, continue brainwave program, use of planner. remember binder next visit    Consulted and Agree with Plan of Care  Patient       Patient will benefit from skilled therapeutic intervention in order to improve the following deficits and impairments:   Cognitive communication deficit    Problem List Patient Active Problem List   Diagnosis Date Noted  . Insomnia 03/23/2017  . Tobacco abuse disorder 01/15/2017  . Blurry vision 01/15/2017  . Left hemiparesis ([place]) 01/06/2017  . HTN (hypertension) 01/06/2017  . CVA (cerebral vascular accident) ([place]) 01/06/2017   Xayla Puzio B. Quentin Ore [facility], CCC-SLP Speech Language Pathologist  Shonna Chock 04/12/2017, 11:17 AM  [place] MAIN [facility] SERVICES [address] [place], [place], [address] Phone: [phone]   Fax:  [phone]   Name: Angel Lane MRN: [id number] Date of Birth: 01-22-1951

## 2017-04-16 ENCOUNTER — Other Ambulatory Visit: Payer: Self-pay | Admitting: Family Medicine

## 2017-04-16 MED ORDER — TRAZODONE HCL 50 MG PO TABS: 25.0000 mg | ORAL_TABLET | Freq: Every evening | ORAL | 3 refills | Status: DC | PRN

## 2017-04-16 NOTE — Telephone Encounter (Signed)
[facility] faxed a refill request for a 90-days supply for the following medication. Thanks CC ° °traZODone (DESYREL) 50 MG tablet  ° °

## 2017-04-17 ENCOUNTER — Encounter: Payer: Medicare Other | Admitting: Occupational Therapy

## 2017-04-17 ENCOUNTER — Ambulatory Visit: Payer: Medicare Other

## 2017-04-19 ENCOUNTER — Ambulatory Visit: Payer: Medicare Other | Admitting: Physical Therapy

## 2017-04-19 ENCOUNTER — Ambulatory Visit: Payer: Federal, State, Local not specified - PPO | Admitting: Occupational Therapy

## 2017-04-19 ENCOUNTER — Encounter: Payer: Medicare Other | Admitting: Speech Pathology

## 2017-04-24 ENCOUNTER — Ambulatory Visit: Payer: Medicare Other | Admitting: Physical Therapy

## 2017-04-24 ENCOUNTER — Ambulatory Visit: Payer: Federal, State, Local not specified - PPO

## 2017-04-24 ENCOUNTER — Encounter: Payer: Medicare Other | Admitting: Occupational Therapy

## 2017-04-24 ENCOUNTER — Ambulatory Visit: Payer: Federal, State, Local not specified - PPO | Admitting: Occupational Therapy

## 2017-04-25 ENCOUNTER — Telehealth: Payer: Self-pay | Admitting: Family Medicine

## 2017-04-25 NOTE — Telephone Encounter (Signed)
[facility] faxed a refill request for a 90-days supply for the following medication. Thanks CC ° °traZODone (DESYREL) 50 MG tablet  ° °

## 2017-04-25 NOTE — Telephone Encounter (Signed)
Last refill sent 04/16/2017, but was #30 with 3 refills.

## 2017-04-26 ENCOUNTER — Ambulatory Visit: Payer: Medicare Other | Admitting: Physical Therapy

## 2017-04-26 ENCOUNTER — Encounter: Payer: Medicare Other | Admitting: Occupational Therapy

## 2017-04-26 MED ORDER — TRAZODONE HCL 50 MG PO TABS: 25.0000 mg | ORAL_TABLET | Freq: Every evening | ORAL | 3 refills | Status: DC | PRN

## 2017-04-26 NOTE — Telephone Encounter (Signed)
OK. 90 day supply with refills sent.  Virginia Crews, MD, MPH [facility] 04/26/2017 8:24 AM

## 2017-05-01 ENCOUNTER — Ambulatory Visit: Payer: Medicare Other | Admitting: Physical Therapy

## 2017-05-01 ENCOUNTER — Encounter: Payer: Medicare Other | Admitting: Occupational Therapy

## 2017-05-03 ENCOUNTER — Ambulatory Visit: Payer: Federal, State, Local not specified - PPO | Admitting: Occupational Therapy

## 2017-05-03 ENCOUNTER — Ambulatory Visit: Payer: Federal, State, Local not specified - PPO

## 2017-05-03 ENCOUNTER — Ambulatory Visit: Payer: Medicare Other | Admitting: Physical Therapy

## 2017-05-08 ENCOUNTER — Ambulatory Visit: Payer: Federal, State, Local not specified - PPO | Admitting: Occupational Therapy

## 2017-05-14 NOTE — Discharge Instructions (Signed)

## 2017-05-15 ENCOUNTER — Ambulatory Visit: Payer: Federal, State, Local not specified - PPO | Admitting: Occupational Therapy

## 2017-05-16 ENCOUNTER — Ambulatory Visit: Payer: Federal, State, Local not specified - PPO | Admitting: Anesthesiology

## 2017-05-16 ENCOUNTER — Encounter
Admission: RE | Disposition: A | Discharge status: Home / Self Care | Payer: Self-pay | Source: Ambulatory Visit | Attending: Ophthalmology

## 2017-05-16 ENCOUNTER — Ambulatory Visit
Admission: RE | Admit: 2017-05-16 | Discharge: 2017-05-16 | Disposition: A | Discharge status: Home / Self Care | Payer: Federal, State, Local not specified - PPO | Source: Ambulatory Visit | Attending: Ophthalmology | Admitting: Ophthalmology

## 2017-05-16 DIAGNOSIS — I10 Essential (primary) hypertension: Secondary | ICD-10-CM | POA: Diagnosis not present

## 2017-05-16 DIAGNOSIS — Z880 Allergy status to penicillin: Secondary | ICD-10-CM | POA: Insufficient documentation

## 2017-05-16 DIAGNOSIS — G4730 Sleep apnea, unspecified: Secondary | ICD-10-CM | POA: Insufficient documentation

## 2017-05-16 DIAGNOSIS — E7800 Pure hypercholesterolemia, unspecified: Secondary | ICD-10-CM | POA: Insufficient documentation

## 2017-05-16 DIAGNOSIS — H2512 Age-related nuclear cataract, left eye: Secondary | ICD-10-CM | POA: Diagnosis present

## 2017-05-16 DIAGNOSIS — Z8673 Personal history of transient ischemic attack (TIA), and cerebral infarction without residual deficits: Secondary | ICD-10-CM | POA: Diagnosis not present

## 2017-05-16 DIAGNOSIS — Z9049 Acquired absence of other specified parts of digestive tract: Secondary | ICD-10-CM | POA: Insufficient documentation

## 2017-05-16 HISTORY — DX: Myoneural disorder, unspecified: G70.9

## 2017-05-16 HISTORY — DX: Cerebral infarction, unspecified: I63.9

## 2017-05-16 HISTORY — DX: Sleep apnea, unspecified: G47.30

## 2017-05-16 HISTORY — PX: CATARACT EXTRACTION W/PHACO: SHX586

## 2017-05-16 HISTORY — DX: Dyspnea, unspecified: R06.00

## 2017-05-16 SURGERY — PHACOEMULSIFICATION, CATARACT, WITH IOL INSERTION
Anesthesia: Monitor Anesthesia Care | Site: Eye | Laterality: Left | Wound class: Clean

## 2017-05-16 MED ORDER — MOXIFLOXACIN HCL 0.5 % OP SOLN
1.0000 [drp] | OPHTHALMIC | Status: DC | PRN
  Administered 2017-05-16 (×3): 1 [drp] via OPHTHALMIC

## 2017-05-16 MED ORDER — BRIMONIDINE TARTRATE-TIMOLOL 0.2-0.5 % OP SOLN
OPHTHALMIC | Status: DC | PRN
  Administered 2017-05-16: 1 [drp] via OPHTHALMIC

## 2017-05-16 MED ORDER — LACTATED RINGERS IV SOLN: INTRAVENOUS | Status: DC

## 2017-05-16 MED ORDER — ACETAMINOPHEN 160 MG/5ML PO SOLN: 325.0000 mg | ORAL | Status: DC | PRN

## 2017-05-16 MED ORDER — EPINEPHRINE PF 1 MG/ML IJ SOLN
INTRAOCULAR | Status: DC | PRN
  Administered 2017-05-16: 54 mL via OPHTHALMIC

## 2017-05-16 MED ORDER — FENTANYL CITRATE (PF) 100 MCG/2ML IJ SOLN
INTRAMUSCULAR | Status: DC | PRN
  Administered 2017-05-16 (×2): 50 ug via INTRAVENOUS

## 2017-05-16 MED ORDER — LIDOCAINE HCL (PF) 2 % IJ SOLN
INTRAOCULAR | Status: DC | PRN
  Administered 2017-05-16: 1 mL

## 2017-05-16 MED ORDER — CEFUROXIME OPHTHALMIC INJECTION 1 MG/0.1 ML
INJECTION | OPHTHALMIC | Status: DC | PRN
  Administered 2017-05-16: 0.1 mL via INTRACAMERAL

## 2017-05-16 MED ORDER — ACETAMINOPHEN 325 MG PO TABS: 650.0000 mg | ORAL_TABLET | Freq: Once | ORAL | Status: DC | PRN

## 2017-05-16 MED ORDER — MIDAZOLAM HCL 2 MG/2ML IJ SOLN
INTRAMUSCULAR | Status: DC | PRN
  Administered 2017-05-16 (×2): 1 mg via INTRAVENOUS

## 2017-05-16 MED ORDER — ARMC OPHTHALMIC DILATING DROPS
1.0000 "application " | OPHTHALMIC | Status: DC | PRN
  Administered 2017-05-16 (×3): 1 via OPHTHALMIC

## 2017-05-16 MED ORDER — ONDANSETRON HCL 4 MG/2ML IJ SOLN: 4.0000 mg | Freq: Once | INTRAMUSCULAR | Status: DC | PRN

## 2017-05-16 MED ORDER — NA HYALUR & NA CHOND-NA HYALUR 0.4-0.35 ML IO KIT
PACK | INTRAOCULAR | Status: DC | PRN
  Administered 2017-05-16: 1 mL via INTRAOCULAR

## 2017-05-16 SURGICAL SUPPLY — 27 items
CANNULA ANT/CHMB 27G (MISCELLANEOUS) ×1 IMPLANT
CANNULA ANT/CHMB 27GA (MISCELLANEOUS) ×2 IMPLANT
CARTRIDGE ABBOTT (MISCELLANEOUS) IMPLANT
GLOVE SURG LX 7.5 STRW (GLOVE) ×2
GLOVE SURG LX STRL 7.5 STRW (GLOVE) ×1 IMPLANT
GLOVE SURG TRIUMPH 8.0 PF LTX (GLOVE) ×2 IMPLANT
GOWN STRL REUS W/ TWL LRG LVL3 (GOWN DISPOSABLE) ×2 IMPLANT
GOWN STRL REUS W/TWL LRG LVL3 (GOWN DISPOSABLE) ×4
LENS IOL TECNIS ITEC 18.5 (Intraocular Lens) ×1 IMPLANT
MARKER SKIN DUAL TIP RULER LAB (MISCELLANEOUS) ×2 IMPLANT
NDL FILTER BLUNT 18X1 1/2 (NEEDLE) ×1 IMPLANT
NDL RETROBULBAR .5 NSTRL (NEEDLE) IMPLANT
NEEDLE FILTER BLUNT 18X 1/2SAF (NEEDLE) ×1
NEEDLE FILTER BLUNT 18X1 1/2 (NEEDLE) ×1 IMPLANT
PACK CATARACT BRASINGTON (MISCELLANEOUS) ×2 IMPLANT
PACK EYE AFTER SURG (MISCELLANEOUS) ×2 IMPLANT
PACK OPTHALMIC (MISCELLANEOUS) ×2 IMPLANT
RING MALYGIN 7.0 (MISCELLANEOUS) IMPLANT
SUT ETHILON 10-0 CS-B-6CS-B-6 (SUTURE)
SUT VICRYL  9 0 (SUTURE)
SUT VICRYL 9 0 (SUTURE) IMPLANT
SUTURE EHLN 10-0 CS-B-6CS-B-6 (SUTURE) IMPLANT
SYR 3ML LL SCALE MARK (SYRINGE) ×2 IMPLANT
SYR 5ML LL (SYRINGE) ×2 IMPLANT
SYR TB 1ML LUER SLIP (SYRINGE) ×2 IMPLANT
WATER STERILE IRR 500ML POUR (IV SOLUTION) ×2 IMPLANT
WIPE NON LINTING 3.25X3.25 (MISCELLANEOUS) ×2 IMPLANT

## 2017-05-16 NOTE — Anesthesia Preprocedure Evaluation (Signed)
Anesthesia Evaluation  Patient identified by MRN, date of birth, ID band Patient awake    Reviewed: Allergy & Precautions, NPO status , Patient's Chart, lab work & pertinent test results  History of Anesthesia Complications Negative for: history of anesthetic complications  Airway Mallampati: II  TM Distance: >3 FB Neck ROM: Full    Dental  (+)    Pulmonary sleep apnea , Current Smoker (1 ppd),    Pulmonary exam normal breath sounds clear to auscultation       Cardiovascular Exercise Tolerance: Good hypertension, Normal cardiovascular exam Rhythm:Regular Rate:Normal     Neuro/Psych CVA (01/2017; residual left-sided weakness)    GI/Hepatic negative GI ROS,   Endo/Other  negative endocrine ROS  Renal/GU negative Renal ROS     Musculoskeletal   Abdominal   Peds  Hematology negative hematology ROS (+)   Anesthesia Other Findings   Reproductive/Obstetrics                             Anesthesia Physical Anesthesia Plan  ASA: III  Anesthesia Plan: MAC   Post-op Pain Management:    Induction: Intravenous  PONV Risk Score and Plan: 1 and TIVA and Midazolam  Airway Management Planned: Natural Airway  Additional Equipment:   Intra-op Plan:   Post-operative Plan:   Informed Consent: I have reviewed the patients History and Physical, chart, labs and discussed the procedure including the risks, benefits and alternatives for the proposed anesthesia with the patient or authorized representative who has indicated his/her understanding and acceptance.     Plan Discussed with: CRNA  Anesthesia Plan Comments:         Anesthesia Quick Evaluation

## 2017-05-16 NOTE — Anesthesia Procedure Notes (Signed)
Procedure Name: MAC Date/Time: 05/16/2017 10:07 AM Performed by: Cameron Ali, CRNA Pre-anesthesia Checklist: Patient identified, Emergency Drugs available, Suction available, Timeout performed and Patient being monitored Patient Re-evaluated:Patient Re-evaluated prior to induction Oxygen Delivery Method: Nasal cannula Placement Confirmation: positive ETCO2

## 2017-05-16 NOTE — Anesthesia Postprocedure Evaluation (Signed)
Anesthesia Post Note  Patient: Angel Lane  Procedure(s) Performed: CATARACT EXTRACTION PHACO AND INTRAOCULAR LENS PLACEMENT ([place]) LEFT (Left Eye)  Patient location during evaluation: PACU Anesthesia Type: MAC Level of consciousness: awake and alert, oriented and patient cooperative Pain management: pain level controlled Vital Signs Assessment: post-procedure vital signs reviewed and stable Respiratory status: spontaneous breathing, nonlabored ventilation and respiratory function stable Cardiovascular status: blood pressure returned to baseline and stable Postop Assessment: adequate PO intake Anesthetic complications: no    Darrin Nipper

## 2017-05-16 NOTE — Op Note (Signed)
OPERATIVE NOTE  Angel Lane [id number] 05/16/2017   PREOPERATIVE DIAGNOSIS:  Nuclear sclerotic cataract left eye. H25.12   POSTOPERATIVE DIAGNOSIS:    Nuclear sclerotic cataract left eye.     PROCEDURE:  Phacoemusification with posterior chamber intraocular lens placement of the left eye   LENS:   Implant Name Type Inv. Item Serial No. Manufacturer Lot No. LRB No. Used  LENS IOL DIOP 18.5 - [id number] Intraocular Lens LENS IOL DIOP 18.5 [id number]  Left 1        ULTRASOUND TIME: 11  % of 0 minutes 45 seconds, CDE 5.0  SURGEON:  Wyonia Hough, MD   ANESTHESIA:  Topical with tetracaine drops and 2% Xylocaine jelly, augmented with 1% preservative-free intracameral lidocaine.    COMPLICATIONS:  None.   DESCRIPTION OF PROCEDURE:  The patient was identified in the holding room and transported to the operating room and placed in the supine position under the operating microscope.  The left eye was identified as the operative eye and it was prepped and draped in the usual sterile ophthalmic fashion.   A 1 millimeter clear-corneal paracentesis was made at the 1:30 position.  0.5 ml of preservative-free 1% lidocaine was injected into the anterior chamber.  The anterior chamber was filled with Viscoat viscoelastic.  A 2.4 millimeter keratome was used to make a near-clear corneal incision at the 10:30 position.  .  A curvilinear capsulorrhexis was made with a cystotome and capsulorrhexis forceps.  Balanced salt solution was used to hydrodissect and hydrodelineate the nucleus.   Phacoemulsification was then used in stop and chop fashion to remove the lens nucleus and epinucleus.  The remaining cortex was then removed using the irrigation and aspiration handpiece. Provisc was then placed into the capsular bag to distend it for lens placement.  A lens was then injected into the capsular bag.  The remaining viscoelastic was aspirated.   Wounds were hydrated with balanced salt  solution.  The anterior chamber was inflated to a physiologic pressure with balanced salt solution.  No wound leaks were noted. Cefuroxime 0.1 ml of a 10mg /ml solution was injected into the anterior chamber for a dose of 1 mg of intracameral antibiotic at the completion of the case.   Timolol and Brimonidine drops were applied to the eye.  The patient was taken to the recovery room in stable condition without complications of anesthesia or surgery.  Angel Lane 05/16/2017, 10:23 AM

## 2017-05-16 NOTE — H&P (Signed)
The History and Physical notes are on paper, have been signed, and are to be scanned. The patient remains stable and unchanged from the H&P.   Previous H&P reviewed, patient examined, and there are no changes.  Angel Lane 05/16/2017 9:35 AM

## 2017-05-16 NOTE — Transfer of Care (Signed)
Immediate Anesthesia Transfer of Care Note  Patient: Angel Lane  Procedure(s) Performed: CATARACT EXTRACTION PHACO AND INTRAOCULAR LENS PLACEMENT ([facility]) LEFT (Left Eye)  Patient Location: PACU  Anesthesia Type: MAC  Level of Consciousness: awake, alert  and patient cooperative  Airway and Oxygen Therapy: Patient Spontanous Breathing and Patient connected to supplemental oxygen  Post-op Assessment: Post-op Vital signs reviewed, Patient's Cardiovascular Status Stable, Respiratory Function Stable, Patent Airway and No signs of Nausea or vomiting  Post-op Vital Signs: Reviewed and stable  Complications: No apparent anesthesia complications

## 2017-05-16 NOTE — Progress Notes (Signed)
Received patient from OR arousable to verbal stimuli, initially requiring arousing to maintain respirations.  Patient now awake and alert, following commands appropriately.  Speech slightly slurred, Dr. Adrian Saran aware and in to see patient.  MAE, patient reports feeling at baseline. Will continue to monitor.

## 2017-05-22 ENCOUNTER — Ambulatory Visit: Payer: Federal, State, Local not specified - PPO | Admitting: Occupational Therapy

## 2017-05-24 ENCOUNTER — Encounter: Payer: Medicare Other | Admitting: Occupational Therapy

## 2017-05-29 ENCOUNTER — Ambulatory Visit: Payer: Federal, State, Local not specified - PPO | Admitting: Occupational Therapy

## 2017-06-06 NOTE — Discharge Instructions (Signed)

## 2017-06-07 ENCOUNTER — Encounter: Payer: Self-pay | Admitting: *Deleted

## 2017-06-07 ENCOUNTER — Other Ambulatory Visit: Payer: Self-pay

## 2017-06-13 ENCOUNTER — Ambulatory Visit: Payer: Federal, State, Local not specified - PPO | Admitting: Anesthesiology

## 2017-06-13 ENCOUNTER — Encounter
Admission: RE | Disposition: A | Discharge status: Home / Self Care | Payer: Self-pay | Source: Ambulatory Visit | Attending: Ophthalmology

## 2017-06-13 ENCOUNTER — Ambulatory Visit
Admission: RE | Admit: 2017-06-13 | Discharge: 2017-06-13 | Disposition: A | Discharge status: Home / Self Care | Payer: Federal, State, Local not specified - PPO | Source: Ambulatory Visit | Attending: Ophthalmology | Admitting: Ophthalmology

## 2017-06-13 DIAGNOSIS — I69354 Hemiplegia and hemiparesis following cerebral infarction affecting left non-dominant side: Secondary | ICD-10-CM | POA: Insufficient documentation

## 2017-06-13 DIAGNOSIS — G4730 Sleep apnea, unspecified: Secondary | ICD-10-CM | POA: Diagnosis not present

## 2017-06-13 DIAGNOSIS — H2511 Age-related nuclear cataract, right eye: Secondary | ICD-10-CM | POA: Diagnosis present

## 2017-06-13 DIAGNOSIS — F17200 Nicotine dependence, unspecified, uncomplicated: Secondary | ICD-10-CM | POA: Insufficient documentation

## 2017-06-13 DIAGNOSIS — I10 Essential (primary) hypertension: Secondary | ICD-10-CM | POA: Insufficient documentation

## 2017-06-13 HISTORY — PX: CATARACT EXTRACTION W/PHACO: SHX586

## 2017-06-13 SURGERY — PHACOEMULSIFICATION, CATARACT, WITH IOL INSERTION
Anesthesia: Monitor Anesthesia Care | Laterality: Right | Wound class: Clean

## 2017-06-13 MED ORDER — NA HYALUR & NA CHOND-NA HYALUR 0.4-0.35 ML IO KIT
PACK | INTRAOCULAR | Status: DC | PRN
  Administered 2017-06-13: 1 mL via INTRAOCULAR

## 2017-06-13 MED ORDER — OXYCODONE HCL 5 MG PO TABS: 5.0000 mg | ORAL_TABLET | Freq: Once | ORAL | Status: DC | PRN

## 2017-06-13 MED ORDER — MOXIFLOXACIN HCL 0.5 % OP SOLN
1.0000 [drp] | OPHTHALMIC | Status: DC | PRN
  Administered 2017-06-13 (×3): 1 [drp] via OPHTHALMIC

## 2017-06-13 MED ORDER — MIDAZOLAM HCL 2 MG/2ML IJ SOLN
INTRAMUSCULAR | Status: DC | PRN
  Administered 2017-06-13: 2 mg via INTRAVENOUS

## 2017-06-13 MED ORDER — CEFUROXIME OPHTHALMIC INJECTION 1 MG/0.1 ML
INJECTION | OPHTHALMIC | Status: DC | PRN
  Administered 2017-06-13: 0.1 mL via INTRACAMERAL

## 2017-06-13 MED ORDER — LACTATED RINGERS IV SOLN: 10.0000 mL/h | INTRAVENOUS | Status: DC

## 2017-06-13 MED ORDER — FENTANYL CITRATE (PF) 100 MCG/2ML IJ SOLN
INTRAMUSCULAR | Status: DC | PRN
  Administered 2017-06-13 (×2): 50 ug via INTRAVENOUS

## 2017-06-13 MED ORDER — EPINEPHRINE PF 1 MG/ML IJ SOLN
INTRAOCULAR | Status: DC | PRN
  Administered 2017-06-13: 49 mL via OPHTHALMIC

## 2017-06-13 MED ORDER — ARMC OPHTHALMIC DILATING DROPS
1.0000 "application " | OPHTHALMIC | Status: DC | PRN
  Administered 2017-06-13 (×3): 1 via OPHTHALMIC

## 2017-06-13 MED ORDER — LIDOCAINE HCL (PF) 2 % IJ SOLN
INTRAOCULAR | Status: DC | PRN
  Administered 2017-06-13: 1 mL via INTRAMUSCULAR

## 2017-06-13 MED ORDER — OXYCODONE HCL 5 MG/5ML PO SOLN: 5.0000 mg | Freq: Once | ORAL | Status: DC | PRN

## 2017-06-13 SURGICAL SUPPLY — 27 items

## 2017-06-13 NOTE — Transfer of Care (Signed)
Immediate Anesthesia Transfer of Care Note  Patient: Angel Lane  Procedure(s) Performed: CATARACT EXTRACTION PHACO AND INTRAOCULAR LENS PLACEMENT ([facility]) RIGHT (Right )  Patient Location: PACU  Anesthesia Type: MAC  Level of Consciousness: awake, alert  and patient cooperative  Airway and Oxygen Therapy: Patient Spontanous Breathing and Patient connected to supplemental oxygen  Post-op Assessment: Post-op Vital signs reviewed, Patient's Cardiovascular Status Stable, Respiratory Function Stable, Patent Airway and No signs of Nausea or vomiting  Post-op Vital Signs: Reviewed and stable  Complications: No apparent anesthesia complications

## 2017-06-13 NOTE — Anesthesia Procedure Notes (Signed)
Procedure Name: MAC Performed by: Makylee Sanborn, CRNA Pre-anesthesia Checklist: Patient identified, Emergency Drugs available, Suction available, Timeout performed and Patient being monitored Patient Re-evaluated:Patient Re-evaluated prior to induction Oxygen Delivery Method: Nasal cannula Placement Confirmation: positive ETCO2       

## 2017-06-13 NOTE — Op Note (Signed)
LOCATION:  [facility]   PREOPERATIVE DIAGNOSIS:    Nuclear sclerotic cataract right eye. H25.11   POSTOPERATIVE DIAGNOSIS:  Nuclear sclerotic cataract right eye.     PROCEDURE:  Phacoemusification with posterior chamber intraocular lens placement of the right eye   LENS:   Implant Name Type Inv. Item Serial No. Manufacturer Lot No. LRB No. Used  LENS IOL DIOP 18.5 - [id number] Intraocular Lens LENS IOL DIOP 18.5 [id number]  Right 1        ULTRASOUND TIME: 15 % of 0 minutes, 29 seconds.  CDE 4.3   SURGEON:  Wyonia Hough, MD   ANESTHESIA:  Topical with tetracaine drops and 2% Xylocaine jelly, augmented with 1% preservative-free intracameral lidocaine.    COMPLICATIONS:  None.   DESCRIPTION OF PROCEDURE:  The patient was identified in the holding room and transported to the operating room and placed in the supine position under the operating microscope.  The right eye was identified as the operative eye and it was prepped and draped in the usual sterile ophthalmic fashion.   A 1 millimeter clear-corneal paracentesis was made at the 12:00 position.  0.5 ml of preservative-free 1% lidocaine was injected into the anterior chamber. The anterior chamber was filled with Viscoat viscoelastic.  A 2.4 millimeter keratome was used to make a near-clear corneal incision at the 9:00 position.  A curvilinear capsulorrhexis was made with a cystotome and capsulorrhexis forceps.  Balanced salt solution was used to hydrodissect and hydrodelineate the nucleus.   Phacoemulsification was then used in stop and chop fashion to remove the lens nucleus and epinucleus.  The remaining cortex was then removed using the irrigation and aspiration handpiece. Provisc was then placed into the capsular bag to distend it for lens placement.  A lens was then injected into the capsular bag.  The remaining viscoelastic was aspirated.   Wounds were hydrated with balanced salt solution.  The anterior  chamber was inflated to a physiologic pressure with balanced salt solution.  No wound leaks were noted. Cefuroxime 0.1 ml of a 10mg /ml solution was injected into the anterior chamber for a dose of 1 mg of intracameral antibiotic at the completion of the case.   Timolol and Brimonidine drops were applied to the eye.  The patient was taken to the recovery room in stable condition without complications of anesthesia or surgery.   Jarmel Linhardt 06/13/2017, 8:56 AM

## 2017-06-13 NOTE — Anesthesia Preprocedure Evaluation (Signed)
Anesthesia Evaluation  Patient identified by MRN, date of birth, ID band  Reviewed: NPO status   History of Anesthesia Complications Negative for: history of anesthetic complications  Airway Mallampati: II  TM Distance: >3 FB Neck ROM: full    Dental  (+) Partial Upper   Pulmonary sleep apnea and Continuous Positive Airway Pressure Ventilation , Current Smoker,    Pulmonary exam normal        Cardiovascular Exercise Tolerance: Good hypertension, Normal cardiovascular exam  echo: 01/2017: - Left ventricle: The cavity size was normal. Systolic function was   normal. The estimated ejection fraction was in the range of 60%   to 65%. Wall motion was normal; there were no regional wall   motion abnormalities. Doppler parameters are consistent with   abnormal left ventricular relaxation (grade 1 diastolic   dysfunction). - Aortic valve: There was mild to moderate regurgitation. - Left atrium: The atrium was normal in size. - Right ventricle: Systolic function was normal. - Pulmonary arteries: Systolic pressure was within the normal   Range.  cards stable: 04/2017: dr. Clayborn Bigness;   Neuro/Psych Anxiety CVA (Right basal ganglia infarct causing left sided weakness: 01/2017;) negative psych ROS   GI/Hepatic negative GI ROS, Neg liver ROS,   Endo/Other  negative endocrine ROS  Renal/GU negative Renal ROS  negative genitourinary   Musculoskeletal   Abdominal   Peds  Hematology negative hematology ROS (+)   Anesthesia Other Findings   Reproductive/Obstetrics                             Anesthesia Physical Anesthesia Plan  ASA: II  Anesthesia Plan: MAC   Post-op Pain Management:    Induction:   PONV Risk Score and Plan:   Airway Management Planned:   Additional Equipment:   Intra-op Plan:   Post-operative Plan:   Informed Consent: I have reviewed the patients History and Physical,  chart, labs and discussed the procedure including the risks, benefits and alternatives for the proposed anesthesia with the patient or authorized representative who has indicated his/her understanding and acceptance.     Plan Discussed with: CRNA  Anesthesia Plan Comments:         Anesthesia Quick Evaluation

## 2017-06-13 NOTE — H&P (Signed)
The History and Physical notes are on paper, have been signed, and are to be scanned. The patient remains stable and unchanged from the H&P.   Previous H&P reviewed, patient examined, and there are no changes.  Angel Lane 06/13/2017 8:07 AM

## 2017-06-13 NOTE — Anesthesia Postprocedure Evaluation (Signed)
Anesthesia Post Note  Patient: Angel Lane  Procedure(s) Performed: CATARACT EXTRACTION PHACO AND INTRAOCULAR LENS PLACEMENT (IOC) RIGHT (Right )  Patient location during evaluation: PACU Anesthesia Type: MAC Level of consciousness: awake and alert Pain management: pain level controlled Vital Signs Assessment: post-procedure vital signs reviewed and stable Respiratory status: spontaneous breathing, nonlabored ventilation, respiratory function stable and patient connected to nasal cannula oxygen Cardiovascular status: stable and blood pressure returned to baseline Postop Assessment: no apparent nausea or vomiting Anesthetic complications: no    Arshawn Valdez

## 2017-06-14 ENCOUNTER — Encounter: Payer: Self-pay | Admitting: Ophthalmology

## 2017-06-21 ENCOUNTER — Encounter: Payer: Self-pay | Admitting: Family Medicine

## 2017-06-21 ENCOUNTER — Ambulatory Visit (INDEPENDENT_AMBULATORY_CARE_PROVIDER_SITE_OTHER): Payer: Federal, State, Local not specified - PPO | Admitting: Family Medicine

## 2017-06-21 VITALS — BP 130/88 | HR 80 | Temp 98.0°F | Resp 16 | Wt 184.0 lb

## 2017-06-21 DIAGNOSIS — B372 Candidiasis of skin and nail: Secondary | ICD-10-CM

## 2017-06-21 DIAGNOSIS — B070 Plantar wart: Secondary | ICD-10-CM | POA: Diagnosis not present

## 2017-06-21 DIAGNOSIS — L989 Disorder of the skin and subcutaneous tissue, unspecified: Secondary | ICD-10-CM | POA: Diagnosis not present

## 2017-06-21 DIAGNOSIS — I10 Essential (primary) hypertension: Secondary | ICD-10-CM

## 2017-06-21 DIAGNOSIS — L409 Psoriasis, unspecified: Secondary | ICD-10-CM

## 2017-06-21 DIAGNOSIS — G4700 Insomnia, unspecified: Secondary | ICD-10-CM | POA: Diagnosis not present

## 2017-06-21 MED ORDER — CLOBETASOL PROPIONATE EMULSION 0.05 % EX FOAM: CUTANEOUS | 1 refills | Status: DC

## 2017-06-21 MED ORDER — NYSTATIN 100000 UNIT/GM EX CREA: 1.0000 "application " | TOPICAL_CREAM | Freq: Two times a day (BID) | CUTANEOUS | 1 refills | Status: DC

## 2017-06-21 NOTE — Assessment & Plan Note (Signed)
Well-controlled Continue current medications Recent BMP reviewed Follow-up in 6 months

## 2017-06-21 NOTE — Patient Instructions (Signed)
Nystatin is for armpits and groin Clobetasol foam is for scalp   Intertrigo Intertrigo is skin irritation or inflammation (dermatitis) that occurs when folds of skin rub together. The irritation can cause a rash and make skin raw and itchy. This condition most commonly occurs in the skin folds of these areas:  Toes.  Armpits.  Groin.  Belly.  Breasts.  Buttocks.  Intertrigo is not passed from person to person (is not contagious). What are the causes? This condition is caused by heat, moisture, friction, and lack of air circulation. The condition can be made worse by:  Sweat.  Bacteria or a fungus, such as yeast.  What increases the risk? This condition is more likely to occur if you have moisture in your skin folds. It is also more likely to develop in people who:  Have diabetes.  Are overweight.  Are on bed rest.  Live in a warm and moist climate.  Wear splints, braces, or other medical devices.  Are not able to control their bowels or bladder (have incontinence).  What are the signs or symptoms? Symptoms of this condition include:  A pink or red skin rash.  Brown patches on the skin.  Raw or scaly skin.  Itchiness.  A burning feeling.  Bleeding.  Leaking fluid.  A bad smell.  How is this diagnosed? This condition is diagnosed with a medical history and physical exam. You may also have a skin swab to test for bacteria or a fungus, such as yeast. How is this treated? Treatment may include:  Cleaning and drying your skin.  An oral antibiotic medicine or antibiotic skin cream for a bacterial infection.  Antifungal cream or pills for an infection that was caused by a fungus, such as yeast.  Steroid ointment to relieve itchiness and irritation.  Follow these instructions at home:  Keep the affected area clean and dry.  Do not scratch your skin.  Stay in a cool environment as much as possible. Use an air conditioner or fan, if  available.  Apply over-the-counter and prescription medicines only as told by your health care provider.  If you were prescribed an antibiotic medicine, use it as told by your health care provider. Do not stop using the antibiotic even if your condition improves.  Keep all follow-up visits as told by your health care provider. This is important. How is this prevented?  Maintain a healthy weight.  Take care of your feet, especially if you have diabetes. Foot care includes: ? Wearing shoes that fit well. ? Keeping your feet dry. ? Wearing clean, breathable socks.  Protect the skin around your groin and buttocks, especially if you have incontinence. Skin protection includes: ? Following a regular cleaning routine. ? Using moisturizers and skin protectants. ? Changing protection pads frequently.  Do not wear tight clothes. Wear clothes that are loose and absorbent. Wear clothes that are made of cotton.  Wear a bra that gives good support, if needed.  Shower and dry yourself thoroughly after activity. Use a hair dryer on a cool setting to dry between skin folds, especially after you bathe.  If you have diabetes, keep your blood sugar under control. Contact a health care provider if:  Your symptoms do not improve with treatment.  Your symptoms get worse or they spread.  You notice increased redness and warmth.  You have a fever. This information is not intended to replace advice given to you by your health care provider. Make sure you discuss any questions  you have with your health care provider. Document Released: 02/20/2005 Document Revised: 07/29/2015 Document Reviewed: 08/24/2014 Elsevier Interactive Patient Education  2018 [employer].

## 2017-06-21 NOTE — Progress Notes (Signed)
Patient: Angel Lane Female    DOB: 05-26-1950   67 y.o.   MRN: [id number] Visit Date: 06/21/2017  Today's Provider: Lavon Paganini, MD   I, Martha Clan, CMA, am acting as scribe for Lavon Paganini, MD.  Chief Complaint  Patient presents with  . Hypertension  . Insomnia   Subjective:    HPI     Hypertension, follow-up:  BP Readings from Last 3 Encounters:  06/21/17 130/88  06/13/17 138/85  05/16/17 140/76    She was last seen for hypertension 3 months ago.  BP at that visit was 136/76. Management since that visit includes continuing current medications. She reports good compliance with treatment. She is having side effects. Possible itching from Metoprolol per pt. She is exercising. Using machine at home. She is adherent to low salt diet.   Outside systolic blood pressures are 117-140's. She is experiencing fatigue, near-syncope and palpitations.  Patient denies chest pain, chest pressure/discomfort, claudication, dyspnea, exertional chest pressure/discomfort, irregular heart beat, lower extremity edema, orthopnea, palpitations and syncope.   Cardiovascular risk factors include advanced age (older than 63 for men, 80 for women), dyslipidemia, hypertension and smoking/ tobacco exposure.      Weight trend: stable Wt Readings from Last 3 Encounters:  06/21/17 184 lb (83.5 kg)  06/13/17 184 lb (83.5 kg)  05/16/17 183 lb (83 kg)    Current diet: in general, a "healthy" diet    ------------------------------------------------------------------------  Follow up for Insomnia  The patient was last seen for this 3 months ago. Changes made at last visit include adding Trazodone 25-50 mg qhs PRN.  She reports good compliance with treatment. She feels that condition is Improved. She is not having side effects.   ----------------------------------------------------------------------------------  Patient states she is also concerned about rash.  She  states that it has been itching for several months.  It seems to be getting worse.  She noticed redness of her bilateral axilla and inguinal creases this seems to be getting worse.  She also noticed dry, itching, scaling of her scalp.  She has not changed any products that she is using recently.  She has never had rashes like these before.  There are also a few nonhealing bumps on her left outer thigh and in her left knee.  The one on the thigh has been there much longer than the one on the knee.  There is been no redness, tenderness, drainage.  She also noticed bumps on the bottom of her left foot that have been there for months.  They do not itch or hurt.   Allergies  Allergen Reactions  . Penicillins Other (See Comments)    chills     Current Outpatient Medications:  .  aspirin 325 MG EC tablet, Take 1 tablet (325 mg total) by mouth daily., Disp: 90 tablet, Rfl: 3 .  atorvastatin (LIPITOR) 40 MG tablet, Take 1 tablet (40 mg total) by mouth daily at 6 PM., Disp: 90 tablet, Rfl: 3 .  cholecalciferol (VITAMIN D) 1000 units tablet, Take 1,000 Units by mouth daily., Disp: , Rfl:  .  fluticasone (FLONASE) 50 MCG/ACT nasal spray, Place 2 sprays daily into both nostrils., Disp: 1 g, Rfl: 2 .  hydrochlorothiazide (HYDRODIURIL) 12.5 MG tablet, Take 1 tablet (12.5 mg total) by mouth daily., Disp: 90 tablet, Rfl: 2 .  metoprolol tartrate (LOPRESSOR) 25 MG tablet, Take 1 tablet (25 mg total) by mouth 2 (two) times daily., Disp: 180 tablet, Rfl: 3 .  prednisoLONE-Gatifloxacin 1-0.5 %  SOLN, Apply to eye., Disp: , Rfl:  .  traZODone (DESYREL) 50 MG tablet, Take 0.5-1 tablets (25-50 mg total) by mouth at bedtime as needed for sleep., Disp: 90 tablet, Rfl: 3 .  nicotine (NICODERM CQ - DOSED IN MG/24 HOURS) 21 mg/24hr patch, Place 1 patch (21 mg total) daily onto the skin. (Patient not taking: Reported on 06/21/2017), Disp: 28 patch, Rfl: 0  Review of Systems  Constitutional: Positive for fatigue. Negative for  activity change, appetite change, chills, diaphoresis, fever and unexpected weight change.  Eyes: Negative for visual disturbance.  Respiratory: Negative for shortness of breath.   Cardiovascular: Positive for palpitations. Negative for chest pain and leg swelling.  Skin:       Itching is present  Neurological: Positive for light-headedness. Negative for syncope.    Social History   Tobacco Use  . Smoking status: Current Every Day Smoker    Packs/day: 0.50    Years: 40.00    Pack years: 20.00    Types: Cigarettes    Last attempt to quit: 01/09/2017    Years since quitting: 0.4  . Smokeless tobacco: Never Used  . Tobacco comment: Less than 1/2 PPD  Substance Use Topics  . Alcohol use: No   Objective:   BP 130/88 (BP Location: Left Arm, Patient Position: Sitting, Cuff Size: Large)   Pulse 80   Temp 98 F (36.7 C) (Oral)   Resp 16   Wt 184 lb (83.5 kg)   SpO2 95%   BMI 30.62 kg/m  Vitals:   06/21/17 1109  BP: 130/88  Pulse: 80  Resp: 16  Temp: 98 F (36.7 C)  TempSrc: Oral  SpO2: 95%  Weight: 184 lb (83.5 kg)     Physical Exam  Constitutional: She is oriented to person, place, and time. She appears well-developed and well-nourished. No distress.  HENT:  Head: Normocephalic and atraumatic.  Eyes: Conjunctivae are normal. No scleral icterus.  Neck: Neck supple. No thyromegaly present.  Cardiovascular: Normal rate, regular rhythm, normal heart sounds and intact distal pulses.  No murmur heard. Pulmonary/Chest: Effort normal and breath sounds normal. No respiratory distress. She has no wheezes.  Abdominal: Soft. She exhibits no distension. There is no tenderness.  Musculoskeletal: She exhibits no edema.  Lymphadenopathy:    She has no cervical adenopathy.  Neurological: She is alert and oriented to person, place, and time.  Skin: Skin is warm and dry. Capillary refill takes less than 2 seconds.  Red raised irritation of bilateral axilla.  Thick, scaly plaques  over her scalp.  Hyperpigmented macules on left lateral thigh and left medial knee with central scabs.  Multiple plantar warts on left foot.  Psychiatric: She has a normal mood and affect. Her behavior is normal.  Vitals reviewed.      Assessment & Plan:   Problem List Items Addressed This Visit      Cardiovascular and Mediastinum   HTN (hypertension) - Primary    Well-controlled Continue current medications Recent BMP reviewed Follow-up in 6 months        Other   Insomnia    Well-controlled Continue trazodone low-dose nightly as needed       Other Visit Diagnoses    Candidal intertrigo       Relevant Medications   nystatin cream (MYCOSTATIN)   Psoriasis of scalp       Skin lesion of left leg       Relevant Orders   Ambulatory referral to Dermatology   Plantar  warts       Relevant Medications   nystatin cream (MYCOSTATIN)      2. Candidal intertrigo -Rash of the axilla and groin consistent with candidal intertrigo -Treat with nystatin cream twice daily until resolution -Does not appear superinfected, but discussed return precautions  3. Psoriasis of scalp -Rash on scalp appears thick and scaly like psoriasis -We will treat with high dose corticosteroid topically twice daily -If not improving or resolved at time of dermatology appointment, can discuss further  4. Skin lesion of left leg - Unknown lesions of left leg, but they are nonhealing wounds, so must consider nonmelanoma skin cancer -Referral to dermatology for further evaluation and possible biopsy - Ambulatory referral to Dermatology  5. Plantar warts -Lesions on plantar surface of left foot are plantar warts -As these are not bothersome at this time, we will continue to monitor -If they become bothersome, consider podiatry referral for further treatment   Return in about 3 months (around 09/20/2017) for CPE.   The entirety of the information documented in the History of Present Illness, Review of  Systems and Physical Exam were personally obtained by me. Portions of this information were initially documented by Raquel Sarna Ratchford, CMA and reviewed by me for thoroughness and accuracy.    Virginia Crews, MD, MPH [facility] 06/21/2017 2:37 PM

## 2017-06-21 NOTE — Assessment & Plan Note (Signed)
Well-controlled Continue trazodone low-dose nightly as needed

## 2017-09-20 ENCOUNTER — Encounter: Payer: Self-pay | Admitting: Family Medicine

## 2017-10-08 ENCOUNTER — Ambulatory Visit (INDEPENDENT_AMBULATORY_CARE_PROVIDER_SITE_OTHER): Payer: Federal, State, Local not specified - PPO | Admitting: Family Medicine

## 2017-10-08 ENCOUNTER — Encounter: Payer: Self-pay | Admitting: Family Medicine

## 2017-10-08 VITALS — BP 124/78 | HR 74 | Temp 97.6°F | Ht 65.0 in | Wt 180.0 lb

## 2017-10-08 DIAGNOSIS — I10 Essential (primary) hypertension: Secondary | ICD-10-CM | POA: Diagnosis not present

## 2017-10-08 DIAGNOSIS — Z23 Encounter for immunization: Secondary | ICD-10-CM

## 2017-10-08 DIAGNOSIS — Z720 Tobacco use: Secondary | ICD-10-CM

## 2017-10-08 DIAGNOSIS — Z1239 Encounter for other screening for malignant neoplasm of breast: Secondary | ICD-10-CM

## 2017-10-08 DIAGNOSIS — F4321 Adjustment disorder with depressed mood: Secondary | ICD-10-CM

## 2017-10-08 DIAGNOSIS — Z0000 Encounter for general adult medical examination without abnormal findings: Secondary | ICD-10-CM

## 2017-10-08 DIAGNOSIS — I639 Cerebral infarction, unspecified: Secondary | ICD-10-CM

## 2017-10-08 DIAGNOSIS — Z1159 Encounter for screening for other viral diseases: Secondary | ICD-10-CM

## 2017-10-08 DIAGNOSIS — Z780 Asymptomatic menopausal state: Secondary | ICD-10-CM

## 2017-10-08 DIAGNOSIS — R739 Hyperglycemia, unspecified: Secondary | ICD-10-CM | POA: Insufficient documentation

## 2017-10-08 DIAGNOSIS — Z122 Encounter for screening for malignant neoplasm of respiratory organs: Secondary | ICD-10-CM

## 2017-10-08 DIAGNOSIS — Z1231 Encounter for screening mammogram for malignant neoplasm of breast: Secondary | ICD-10-CM

## 2017-10-08 DIAGNOSIS — E7800 Pure hypercholesterolemia, unspecified: Secondary | ICD-10-CM

## 2017-10-08 MED ORDER — BUPROPION HCL 75 MG PO TABS: ORAL_TABLET | ORAL | 0 refills | Status: DC

## 2017-10-08 NOTE — Assessment & Plan Note (Signed)
Related to recent stroke Has never taken a medication before Will be taking Wellbutrin for smoking cessation that may also improve mood Contracted for safety No SI/HI Will f/u in 3 months and can consider addition of SSRI

## 2017-10-08 NOTE — Assessment & Plan Note (Signed)
3 to 5-minute discussion regarding importance of cessation, especially given her comorbidities including stroke We will also do lung cancer screening She understands that she is at increased risk of heart disease, recurrent stroke, cancers, lung disease, etc. from continued smoking She did not have success using NicoDerm patch Given option of Wellbutrin and Chantix Patient decides to try Wellbutrin this has been prescribed to her in the past by neurology, but she never took it I hope this will also help with her dysthymic mood

## 2017-10-08 NOTE — Patient Instructions (Signed)
Preventive Care 65 Years and Older, Female Preventive care refers to lifestyle choices and visits with your health care provider that can promote health and wellness. What does preventive care include?  A yearly physical exam. This is also called an annual well check.  Dental exams once or twice a year.  Routine eye exams. Ask your health care provider how often you should have your eyes checked.  Personal lifestyle choices, including: ? Daily care of your teeth and gums. ? Regular physical activity. ? Eating a healthy diet. ? Avoiding tobacco and drug use. ? Limiting alcohol use. ? Practicing safe sex. ? Taking low-dose aspirin every day. ? Taking vitamin and mineral supplements as recommended by your health care provider. What happens during an annual well check? The services and screenings done by your health care provider during your annual well check will depend on your age, overall health, lifestyle risk factors, and family history of disease. Counseling Your health care provider may ask you questions about your:  Alcohol use.  Tobacco use.  Drug use.  Emotional well-being.  Home and relationship well-being.  Sexual activity.  Eating habits.  History of falls.  Memory and ability to understand (cognition).  Work and work environment.  Reproductive health.  Screening You may have the following tests or measurements:  Height, weight, and BMI.  Blood pressure.  Lipid and cholesterol levels. These may be checked every 5 years, or more frequently if you are over 50 years old.  Skin check.  Lung cancer screening. You may have this screening every year starting at age 55 if you have a 30-pack-year history of smoking and currently smoke or have quit within the past 15 years.  Fecal occult blood test (FOBT) of the stool. You may have this test every year starting at age 50.  Flexible sigmoidoscopy or colonoscopy. You may have a sigmoidoscopy every 5 years or  a colonoscopy every 10 years starting at age 50.  Hepatitis C blood test.  Hepatitis B blood test.  Sexually transmitted disease (STD) testing.  Diabetes screening. This is done by checking your blood sugar (glucose) after you have not eaten for a while (fasting). You may have this done every 1-3 years.  Bone density scan. This is done to screen for osteoporosis. You may have this done starting at age 65.  Mammogram. This may be done every 1-2 years. Talk to your health care provider about how often you should have regular mammograms.  Talk with your health care provider about your test results, treatment options, and if necessary, the need for more tests. Vaccines Your health care provider may recommend certain vaccines, such as:  Influenza vaccine. This is recommended every year.  Tetanus, diphtheria, and acellular pertussis (Tdap, Td) vaccine. You may need a Td booster every 10 years.  Varicella vaccine. You may need this if you have not been vaccinated.  Zoster vaccine. You may need this after age 60.  Measles, mumps, and rubella (MMR) vaccine. You may need at least one dose of MMR if you were born in 1957 or later. You may also need a second dose.  Pneumococcal 13-valent conjugate (PCV13) vaccine. One dose is recommended after age 65.  Pneumococcal polysaccharide (PPSV23) vaccine. One dose is recommended after age 65.  Meningococcal vaccine. You may need this if you have certain conditions.  Hepatitis A vaccine. You may need this if you have certain conditions or if you travel or work in places where you may be exposed to hepatitis   A.  Hepatitis B vaccine. You may need this if you have certain conditions or if you travel or work in places where you may be exposed to hepatitis B.  Haemophilus influenzae type b (Hib) vaccine. You may need this if you have certain conditions.  Talk to your health care provider about which screenings and vaccines you need and how often you  need them. This information is not intended to replace advice given to you by your health care provider. Make sure you discuss any questions you have with your health care provider. Document Released: 03/19/2015 Document Revised: 11/10/2015 Document Reviewed: 12/22/2014 Elsevier Interactive Patient Education  2018 [employer]  

## 2017-10-08 NOTE — Progress Notes (Signed)
Patient: Angel Lane, Female    DOB: April 05, 1950, 67 y.o.   MRN: [id number] Visit Date: 10/08/2017  Today's Provider: Lavon Paganini, MD   Chief Complaint  Patient presents with  . Annual Exam   Subjective:     Complete Physical Angel Lane is a 67 y.o. female. She feels fairly well. She states she is still feeling the side effects from her stroke in November.  Pt also mentions her finger nails have changed in the last month.   She reports exercising some; she walks in her neighborhood. She reports she is sleeping poorly; she reports she wakes up everyday at 2am and has a hard time going back to sleep.  -----------------------------------------------------------   Review of Systems  Constitutional: Positive for fatigue. Negative for activity change, appetite change, chills, diaphoresis, fever and unexpected weight change.  HENT: Negative.   Eyes: Negative.   Respiratory: Negative.   Cardiovascular: Negative.   Gastrointestinal: Positive for abdominal distention. Negative for abdominal pain, anal bleeding, blood in stool, constipation, diarrhea, nausea, rectal pain and vomiting.  Endocrine: Positive for heat intolerance. Negative for cold intolerance, polydipsia, polyphagia and polyuria.  Genitourinary: Negative.   Musculoskeletal: Positive for back pain. Negative for arthralgias, gait problem, joint swelling, myalgias, neck pain and neck stiffness.  Skin: Negative.   Allergic/Immunologic: Positive for environmental allergies. Negative for food allergies and immunocompromised state.  Neurological: Negative.   Hematological: Negative for adenopathy. Bruises/bleeds easily.  Psychiatric/Behavioral: Positive for sleep disturbance. Negative for agitation, behavioral problems, confusion, decreased concentration, dysphoric mood, hallucinations, self-injury and suicidal ideas. The patient is not nervous/anxious and is not hyperactive.     Social History   Socioeconomic  History  . Marital status: Single    Spouse name: Not on file  . Number of children: Not on file  . Years of education: Not on file  . Highest education level: Not on file  Occupational History  . Not on file  Social Needs  . Financial resource strain: Not on file  . Food insecurity:    Worry: Not on file    Inability: Not on file  . Transportation needs:    Medical: Not on file    Non-medical: Not on file  Tobacco Use  . Smoking status: Current Every Day Smoker    Packs/day: 0.50    Years: 40.00    Pack years: 20.00    Types: Cigarettes  . Smokeless tobacco: Never Used  . Tobacco comment: Less than 1/2 PPD  Substance and Sexual Activity  . Alcohol use: No  . Drug use: No  . Sexual activity: Yes  Lifestyle  . Physical activity:    Days per week: Not on file    Minutes per session: Not on file  . Stress: Not on file  Relationships  . Social connections:    Talks on phone: Not on file    Gets together: Not on file    Attends religious service: Not on file    Active member of club or organization: Not on file    Attends meetings of clubs or organizations: Not on file    Relationship status: Not on file  . Intimate partner violence:    Fear of current or ex partner: Not on file    Emotionally abused: Not on file    Physically abused: Not on file    Forced sexual activity: Not on file  Other Topics Concern  . Not on file  Social History Narrative  .  Not on file    Past Medical History:  Diagnosis Date  . Dyspnea    due to stroke  . Hypertension   . Neuromuscular disorder ([place])    left sided weakness from stroke  . Sleep apnea   . Stroke [facility])    November 2018      Patient Active Problem List   Diagnosis Date Noted  . Adjustment disorder with depressed mood 10/08/2017  . Pure hypercholesterolemia 10/08/2017  . Hyperglycemia 10/08/2017  . Insomnia 03/23/2017  . Tobacco abuse disorder 01/15/2017  . Left hemiparesis ([place]) 01/06/2017  . HTN (hypertension)  01/06/2017  . CVA (cerebral vascular accident) ([place]) 01/06/2017    Past Surgical History:  Procedure Laterality Date  . ABDOMINAL SURGERY    . CARDIAC CATHETERIZATION    . CATARACT EXTRACTION W/PHACO Left 05/16/2017   Procedure: CATARACT EXTRACTION PHACO AND INTRAOCULAR LENS PLACEMENT ([place]) LEFT;  Surgeon: Leandrew Koyanagi, MD;  Location: [place];  Service: Ophthalmology;  Laterality: Left;  . CATARACT EXTRACTION W/PHACO Right 06/13/2017   Procedure: CATARACT EXTRACTION PHACO AND INTRAOCULAR LENS PLACEMENT ([place]) RIGHT;  Surgeon: Leandrew Koyanagi, MD;  Location: [place];  Service: Ophthalmology;  Laterality: Right;  sleep apnea  . CHOLECYSTECTOMY      Her family history includes Asthma in her mother; Heart attack in her father; Heart failure in her mother; Hypertension in her mother.      Current Outpatient Medications:  .  aspirin 325 MG EC tablet, Take 1 tablet (325 mg total) by mouth daily., Disp: 90 tablet, Rfl: 3 .  atorvastatin (LIPITOR) 40 MG tablet, Take 1 tablet (40 mg total) by mouth daily at 6 PM., Disp: 90 tablet, Rfl: 3 .  cholecalciferol (VITAMIN D) 1000 units tablet, Take 1,000 Units by mouth daily., Disp: , Rfl:  .  fluticasone (FLONASE) 50 MCG/ACT nasal spray, Place 2 sprays daily into both nostrils., Disp: 1 g, Rfl: 2 .  hydrochlorothiazide (HYDRODIURIL) 12.5 MG tablet, Take 1 tablet (12.5 mg total) by mouth daily., Disp: 90 tablet, Rfl: 2 .  metoprolol tartrate (LOPRESSOR) 25 MG tablet, Take 1 tablet (25 mg total) by mouth 2 (two) times daily. (Patient taking differently: Take 25 mg by mouth daily. ), Disp: 180 tablet, Rfl: 3 .  buPROPion (WELLBUTRIN) 75 MG tablet, Take 2 tablets (150 mg total) by mouth daily for 3 days, THEN 2 tablets (150 mg total) 2 (two) times daily., Disp: 126 tablet, Rfl: 0 .  traZODone (DESYREL) 50 MG tablet, Take 0.5-1 tablets (25-50 mg total) by mouth at bedtime as needed for sleep. (Patient not taking: Reported on  10/08/2017), Disp: 90 tablet, Rfl: 3  Patient Care Team: Virginia Crews, MD as PCP - General (Family Medicine)     Objective:   Vitals: BP 124/78 (BP Location: Left Arm, Patient Position: Sitting, Cuff Size: Normal)   Pulse 74   Temp 97.6 F (36.4 C) (Oral)   Ht 5\' 5"  (1.651 m)   Wt 180 lb (81.6 kg)   SpO2 96%   BMI 29.95 kg/m   Physical Exam  Constitutional: She is oriented to person, place, and time. She appears well-developed and well-nourished. No distress.  HENT:  Head: Normocephalic and atraumatic.  Right Ear: External ear normal.  Left Ear: External ear normal.  Nose: Nose normal.  Mouth/Throat: Oropharynx is clear and moist.  Eyes: Pupils are equal, round, and reactive to light. Conjunctivae and EOM are normal. No scleral icterus.  Neck: Neck supple. No thyromegaly present.  Cardiovascular:  Normal rate, regular rhythm, normal heart sounds and intact distal pulses.  No murmur heard. Pulmonary/Chest: Effort normal and breath sounds normal. No respiratory distress. She has no wheezes. She has no rales.  Abdominal: Soft. Bowel sounds are normal. She exhibits no distension. There is no tenderness. There is no rebound and no guarding.  Genitourinary:  Genitourinary Comments: Breasts: breasts appear normal, no suspicious masses, no skin or nipple changes or axillary nodes.   Musculoskeletal: She exhibits no edema or deformity.  Lymphadenopathy:    She has no cervical adenopathy.  Neurological: She is alert and oriented to person, place, and time. No cranial nerve deficit.  Skin: Skin is warm and dry. Capillary refill takes less than 2 seconds. No rash noted.  Psychiatric: She has a normal mood and affect. Her behavior is normal.  Vitals reviewed.   Activities of Daily Living In your present state of health, do you have any difficulty performing the following activities: 10/08/2017 06/13/2017  Hearing? N N  Vision? N N  Difficulty concentrating or making decisions? Y N   Walking or climbing stairs? Y N  Dressing or bathing? N N  Doing errands, shopping? N -  Some recent data might be hidden    Fall Risk Assessment Fall Risk  10/08/2017  Falls in the past year? No     Depression Screen PHQ 2/9 Scores 10/08/2017  PHQ - 2 Score 3  PHQ- 9 Score 9    Cognitive Testing - 6-CIT  Correct? Score   What year is it? yes 0 0 or 4  What month is it? yes 0 0 or 3  Memorize:    Pia Mau,  41,  [address]  [place],      What time is it? (within 1 hour) yes 0 0 or 3  Count backwards from 20 yes 0 0, 2, or 4  Name the months of the year yes 0 0, 2, or 4  Repeat name & address above no 2 0, 2, 4, 6, 8, or 10       TOTAL SCORE  2/28      Assessment & Plan:    Annual Physical Reviewed patient's Family Medical History Reviewed and updated list of patient's medical providers Assessment of cognitive impairment was done Assessed patient's functional ability Established a written schedule for health screening [place] Completed and Reviewed  Exercise Activities and Dietary recommendations Goals    None      Immunization History  Administered Date(s) Administered  . Pneumococcal Conjugate-13 03/23/2017  . Tdap 10/08/2017    Health Maintenance  Topic Date Due  . Hepatitis C Screening  August 04, 1950  . TETANUS/TDAP  03/30/1968  . MAMMOGRAM  03/31/1999  . DEXA SCAN  03/30/2014  . INFLUENZA VACCINE  10/04/2017  . PNA vac Low Risk Adult (2 of 2 - PPSV23) 03/23/2018  . COLONOSCOPY  02/23/2019     Discussed health benefits of physical activity, and encouraged her to engage in regular exercise appropriate for her age and condition.    ------------------------------------------------------------------------------------------------------------  Problem List Items Addressed This Visit      Cardiovascular and Mediastinum   HTN (hypertension)   CVA (cerebral vascular accident) ([place])   Relevant Orders   Lipid panel   CBC      Other   Tobacco abuse disorder    3 to 5-minute discussion regarding importance of cessation, especially given her comorbidities including stroke We will also do lung cancer screening She understands that she  is at increased risk of heart disease, recurrent stroke, cancers, lung disease, etc. from continued smoking She did not have success using NicoDerm patch Given option of Wellbutrin and Chantix Patient decides to try Wellbutrin this has been prescribed to her in the past by neurology, but she never took it I hope this will also help with her dysthymic mood      Relevant Orders   CBC   Adjustment disorder with depressed mood    Related to recent stroke Has never taken a medication before Will be taking Wellbutrin for smoking cessation that may also improve mood Contracted for safety No SI/HI Will f/u in 3 months and can consider addition of SSRI      Pure hypercholesterolemia   Relevant Orders   Lipid panel   Hyperglycemia   Relevant Orders   Hemoglobin A1c    Other Visit Diagnoses    Encounter for annual physical exam    -  Primary   Relevant Orders   Lipid panel   Comprehensive metabolic panel   CBC   Screening for breast cancer       Relevant Orders   MS DIGITAL SCREENING TOMO BILATERAL   Postmenopausal       Relevant Orders   DG BONE DENSITY (DXA)   Encounter for screening for lung cancer       Relevant Orders   Ambulatory Referral for Lung Cancer Scre   Need for hepatitis C screening test       Relevant Orders   Hepatitis C Antibody   Need for Tdap vaccination       Relevant Orders   Tdap vaccine greater than or equal to 7yo IM (Completed)       Return in about 3 months (around 01/08/2018) for smoking and mood f/u.   The entirety of the information documented in the History of Present Illness, Review of Systems and Physical Exam were personally obtained by me. Portions of this information were initially documented by Ashley Royalty, CMA and reviewed by me  for thoroughness and accuracy.    Virginia Crews, MD, MPH [facility] 10/08/2017 11:54 AM

## 2017-10-09 ENCOUNTER — Telehealth: Payer: Self-pay

## 2017-10-09 DIAGNOSIS — E7800 Pure hypercholesterolemia, unspecified: Secondary | ICD-10-CM

## 2017-10-09 LAB — LIPID PANEL
Chol/HDL Ratio: 3.5 ratio (ref 0.0–4.4)
Cholesterol, Total: 184 mg/dL (ref 100–199)
HDL: 53 mg/dL (ref >39)
LDL Calculated: 114 mg/dL — ABNORMAL HIGH (ref 0–99)
Triglycerides: 87 mg/dL (ref 0–149)
VLDL Cholesterol Cal: 17 mg/dL (ref 5–40)

## 2017-10-09 LAB — CBC
Hematocrit: 41.9 % (ref 34.0–46.6)
Hemoglobin: 14 g/dL (ref 11.1–15.9)
MCH: 29.8 pg (ref 26.6–33.0)
MCHC: 33.4 g/dL (ref 31.5–35.7)
MCV: 89 fL (ref 79–97)
Platelets: 209 10*3/uL (ref 150–450)
RBC: 4.7 x10E6/uL (ref 3.77–5.28)
RDW: 13.2 % (ref 12.3–15.4)
WBC: 6.2 10*3/uL (ref 3.4–10.8)

## 2017-10-09 LAB — COMPREHENSIVE METABOLIC PANEL
ALT: 14 IU/L (ref 0–32)
AST: 19 IU/L (ref 0–40)
Albumin/Globulin Ratio: 1.9 (ref 1.2–2.2)
Albumin: 4.4 g/dL (ref 3.6–4.8)
Alkaline Phosphatase: 77 IU/L (ref 39–117)
BUN/Creatinine Ratio: 17 (ref 12–28)
BUN: 15 mg/dL (ref 8–27)
Bilirubin Total: 0.7 mg/dL (ref 0.0–1.2)
CO2: 25 mmol/L (ref 20–29)
Calcium: 9.7 mg/dL (ref 8.7–10.3)
Chloride: 99 mmol/L (ref 96–106)
Creatinine, Ser: 0.89 mg/dL (ref 0.57–1.00)
GFR calc Af Amer: 77 mL/min/{1.73_m2} (ref >59)
GFR calc non Af Amer: 67 mL/min/{1.73_m2} (ref >59)
Globulin, Total: 2.3 g/dL (ref 1.5–4.5)
Glucose: 91 mg/dL (ref 65–99)
Potassium: 4 mmol/L (ref 3.5–5.2)
Sodium: 139 mmol/L (ref 134–144)
Total Protein: 6.7 g/dL (ref 6.0–8.5)

## 2017-10-09 LAB — HEMOGLOBIN A1C
Est. average glucose Bld gHb Est-mCnc: 117 mg/dL
Hgb A1c MFr Bld: 5.7 % — ABNORMAL HIGH (ref 4.8–5.6)

## 2017-10-09 LAB — HEPATITIS C ANTIBODY: Hep C Virus Ab: <0.1 s/co ratio (ref 0.0–0.9)

## 2017-10-09 MED ORDER — ATORVASTATIN CALCIUM 80 MG PO TABS: 80.0000 mg | ORAL_TABLET | Freq: Every day | ORAL | 3 refills | Status: DC

## 2017-10-09 NOTE — Telephone Encounter (Signed)
lmtcb

## 2017-10-09 NOTE — Telephone Encounter (Signed)
-----   Message from Virginia Crews, MD sent at 10/09/2017  8:09 AM EDT ----- Cholesterol remains elevated.  Is patient taking atorvastatin regularly?  If not, she needs to.  If she is, she should increase to 80mg  daily.  We will recheck in 3-6 months.  Ok to send new Rx for 90 day supply with 3 refills of higher dose.  Normal kidney function, liver function, electrolytes, Blood counts.  A1c is still in prediabetes range.  Negative Hep C screening  Virginia Crews, MD, MPH [facility] 10/09/2017 8:09 AM

## 2017-10-09 NOTE — Telephone Encounter (Signed)
Advised patient of results. Patient reports that she was taking the atorvastatin regularly. Increased dose was sent into the pharmacy.

## 2017-10-10 ENCOUNTER — Telehealth: Payer: Self-pay | Admitting: *Deleted

## 2017-10-10 NOTE — Telephone Encounter (Signed)
Received referral for low dose lung cancer screening CT scan. Message left at phone number listed in EMR for patient to call me back to facilitate scheduling scan.  

## 2017-10-22 ENCOUNTER — Other Ambulatory Visit: Payer: Self-pay | Admitting: Family Medicine

## 2017-10-22 MED ORDER — BUPROPION HCL 75 MG PO TABS: ORAL_TABLET | ORAL | 1 refills | Status: DC

## 2017-10-22 NOTE — Addendum Note (Signed)
Addended by: Virginia Crews on: 10/22/2017 11:07 AM   Modules accepted: Orders

## 2017-11-09 ENCOUNTER — Telehealth: Payer: Self-pay | Admitting: *Deleted

## 2017-11-09 ENCOUNTER — Telehealth: Payer: Self-pay | Admitting: Family Medicine

## 2017-11-09 DIAGNOSIS — Z1239 Encounter for other screening for malignant neoplasm of breast: Secondary | ICD-10-CM

## 2017-11-09 NOTE — Telephone Encounter (Signed)
New order placed  Angel Lane, [name], MD, MPH [facility] 11/09/2017 9:49 AM

## 2017-11-09 NOTE — Telephone Encounter (Signed)
[facility] states that order that was entered  For mammogram is for bcep program.Can you put it back in using [id number] ?

## 2017-11-09 NOTE — Telephone Encounter (Signed)
Received referral for low dose lung cancer screening CT scan.  Message left at phone number listed in EMR for patient to call either myself or Shawn Perkins back at [phone] to facilitate scheduling the scan.    

## 2017-11-12 ENCOUNTER — Encounter: Payer: Self-pay | Admitting: *Deleted

## 2017-11-12 ENCOUNTER — Telehealth: Payer: Self-pay | Admitting: *Deleted

## 2017-11-12 DIAGNOSIS — Z122 Encounter for screening for malignant neoplasm of respiratory organs: Secondary | ICD-10-CM

## 2017-11-12 NOTE — Telephone Encounter (Signed)
Received a referral for initial lung cancer screening scan.  Contacted the patient and obtained their smoking history,currently smokes 1 ppd 53 pkyr history   as well as answering questions related to screening process.  Patient denies signs of lung cancer such as weight loss or hemoptysis at this time.  Patient denies comorbidity that would prevent curative treatment if lung cancer were found.  Patient is scheduled for the Shared Decision Making Visit and CT scan on 11-27-17 @1400 .

## 2017-11-27 ENCOUNTER — Encounter (INDEPENDENT_AMBULATORY_CARE_PROVIDER_SITE_OTHER): Payer: Self-pay

## 2017-11-27 ENCOUNTER — Inpatient Hospital Stay: Payer: Federal, State, Local not specified - PPO | Attending: Oncology | Admitting: Oncology

## 2017-11-27 ENCOUNTER — Ambulatory Visit
Admission: RE | Admit: 2017-11-27 | Discharge: 2017-11-27 | Disposition: A | Discharge status: Home / Self Care | Payer: Federal, State, Local not specified - PPO | Source: Ambulatory Visit | Attending: Oncology | Admitting: Oncology

## 2017-11-27 DIAGNOSIS — I700 Atherosclerosis of aorta: Secondary | ICD-10-CM | POA: Diagnosis not present

## 2017-11-27 DIAGNOSIS — Z87891 Personal history of nicotine dependence: Secondary | ICD-10-CM | POA: Insufficient documentation

## 2017-11-27 DIAGNOSIS — Z122 Encounter for screening for malignant neoplasm of respiratory organs: Secondary | ICD-10-CM | POA: Diagnosis present

## 2017-11-27 DIAGNOSIS — J439 Emphysema, unspecified: Secondary | ICD-10-CM | POA: Diagnosis not present

## 2017-11-27 DIAGNOSIS — R911 Solitary pulmonary nodule: Secondary | ICD-10-CM | POA: Diagnosis not present

## 2017-11-27 NOTE — Progress Notes (Signed)
In accordance with CMS guidelines, patient has met eligibility criteria including age, absence of signs or symptoms of lung cancer.  Social History   Tobacco Use  . Smoking status: Current Every Day Smoker    Packs/day: 1.00    Years: 53.00    Pack years: 53.00    Types: Cigarettes  . Smokeless tobacco: Never Used  . Tobacco comment: Less than 1/2 PPD  Substance Use Topics  . Alcohol use: No  . Drug use: No     A shared decision-making session was conducted prior to the performance of CT scan. This includes one or more decision aids, includes benefits and harms of screening, follow-up diagnostic testing, over-diagnosis, false positive rate, and total radiation exposure.  Counseling on the importance of adherence to annual lung cancer LDCT screening, impact of co-morbidities, and ability or willingness to undergo diagnosis and treatment is imperative for compliance of the program.  Counseling on the importance of continued smoking cessation for former smokers; the importance of smoking cessation for current smokers, and information about tobacco cessation interventions have been given to patient including [place] and [facility] quit [place] programs.  Written order for lung cancer screening with LDCT has been given to the patient and any and all questions have been answered to the best of my abilities.   Yearly follow up will be coordinated by Burgess Estelle, Thoracic Navigator.  Faythe Casa, NP 11/27/2017 2:33 PM

## 2017-11-29 ENCOUNTER — Telehealth: Payer: Self-pay | Admitting: *Deleted

## 2017-11-29 NOTE — Telephone Encounter (Signed)
Called patient to discuss results from LDCT screening, no answer, voicemail left for her to call back

## 2017-11-29 NOTE — Telephone Encounter (Signed)
Notified patient of LDCT lung cancer screening program results with recommendation for (*) month follow up imaging.  Also notified of incidental findings noted below  R/t the nodules and the need for repeat CT screening in 6 months, and is encouraged to discuss further questions with PCP who will receive a copy of this not and/or the CT reports.  Patient verbalized understanding.      IMPRESSION: Lung-RADS 3, probably benign findings. Short-term follow-up in 6 months is recommended with repeat low-dose chest CT without contrast (please use the following order, "CT CHEST LCS NODULE FOLLOW-UP W/O CM").  Dominant 6.1 mm nodule in the anterior right upper lobe.  Aortic Atherosclerosis (ICD10-I70.0) and Emphysema (ICD10-J43.9).

## 2017-12-04 ENCOUNTER — Other Ambulatory Visit: Payer: Federal, State, Local not specified - PPO

## 2017-12-18 ENCOUNTER — Other Ambulatory Visit: Payer: Federal, State, Local not specified - PPO

## 2017-12-27 ENCOUNTER — Encounter: Payer: Self-pay | Admitting: Occupational Therapy

## 2017-12-27 DIAGNOSIS — M6281 Muscle weakness (generalized): Secondary | ICD-10-CM

## 2017-12-27 NOTE — Therapy (Signed)
[place] MAIN [facility] [address] [place], [place], [address] Phone: [phone]   Fax:  [phone]  December 27, 2017    '@CCLISTADDRESS'$ @  Occupational Therapy Discharge Summary   Patient: Angel Lane MRN: [id number] Date of Birth: 1950-11-07  Diagnosis: Muscle weakness (generalized)  Referring Provider (OT): Dr. Brita Romp   The above patient had been seen in Occupational Therapy times for 11 scheduled visits.   The treatment consisted of ADL training, IADL training, UE  There. Ex., neuromuscular re-education, and pt. education. The patient is: improved  Subjective:  Pt.'s last OT visit was 04/03/2017.    OT Long Term Goals      OT LONG TERM GOAL #1   Title Pt. will be independent with HEP for LUE strength, and coordination skills.    Baseline  Eval: Requires assist    Time  12    Period  Weeks    Status  Partially Met      OT LONG TERM GOAL #2   Title Pt. will increase LUE strength by 2 mm grades to be able sweep independently.    Baseline  Eval: Pt. has difficulty    Time  12    Period  Weeks    Status  Partially Met      OT LONG TERM GOAL #3   Title Pt. will increase left grip strength by 5# to be able to hold, and use a vacuum.    Baseline  Eval: R: 50#, L: 20#    Time  12    Period  Weeks    Status  Achieved      OT LONG TERM GOAL #4   Title Pt. will improve left lateral pinch strength by 2# to be able to cut meat efficiently.    Baseline  Eval: R: 17#, L: 13#    Time  12    Period  Weeks    Status  Partially Met      OT LONG TERM GOAL #5   Title Pt. will increase left 3pt. pinch strength by 2# to be able to clip nails.    Baseline  Eval:  R: 16#, L: 10#    Time  12    Period  Weeks    Status  Partially Met      OT LONG TERM GOAL #6   Title Pt. will improve [facility] skills by 3 sec. of speed to be able to be able to ttishoes, and button efficiently.    Baseline  Eval: R: 19 sec., L: 33 sec.    Time   12    Period  Weeks    Status  Achieved        Harrel Carina, MS, OTR/L  [place] MAIN [facility] SERVICES [address] [place], [place], [address] Phone: [phone]   Fax:  [phone]  Patient: Angel Lane MRN: [id number] Date of Birth: [birth year]

## 2017-12-31 ENCOUNTER — Other Ambulatory Visit: Payer: Federal, State, Local not specified - PPO

## 2018-01-08 ENCOUNTER — Telehealth: Payer: Self-pay | Admitting: Family Medicine

## 2018-01-08 NOTE — Telephone Encounter (Signed)
Pt called wanting to know if there is something she can take over the counter for cold type symptoms  Pt's CB#  [phone]  Thanks  teri

## 2018-01-09 NOTE — Telephone Encounter (Signed)
Sure.  Plain mucinex (not sinus or DM) and Delsym for cough.  Virginia Crews, MD, MPH [facility] 01/09/2018 8:16 AM

## 2018-01-09 NOTE — Telephone Encounter (Signed)
Patient advised.

## 2018-01-10 ENCOUNTER — Ambulatory Visit: Payer: Federal, State, Local not specified - PPO | Admitting: Family Medicine

## 2018-02-06 ENCOUNTER — Ambulatory Visit
Admission: RE | Admit: 2018-02-06 | Discharge: 2018-02-06 | Disposition: A | Discharge status: Home / Self Care | Payer: Federal, State, Local not specified - PPO | Source: Ambulatory Visit | Attending: Family Medicine | Admitting: Family Medicine

## 2018-02-06 ENCOUNTER — Other Ambulatory Visit: Payer: Self-pay | Admitting: Family Medicine

## 2018-02-06 ENCOUNTER — Telehealth: Payer: Self-pay

## 2018-02-06 DIAGNOSIS — Z780 Asymptomatic menopausal state: Secondary | ICD-10-CM | POA: Insufficient documentation

## 2018-02-06 DIAGNOSIS — N6320 Unspecified lump in the left breast, unspecified quadrant: Secondary | ICD-10-CM

## 2018-02-06 DIAGNOSIS — R928 Other abnormal and inconclusive findings on diagnostic imaging of breast: Secondary | ICD-10-CM

## 2018-02-06 DIAGNOSIS — Z1239 Encounter for other screening for malignant neoplasm of breast: Secondary | ICD-10-CM | POA: Diagnosis present

## 2018-02-06 DIAGNOSIS — N6310 Unspecified lump in the right breast, unspecified quadrant: Secondary | ICD-10-CM

## 2018-02-06 NOTE — Telephone Encounter (Signed)
Patient has been advised. KW 

## 2018-02-06 NOTE — Telephone Encounter (Signed)
-----   Message from Virginia Crews, MD sent at 02/06/2018  1:17 PM EST ----- Bone density scan shows osteopenia (this is some bone loss, but not as bad as osteoporosis).  Recommend regular weight bearing exercise, avoiding smoking, and adequate Ca (1200mg /day) and Vit D (1000 units daily) via diet or supplement.  We will recheck in 2 years to ensure this hasn't worsened.

## 2018-02-21 ENCOUNTER — Ambulatory Visit
Admission: RE | Admit: 2018-02-21 | Discharge: 2018-02-21 | Disposition: A | Discharge status: Home / Self Care | Payer: Federal, State, Local not specified - PPO | Source: Ambulatory Visit | Attending: Family Medicine | Admitting: Family Medicine

## 2018-02-21 DIAGNOSIS — R928 Other abnormal and inconclusive findings on diagnostic imaging of breast: Secondary | ICD-10-CM

## 2018-02-21 DIAGNOSIS — N6310 Unspecified lump in the right breast, unspecified quadrant: Secondary | ICD-10-CM

## 2018-02-21 DIAGNOSIS — N6320 Unspecified lump in the left breast, unspecified quadrant: Secondary | ICD-10-CM

## 2018-03-12 ENCOUNTER — Encounter: Payer: Self-pay | Admitting: Family Medicine

## 2018-03-12 ENCOUNTER — Telehealth: Payer: Self-pay | Admitting: Family Medicine

## 2018-03-12 NOTE — Telephone Encounter (Signed)
Will need the date of jury duty and juror number.  Often there is a form to complete on the jury summons as well

## 2018-03-12 NOTE — Telephone Encounter (Signed)
Pt needing a note from Dr. B to excuse her from [place] Duty since she has had a stroke.  Pt cannot remember or recall things.  Please advise.  Thanks, [employer]

## 2018-03-12 NOTE — Telephone Encounter (Signed)
Jury duty date is 03/19/2017 and juror # is [phone]

## 2018-03-12 NOTE — Telephone Encounter (Signed)
Opened in error - tgh °

## 2018-03-13 ENCOUNTER — Telehealth: Payer: Self-pay | Admitting: Family Medicine

## 2018-03-13 NOTE — Telephone Encounter (Signed)
Letter will be left at front desk for pick-up.  I assumed date was in 2020, not 2019.

## 2018-03-13 NOTE — Telephone Encounter (Signed)
Yes 2020 sorry

## 2018-03-29 ENCOUNTER — Other Ambulatory Visit: Payer: Self-pay | Admitting: Family Medicine

## 2018-04-08 ENCOUNTER — Other Ambulatory Visit: Payer: Self-pay | Admitting: Family Medicine

## 2018-05-09 ENCOUNTER — Encounter: Payer: Self-pay | Admitting: *Deleted

## 2018-05-18 ENCOUNTER — Telehealth: Payer: Self-pay

## 2018-05-18 NOTE — Telephone Encounter (Signed)
Call pt regarding lung screening. Left message for pt to return call.  

## 2018-05-23 ENCOUNTER — Encounter: Payer: Self-pay | Admitting: *Deleted

## 2018-05-24 ENCOUNTER — Telehealth: Payer: Self-pay | Admitting: *Deleted

## 2018-05-24 NOTE — Telephone Encounter (Signed)
Detailed message left in attempt to schedule lung rads 3 follow up imaging. Patient is encouraged to call me back to schedule CT.

## 2018-05-28 ENCOUNTER — Telehealth: Payer: Self-pay | Admitting: *Deleted

## 2018-05-28 ENCOUNTER — Encounter: Payer: Self-pay | Admitting: *Deleted

## 2018-05-28 DIAGNOSIS — Z122 Encounter for screening for malignant neoplasm of respiratory organs: Secondary | ICD-10-CM

## 2018-05-28 DIAGNOSIS — R918 Other nonspecific abnormal finding of lung field: Secondary | ICD-10-CM

## 2018-05-28 DIAGNOSIS — Z87891 Personal history of nicotine dependence: Secondary | ICD-10-CM

## 2018-05-28 NOTE — Telephone Encounter (Signed)
Patient returned call and will be scheduled for lung screening follow up imaging.

## 2018-05-28 NOTE — Telephone Encounter (Signed)
Left message for patient to notify them that it is time to schedule low dose lung cancer screening CT scan followup. Instructed patient to call back to verify information prior to the scan being scheduled.  

## 2018-05-31 ENCOUNTER — Ambulatory Visit: Admission: RE | Admit: 2018-05-31 | Payer: Federal, State, Local not specified - PPO | Source: Ambulatory Visit

## 2018-05-31 ENCOUNTER — Telehealth: Payer: Self-pay | Admitting: *Deleted

## 2018-05-31 NOTE — Telephone Encounter (Signed)
Patient called to cancel appt for lung screening follow up imaging. She is concerned about risk of exposure to coronavirus and would like to be rescheduled after current restrictions are improved.

## 2018-07-12 ENCOUNTER — Telehealth: Payer: Self-pay | Admitting: *Deleted

## 2018-07-12 NOTE — Telephone Encounter (Signed)
Voicemail left in attempt to schedule lung screening follow up scan.

## 2018-08-23 ENCOUNTER — Telehealth: Payer: Self-pay

## 2018-08-23 NOTE — Telephone Encounter (Signed)
Call pt regarding lung screening. Left message for pt to return call.  

## 2018-09-23 ENCOUNTER — Telehealth: Payer: Self-pay | Admitting: *Deleted

## 2018-09-23 NOTE — Telephone Encounter (Signed)
Message left in attempt to schedule lcs nodule follow up imaging.

## 2018-11-14 IMAGING — CT CT CHEST LUNG CANCER SCREENING LOW DOSE W/O CM
2 of 5 series · 15 of 40 positions shown, 18 images · non-contrast
Comparison: None.

CLINICAL DATA: 67-year-old female current smoker, with 53 pack-year
history of smoking, for initial lung cancer screening

EXAM:
CT CHEST WITHOUT CONTRAST LOW-DOSE FOR LUNG CANCER SCREENING
TECHNIQUE: Multidetector CT imaging of the chest was performed following the
standard protocol without IV contrast.

[Series 3: lung · axial · 0.61mm/px · z∈[-1225,-928]mm · 12 of 327 slices shown, 15 images (1 of 2)]
[im 15/327  mediastinal]
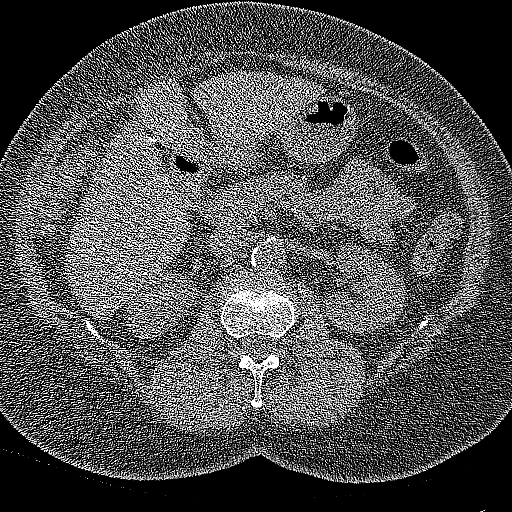
[im 15/327  lung]
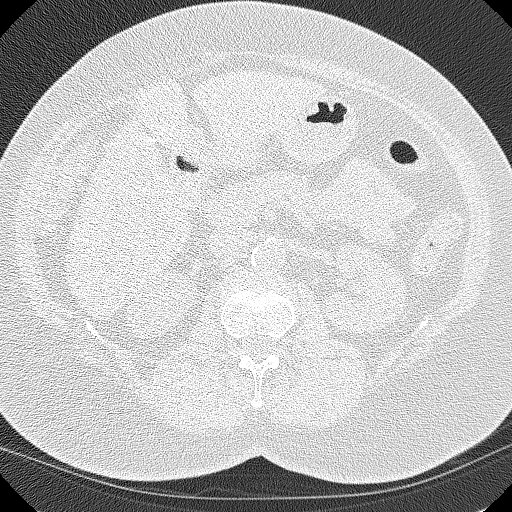
[im 45/327  lung]
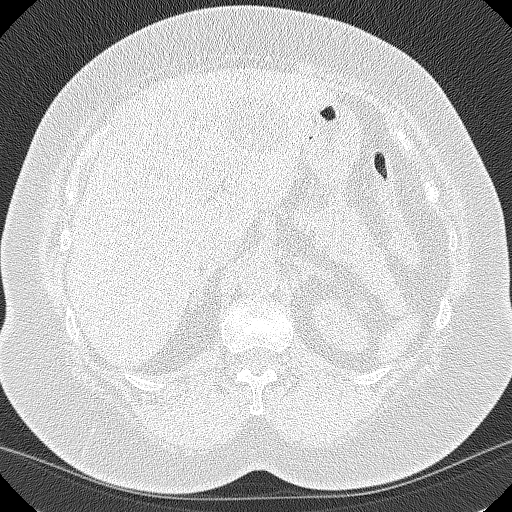
[im 75/327  lung]
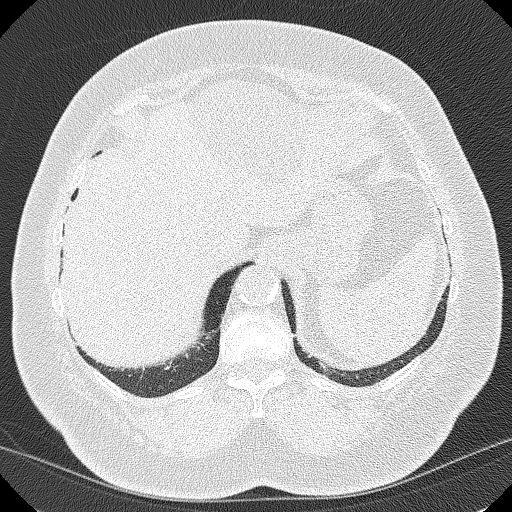
[im 104/327  lung]
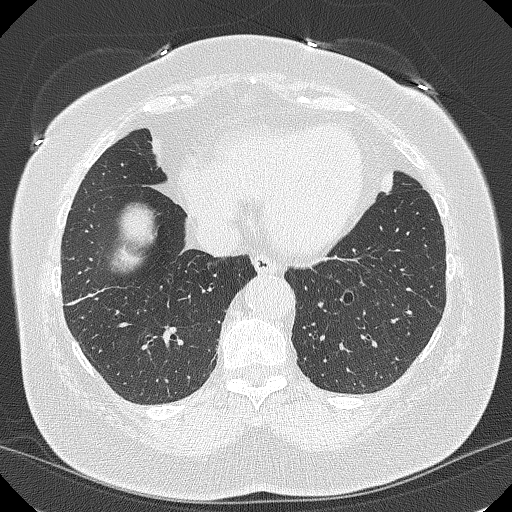
[im 119/327  mediastinal]
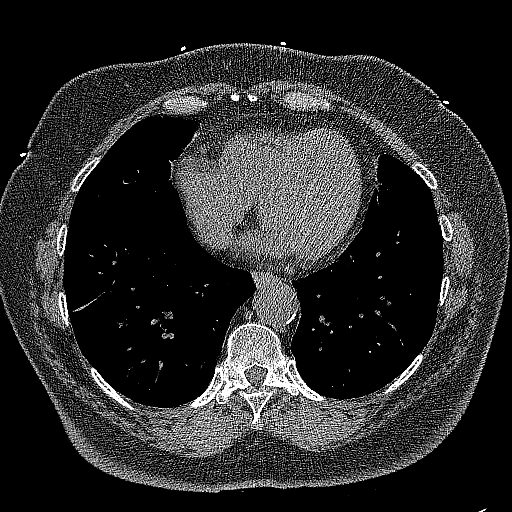
[im 119/327  lung]
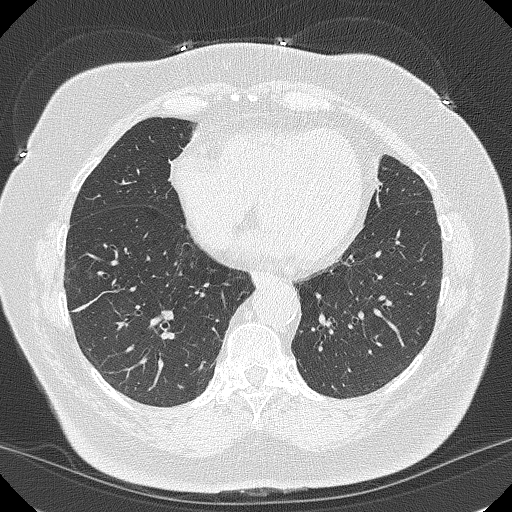
[im 149/327  lung]
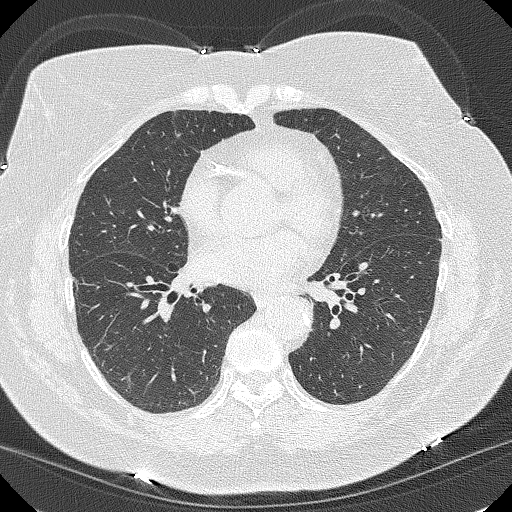
[im 178/327  lung]
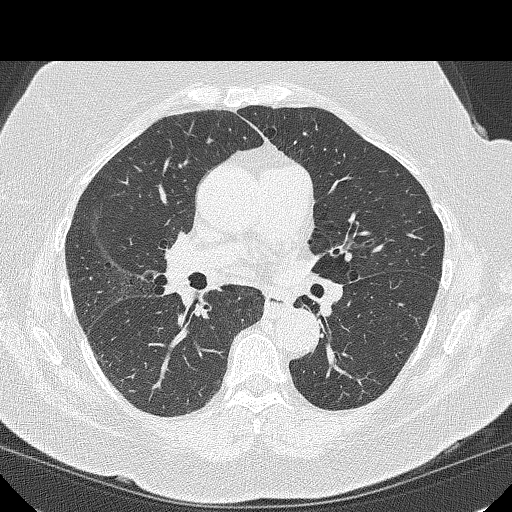
[im 208/327  lung]
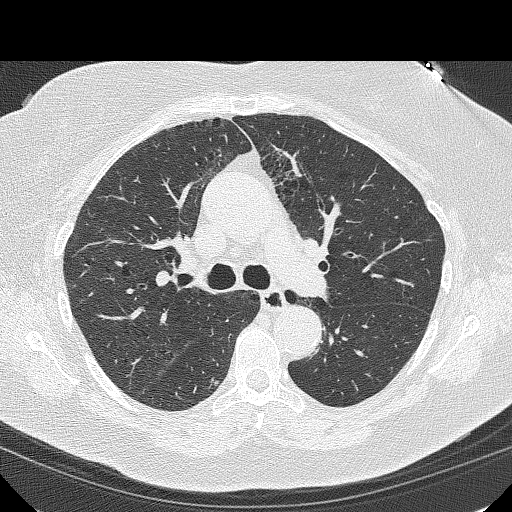
[im 223/327  mediastinal]
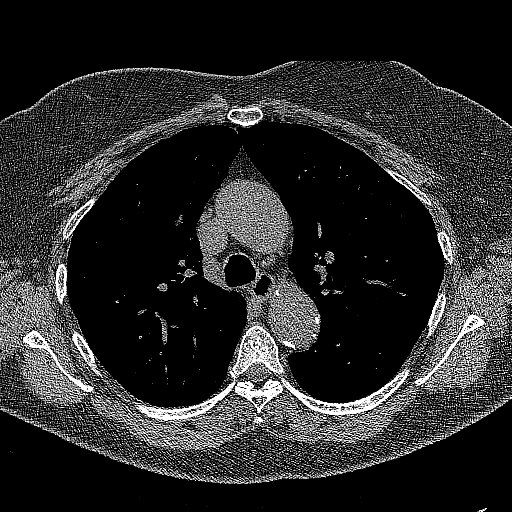
[im 223/327  lung]
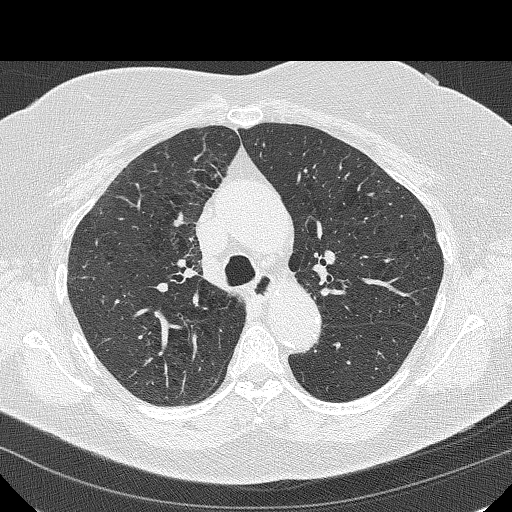
[im 252/327  lung]
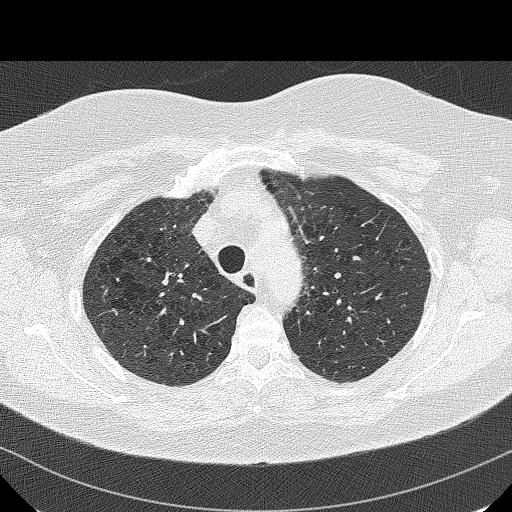
[im 282/327  lung]
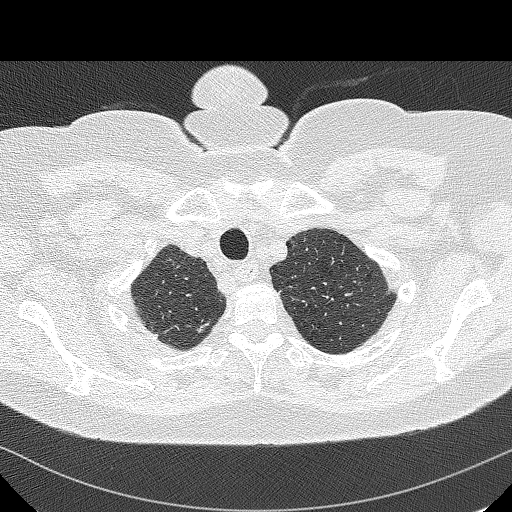
[im 312/327  lung]
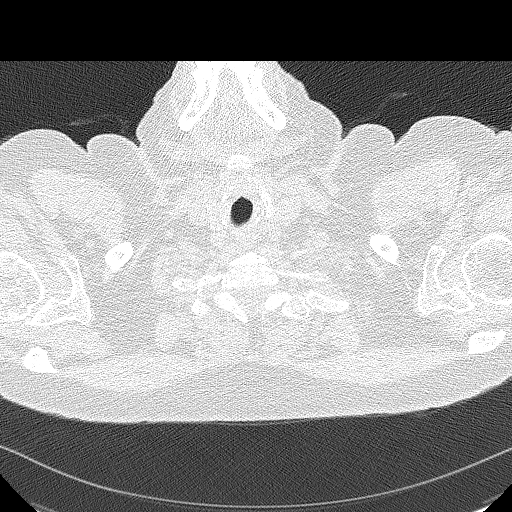

[Series 4: lung · coronal · 0.61mm/px · 3 of 266 slices shown (2 of 2)]
[im 54/266  lung]
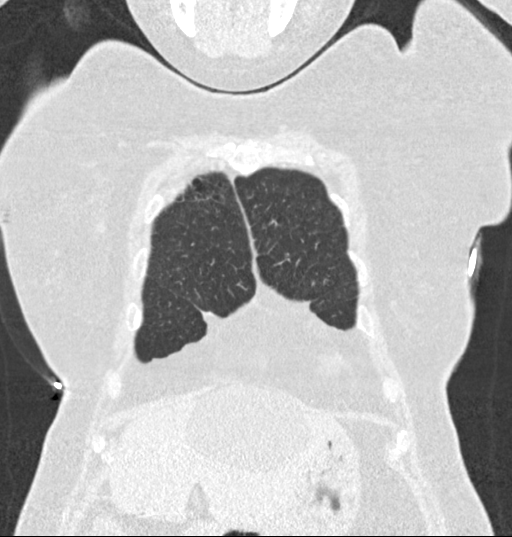
[im 107/266  lung]
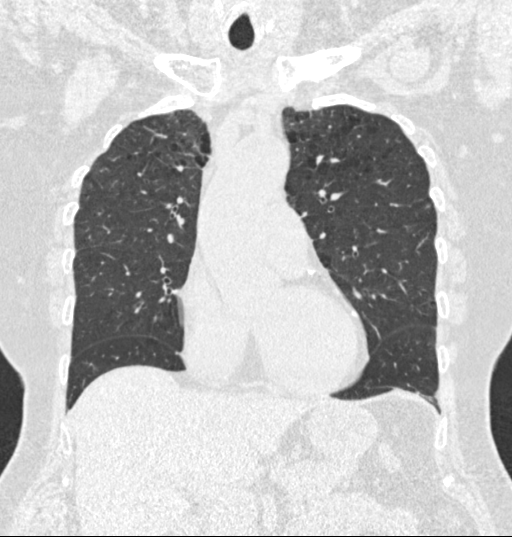
[im 160/266  lung]
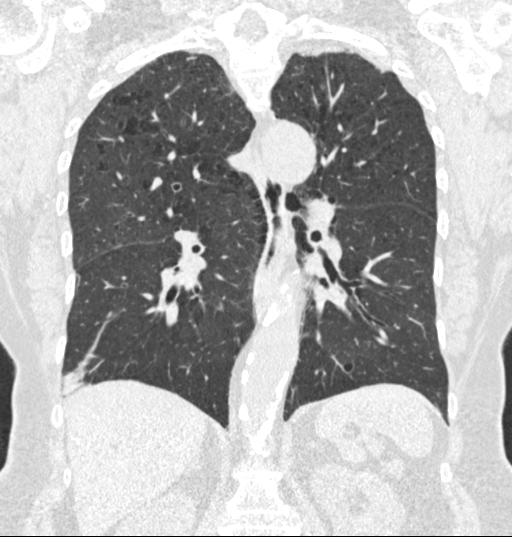

[15 of 40 positions shown; findings below may reference images not displayed]

FINDINGS: Cardiovascular: The heart is normal in size. No pericardial
effusion.

No evidence of thoracic aortic aneurysm. Atherosclerotic
calcifications of the aortic arch.

Coronary atherosclerosis of the LAD and right coronary artery.

Mediastinum/Nodes: No suspicious mediastinal lymphadenopathy.

Visualized thyroid is unremarkable.

Lungs/Pleura: Biapical pleural-parenchymal scarring.

Moderate centrilobular and paraseptal emphysematous changes, upper
lobe predominant.

No focal consolidation.

Scattered right lung nodules, including a dominant 6.1 mm nodule in
the anterior right upper lobe (image 89). Additional calcified
granulomata.

Dominant 5.2 mm subpleural nodule in the lateral left upper lobe.

No pleural effusion or pneumothorax.

Upper Abdomen: Visualized upper abdomen is notable for vascular
calcifications, prior cholecystectomy, and a dominant 7.9 cm left
hepatic lobe cyst.

Musculoskeletal: Visualized osseous structures are within normal
limits.
IMPRESSION: Lung-RADS 3, probably benign findings. Short-term follow-up in 6
months is recommended with repeat low-dose chest CT without contrast
(please use the following order, "CT CHEST LCS NODULE FOLLOW-UP W/O
CM").

Dominant 6.1 mm nodule in the anterior right upper lobe.

Aortic Atherosclerosis (WYBC7-I45.5) and Emphysema (WYBC7-OGT.S).

## 2018-11-19 ENCOUNTER — Encounter: Payer: Self-pay | Admitting: *Deleted

## 2019-05-09 ENCOUNTER — Encounter: Payer: Self-pay | Admitting: Family Medicine

## 2019-05-09 NOTE — Telephone Encounter (Signed)
Patient has not been seen in over a year. Is it okay to send 30 day supply no refills. Will call to schedule appt. Can it be virtual or in person. Please advise.

## 2019-05-09 NOTE — Telephone Encounter (Signed)
Yes.  Okay to send in 30-day supply as long as she schedules a follow-up appointment.  Prefer in person, but virtual is okay if she is not able to come into the office.

## 2019-05-13 MED ORDER — HYDROCHLOROTHIAZIDE 12.5 MG PO TABS: 12.5000 mg | ORAL_TABLET | Freq: Every day | ORAL | 0 refills | Status: DC

## 2019-05-29 ENCOUNTER — Encounter: Payer: Self-pay | Admitting: Family Medicine

## 2019-05-29 ENCOUNTER — Other Ambulatory Visit: Payer: Self-pay

## 2019-05-29 ENCOUNTER — Ambulatory Visit (INDEPENDENT_AMBULATORY_CARE_PROVIDER_SITE_OTHER): Payer: Federal, State, Local not specified - PPO | Admitting: Family Medicine

## 2019-05-29 VITALS — BP 133/86 | HR 80 | Temp 96.8°F | Wt 191.0 lb

## 2019-05-29 DIAGNOSIS — M7061 Trochanteric bursitis, right hip: Secondary | ICD-10-CM | POA: Diagnosis not present

## 2019-05-29 DIAGNOSIS — M7062 Trochanteric bursitis, left hip: Secondary | ICD-10-CM

## 2019-05-29 DIAGNOSIS — G4733 Obstructive sleep apnea (adult) (pediatric): Secondary | ICD-10-CM

## 2019-05-29 DIAGNOSIS — Z1211 Encounter for screening for malignant neoplasm of colon: Secondary | ICD-10-CM

## 2019-05-29 DIAGNOSIS — Z23 Encounter for immunization: Secondary | ICD-10-CM | POA: Diagnosis not present

## 2019-05-29 MED ORDER — MELOXICAM 15 MG PO TABS: 15.0000 mg | ORAL_TABLET | Freq: Every day | ORAL | 0 refills | Status: DC

## 2019-05-29 NOTE — Progress Notes (Signed)
Patient: Angel Lane Female    DOB: Jan 26, 1951   69 y.o.   MRN: [id number] Visit Date: 05/30/2019  Today's Provider: Lavon Paganini, MD   Chief Complaint  Patient presents with  . Hip Pain    Bilateral; Left worse than right.    Subjective:     Hip Pain  Incident onset: Pt states this is a chronic issue that has been getting worse recently. There was no injury mechanism (Pts she had a fall in the bathroom about a year ago.  She states her hips hurt before the fall. ). The pain is present in the left hip and right hip. The pain has been worsening since onset. Pertinent negatives include no inability to bear weight, loss of motion, loss of sensation, muscle weakness, numbness or tingling. Exacerbated by: lying down makes the pain worse. She has tried acetaminophen and heat for the symptoms. The treatment provided mild relief.   Worse with sleeping on her side L>R.  Feels like it radiates to ankle.  Worsening over the last 6 months.  Did not have hip pain after fall.  Allergies  Allergen Reactions  . Penicillins Other (See Comments)    chills     Current Outpatient Medications:  .  aspirin EC 325 MG tablet, TAKE 1 TABLET BY MOUTH EVERY DAY, Disp: 90 tablet, Rfl: 3 .  atorvastatin (LIPITOR) 80 MG tablet, Take 1 tablet (80 mg total) by mouth daily., Disp: 90 tablet, Rfl: 3 .  cholecalciferol (VITAMIN D) 1000 units tablet, Take 1,000 Units by mouth daily., Disp: , Rfl:  .  fluticasone (FLONASE) 50 MCG/ACT nasal spray, Place 2 sprays daily into both nostrils., Disp: 1 g, Rfl: 2 .  hydrochlorothiazide (HYDRODIURIL) 12.5 MG tablet, Take 1 tablet (12.5 mg total) by mouth daily., Disp: 30 tablet, Rfl: 0 .  buPROPion (WELLBUTRIN) 75 MG tablet, TAKE 2 TABLETS BY MOUTH DAILY FOR 3 DAYS, THEN 2 TABLETS TWO TIMES DAILY. (Patient not taking: Reported on 05/29/2019), Disp: 360 tablet, Rfl: 1 .  meloxicam (MOBIC) 15 MG tablet, Take 1 tablet (15 mg total) by mouth daily., Disp: 30 tablet,  Rfl: 0 .  metoprolol tartrate (LOPRESSOR) 25 MG tablet, Take 1 tablet (25 mg total) by mouth 2 (two) times daily. (Patient not taking: Reported on 05/29/2019), Disp: 180 tablet, Rfl: 3 .  traZODone (DESYREL) 50 MG tablet, Take 0.5-1 tablets (25-50 mg total) by mouth at bedtime as needed for sleep. (Patient not taking: Reported on 10/08/2017), Disp: 90 tablet, Rfl: 3  Review of Systems  Constitutional: Negative.   Respiratory: Positive for apnea.   Musculoskeletal: Positive for arthralgias. Negative for back pain, gait problem, joint swelling, myalgias, neck pain and neck stiffness.  Neurological: Negative for tingling and numbness.    Social History   Tobacco Use  . Smoking status: Current Every Day Smoker    Packs/day: 1.00    Years: 53.00    Pack years: 53.00    Types: Cigarettes  . Smokeless tobacco: Never Used  . Tobacco comment: Less than 1/2 PPD  Substance Use Topics  . Alcohol use: No      Objective:   BP 133/86 (BP Location: Left Arm, Patient Position: Sitting, Cuff Size: Large)   Pulse 80   Temp (!) 96.8 F (36 C) (Temporal)   Wt 191 lb (86.6 kg)   BMI 32.79 kg/m  Vitals:   05/29/19 1428  BP: 133/86  Pulse: 80  Temp: (!) 96.8 F (36 C)  TempSrc: Temporal  Weight: 191 lb (86.6 kg)  Body mass index is 32.79 kg/m.   Physical Exam Vitals reviewed.  Constitutional:      General: She is not in acute distress.    Appearance: Normal appearance. She is well-developed. She is not diaphoretic.  HENT:     Head: Normocephalic and atraumatic.  Eyes:     General: No scleral icterus.    Conjunctiva/sclera: Conjunctivae normal.  Neck:     Thyroid: No thyromegaly.  Cardiovascular:     Rate and Rhythm: Normal rate and regular rhythm.     Pulses: Normal pulses.     Heart sounds: Normal heart sounds. No murmur.  Pulmonary:     Effort: Pulmonary effort is normal. No respiratory distress.     Breath sounds: Normal breath sounds. No wheezing, rhonchi or rales.    Musculoskeletal:     Cervical back: Neck supple.     Right lower leg: No edema.     Left lower leg: No edema.     Comments: Back: No midline TTP, minimal SI joint tenderness bilaterally.  TTP over greater trochanter bilaterally L>R.  Hip ROM and strength intact. Negative SLR bilaterally.  Lymphadenopathy:     Cervical: No cervical adenopathy.  Skin:    General: Skin is warm and dry.     Findings: No rash.  Neurological:     Mental Status: She is alert and oriented to person, place, and time. Mental status is at baseline.  Psychiatric:        Mood and Affect: Mood normal.        Behavior: Behavior normal.     No results found for any visits on 05/29/19.     Assessment & Plan    1. Trochanteric bursitis of both hips - new problem - exam c/w trochanteric bursitis of bilateral hips (L>R) - offered corticosteroid injection, but patient declines today - will start MEloxicam and HEP and f/u in 1 month - consider steroid shot again at that time if not improving  2. Screen for colon cancer - Ambulatory referral to Gastroenterology  3. OSA (obstructive sleep apnea) - last sleep study 10 yrs ago - not using CPAP in many years - likely needs re-titration - will order split night study to reassess - Ambulatory referral to Sleep Studies  4. Need for pneumococcal vaccination - Pneumovax given today   Meds ordered this encounter  Medications  . meloxicam (MOBIC) 15 MG tablet    Sig: Take 1 tablet (15 mg total) by mouth daily.    Dispense:  30 tablet    Refill:  0     Return in about 4 weeks (around 06/26/2019) for chronic disease f/u.   The entirety of the information documented in the History of Present Illness, Review of Systems and Physical Exam were personally obtained by me. Portions of this information were initially documented by Ashley Royalty, CMA and reviewed by me for thoroughness and accuracy.    Midas Daughety, [name], MD MPH [facility]

## 2019-05-29 NOTE — Patient Instructions (Signed)
Hip Bursitis Rehab Ask your health care provider which exercises are safe for you. Do exercises exactly as told by your health care provider and adjust them as directed. It is normal to feel mild stretching, pulling, tightness, or discomfort as you do these exercises. Stop right away if you feel sudden pain or your pain gets worse. Do not begin these exercises until told by your health care provider. Stretching exercise This exercise warms up your muscles and joints and improves the movement and flexibility of your hip. This exercise also helps to relieve pain and stiffness. Iliotibial band stretch An iliotibial band is a strong band of muscle tissue that runs from the outer side of your hip to the outer side of your thigh and knee. 1. Lie on your side with your left / right leg in the top position. 2. Bend your left / right knee and grab your ankle. Stretch out your bottom arm to help you balance. 3. Slowly bring your knee back so your thigh is behind your body. 4. Slowly lower your knee toward the floor until you feel a gentle stretch on the outside of your left / right thigh. If you do not feel a stretch and your knee will not fall farther, place the heel of your other foot on top of your knee and pull your knee down toward the floor with your foot. 5. Hold this position for __________ seconds. 6. Slowly return to the starting position. Repeat __________ times. Complete this exercise __________ times a day. Strengthening exercises These exercises build strength and endurance in your hip and pelvis. Endurance is the ability to use your muscles for a long time, even after they get tired. Bridge This exercise strengthens the muscles that move your thigh backward (hip extensors). 1. Lie on your back on a firm surface with your knees bent and your feet flat on the floor. 2. Tighten your buttocks muscles and lift your buttocks off the floor until your trunk is level with your thighs. ? Do not arch  your back. ? You should feel the muscles working in your buttocks and the back of your thighs. If you do not feel these muscles, slide your feet 1-2 inches (2.5-5 cm) farther away from your buttocks. ? If this exercise is too easy, try doing it with your arms crossed over your chest. 3. Hold this position for __________ seconds. 4. Slowly lower your hips to the starting position. 5. Let your muscles relax completely after each repetition. Repeat __________ times. Complete this exercise __________ times a day. Squats This exercise strengthens the muscles in front of your thigh and knee (quadriceps). 1. Stand in front of a table, with your feet and knees pointing straight ahead. You may rest your hands on the table for balance but not for support. 2. Slowly bend your knees and lower your hips like you are going to sit in a chair. ? Keep your weight over your heels, not over your toes. ? Keep your lower legs upright so they are parallel with the table legs. ? Do not let your hips go lower than your knees. ? Do not bend lower than told by your health care provider. ? If your hip pain increases, do not bend as low. 3. Hold the squat position for __________ seconds. 4. Slowly push with your legs to return to standing. Do not use your hands to pull yourself to standing. Repeat __________ times. Complete this exercise __________ times a day. Hip hike 1. Stand   sideways on a bottom step. Stand on your left / right leg with your other foot unsupported next to the step. You can hold on to the railing or wall for balance if needed. 2. Keep your knees straight and your torso square. Then lift your left / right hip up toward the ceiling. 3. Hold this position for __________ seconds. 4. Slowly let your left / right hip lower toward the floor, past the starting position. Your foot should get closer to the floor. Do not lean or bend your knees. Repeat __________ times. Complete this exercise __________ times a  day. Single leg stand 1. Without shoes, stand near a railing or in a doorway. You may hold on to the railing or door frame as needed for balance. 2. Squeeze your left / right buttock muscles, then lift up your other foot. ? Do not let your left / right hip push out to the side. ? It is helpful to stand in front of a mirror for this exercise so you can watch your hip. 3. Hold this position for __________ seconds. Repeat __________ times. Complete this exercise __________ times a day. This information is not intended to replace advice given to you by your health care provider. Make sure you discuss any questions you have with your health care provider. Document Revised: 06/17/2018 Document Reviewed: 06/17/2018 Elsevier Patient Education  2020 [employer]  

## 2019-05-30 ENCOUNTER — Ambulatory Visit: Payer: Federal, State, Local not specified - PPO | Admitting: Family Medicine

## 2019-06-13 ENCOUNTER — Other Ambulatory Visit: Payer: Self-pay | Admitting: Family Medicine

## 2019-06-21 ENCOUNTER — Other Ambulatory Visit: Payer: Self-pay | Admitting: Family Medicine

## 2019-06-21 NOTE — Telephone Encounter (Signed)
Requested Prescriptions  Pending Prescriptions Disp Refills  . meloxicam (MOBIC) 15 MG tablet [Pharmacy Med Name: MELOXICAM 15 MG TABLET] 30 tablet 0    Sig: TAKE 1 TABLET BY MOUTH EVERY DAY     Analgesics:  COX2 Inhibitors Failed - 06/21/2019  9:39 AM      Failed - HGB in normal range and within 360 days    Hemoglobin  Date Value Ref Range Status  10/08/2017 14.0 11.1 - 15.9 g/dL Final         Failed - Cr in normal range and within 360 days    Creatinine  Date Value Ref Range Status  10/29/2011 0.87 0.60 - 1.30 mg/dL Final   Creatinine, Ser  Date Value Ref Range Status  10/08/2017 0.89 0.57 - 1.00 mg/dL Final         Passed - Patient is not pregnant      Passed - Valid encounter within last 12 months    Recent Outpatient Visits          3 weeks ago Trochanteric bursitis of both hips   [facility] [place], [name], MD   1 year ago Encounter for annual physical exam   [facility] [place], [name], MD   2 years ago Essential hypertension   [place], [name], MD   2 years ago Essential hypertension   [place], [name], MD   2 years ago Cerebrovascular accident (CVA), unspecified mechanism [facility])   [place], [name], MD      Future Appointments            In 1 week Bacigalupo, [name], MD [facility]

## 2019-07-02 ENCOUNTER — Other Ambulatory Visit: Payer: Self-pay

## 2019-07-02 ENCOUNTER — Encounter: Payer: Self-pay | Admitting: Family Medicine

## 2019-07-02 ENCOUNTER — Ambulatory Visit: Payer: Federal, State, Local not specified - PPO | Admitting: Family Medicine

## 2019-07-02 VITALS — BP 123/81 | HR 86 | Temp 96.6°F | Wt 191.0 lb

## 2019-07-02 DIAGNOSIS — E7800 Pure hypercholesterolemia, unspecified: Secondary | ICD-10-CM | POA: Diagnosis not present

## 2019-07-02 DIAGNOSIS — I10 Essential (primary) hypertension: Secondary | ICD-10-CM | POA: Diagnosis not present

## 2019-07-02 DIAGNOSIS — M7061 Trochanteric bursitis, right hip: Secondary | ICD-10-CM

## 2019-07-02 DIAGNOSIS — Z720 Tobacco use: Secondary | ICD-10-CM

## 2019-07-02 DIAGNOSIS — G8194 Hemiplegia, unspecified affecting left nondominant side: Secondary | ICD-10-CM

## 2019-07-02 DIAGNOSIS — H6121 Impacted cerumen, right ear: Secondary | ICD-10-CM

## 2019-07-02 DIAGNOSIS — M7062 Trochanteric bursitis, left hip: Secondary | ICD-10-CM

## 2019-07-02 DIAGNOSIS — R739 Hyperglycemia, unspecified: Secondary | ICD-10-CM | POA: Diagnosis not present

## 2019-07-02 DIAGNOSIS — E559 Vitamin D deficiency, unspecified: Secondary | ICD-10-CM | POA: Insufficient documentation

## 2019-07-02 MED ORDER — BUPROPION HCL 75 MG PO TABS: ORAL_TABLET | ORAL | 1 refills | Status: DC

## 2019-07-02 NOTE — Assessment & Plan Note (Signed)
Discussed using OTC Debrox in right ear.

## 2019-07-02 NOTE — Assessment & Plan Note (Signed)
Recheck labs today. 

## 2019-07-02 NOTE — Assessment & Plan Note (Signed)
Well controlled  No changes in medications  Recheck BMP today

## 2019-07-02 NOTE — Assessment & Plan Note (Signed)
Tolerating Atorvastatin 80mg  well Recheck labs today.

## 2019-07-02 NOTE — Assessment & Plan Note (Signed)
Chronic and stable.   

## 2019-07-02 NOTE — Assessment & Plan Note (Addendum)
Has tried nicotine patches in the past with no success. Pt motivated to quit smoking.  Has not tried Wellbutrin yet.  Pt agreed start it now.  3-5 min discussion regarding the harms of continuing to smoke and the benefits of cessation

## 2019-07-02 NOTE — Assessment & Plan Note (Addendum)
Left worse than right.  Hip pain improved from last visit Pt reports numbness that comes and goes in her left thigh - if persists, consider neurology referral

## 2019-07-02 NOTE — Assessment & Plan Note (Signed)
Pre-diabetic range  Discussed importance of diet and exercise to avoid going becoming diabetic.

## 2019-07-02 NOTE — Progress Notes (Signed)
I,Laura E Walsh,acting as a scribe for Lavon Paganini, MD.,have documented all relevant documentation on the behalf of Lavon Paganini, MD,as directed by  Lavon Paganini, MD while in the presence of Lavon Paganini, MD.  Established patient visit   Patient: Angel Lane   DOB: 1950-12-27   68 y.o. Female  MRN: [id number] Visit Date: 07/02/2019  Today's healthcare provider: Lavon Paganini, MD   Chief Complaint  Patient presents with  . Hypertension  . Hyperlipidemia  . Hyperglycemia  . Hip Pain   Subjective    HPI Lipid/Cholesterol, Follow-up  Last lipid panel Other pertinent labs  Lab Results  Component Value Date   CHOL 184 10/08/2017   HDL 53 10/08/2017   LDLCALC 114 (H) 10/08/2017   TRIG 87 10/08/2017   CHOLHDL 3.5 10/08/2017   Lab Results  Component Value Date   ALT 14 10/08/2017   AST 19 10/08/2017   PLT 209 10/08/2017   TSH 0.746 10/29/2011     She was last seen for this 20 months ago.  Management since that visit includes Increased Lipitor to 80mg  a day.  She reports excellent compliance with treatment. She is not having side effects.  Symptoms: No chest pain No chest pressure/discomfort No dyspnea No lower extremity edema No numbness or tingling of extremity No orthopnea No palpitations No paroxysmal nocturnal dyspnea No speech difficulty No syncope  Current diet: in general, a "healthy" diet     Wt Readings from Last 3 Encounters:  07/02/19 191 lb (86.6 kg)  05/29/19 191 lb (86.6 kg)  11/27/17 184 lb (83.5 kg)   The ASCVD Risk score Mikey Bussing DC Jr., et al., 2013) failed to calculate for the following reasons:   The patient has a prior MI or stroke diagnosis    Hypertension, follow-up  BP Readings from Last 3 Encounters:  07/02/19 123/81  05/29/19 133/86  10/08/17 124/78   Wt Readings from Last 3 Encounters:  07/02/19 191 lb (86.6 kg)  05/29/19 191 lb (86.6 kg)  11/27/17 184 lb (83.5 kg)     She was last seen for  hypertension 20 months ago.  Management since that visit includes No changes.  She reports excellent compliance with treatment. She is not having side effects.  She is following a Regular diet. She is exercising. She does smoke.  Use of agents associated with hypertension: none.   Outside blood pressures are 130's/80's. Symptoms: No chest pain No chest pressure No palpitations No dyspnea No orthopnea No paroxysmal nocturnal dyspnea No lower extremity edema No syncope   Pertinent labs: Lab Results  Component Value Date   CHOL 184 10/08/2017   HDL 53 10/08/2017   LDLCALC 114 (H) 10/08/2017   TRIG 87 10/08/2017   CHOLHDL 3.5 10/08/2017   Lab Results  Component Value Date   NA 139 10/08/2017   K 4.0 10/08/2017   CO2 25 10/08/2017   GLUCOSE 91 10/08/2017   BUN 15 10/08/2017   CREATININE 0.89 10/08/2017   CALCIUM 9.7 10/08/2017   GFRNONAA 67 10/08/2017   GFRAA 77 10/08/2017     The ASCVD Risk score (Goff DC Jr., et al., 2013) failed to calculate for the following reasons:   The patient has a prior MI or stroke diagnosis   ---------------------------------------------------------------------------------------------------   ----------------------------------------------------------------------------------------- Prediabetes, Follow-up  Lab Results  Component Value Date   HGBA1C 5.7 (H) 10/08/2017   HGBA1C 5.8 (H) 01/06/2017   GLUCOSE 91 10/08/2017   GLUCOSE 109 (H) 01/05/2017   GLUCOSE 130 (  H) 12/23/2016    Last seen for for this 20 months ago.  Management since that visit includes no changes. Current symptoms include none and have been stable.  Prior visit with dietician: no Current diet: in general, a "healthy" diet    Pertinent Labs:    Component Value Date/Time   CHOL 184 10/08/2017 1135   TRIG 87 10/08/2017 1135   CHOLHDL 3.5 10/08/2017 1135   CHOLHDL 3.3 01/06/2017 0643   CREATININE 0.89 10/08/2017 1135   CREATININE 0.87 10/29/2011 1553     Wt Readings from Last 3 Encounters:  07/02/19 191 lb (86.6 kg)  05/29/19 191 lb (86.6 kg)  11/27/17 184 lb (83.5 kg)    -----------------------------------------------------------------------------------------   Depression screen [facility] 2/9 07/02/2019 10/08/2017  Decreased Interest 0 0  Down, Depressed, Hopeless 1 3  PHQ - 2 Score 1 3  Altered sleeping 3 3  Tired, decreased energy 3 3  Change in appetite 0 0  Feeling bad or failure about yourself  0 0  Trouble concentrating 0 0  Moving slowly or fidgety/restless 0 0  Suicidal thoughts 0 0  PHQ-9 Score 7 9  Difficult doing work/chores Not difficult at all Not difficult at all    -----------------------------------------------------------------------------------------   Patient Active Problem List   Diagnosis Date Noted  . Vitamin D deficiency 07/02/2019  . Trochanteric bursitis of both hips 07/02/2019  . Impacted cerumen of right ear 07/02/2019  . Pure hypercholesterolemia 10/08/2017  . Hyperglycemia 10/08/2017  . Insomnia 03/23/2017  . Tobacco abuse disorder 01/15/2017  . Left hemiparesis ([place]) 01/06/2017  . HTN (hypertension) 01/06/2017  . CVA (cerebral vascular accident) ([place]) 01/06/2017   Past Surgical History:  Procedure Laterality Date  . ABDOMINAL SURGERY    . CARDIAC CATHETERIZATION    . CATARACT EXTRACTION W/PHACO Left 05/16/2017   Procedure: CATARACT EXTRACTION PHACO AND INTRAOCULAR LENS PLACEMENT ([place]) LEFT;  Surgeon: Leandrew Koyanagi, MD;  Location: [place];  Service: Ophthalmology;  Laterality: Left;  . CATARACT EXTRACTION W/PHACO Right 06/13/2017   Procedure: CATARACT EXTRACTION PHACO AND INTRAOCULAR LENS PLACEMENT ([place]) RIGHT;  Surgeon: Leandrew Koyanagi, MD;  Location: [place];  Service: Ophthalmology;  Laterality: Right;  sleep apnea  . CHOLECYSTECTOMY     Social History   Tobacco Use  . Smoking status: Current Every Day Smoker    Packs/day: 1.00    Years: 53.00     Pack years: 53.00    Types: Cigarettes  . Smokeless tobacco: Never Used  . Tobacco comment: Less than 1/2 PPD  Substance Use Topics  . Alcohol use: No  . Drug use: No   Allergies  Allergen Reactions  . Penicillins Other (See Comments)    chills       Medications: Outpatient Medications Prior to Visit  Medication Sig  . aspirin EC 325 MG tablet TAKE 1 TABLET BY MOUTH EVERY DAY  . atorvastatin (LIPITOR) 80 MG tablet Take 1 tablet (80 mg total) by mouth daily.  . cholecalciferol (VITAMIN D) 1000 units tablet Take 1,000 Units by mouth daily.  . fluticasone (FLONASE) 50 MCG/ACT nasal spray Place 2 sprays daily into both nostrils.  . hydrochlorothiazide (HYDRODIURIL) 12.5 MG tablet TAKE 1 TABLET BY MOUTH EVERY DAY  . meloxicam (MOBIC) 15 MG tablet TAKE 1 TABLET BY MOUTH EVERY DAY  . [DISCONTINUED] buPROPion (WELLBUTRIN) 75 MG tablet TAKE 2 TABLETS BY MOUTH DAILY FOR 3 DAYS, THEN 2 TABLETS TWO TIMES DAILY. (Patient not taking: Reported on 05/29/2019)  . [DISCONTINUED] metoprolol tartrate (  LOPRESSOR) 25 MG tablet Take 1 tablet (25 mg total) by mouth 2 (two) times daily. (Patient not taking: Reported on 05/29/2019)  . [DISCONTINUED] traZODone (DESYREL) 50 MG tablet Take 0.5-1 tablets (25-50 mg total) by mouth at bedtime as needed for sleep. (Patient not taking: Reported on 10/08/2017)   No facility-administered medications prior to visit.    Review of Systems  Constitutional: Negative.   Respiratory: Positive for shortness of breath. Negative for apnea, cough, choking, chest tightness, wheezing and stridor.   Cardiovascular: Negative.   Gastrointestinal: Positive for constipation (Occasionally). Negative for abdominal distention, abdominal pain, anal bleeding, blood in stool, diarrhea, nausea and rectal pain.  Musculoskeletal: Positive for arthralgias (Bilateral hip pain left worse than right.  Pt reports some improvement from last visit.). Negative for back pain, gait problem, joint swelling,  myalgias and neck pain.  Neurological: Positive for numbness (Left thigh). Negative for dizziness, light-headedness and headaches.    Last CBC Lab Results  Component Value Date   WBC 6.2 10/08/2017   HGB 14.0 10/08/2017   HCT 41.9 10/08/2017   MCV 89 10/08/2017   MCH 29.8 10/08/2017   RDW 13.2 10/08/2017   PLT 209 [id number]   Last metabolic panel Lab Results  Component Value Date   GLUCOSE 91 10/08/2017   NA 139 10/08/2017   K 4.0 10/08/2017   CL 99 10/08/2017   CO2 25 10/08/2017   BUN 15 10/08/2017   CREATININE 0.89 10/08/2017   GFRNONAA 67 10/08/2017   GFRAA 77 10/08/2017   CALCIUM 9.7 10/08/2017   PROT 6.7 10/08/2017   ALBUMIN 4.4 10/08/2017   LABGLOB 2.3 10/08/2017   AGRATIO 1.9 10/08/2017   BILITOT 0.7 10/08/2017   ALKPHOS 77 10/08/2017   AST 19 10/08/2017   ALT 14 10/08/2017   ANIONGAP 6 01/05/2017   Last lipids Lab Results  Component Value Date   CHOL 184 10/08/2017   HDL 53 10/08/2017   LDLCALC 114 (H) 10/08/2017   TRIG 87 10/08/2017   CHOLHDL 3.5 10/08/2017   Last hemoglobin A1c Lab Results  Component Value Date   HGBA1C 5.7 (H) 10/08/2017   Last vitamin D No results found for: 25OHVITD2, 25OHVITD3, VD25OH    Objective    BP 123/81 (BP Location: Left Arm, Patient Position: Sitting, Cuff Size: Normal)   Pulse 86   Temp (!) 96.6 F (35.9 C) (Temporal)   Wt 191 lb (86.6 kg)   BMI 32.79 kg/m  BP Readings from Last 3 Encounters:  07/02/19 123/81  05/29/19 133/86  10/08/17 124/78      Physical Exam Constitutional:      General: She is not in acute distress.    Appearance: Normal appearance.  HENT:     Head: Normocephalic and atraumatic.     Right Ear: External ear normal. There is impacted cerumen.     Left Ear: Tympanic membrane, ear canal and external ear normal. There is no impacted cerumen.  Eyes:     Conjunctiva/sclera: Conjunctivae normal.     Pupils: Pupils are equal, round, and reactive to light.  Cardiovascular:     Rate  and Rhythm: Normal rate and regular rhythm.     Pulses: Normal pulses.     Heart sounds: Normal heart sounds.  Pulmonary:     Effort: Pulmonary effort is normal. No respiratory distress.     Breath sounds: Normal breath sounds. No wheezing.  Abdominal:     Palpations: Abdomen is soft.  Musculoskeletal:     Cervical back: Neck  supple.     Right lower leg: No edema.     Left lower leg: No edema.     Comments: 4/5 strength on the left 5/5 strength on the right   Lymphadenopathy:     Cervical: No cervical adenopathy.  Skin:    General: Skin is warm and dry.     Findings: No rash.  Neurological:     Mental Status: She is alert and oriented to person, place, and time. Mental status is at baseline.  Psychiatric:        Mood and Affect: Mood normal.        Behavior: Behavior normal.      No results found for any visits on 07/02/19.  Assessment & Plan     Problem List Items Addressed This Visit      Cardiovascular and Mediastinum   HTN (hypertension) - Primary    Well controlled  No changes in medications  Recheck BMP today       Relevant Orders   Comprehensive metabolic panel   Lipid panel     Nervous and Auditory   Left hemiparesis (HCC)    Chronic and stable      Impacted cerumen of right ear    Discussed using OTC Debrox in right ear.         Musculoskeletal and Integument   Trochanteric bursitis of both hips    Left worse than right.  Hip pain improved from last visit Pt reports numbness that comes and goes in her left thigh - if persists, consider neurology referral         Other   Tobacco abuse disorder    Has tried nicotine patches in the past with no success. Pt motivated to quit smoking.  Has not tried Wellbutrin yet.  Pt agreed start it now.  3-5 min discussion regarding the harms of continuing to smoke and the benefits of cessation      Pure hypercholesterolemia    Tolerating Atorvastatin 80mg  well Recheck labs today.        Relevant  Orders   Lipid panel   Hyperglycemia    Pre-diabetic range  Discussed importance of diet and exercise to avoid going becoming diabetic.       Relevant Orders   Hemoglobin A1c   Vitamin D deficiency    Recheck labs today.       Relevant Orders   Vitamin D (25 hydroxy)        Return in about 3 months (around 10/01/2019) for For CPE and follow up on Tobacco abuse.Lajuana Matte, MD, have reviewed all documentation for this visit. The documentation on 07/02/19 for the exam, diagnosis, procedures, and orders are all accurate and complete.   Greely Atiyeh, [name], MD, MPH [facility]

## 2019-07-02 NOTE — Patient Instructions (Signed)
Try OTC Debrox for right ear.     Tobacco Use Disorder Tobacco use disorder (TUD) occurs when a person craves, seeks, and uses tobacco, regardless of the consequences. This disorder can cause problems with mental and physical health. It can affect your ability to have healthy relationships, and it can keep you from meeting your responsibilities at work, home, or school. Tobacco may be:  Smoked as a cigarette or cigar.  Inhaled using e-cigarettes.  Smoked in a pipe or hookah.  Chewed as smokeless tobacco.  Inhaled into the nostrils as snuff. Tobacco products contain a dangerous chemical called nicotine, which is very addictive. Nicotine triggers hormones that make the body feel stimulated and works on areas of the brain that make you feel good. These effects can make it hard for people to quit nicotine. Tobacco contains many other unsafe chemicals that can damage almost every organ in the body. Smoking tobacco also puts others in danger due to fire risk and possible health problems caused by breathing in secondhand smoke. What are the signs or symptoms? Symptoms of TUD may include:  Being unable to slow down or stop your tobacco use.  Spending an abnormal amount of time getting or using tobacco.  Craving tobacco. Cravings may last for up to 6 months after quitting.  Tobacco use that: ? Interferes with your work, school, or home life. ? Interferes with your personal and social relationships. ? Makes you give up activities that you once enjoyed or found important.  Using tobacco even though you know that it is: ? Dangerous or bad for your health or someone else's health. ? Causing problems in your life.  Needing more and more of the substance to get the same effect (developing tolerance).  Experiencing unpleasant symptoms if you do not use the substance (withdrawal). Withdrawal symptoms may include: ? Depressed, anxious, or irritable mood. ? Difficulty  concentrating. ? Increased appetite. ? Restlessness or trouble sleeping.  Using the substance to avoid withdrawal. How is this diagnosed? This condition may be diagnosed based on:  Your current and past tobacco use. Your health care provider may ask questions about how your tobacco use affects your life.  A physical exam. You may be diagnosed with TUD if you have at least two symptoms within a 50-month period. How is this treated? This condition is treated by stopping tobacco use. Many people are unable to quit on their own and need help. Treatment may include:  Nicotine replacement therapy (NRT). NRT provides nicotine without the other harmful chemicals in tobacco. NRT gradually lowers the dosage of nicotine in the body and reduces withdrawal symptoms. NRT is available as: ? Over-the-counter gums, lozenges, and skin patches. ? Prescription mouth inhalers and nasal sprays.  Medicine that acts on the brain to reduce cravings and withdrawal symptoms.  A type of talk therapy that examines your triggers for tobacco use, how to avoid them, and how to cope with cravings (behavioral therapy).  Hypnosis. This may help with withdrawal symptoms.  Joining a support group for others coping with TUD. The best treatment for TUD is usually a combination of medicine, talk therapy, and support groups. Recovery can be a long process. Many people start using tobacco again after stopping (relapse). If you relapse, it does not mean that treatment will not work. Follow these instructions at home:  Lifestyle  Do not use any products that contain nicotine or tobacco, such as cigarettes and e-cigarettes.  Avoid things that trigger tobacco use as much as you  can. Triggers include people and situations that usually cause you to use tobacco.  Avoid drinks that contain caffeine, including coffee. These may worsen some withdrawal symptoms.  Find ways to manage stress. Wanting to smoke may cause stress, and  stress can make you want to smoke. Relaxation techniques such as deep breathing, meditation, and yoga may help.  Attend support groups as needed. These groups are an important part of long-term recovery for many people. General instructions  Take over-the-counter and prescription medicines only as told by your health care provider.  Check with your health care provider before taking any new prescription or over-the-counter medicines.  Decide on a friend, family member, or smoking quit-line (such as 1-800-QUIT-NOW in the [place] that you can call or text when you feel the urge to smoke or when you need help coping with cravings.  Keep all follow-up visits as told by your health care provider and therapist. This is important. Contact a health care provider if:  You are not able to take your medicines as prescribed.  Your symptoms get worse, even with treatment. Summary  Tobacco use disorder (TUD) occurs when a person craves, seeks, and uses tobacco regardless of the consequences.  This condition may be diagnosed based on your current and past tobacco use and a physical exam.  Many people are unable to quit on their own and need help. Recovery can be a long process.  The most effective treatment for TUD is usually a combination of medicine, talk therapy, and support groups. This information is not intended to replace advice given to you by your health care provider. Make sure you discuss any questions you have with your health care provider. Document Revised: 02/07/2017 Document Reviewed: 02/07/2017 Elsevier Patient Education  2020 [employer].

## 2019-07-03 ENCOUNTER — Telehealth: Payer: Self-pay

## 2019-07-03 ENCOUNTER — Encounter: Payer: Self-pay | Admitting: Family Medicine

## 2019-07-03 ENCOUNTER — Other Ambulatory Visit: Payer: Self-pay

## 2019-07-03 DIAGNOSIS — E7800 Pure hypercholesterolemia, unspecified: Secondary | ICD-10-CM

## 2019-07-03 LAB — COMPREHENSIVE METABOLIC PANEL WITH GFR
ALT: 10 [IU]/L (ref 0–32)
AST: 18 [IU]/L (ref 0–40)
Albumin/Globulin Ratio: 1.6 (ref 1.2–2.2)
Albumin: 4 g/dL (ref 3.8–4.8)
Alkaline Phosphatase: 82 [IU]/L (ref 39–117)
BUN/Creatinine Ratio: 18 (ref 12–28)
BUN: 17 mg/dL (ref 8–27)
Bilirubin Total: 0.5 mg/dL (ref 0.0–1.2)
CO2: 26 mmol/L (ref 20–29)
Calcium: 9.9 mg/dL (ref 8.7–10.3)
Chloride: 101 mmol/L (ref 96–106)
Creatinine, Ser: 0.92 mg/dL (ref 0.57–1.00)
GFR calc Af Amer: 73 mL/min/{1.73_m2}
GFR calc non Af Amer: 63 mL/min/{1.73_m2}
Globulin, Total: 2.5 g/dL (ref 1.5–4.5)
Glucose: 93 mg/dL (ref 65–99)
Potassium: 3.7 mmol/L (ref 3.5–5.2)
Sodium: 141 mmol/L (ref 134–144)
Total Protein: 6.5 g/dL (ref 6.0–8.5)

## 2019-07-03 LAB — HEMOGLOBIN A1C
Est. average glucose Bld gHb Est-mCnc: 111 mg/dL
Hgb A1c MFr Bld: 5.5 % (ref 4.8–5.6)

## 2019-07-03 LAB — LIPID PANEL
Chol/HDL Ratio: 3.5 ratio (ref 0.0–4.4)
Cholesterol, Total: 174 mg/dL (ref 100–199)
HDL: 50 mg/dL
LDL Chol Calc (NIH): 99 mg/dL (ref 0–99)
Triglycerides: 143 mg/dL (ref 0–149)
VLDL Cholesterol Cal: 25 mg/dL (ref 5–40)

## 2019-07-03 LAB — VITAMIN D 25 HYDROXY (VIT D DEFICIENCY, FRACTURES): Vit D, 25-Hydroxy: 20.7 ng/mL — ABNORMAL LOW (ref 30.0–100.0)

## 2019-07-03 MED ORDER — ATORVASTATIN CALCIUM 80 MG PO TABS: 80.0000 mg | ORAL_TABLET | Freq: Every day | ORAL | 3 refills | Status: DC

## 2019-07-03 NOTE — Telephone Encounter (Signed)
-----   Message from Virginia Crews, MD sent at 07/03/2019  8:00 AM EDT ----- Normal kidney function, liver function, electrolytes.  Cholesterol is not quite at goal -we want LDL less than 70 given previous stroke.  Is she taking atorvastatin regularly?  If not, recommend resuming it daily, but if she is then we may consider adding Zetia to lower cholesterol and decrease risk of future stroke.  A1c is back in the normal range.  Vitamin D is low.  Recommend 1000 to 2000 units of over-the-counter vitamin D3 daily.

## 2019-07-03 NOTE — Telephone Encounter (Signed)
Pt advised.  She is not taking atorvastatin regularly but will start today.    Thanks,   -Mickel Baas

## 2019-07-15 ENCOUNTER — Other Ambulatory Visit
Admission: RE | Admit: 2019-07-15 | Discharge: 2019-07-15 | Disposition: A | Discharge status: Home / Self Care | Payer: Federal, State, Local not specified - PPO | Source: Ambulatory Visit | Attending: Family Medicine | Admitting: Family Medicine

## 2019-07-15 DIAGNOSIS — Z01812 Encounter for preprocedural laboratory examination: Secondary | ICD-10-CM | POA: Diagnosis present

## 2019-07-15 DIAGNOSIS — Z20822 Contact with and (suspected) exposure to covid-19: Secondary | ICD-10-CM | POA: Diagnosis not present

## 2019-07-15 LAB — SARS CORONAVIRUS 2 (TAT 6-24 HRS): SARS Coronavirus 2: NEGATIVE

## 2019-07-16 ENCOUNTER — Telehealth: Payer: Self-pay

## 2019-07-16 NOTE — Telephone Encounter (Signed)
-----   Message from Virginia Crews, MD sent at 07/16/2019  8:04 AM EDT ----- COVID test negative

## 2019-07-16 NOTE — Telephone Encounter (Signed)
Patient advised.

## 2019-07-24 ENCOUNTER — Ambulatory Visit: Payer: Federal, State, Local not specified - PPO | Attending: Neurology

## 2019-07-24 DIAGNOSIS — G4733 Obstructive sleep apnea (adult) (pediatric): Secondary | ICD-10-CM | POA: Insufficient documentation

## 2019-07-28 ENCOUNTER — Other Ambulatory Visit: Payer: Self-pay

## 2019-07-29 ENCOUNTER — Encounter: Payer: Self-pay | Admitting: Family Medicine

## 2019-07-30 NOTE — Telephone Encounter (Signed)
I'm not sure we can do anything about appointments that show as no shows outside of our office ????

## 2019-08-08 ENCOUNTER — Encounter: Payer: Self-pay | Admitting: Family Medicine

## 2019-08-08 NOTE — Progress Notes (Signed)
Patient's sleep study results received in our office and reviewed today.  Study was completed on 07/24/2019.  She had a prior history of OSA but had not been on CPAP for years.  She was found to have severe OSA with total AHI of 62.1 events per hour.  This was worse during REM sleep with an AHI of 96/h.  Her sleep efficiency was reduced at 69.1% and there was no slow-wave sleep.  CPAP titration study was recommended.  She was also recommended to lose weight and maintain an ideal body weight, avoid sleeping in the supine position, avoid bedtime alcohol and sedatives, avoid driving while drowsy.  Order was already signed for CPAP titration study.  Please let patient know about results of her sleep study.  Petrea Fredenburg, [name], MD, MPH [facility]

## 2019-08-08 NOTE — Progress Notes (Signed)
Patient advised of sleep study results.

## 2019-08-16 ENCOUNTER — Encounter: Payer: Self-pay | Admitting: Family Medicine

## 2019-08-21 ENCOUNTER — Telehealth: Payer: Self-pay

## 2019-08-21 ENCOUNTER — Encounter: Payer: Self-pay | Admitting: Family Medicine

## 2019-08-21 NOTE — Telephone Encounter (Signed)
Please see the mychart message from the patient on 6/14 regarding a letter she will need.  Thanks

## 2019-08-22 ENCOUNTER — Telehealth (INDEPENDENT_AMBULATORY_CARE_PROVIDER_SITE_OTHER): Payer: Federal, State, Local not specified - PPO | Admitting: Physician Assistant

## 2019-08-22 ENCOUNTER — Encounter: Payer: Self-pay | Admitting: Physician Assistant

## 2019-08-22 VITALS — Temp 97.5°F

## 2019-08-22 DIAGNOSIS — J0140 Acute pansinusitis, unspecified: Secondary | ICD-10-CM

## 2019-08-22 DIAGNOSIS — R05 Cough: Secondary | ICD-10-CM

## 2019-08-22 DIAGNOSIS — R059 Cough, unspecified: Secondary | ICD-10-CM

## 2019-08-22 MED ORDER — AZITHROMYCIN 250 MG PO TABS: ORAL_TABLET | ORAL | 0 refills | Status: DC

## 2019-08-22 MED ORDER — BENZONATATE 200 MG PO CAPS: 200.0000 mg | ORAL_CAPSULE | Freq: Three times a day (TID) | ORAL | 0 refills | Status: DC | PRN

## 2019-08-22 MED ORDER — PREDNISONE 10 MG (21) PO TBPK: ORAL_TABLET | ORAL | 0 refills | Status: DC

## 2019-08-22 NOTE — Progress Notes (Signed)
MyChart Video Visit    Virtual Visit via Video Note   This visit type was conducted due to national recommendations for restrictions regarding the COVID-19 Pandemic (e.g. social distancing) in an effort to limit this patient's exposure and mitigate transmission in our community. This patient is at least at moderate risk for complications without adequate follow up. This format is felt to be most appropriate for this patient at this time. Physical exam was limited by quality of the video and audio technology used for the visit.   Patient location: Home Provider location: [facility]   Patient: Angel Lane   DOB: Jul 18, 1950   69 y.o. Female  MRN: [id number] Visit Date: 08/22/2019  Today's healthcare provider: Mar Daring, PA-C   Chief Complaint  Patient presents with  . Sinusitis   Subjective    Sinusitis This is a new problem. The current episode started 1 to 4 weeks ago. The problem has been gradually worsening since onset. There has been no fever. She is experiencing no pain. Associated symptoms include congestion, coughing, sinus pressure and sneezing. Pertinent negatives include no chills, ear pain, headaches, shortness of breath or sore throat. Past treatments include saline sprays (Flonase and coricidin and allegra). The treatment provided no relief.    Patient Active Problem List   Diagnosis Date Noted  . Vitamin D deficiency 07/02/2019  . Trochanteric bursitis of both hips 07/02/2019  . Impacted cerumen of right ear 07/02/2019  . Pure hypercholesterolemia 10/08/2017  . Hyperglycemia 10/08/2017  . Insomnia 03/23/2017  . Tobacco abuse disorder 01/15/2017  . Left hemiparesis ([place]) 01/06/2017  . HTN (hypertension) 01/06/2017  . CVA (cerebral vascular accident) ([place]) 01/06/2017   Past Medical History:  Diagnosis Date  . Dyspnea    due to stroke  . Hypertension   . 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 Sleep apnea   . Stroke [facility])     November 2018       Medications: Outpatient Medications Prior to Visit  Medication Sig  . aspirin EC 325 MG tablet TAKE 1 TABLET BY MOUTH EVERY DAY  . atorvastatin (LIPITOR) 80 MG tablet Take 1 tablet (80 mg total) by mouth daily.  . cholecalciferol (VITAMIN D) 1000 units tablet Take 1,000 Units by mouth daily.  . fluticasone (FLONASE) 50 MCG/ACT nasal spray Place 2 sprays daily into both nostrils.  . hydrochlorothiazide (HYDRODIURIL) 12.5 MG tablet TAKE 1 TABLET BY MOUTH EVERY DAY  . meloxicam (MOBIC) 15 MG tablet TAKE 1 TABLET BY MOUTH EVERY DAY  . buPROPion (WELLBUTRIN) 75 MG tablet TAKE 2 TABLETS BY MOUTH DAILY FOR 3 DAYS, THEN 2 TABLETS TWO TIMES DAILY. (Patient not taking: Reported on 08/22/2019)   No facility-administered medications prior to visit.    Review of Systems  Constitutional: Negative for chills, fatigue and fever.  HENT: Positive for congestion, postnasal drip, rhinorrhea, sinus pressure, sinus pain and sneezing. Negative for ear pain and sore throat.   Respiratory: Positive for cough. Negative for chest tightness, shortness of breath and wheezing.   Cardiovascular: Negative for chest pain, palpitations and leg swelling.  Neurological: Negative for headaches.    Last CBC Lab Results  Component Value Date   WBC 6.2 10/08/2017   HGB 14.0 10/08/2017   HCT 41.9 10/08/2017   MCV 89 10/08/2017   MCH 29.8 10/08/2017   RDW 13.2 10/08/2017   PLT 209 00/86/7619   Last metabolic panel Lab Results  Component Value Date  GLUCOSE 93 07/02/2019   NA 141 07/02/2019   K 3.7 07/02/2019   CL 101 07/02/2019   CO2 26 07/02/2019   BUN 17 07/02/2019   CREATININE 0.92 07/02/2019   GFRNONAA 63 07/02/2019   GFRAA 73 07/02/2019   CALCIUM 9.9 07/02/2019   PROT 6.5 07/02/2019   ALBUMIN 4.0 07/02/2019   LABGLOB 2.5 07/02/2019   AGRATIO 1.6 07/02/2019   BILITOT 0.5 07/02/2019   ALKPHOS 82 07/02/2019   AST 18 07/02/2019   ALT 10 07/02/2019   ANIONGAP 6 01/05/2017       Objective    Temp (!) 97.5 F (36.4 C) (Temporal)  BP Readings from Last 3 Encounters:  07/02/19 123/81  05/29/19 133/86  10/08/17 124/78   Wt Readings from Last 3 Encounters:  07/02/19 191 lb (86.6 kg)  05/29/19 191 lb (86.6 kg)  11/27/17 184 lb (83.5 kg)      Physical Exam Vitals reviewed.  Constitutional:      General: She is not in acute distress.    Appearance: Normal appearance. She is well-developed. She is not ill-appearing.  HENT:     Head: Normocephalic and atraumatic.     Comments: Patient reports tenderness over frontal lobes bilaterally, ethmoid sinuses bilaterally, and right maxillary sinus Pulmonary:     Effort: Pulmonary effort is normal. No respiratory distress.  Musculoskeletal:     Cervical back: Normal range of motion and neck supple.  Neurological:     Mental Status: She is alert.  Psychiatric:        Mood and Affect: Mood normal.        Behavior: Behavior normal.        Thought Content: Thought content normal.        Judgment: Judgment normal.        Assessment & Plan     1. Acute non-recurrent pansinusitis Worsening symptoms that have not responded to OTC medications. Will give Zpak and prednisone as below. Continue allergy medications. Stay well hydrated and get plenty of rest. Call if no symptom improvement or if symptoms worsen. - azithromycin (ZITHROMAX) 250 MG tablet; Take 2 tablets PO on day one, and one tablet PO daily thereafter until completed.  Dispense: 6 tablet; Refill: 0 - benzonatate (TESSALON) 200 MG capsule; Take 1 capsule (200 mg total) by mouth 3 (three) times daily as needed.  Dispense: 30 capsule; Refill: 0 - predniSONE (STERAPRED UNI-PAK 21 TAB) 10 MG (21) TBPK tablet; 6 day taper; take as directed on package instructions  Dispense: 21 tablet; Refill: 0  2. Cough Tessalon perles for cough. Push fluids.  - benzonatate (TESSALON) 200 MG capsule; Take 1 capsule (200 mg total) by mouth 3 (three) times daily as needed.   Dispense: 30 capsule; Refill: 0   No follow-ups on file.     I discussed the assessment and treatment plan with the patient. The patient was provided an opportunity to ask questions and all were answered. The patient agreed with the plan and demonstrated an understanding of the instructions.   The patient was advised to call back or seek an in-person evaluation if the symptoms worsen or if the condition fails to improve as anticipated.  I provided 8 minutes of non-face-to-face time during this encounter.  Reynolds Bowl, PA-C, have reviewed all documentation for this visit. The documentation on 08/22/19 for the exam, diagnosis, procedures, and orders are all accurate and complete.  [place] [facility] [phone] (phone) [phone] (fax)  [facility]

## 2019-08-22 NOTE — Patient Instructions (Signed)

## 2019-08-25 ENCOUNTER — Encounter: Payer: Self-pay | Admitting: Family Medicine

## 2019-09-05 ENCOUNTER — Telehealth: Payer: Self-pay | Admitting: Family Medicine

## 2019-09-05 NOTE — Telephone Encounter (Signed)
Spoke with pt regarding her CPAP machine.  Patient hasn't heard back on that since early June.   Right now she is still waiting for a call to know the status due to her sleep apnea.  Patient is requesting a call back to let her know the status asap.  Thanks, [employer]

## 2019-09-09 ENCOUNTER — Encounter: Payer: Self-pay | Admitting: Family Medicine

## 2019-09-09 NOTE — Telephone Encounter (Signed)
Can you see sleep study was performed ? Orders received from sleep study lab for review ?

## 2019-09-10 NOTE — Telephone Encounter (Signed)
LMTCB 09/10/2019.  PEC please see if she had a sleep study and where.    Thanks,   -Mickel Baas

## 2019-09-10 NOTE — Telephone Encounter (Signed)
Patient returned call and states she had her [facility] done at [facility] on 07/29/2019

## 2019-09-11 NOTE — Telephone Encounter (Signed)
I have not seen any results. Please call [facility] and have them fax her results and advise patinet we are getting them sent to [place]. Unless they are in Dr. Sharmaine Base box please place in mine and I will review.

## 2019-09-12 NOTE — Telephone Encounter (Signed)
Do not see results in her chart

## 2019-09-12 NOTE — Telephone Encounter (Signed)
Per Sharyn Lull from [facility] sleep study and order that needed to be signed was faxed to [facility]. She states she will refax

## 2019-09-12 NOTE — Telephone Encounter (Signed)
Forms received and is in Jenni's box. Per Rolley Sims.

## 2019-09-16 NOTE — Telephone Encounter (Signed)
I didn't see an order in Dr. Sharmaine Base box. I haven't seen an order come via fax. TNP

## 2019-09-18 NOTE — Telephone Encounter (Signed)
The order was signed by [facility]. It was faxed to [facility] on 09/15/2019 and was re-faxed today. The order has also been scanned into pt's chart. Thanks TNP

## 2019-10-01 ENCOUNTER — Encounter: Payer: Federal, State, Local not specified - PPO | Admitting: Family Medicine

## 2019-10-02 ENCOUNTER — Encounter: Payer: Self-pay | Admitting: *Deleted

## 2019-10-15 ENCOUNTER — Encounter: Payer: Self-pay | Admitting: Family Medicine

## 2019-10-16 ENCOUNTER — Ambulatory Visit: Payer: Federal, State, Local not specified - PPO | Attending: Otolaryngology

## 2019-10-16 DIAGNOSIS — G4733 Obstructive sleep apnea (adult) (pediatric): Secondary | ICD-10-CM | POA: Insufficient documentation

## 2019-10-21 ENCOUNTER — Other Ambulatory Visit: Payer: Self-pay

## 2019-10-24 ENCOUNTER — Encounter: Payer: Self-pay | Admitting: Family Medicine

## 2019-10-24 NOTE — Progress Notes (Signed)
CPAP Titration study results received and reviewed.  During her prior PSG, she was found to have severe OSA with a total AHI of 62.1/h, worse during REM sleep with an AHI of 96/h.  CPAP was titrated to 12 cm of water with good control of events.  Recommend CPAP therapy with pressure setting of 12 cm of water with heated humidity and FNP Simplus full facemask size medium.  Also recommended to lose weight and maintain ideal body weight, avoid bedtime alcohol and sedatives, avoid driving while feeling drowsy, avoid sleeping in supine position.  Prescription for CPAP and supplies will be sent to DME servicer of patient's choice along with results from the study.  Please reach out to patient to see if she has a preference and let her know to be expecting this  [place], [name], MD, MPH [place]

## 2019-10-24 NOTE — Progress Notes (Signed)
Pt advised.  The order for her CPAP went to [facility].  Fax number:  [phone].

## 2019-11-03 ENCOUNTER — Telehealth: Payer: Self-pay | Admitting: Family Medicine

## 2019-11-03 NOTE — Telephone Encounter (Signed)
Called to inform the office that the order they received for a cpap was illegible and was cut off when it was sent over.  Would like it recent for patient.  CB# [phone]

## 2019-11-03 NOTE — Telephone Encounter (Signed)
Order has been faxed again today.   Thanks,   -Mickel Baas

## 2019-11-05 NOTE — Telephone Encounter (Signed)
I spoke with Estill Bamberg at [facility].  They did receive the order and are processing it.   Thanks,   -Mickel Baas

## 2019-11-17 ENCOUNTER — Encounter: Payer: Self-pay | Admitting: Family Medicine

## 2019-11-17 NOTE — Telephone Encounter (Signed)
Fine with me. Thanks!  Happy to sign new Rx if needed.

## 2019-12-17 ENCOUNTER — Encounter: Payer: Self-pay | Admitting: Family Medicine

## 2019-12-18 NOTE — Telephone Encounter (Signed)
[facility] states that CPAP machine is on back order. Patient choose to go with the remote set up and that department is behind also.

## 2019-12-18 NOTE — Telephone Encounter (Signed)
I think we sent this to [facility].  Can we call and check on it?

## 2020-01-01 ENCOUNTER — Other Ambulatory Visit: Payer: Self-pay | Admitting: Family Medicine

## 2020-01-05 MED ORDER — HYDROCHLOROTHIAZIDE 12.5 MG PO TABS: 12.5000 mg | ORAL_TABLET | Freq: Every day | ORAL | 0 refills | Status: DC

## 2020-01-15 ENCOUNTER — Encounter: Payer: Self-pay | Admitting: Family Medicine

## 2020-01-16 MED ORDER — ASPIRIN EC 325 MG PO TBEC: 325.0000 mg | DELAYED_RELEASE_TABLET | Freq: Every day | ORAL | 3 refills | Status: DC

## 2020-01-19 ENCOUNTER — Other Ambulatory Visit: Payer: Self-pay

## 2020-01-19 ENCOUNTER — Other Ambulatory Visit
Admission: RE | Admit: 2020-01-19 | Discharge: 2020-01-19 | Disposition: A | Discharge status: Home / Self Care | Payer: Federal, State, Local not specified - PPO | Source: Ambulatory Visit | Attending: Internal Medicine | Admitting: Internal Medicine

## 2020-01-19 DIAGNOSIS — K642 Third degree hemorrhoids: Secondary | ICD-10-CM | POA: Diagnosis not present

## 2020-01-19 DIAGNOSIS — Z01812 Encounter for preprocedural laboratory examination: Secondary | ICD-10-CM | POA: Insufficient documentation

## 2020-01-19 DIAGNOSIS — D122 Benign neoplasm of ascending colon: Secondary | ICD-10-CM | POA: Diagnosis not present

## 2020-01-19 DIAGNOSIS — Z20822 Contact with and (suspected) exposure to covid-19: Secondary | ICD-10-CM | POA: Insufficient documentation

## 2020-01-19 DIAGNOSIS — K644 Residual hemorrhoidal skin tags: Secondary | ICD-10-CM | POA: Diagnosis not present

## 2020-01-19 DIAGNOSIS — Z1211 Encounter for screening for malignant neoplasm of colon: Secondary | ICD-10-CM | POA: Diagnosis present

## 2020-01-19 DIAGNOSIS — Z79899 Other long term (current) drug therapy: Secondary | ICD-10-CM | POA: Diagnosis not present

## 2020-01-19 DIAGNOSIS — D123 Benign neoplasm of transverse colon: Secondary | ICD-10-CM | POA: Diagnosis not present

## 2020-01-19 DIAGNOSIS — Z7982 Long term (current) use of aspirin: Secondary | ICD-10-CM | POA: Diagnosis not present

## 2020-01-19 DIAGNOSIS — Z791 Long term (current) use of non-steroidal anti-inflammatories (NSAID): Secondary | ICD-10-CM | POA: Diagnosis not present

## 2020-01-19 DIAGNOSIS — Z880 Allergy status to penicillin: Secondary | ICD-10-CM | POA: Diagnosis not present

## 2020-01-19 DIAGNOSIS — K641 Second degree hemorrhoids: Secondary | ICD-10-CM | POA: Diagnosis not present

## 2020-01-19 DIAGNOSIS — K5730 Diverticulosis of large intestine without perforation or abscess without bleeding: Secondary | ICD-10-CM | POA: Diagnosis not present

## 2020-01-20 ENCOUNTER — Encounter: Payer: Self-pay | Admitting: Internal Medicine

## 2020-01-20 LAB — SARS CORONAVIRUS 2 (TAT 6-24 HRS): SARS Coronavirus 2: NEGATIVE

## 2020-01-21 ENCOUNTER — Ambulatory Visit: Payer: Federal, State, Local not specified - PPO | Admitting: Anesthesiology

## 2020-01-21 ENCOUNTER — Encounter: Payer: Self-pay | Admitting: Internal Medicine

## 2020-01-21 ENCOUNTER — Encounter
Admission: RE | Disposition: A | Discharge status: Home / Self Care | Payer: Self-pay | Source: Home / Self Care | Attending: Internal Medicine

## 2020-01-21 ENCOUNTER — Ambulatory Visit
Admission: RE | Admit: 2020-01-21 | Discharge: 2020-01-21 | Disposition: A | Discharge status: Home / Self Care | Payer: Federal, State, Local not specified - PPO | Attending: Internal Medicine | Admitting: Internal Medicine

## 2020-01-21 DIAGNOSIS — Z1211 Encounter for screening for malignant neoplasm of colon: Secondary | ICD-10-CM | POA: Diagnosis not present

## 2020-01-21 DIAGNOSIS — Z880 Allergy status to penicillin: Secondary | ICD-10-CM | POA: Insufficient documentation

## 2020-01-21 DIAGNOSIS — D122 Benign neoplasm of ascending colon: Secondary | ICD-10-CM | POA: Insufficient documentation

## 2020-01-21 DIAGNOSIS — D123 Benign neoplasm of transverse colon: Secondary | ICD-10-CM | POA: Insufficient documentation

## 2020-01-21 DIAGNOSIS — K641 Second degree hemorrhoids: Secondary | ICD-10-CM | POA: Insufficient documentation

## 2020-01-21 DIAGNOSIS — Z7982 Long term (current) use of aspirin: Secondary | ICD-10-CM | POA: Insufficient documentation

## 2020-01-21 DIAGNOSIS — Z20822 Contact with and (suspected) exposure to covid-19: Secondary | ICD-10-CM | POA: Insufficient documentation

## 2020-01-21 DIAGNOSIS — Z791 Long term (current) use of non-steroidal anti-inflammatories (NSAID): Secondary | ICD-10-CM | POA: Insufficient documentation

## 2020-01-21 DIAGNOSIS — K644 Residual hemorrhoidal skin tags: Secondary | ICD-10-CM | POA: Insufficient documentation

## 2020-01-21 DIAGNOSIS — K642 Third degree hemorrhoids: Secondary | ICD-10-CM | POA: Insufficient documentation

## 2020-01-21 DIAGNOSIS — Z79899 Other long term (current) drug therapy: Secondary | ICD-10-CM | POA: Insufficient documentation

## 2020-01-21 DIAGNOSIS — K5730 Diverticulosis of large intestine without perforation or abscess without bleeding: Secondary | ICD-10-CM | POA: Insufficient documentation

## 2020-01-21 HISTORY — DX: Depression, unspecified: F32.A

## 2020-01-21 HISTORY — PX: COLONOSCOPY: SHX5424

## 2020-01-21 HISTORY — DX: Anemia, unspecified: D64.9

## 2020-01-21 SURGERY — COLONOSCOPY
Anesthesia: General

## 2020-01-21 MED ORDER — LIDOCAINE HCL (CARDIAC) PF 100 MG/5ML IV SOSY
PREFILLED_SYRINGE | INTRAVENOUS | Status: DC | PRN
  Administered 2020-01-21: 50 mg via INTRAVENOUS

## 2020-01-21 MED ORDER — PROPOFOL 10 MG/ML IV BOLUS
INTRAVENOUS | Status: DC | PRN
  Administered 2020-01-21: 80 mg via INTRAVENOUS

## 2020-01-21 MED ORDER — SODIUM CHLORIDE 0.9 % IV SOLN
INTRAVENOUS | Status: DC
  Administered 2020-01-21: 20 mL/h via INTRAVENOUS

## 2020-01-21 MED ORDER — PROPOFOL 500 MG/50ML IV EMUL
INTRAVENOUS | Status: DC | PRN
  Administered 2020-01-21: 130 ug/kg/min via INTRAVENOUS

## 2020-01-21 MED ORDER — LIDOCAINE HCL (PF) 2 % IJ SOLN
INTRAMUSCULAR | Status: AC
  Filled 2020-01-21: qty 5

## 2020-01-21 MED ORDER — PROPOFOL 500 MG/50ML IV EMUL
INTRAVENOUS | Status: AC
  Filled 2020-01-21: qty 50

## 2020-01-21 NOTE — Interval H&P Note (Signed)
History and Physical Interval Note:  01/21/2020 9:31 AM  Angel Lane  has presented today for surgery, with the diagnosis of COLON CANCER SCREENING.  The various methods of treatment have been discussed with the patient and family. After consideration of risks, benefits and other options for treatment, the patient has consented to  Procedure(s): COLONOSCOPY (N/A) as a surgical intervention.  The patient's history has been reviewed, patient examined, no change in status, stable for surgery.  I have reviewed the patient's chart and labs.  Questions were answered to the patient's satisfaction.     [place], [place]

## 2020-01-21 NOTE — Transfer of Care (Signed)
Immediate Anesthesia Transfer of Care Note  Patient: MOZETTA MURFIN  Procedure(s) Performed: COLONOSCOPY (N/A )  Patient Location: PACU and Endoscopy Unit  Anesthesia Type:General  Level of Consciousness: drowsy  Airway & Oxygen Therapy: Patient Spontanous Breathing and Patient connected to nasal cannula oxygen  Post-op Assessment: Report given to RN and Post -op Vital signs reviewed and stable  Post vital signs: Reviewed and stable  Last Vitals:  Vitals Value Taken Time  BP 103/61 01/21/20 0949  Temp 36.6 C 01/21/20 0948  Pulse 74 01/21/20 0950  Resp 25 01/21/20 0950  SpO2 95 % 01/21/20 0950  Vitals shown include unvalidated device data.  Last Pain:  Vitals:   01/21/20 0948  TempSrc: Temporal  PainSc: Asleep         Complications: No complications documented.

## 2020-01-21 NOTE — Anesthesia Preprocedure Evaluation (Signed)
Anesthesia Evaluation  Patient identified by MRN, date of birth, ID band Patient awake    Reviewed: Allergy & Precautions, NPO status , Patient's Chart, lab work & pertinent test results  History of Anesthesia Complications Negative for: history of anesthetic complications  Airway Mallampati: III       Dental  (+) Partial Upper   Pulmonary sleep apnea (has not received CPAP yet) , neg COPD, Current Smoker,           Cardiovascular hypertension, Pt. on medications and Pt. on home beta blockers (-) Past MI and (-) CHF (-) dysrhythmias (-) Valvular Problems/Murmurs     Neuro/Psych neg Seizures Depression CVA (mild L sided weakness, resolved speech difficulties), Residual Symptoms    GI/Hepatic Neg liver ROS, neg GERD  ,  Endo/Other  neg diabetes  Renal/GU negative Renal ROS     Musculoskeletal   Abdominal   Peds  Hematology  (+) anemia ,   Anesthesia Other Findings   Reproductive/Obstetrics                             Anesthesia Physical Anesthesia Plan  ASA: III  Anesthesia Plan: General   Post-op Pain Management:    Induction: Intravenous  PONV Risk Score and Plan: 2 and Treatment may vary due to age or medical condition  Airway Management Planned: Nasal Cannula  Additional Equipment:   Intra-op Plan:   Post-operative Plan:   Informed Consent: I have reviewed the patients History and Physical, chart, labs and discussed the procedure including the risks, benefits and alternatives for the proposed anesthesia with the patient or authorized representative who has indicated his/her understanding and acceptance.       Plan Discussed with:   Anesthesia Plan Comments:         Anesthesia Quick Evaluation

## 2020-01-21 NOTE — H&P (Signed)
Outpatient short stay form Pre-procedure 01/21/2020 9:30 AM Promiss Labarbera K. Alice Reichert, M.D.  Primary Physician:  Lavon Paganini, M.D.  Reason for visit: Colon cancer screening  History of present illness:  Patient presents for colonoscopy for colon cancer screening. The patient denies complaints of abdominal pain, significant change in bowel habits, or rectal bleeding.      Current Facility-Administered Medications:  .  0.9 %  sodium chloride infusion, , Intravenous, Continuous, [place], Benay Pike, MD, Last Rate: 20 mL/hr at 01/21/20 0539, Continued from Pre-op at 01/21/20 7673  Medications Prior to Admission  Medication Sig Dispense Refill Last Dose  . aspirin EC 325 MG tablet Take 1 tablet (325 mg total) by mouth daily. 90 tablet 3 Past Week at Unknown time  . atorvastatin (LIPITOR) 80 MG tablet Take 1 tablet (80 mg total) by mouth daily. 90 tablet 3 Past Week at Unknown time  . benzonatate (TESSALON) 200 MG capsule Take 1 capsule (200 mg total) by mouth 3 (three) times daily as needed. 30 capsule 0 Past Week at Unknown time  . buPROPion (WELLBUTRIN) 75 MG tablet TAKE 2 TABLETS BY MOUTH DAILY FOR 3 DAYS, THEN 2 TABLETS TWO TIMES DAILY. 360 tablet 1 Past Week at Unknown time  . cholecalciferol (VITAMIN D) 1000 units tablet Take 1,000 Units by mouth daily.   Past Week at Unknown time  . fluticasone (FLONASE) 50 MCG/ACT nasal spray Place 2 sprays daily into both nostrils. 1 g 2 Past Week at Unknown time  . hydrochlorothiazide (HYDRODIURIL) 12.5 MG tablet Take 1 tablet (12.5 mg total) by mouth daily. 90 tablet 0 01/20/2020 at Unknown time  . meloxicam (MOBIC) 15 MG tablet TAKE 1 TABLET BY MOUTH EVERY DAY 30 tablet 0 Past Week at Unknown time  . metoprolol tartrate (LOPRESSOR) 25 MG tablet Take 25 mg by mouth 2 (two) times daily.   Past Week at Unknown time  . predniSONE (STERAPRED UNI-PAK 21 TAB) 10 MG (21) TBPK tablet 6 day taper; take as directed on package instructions 21 tablet 0 Past Week at  Unknown time  . azithromycin (ZITHROMAX) 250 MG tablet Take 2 tablets PO on day one, and one tablet PO daily thereafter until completed. 6 tablet 0      Allergies  Allergen Reactions  . Penicillins Other (See Comments)    chills     Past Medical History:  Diagnosis Date  . Anemia   . Depression   . Dyspnea    due to stroke  . Hypertension   . 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 Sleep apnea   . Stroke [facility])    November 2018     Review of systems:  Otherwise negative.    Physical Exam  Gen: Alert, oriented. Appears stated age.  HEENT: [place]/AT. PERRLA. Lungs: CTA, no wheezes. CV: RR nl S1, S2. Abd: soft, benign, no masses. BS+ Ext: No edema. Pulses 2+    Planned procedures: Proceed with colonoscopy. The patient understands the nature of the planned procedure, indications, risks, alternatives and potential complications including but not limited to bleeding, infection, perforation, damage to internal organs and possible oversedation/side effects from anesthesia. The patient agrees and gives consent to proceed.  Please refer to procedure notes for findings, recommendations and patient disposition/instructions.     Dalten Ambrosino K. Alice Reichert, M.D. Gastroenterology 01/21/2020  9:30 AM

## 2020-01-21 NOTE — Op Note (Signed)
[facility] Gastroenterology [name] Procedure Date: 01/21/2020 9:24 AM MRN: [id number] Account #: [id number] Date of Birth: January 26, 1951 Admit Type: Outpatient Age: 69 Room: [facility] ENDO ROOM 2 Gender: Female Note Status: Finalized Procedure:             Colonoscopy Indications:           Screening for colorectal malignant neoplasm Providers:             Benay Pike. Naquisha Whitehair MD, MD Medicines:             Propofol per Anesthesia Complications:         No immediate complications. Procedure:             Pre-Anesthesia Assessment:                        - The risks and benefits of the procedure and the                         sedation options and risks were discussed with the                         patient. All questions were answered and informed                         consent was obtained.                        - Patient identification and proposed procedure were                         verified prior to the procedure by the nurse. The                         procedure was verified in the procedure room.                        - ASA Grade Assessment: III - A patient with severe                         systemic disease.                        - After reviewing the risks and benefits, the patient                         was deemed in satisfactory condition to undergo the                         procedure.                        After obtaining informed consent, the colonoscope was                         passed under direct vision. Throughout the procedure,                         the patient's blood pressure, pulse, and oxygen  saturations were monitored continuously. The                         Colonoscope was introduced through the anus and                         advanced to the the cecum, identified by appendiceal                         orifice and ileocecal valve. The colonoscopy was                         performed  without difficulty. The patient tolerated                         the procedure well. The quality of the bowel                         preparation was good. The ileocecal valve, appendiceal                         orifice, and rectum were photographed. Findings:      The perianal exam findings include internal hemorrhoids that prolapse       with straining, but require manual replacement into the anal canal       (Grade III).      The digital rectal exam was normal. Pertinent negatives include normal       sphincter tone.      Many small and large-mouthed diverticula were found in the sigmoid       colon. There was no evidence of diverticular bleeding.      Two sessile polyps were found in the ascending colon. The polyps were 3       to 4 mm in size. These polyps were removed with a cold biopsy forceps.       Resection and retrieval were complete.      A 9 mm polyp was found in the ascending colon. The polyp was sessile.       The polyp was removed with a cold snare. Resection and retrieval were       complete.      Two sessile polyps were found in the transverse colon. The polyps were 4       to 5 mm in size. These polyps were removed with a jumbo cold forceps.       Resection and retrieval were complete.      Non-bleeding internal hemorrhoids were found during retroflexion. The       hemorrhoids were Grade II (internal hemorrhoids that prolapse but reduce       spontaneously).      The exam was otherwise without abnormality. Impression:            - Internal hemorrhoids that prolapse with straining,                         but require manual replacement into the anal canal                         (Grade III) found on perianal exam.                        -  Mild diverticulosis in the sigmoid colon. There was                         no evidence of diverticular bleeding.                        - Two 3 to 4 mm polyps in the ascending colon, removed                         with a cold  biopsy forceps. Resected and retrieved.                        - One 9 mm polyp in the ascending colon, removed with                         a cold snare. Resected and retrieved.                        - Two 4 to 5 mm polyps in the transverse colon,                         removed with a jumbo cold forceps. Resected and                         retrieved.                        - Non-bleeding internal hemorrhoids.                        - The examination was otherwise normal. Recommendation:        - Patient has a contact number available for                         emergencies. The signs and symptoms of potential                         delayed complications were discussed with the patient.                         Return to normal activities tomorrow. Written                         discharge instructions were provided to the patient.                        - Resume previous diet.                        - Continue present medications.                        - Repeat colonoscopy is recommended for surveillance.                         The colonoscopy date will be determined after                         pathology results from today's exam become  available                         for review.                        - Return to GI office PRN.                        - The findings and recommendations were discussed with                         the patient. Procedure Code(s):     --- Professional ---                        [phone], Colonoscopy, flexible; with removal of                         tumor(s), polyp(s), or other lesion(s) by snare                         technique                        45380, 70, Colonoscopy, flexible; with biopsy, single                         or multiple Diagnosis Code(s):     --- Professional ---                        K57.30, Diverticulosis of large intestine without                         perforation or abscess without bleeding                        K64.2,  Third degree hemorrhoids                        Z12.11, Encounter for screening for malignant neoplasm                         of colon                        K63.5, Polyp of colon CPT copyright 2019 American Medical Association. All rights reserved. The codes documented in this report are preliminary and upon coder review may  be revised to meet current compliance requirements. Efrain Sella MD, MD 01/21/2020 9:50:31 AM This report has been signed electronically. Number of Addenda: 0 Note Initiated On: 01/21/2020 9:24 AM Scope Withdrawal Time: 0 hours 7 minutes 32 seconds  Total Procedure Duration: 0 hours 10 minutes 11 seconds  Estimated Blood Loss:  Estimated blood loss: none.      [facility]

## 2020-01-21 NOTE — Anesthesia Postprocedure Evaluation (Signed)
Anesthesia Post Note  Patient: Angel Lane  Procedure(s) Performed: COLONOSCOPY (N/A )  Patient location during evaluation: Endoscopy Anesthesia Type: General Level of consciousness: awake and alert and oriented Pain management: pain level controlled Vital Signs Assessment: post-procedure vital signs reviewed and stable Respiratory status: spontaneous breathing, nonlabored ventilation and respiratory function stable Cardiovascular status: blood pressure returned to baseline and stable Postop Assessment: no signs of nausea or vomiting Anesthetic complications: no   No complications documented.   Last Vitals:  Vitals:   01/21/20 0948 01/21/20 1008  BP: 103/61 (!) 176/96  Pulse: 80   Resp: (!) 27   Temp: 36.6 C   SpO2: 95%     Last Pain:  Vitals:   01/21/20 1008  TempSrc:   PainSc: 0-No pain                 Tiari Andringa

## 2020-01-22 LAB — SURGICAL PATHOLOGY

## 2020-02-04 ENCOUNTER — Encounter: Payer: Self-pay | Admitting: Family Medicine

## 2020-02-06 ENCOUNTER — Encounter: Payer: Federal, State, Local not specified - PPO | Admitting: Family Medicine

## 2020-02-19 ENCOUNTER — Other Ambulatory Visit: Payer: Self-pay

## 2020-02-19 ENCOUNTER — Ambulatory Visit (INDEPENDENT_AMBULATORY_CARE_PROVIDER_SITE_OTHER): Payer: Federal, State, Local not specified - PPO | Admitting: Family Medicine

## 2020-02-19 ENCOUNTER — Encounter: Payer: Self-pay | Admitting: Family Medicine

## 2020-02-19 VITALS — BP 142/85 | HR 79 | Temp 98.6°F | Resp 16 | Ht 65.0 in | Wt 193.0 lb

## 2020-02-19 DIAGNOSIS — Z720 Tobacco use: Secondary | ICD-10-CM

## 2020-02-19 DIAGNOSIS — R0600 Dyspnea, unspecified: Secondary | ICD-10-CM

## 2020-02-19 DIAGNOSIS — Z1231 Encounter for screening mammogram for malignant neoplasm of breast: Secondary | ICD-10-CM | POA: Insufficient documentation

## 2020-02-19 DIAGNOSIS — E7800 Pure hypercholesterolemia, unspecified: Secondary | ICD-10-CM | POA: Diagnosis not present

## 2020-02-19 DIAGNOSIS — Z8673 Personal history of transient ischemic attack (TIA), and cerebral infarction without residual deficits: Secondary | ICD-10-CM | POA: Insufficient documentation

## 2020-02-19 DIAGNOSIS — R739 Hyperglycemia, unspecified: Secondary | ICD-10-CM | POA: Diagnosis not present

## 2020-02-19 DIAGNOSIS — Z9989 Dependence on other enabling machines and devices: Secondary | ICD-10-CM

## 2020-02-19 DIAGNOSIS — Z0000 Encounter for general adult medical examination without abnormal findings: Secondary | ICD-10-CM | POA: Diagnosis not present

## 2020-02-19 DIAGNOSIS — M25552 Pain in left hip: Secondary | ICD-10-CM | POA: Insufficient documentation

## 2020-02-19 DIAGNOSIS — I10 Essential (primary) hypertension: Secondary | ICD-10-CM | POA: Diagnosis not present

## 2020-02-19 DIAGNOSIS — G4733 Obstructive sleep apnea (adult) (pediatric): Secondary | ICD-10-CM | POA: Insufficient documentation

## 2020-02-19 DIAGNOSIS — R002 Palpitations: Secondary | ICD-10-CM | POA: Diagnosis not present

## 2020-02-19 DIAGNOSIS — R0609 Other forms of dyspnea: Secondary | ICD-10-CM | POA: Insufficient documentation

## 2020-02-19 DIAGNOSIS — E559 Vitamin D deficiency, unspecified: Secondary | ICD-10-CM

## 2020-02-19 DIAGNOSIS — G8194 Hemiplegia, unspecified affecting left nondominant side: Secondary | ICD-10-CM

## 2020-02-19 MED ORDER — ROSUVASTATIN CALCIUM 5 MG PO TABS: 5.0000 mg | ORAL_TABLET | Freq: Every day | ORAL | 5 refills | Status: DC

## 2020-02-19 NOTE — Assessment & Plan Note (Signed)
Restarting statin Recheck Lipid levels

## 2020-02-19 NOTE — Assessment & Plan Note (Signed)
Palpations when exerting self may be correlated to SOB on exertion No history of Afib Cardiac exam reassuring EKG today Referral to cardiology

## 2020-02-19 NOTE — Progress Notes (Signed)
Complete physical exam   Patient: Angel Lane   DOB: 1950/12/14   69 y.o. Female  MRN: [id number] Visit Date: 02/19/2020  Today's healthcare provider: Lavon Paganini, MD   Chief Complaint  Patient presents with  . Annual Exam   Subjective    Angel Lane is a 69 y.o. female who presents today for a complete physical exam.  She reports consuming a general diet. The patient does not participate in regular exercise at present. She generally feels fairly well. She reports sleeping well. She does have additional problems to discuss today.  HPI   Ms. Cumby reports that her head hurts after taking aspirin or HCTZ, always on the right side. She notes that the headaches are only present in the late afternoon or evening so she is unsure if they directly correlate to her medications because she takes them inconsistently throughout the day. The headache is unilateral and constant and not associated with changes in vision hearing or photophobia.  Additionally, she has been experiencing SOB on exertion, particularly when walking to her mail box. She also endorses some feelings of 'palpations' and chest pressure when exerting herself. She denies chest pain, leg swelling or orthopnea. She has had no fevers, chills or N/V/D   Past Medical History:  Diagnosis Date  . Anemia   . Depression   . Dyspnea    due to stroke  . Hypertension   . Neuromuscular disorder ([place])    left sided weakness from stroke  . Sleep apnea   . Stroke [facility])    November 2018    Past Surgical History:  Procedure Laterality Date  . ABDOMINAL SURGERY    . CARDIAC CATHETERIZATION    . CATARACT EXTRACTION W/PHACO Left 05/16/2017   Procedure: CATARACT EXTRACTION PHACO AND INTRAOCULAR LENS PLACEMENT ([place]) LEFT;  Surgeon: Leandrew Koyanagi, MD;  Location: [place];  Service: Ophthalmology;  Laterality: Left;  . CATARACT EXTRACTION W/PHACO Right 06/13/2017   Procedure: CATARACT EXTRACTION PHACO  AND INTRAOCULAR LENS PLACEMENT ([place]) RIGHT;  Surgeon: Leandrew Koyanagi, MD;  Location: [place];  Service: Ophthalmology;  Laterality: Right;  sleep apnea  . CHOLECYSTECTOMY    . COLONOSCOPY N/A 01/21/2020   Procedure: COLONOSCOPY;  Surgeon: Toledo, Benay Pike, MD;  Location: [facility] ENDOSCOPY;  Service: Gastroenterology;  Laterality: N/A;   Social History   Socioeconomic History  . Marital status: Married    Spouse name: Not on file  . Number of children: Not on file  . Years of education: Not on file  . Highest education level: Not on file  Occupational History  . Not on file  Tobacco Use  . Smoking status: Current Every Day Smoker    Packs/day: 1.00    Years: 53.00    Pack years: 53.00    Types: Cigarettes  . Smokeless tobacco: Never Used  . Tobacco comment: Less than 1/2 PPD  Vaping Use  . Vaping Use: Former  Substance and Sexual Activity  . Alcohol use: No  . Drug use: No  . Sexual activity: Yes  Other Topics Concern  . Not on file  Social History Narrative  . Not on file   Social Determinants of Health   Financial Resource Strain: Not on file  Food Insecurity: Not on file  Transportation Needs: Not on file  Physical Activity: Not on file  Stress: Not on file  Social Connections: Not on file  Intimate Partner Violence: Not on file   Family Status  Relation  Name Status  . Mother  Deceased  . Father  Deceased  . Mat Aunt  (Not Specified)  . Cousin  (Not Specified)   Family History  Problem Relation Age of Onset  . Asthma Mother   . Heart failure Mother   . Hypertension Mother   . Heart attack Father   . Breast cancer Maternal Aunt   . Breast cancer Cousin        maternal 1st cousin   Allergies  Allergen Reactions  . Penicillins Other (See Comments)    chills    Patient Care Team: Virginia Crews, MD as PCP - General (Family Medicine)   Medications: Outpatient Medications Prior to Visit  Medication Sig  . aspirin EC 325 MG  tablet Take 1 tablet (325 mg total) by mouth daily.  . cholecalciferol (VITAMIN D) 1000 units tablet Take 1,000 Units by mouth daily.  . fluticasone (FLONASE) 50 MCG/ACT nasal spray Place 2 sprays daily into both nostrils.  . hydrochlorothiazide (HYDRODIURIL) 12.5 MG tablet Take 1 tablet (12.5 mg total) by mouth daily.  . [DISCONTINUED] atorvastatin (LIPITOR) 80 MG tablet Take 1 tablet (80 mg total) by mouth daily.  . [DISCONTINUED] azithromycin (ZITHROMAX) 250 MG tablet Take 2 tablets PO on day one, and one tablet PO daily thereafter until completed.  . [DISCONTINUED] benzonatate (TESSALON) 200 MG capsule Take 1 capsule (200 mg total) by mouth 3 (three) times daily as needed.  . [DISCONTINUED] buPROPion (WELLBUTRIN) 75 MG tablet TAKE 2 TABLETS BY MOUTH DAILY FOR 3 DAYS, THEN 2 TABLETS TWO TIMES DAILY.  . [DISCONTINUED] meloxicam (MOBIC) 15 MG tablet TAKE 1 TABLET BY MOUTH EVERY DAY  . [DISCONTINUED] metoprolol tartrate (LOPRESSOR) 25 MG tablet Take 25 mg by mouth 2 (two) times daily.  . [DISCONTINUED] predniSONE (STERAPRED UNI-PAK 21 TAB) 10 MG (21) TBPK tablet 6 day taper; take as directed on package instructions   No facility-administered medications prior to visit.    Review of Systems  Constitutional: Negative.   HENT: Negative.   Eyes: Negative.   Respiratory: Positive for shortness of breath.   Cardiovascular: Positive for palpitations.  Gastrointestinal: Negative.   Endocrine: Negative.   Genitourinary: Negative.   Musculoskeletal: Positive for arthralgias.  Skin: Negative.   Allergic/Immunologic: Negative.   Neurological: Negative.   Hematological: Negative.   Psychiatric/Behavioral: Negative.       Objective    BP (!) 142/85 (BP Location: Left Arm, Patient Position: Sitting, Cuff Size: Normal)   Pulse 79   Temp 98.6 F (37 C) (Oral)   Resp 16   Ht 5\' 5"  (1.651 m)   Wt 193 lb (87.5 kg)   BMI 32.12 kg/m    Physical Exam   General: Well appearing Cards: RRR, no  murmurs rubs or gallops. No LE edema Pulm: CTA bilaterally MSK: L Hip elicits pain with internal rotation Extremities  5/5 Strength in both lower extremities  5/5 strength in R and 4+ strength in L arm  Last depression screening scores PHQ 2/9 Scores 02/19/2020 07/02/2019 10/08/2017  PHQ - 2 Score 0 1 3  PHQ- 9 Score 6 7 9    Last fall risk screening Fall Risk  02/19/2020  Falls in the past year? 0  Number falls in past yr: 0  Injury with Fall? 0  Risk for fall due to : No Fall Risks  Follow up Falls evaluation completed   Last Audit-C alcohol use screening Alcohol Use Disorder Test (AUDIT) 02/19/2020  1. How often do you have a  drink containing alcohol? 0  2. How many drinks containing alcohol do you have on a typical day when you are drinking? 0  3. How often do you have six or more drinks on one occasion? 0  AUDIT-C Score 0  Alcohol Brief Interventions/Follow-up AUDIT Score <7 follow-up not indicated   A score of 3 or more in women, and 4 or more in men indicates increased risk for alcohol abuse, EXCEPT if all of the points are from question 1   No results found for any visits on 02/19/20.  Assessment & Plan    Routine Health Maintenance and Physical Exam  Exercise Activities and Dietary recommendations Goals   None     Immunization History  Administered Date(s) Administered  . PFIZER SARS-COV-2 Vaccination 09/24/2019, 10/15/2019  . Pneumococcal Conjugate-13 03/23/2017  . Pneumococcal Polysaccharide-23 05/29/2019  . Tdap 10/08/2017    Health Maintenance  Topic Date Due  . INFLUENZA VACCINE  06/03/2020 (Originally 10/05/2019)  . MAMMOGRAM  02/22/2020  . COVID-19 Vaccine (3 - Booster for Pfizer series) 04/16/2020  . TETANUS/TDAP  10/09/2027  . COLONOSCOPY  01/20/2030  . DEXA SCAN  Completed  . Hepatitis C Screening  Completed  . PNA vac Low Risk Adult  Completed    Discussed health benefits of physical activity, and encouraged her to engage in regular exercise  appropriate for her age and condition.  Problem List Items Addressed This Visit      Cardiovascular and Mediastinum   HTN (hypertension)    142/85 today, but did not take medications Previously well controlled Continue current medications, encouraged compliance      Relevant Medications   rosuvastatin (CRESTOR) 5 MG tablet   Other Relevant Orders   Comprehensive metabolic panel   EKG [id number] (Completed)     Respiratory   OSA on CPAP    Well controlled with CPAP Continue to use        Nervous and Auditory   Left hemiparesis (HCC)    Chronic and stable Most prominently observed in the arms        Other   Tobacco abuse disorder    Has tried patches and wellbutrin with no success. Pt. Is motivated to quite smoking Not interested in medical smoking aids at this time Currently smoking one pack a day.      Pure hypercholesterolemia    Self d/c statin due to concern for side effects Switch to crestor 5 mg Encouraged compliance      Relevant Medications   rosuvastatin (CRESTOR) 5 MG tablet   Other Relevant Orders   Comprehensive metabolic panel   Lipid panel   Hyperglycemia    Prediabetic range Discussed healthy diet and exercise Recheck A1c      Relevant Orders   Hemoglobin A1c   Avitaminosis D   Relevant Orders   VITAMIN D 25 Hydroxy (Vit-D Deficiency, Fractures)   History of CVA (cerebrovascular accident)    Restarting statin Recheck Lipid levels      Relevant Orders   Lipid panel   DOE (dyspnea on exertion)    New problem, presents with feelings of palpitations and chest pressure Fam hx of heart problems Smoking 1 pack a day Cardiac Exam reassuring No evidence of swelling EKG Referral to Cardiology       Relevant Orders   Ambulatory referral to Cardiology   Encounter for annual physical exam - Primary   Relevant Orders   Comprehensive metabolic panel   Lipid panel   VITAMIN D 25 Hydroxy (  Vit-D Deficiency, Fractures)   Hemoglobin A1c    Breast cancer screening by mammogram   Relevant Orders   MM 3D SCREEN BREAST BILATERAL   Palpitations    Palpations when exerting self may be correlated to SOB on exertion No history of Afib Cardiac exam reassuring EKG today Referral to cardiology      Relevant Orders   Ambulatory referral to Cardiology   TSH   CBC   EKG 12-Lead (Completed)   Left hip pain    Pain and numbess in left hip, worse with internal rotation C/f osteoarthritis Continue NSAIDs, continue to monitor Consider referral to orthopedics if worsens      Relevant Orders   DG Hip Unilat W OR W/O Pelvis 2-3 Views Left       Return in about 6 months (around 08/19/2020) for chronic disease f/u.     Patient seen along with MS3 student [facility]. I personally evaluated this patient along with the student, and verified all aspects of the history, physical exam, and medical decision making as documented by the student. I agree with the student's documentation and have made all necessary edits.  Hillary Struss, [name], MD, MPH [facility]

## 2020-02-19 NOTE — Assessment & Plan Note (Signed)
Well controlled with CPAP Continue to use

## 2020-02-19 NOTE — Assessment & Plan Note (Signed)
New problem, presents with feelings of palpitations and chest pressure Fam hx of heart problems Smoking 1 pack a day Cardiac Exam reassuring No evidence of swelling EKG Referral to Cardiology

## 2020-02-19 NOTE — Assessment & Plan Note (Signed)
142/85 today, but did not take medications Previously well controlled Continue current medications, encouraged compliance

## 2020-02-19 NOTE — Assessment & Plan Note (Signed)
Pain and numbess in left hip, worse with internal rotation C/f osteoarthritis Continue NSAIDs, continue to monitor Consider referral to orthopedics if worsens

## 2020-02-19 NOTE — Patient Instructions (Addendum)
The CDC recommends two doses of Shingrix (the shingles vaccine) separated by 2 to 6 months for adults age 70 years and older. I recommend checking with your insurance plan regarding coverage for this vaccine.     Preventive Care 70 Years and Older and Older, Female Preventive care refers to lifestyle choices and visits with your health care provider that can promote health and wellness. This includes:  A yearly physical exam. This is also called an annual well check.  Regular dental and eye exams.  Immunizations.  Screening for certain conditions.  Healthy lifestyle choices, such as diet and exercise. What can I expect for my preventive care visit? Physical exam Your health care provider will check:  Height and weight. These may be used to calculate body mass index (BMI), which is a measurement that tells if you are at a healthy weight.  Heart rate and blood pressure.  Your skin for abnormal spots. Counseling Your health care provider may ask you questions about:  Alcohol, tobacco, and drug use.  Emotional well-being.  Home and relationship well-being.  Sexual activity.  Eating habits.  History of falls.  Memory and ability to understand (cognition).  Work and work Statistician.  Pregnancy and menstrual history. What immunizations do I need?  Influenza (flu) vaccine  This is recommended every year. Tetanus, diphtheria, and pertussis (Tdap) vaccine  You may need a Td booster every 10 years. Varicella (chickenpox) vaccine  You may need this vaccine if you have not already been vaccinated. Zoster (shingles) vaccine  You may need this after age 70. Pneumococcal conjugate (PCV13) vaccine  One dose is recommended after age 70. Pneumococcal polysaccharide (PPSV23) vaccine  One dose is recommended after age 70. Measles, mumps, and rubella (MMR) vaccine  You may need at least one dose of MMR if you were born in 1957 or later. You may also need a second  dose. Meningococcal conjugate (MenACWY) vaccine  You may need this if you have certain conditions. Hepatitis A vaccine  You may need this if you have certain conditions or if you travel or work in places where you may be exposed to hepatitis A. Hepatitis B vaccine  You may need this if you have certain conditions or if you travel or work in places where you may be exposed to hepatitis B. Haemophilus influenzae type b (Hib) vaccine  You may need this if you have certain conditions. You may receive vaccines as individual doses or as more than one vaccine together in one shot (combination vaccines). Talk with your health care provider about the risks and benefits of combination vaccines. What tests do I need? Blood tests  Lipid and cholesterol levels. These may be checked every 5 years, or more frequently depending on your overall health.  Hepatitis C test.  Hepatitis B test. Screening  Lung cancer screening. You may have this screening every year starting at age 70 if you have a 30-pack-year history of smoking and currently smoke or have quit within the past 15 years.  Colorectal cancer screening. All adults should have this screening starting at age 70 and continuing until age 70. Your health care provider may recommend screening at age 70 if you are at increased risk. You will have tests every 1-10 years, depending on your results and the type of screening test.  Diabetes screening. This is done by checking your blood sugar (glucose) after you have not eaten for a while (fasting). You may have this done every 1-3 years.  Mammogram. This may  be done every 1-2 years. Talk with your health care provider about how often you should have regular mammograms.  BRCA-related cancer screening. This may be done if you have a family history of breast, ovarian, tubal, or peritoneal cancers. Other tests  Sexually transmitted disease (STD) testing.  Bone density scan. This is done to screen for  osteoporosis. You may have this done starting at age 70. Follow these instructions at home: Eating and drinking  Eat a diet that includes fresh fruits and vegetables, whole grains, lean protein, and low-fat dairy products. Limit your intake of foods with high amounts of sugar, saturated fats, and salt.  Take vitamin and mineral supplements as recommended by your health care provider.  Do not drink alcohol if your health care provider tells you not to drink.  If you drink alcohol: ? Limit how much you have to 0-1 drink a day. ? Be aware of how much alcohol is in your drink. In the [place] one drink equals one 12 oz bottle of beer (355 mL), one 5 oz glass of wine (148 mL), or one 1 oz glass of hard liquor (44 mL). Lifestyle  Take daily care of your teeth and gums.  Stay active. Exercise for at least 30 minutes on 5 or more days each week.  Do not use any products that contain nicotine or tobacco, such as cigarettes, e-cigarettes, and chewing tobacco. If you need help quitting, ask your health care provider.  If you are sexually active, practice safe sex. Use a condom or other form of protection in order to prevent STIs (sexually transmitted infections).  Talk with your health care provider about taking a low-dose aspirin or statin. What's next?  Go to your health care provider once a year for a well check visit.  Ask your health care provider how often you should have your eyes and teeth checked.  Stay up to date on all vaccines. This information is not intended to replace advice given to you by your health care provider. Make sure you discuss any questions you have with your health care provider. Document Revised: 02/14/2018 Document Reviewed: 02/14/2018 Elsevier Patient Education  2020 Elsevier Inc[employer]  

## 2020-02-19 NOTE — Assessment & Plan Note (Signed)
Self d/c statin due to concern for side effects Switch to crestor 5 mg Encouraged compliance

## 2020-02-19 NOTE — Assessment & Plan Note (Signed)
Has tried patches and wellbutrin with no success. Pt. Is motivated to quite smoking Not interested in medical smoking aids at this time Currently smoking one pack a day.

## 2020-02-19 NOTE — Assessment & Plan Note (Signed)
Chronic and stable Most prominently observed in the arms

## 2020-02-19 NOTE — Assessment & Plan Note (Signed)
Prediabetic range Discussed healthy diet and exercise Recheck A1c

## 2020-02-20 ENCOUNTER — Ambulatory Visit
Admission: RE | Admit: 2020-02-20 | Discharge: 2020-02-20 | Disposition: A | Discharge status: Home / Self Care | Payer: Federal, State, Local not specified - PPO | Source: Ambulatory Visit | Attending: Family Medicine | Admitting: Family Medicine

## 2020-02-20 ENCOUNTER — Ambulatory Visit
Admission: RE | Admit: 2020-02-20 | Discharge: 2020-02-20 | Disposition: A | Discharge status: Home / Self Care | Payer: Federal, State, Local not specified - PPO | Attending: Family Medicine | Admitting: Family Medicine

## 2020-02-20 ENCOUNTER — Telehealth: Payer: Self-pay

## 2020-02-20 DIAGNOSIS — M25552 Pain in left hip: Secondary | ICD-10-CM | POA: Insufficient documentation

## 2020-02-20 LAB — LIPID PANEL
Chol/HDL Ratio: 3.3 ratio (ref 0.0–4.4)
Cholesterol, Total: 170 mg/dL (ref 100–199)
HDL: 51 mg/dL (ref >39)
LDL Chol Calc (NIH): 105 mg/dL — ABNORMAL HIGH (ref 0–99)
Triglycerides: 74 mg/dL (ref 0–149)
VLDL Cholesterol Cal: 14 mg/dL (ref 5–40)

## 2020-02-20 LAB — COMPREHENSIVE METABOLIC PANEL
ALT: 13 IU/L (ref 0–32)
AST: 15 IU/L (ref 0–40)
Albumin/Globulin Ratio: 1.5 (ref 1.2–2.2)
Albumin: 4.1 g/dL (ref 3.8–4.8)
Alkaline Phosphatase: 94 IU/L (ref 44–121)
BUN/Creatinine Ratio: 20 (ref 12–28)
BUN: 17 mg/dL (ref 8–27)
Bilirubin Total: 0.5 mg/dL (ref 0.0–1.2)
CO2: 26 mmol/L (ref 20–29)
Calcium: 9.7 mg/dL (ref 8.7–10.3)
Chloride: 104 mmol/L (ref 96–106)
Creatinine, Ser: 0.87 mg/dL (ref 0.57–1.00)
GFR calc Af Amer: 78 mL/min/{1.73_m2} (ref >59)
GFR calc non Af Amer: 68 mL/min/{1.73_m2} (ref >59)
Globulin, Total: 2.7 g/dL (ref 1.5–4.5)
Glucose: 92 mg/dL (ref 65–99)
Potassium: 4.1 mmol/L (ref 3.5–5.2)
Sodium: 141 mmol/L (ref 134–144)
Total Protein: 6.8 g/dL (ref 6.0–8.5)

## 2020-02-20 LAB — CBC
Hematocrit: 40.8 % (ref 34.0–46.6)
Hemoglobin: 13.4 g/dL (ref 11.1–15.9)
MCH: 29.9 pg (ref 26.6–33.0)
MCHC: 32.8 g/dL (ref 31.5–35.7)
MCV: 91 fL (ref 79–97)
Platelets: 200 10*3/uL (ref 150–450)
RBC: 4.48 x10E6/uL (ref 3.77–5.28)
RDW: 12.3 % (ref 11.7–15.4)
WBC: 6.6 10*3/uL (ref 3.4–10.8)

## 2020-02-20 LAB — HEMOGLOBIN A1C
Est. average glucose Bld gHb Est-mCnc: 114 mg/dL
Hgb A1c MFr Bld: 5.6 % (ref 4.8–5.6)

## 2020-02-20 LAB — VITAMIN D 25 HYDROXY (VIT D DEFICIENCY, FRACTURES): Vit D, 25-Hydroxy: 30.3 ng/mL (ref 30.0–100.0)

## 2020-02-20 LAB — TSH: TSH: 1.27 u[IU]/mL (ref 0.450–4.500)

## 2020-02-20 NOTE — Telephone Encounter (Signed)
Pt advised.   Thanks,   -Fiorela Pelzer  

## 2020-02-20 NOTE — Telephone Encounter (Signed)
-----   Message from Virginia Crews, MD sent at 02/20/2020  8:13 AM EST ----- Normal labs, except high LDL - bad cholesterol.  Start Crestor like we discussed.

## 2020-03-25 ENCOUNTER — Encounter: Payer: Self-pay | Admitting: Family Medicine

## 2020-04-17 ENCOUNTER — Encounter: Payer: Self-pay | Admitting: Family Medicine

## 2020-05-01 ENCOUNTER — Inpatient Hospital Stay (HOSPITAL_COMMUNITY)
Admission: EM | Admit: 2020-05-01 | Discharge: 2020-05-08 | DRG: 299 | Disposition: A | Discharge status: Home / Self Care | Payer: Medicare Other | Attending: Family Medicine | Admitting: Family Medicine

## 2020-05-01 ENCOUNTER — Other Ambulatory Visit: Payer: Self-pay

## 2020-05-01 ENCOUNTER — Emergency Department (HOSPITAL_COMMUNITY): Payer: Medicare Other

## 2020-05-01 DIAGNOSIS — I69354 Hemiplegia and hemiparesis following cerebral infarction affecting left non-dominant side: Secondary | ICD-10-CM | POA: Diagnosis not present

## 2020-05-01 DIAGNOSIS — I71012 Dissection of descending thoracic aorta: Secondary | ICD-10-CM

## 2020-05-01 DIAGNOSIS — I351 Nonrheumatic aortic (valve) insufficiency: Secondary | ICD-10-CM | POA: Diagnosis not present

## 2020-05-01 DIAGNOSIS — I10 Essential (primary) hypertension: Secondary | ICD-10-CM | POA: Diagnosis present

## 2020-05-01 DIAGNOSIS — Z9841 Cataract extraction status, right eye: Secondary | ICD-10-CM

## 2020-05-01 DIAGNOSIS — Q278 Other specified congenital malformations of peripheral vascular system: Secondary | ICD-10-CM

## 2020-05-01 DIAGNOSIS — I7103 Dissection of thoracoabdominal aorta: Principal | ICD-10-CM | POA: Diagnosis present

## 2020-05-01 DIAGNOSIS — Z880 Allergy status to penicillin: Secondary | ICD-10-CM

## 2020-05-01 DIAGNOSIS — F32A Depression, unspecified: Secondary | ICD-10-CM | POA: Diagnosis present

## 2020-05-01 DIAGNOSIS — I7123 Aneurysm of the descending thoracic aorta, without rupture: Secondary | ICD-10-CM

## 2020-05-01 DIAGNOSIS — I7101 Dissection of thoracic aorta: Secondary | ICD-10-CM

## 2020-05-01 DIAGNOSIS — F17210 Nicotine dependence, cigarettes, uncomplicated: Secondary | ICD-10-CM | POA: Diagnosis present

## 2020-05-01 DIAGNOSIS — J189 Pneumonia, unspecified organism: Secondary | ICD-10-CM | POA: Diagnosis not present

## 2020-05-01 DIAGNOSIS — I71019 Dissection of thoracic aorta, unspecified: Secondary | ICD-10-CM

## 2020-05-01 DIAGNOSIS — J9601 Acute respiratory failure with hypoxia: Secondary | ICD-10-CM | POA: Diagnosis present

## 2020-05-01 DIAGNOSIS — Z79899 Other long term (current) drug therapy: Secondary | ICD-10-CM

## 2020-05-01 DIAGNOSIS — Y95 Nosocomial condition: Secondary | ICD-10-CM | POA: Diagnosis not present

## 2020-05-01 DIAGNOSIS — I712 Thoracic aortic aneurysm, without rupture: Secondary | ICD-10-CM | POA: Diagnosis not present

## 2020-05-01 DIAGNOSIS — Z9842 Cataract extraction status, left eye: Secondary | ICD-10-CM | POA: Diagnosis not present

## 2020-05-01 DIAGNOSIS — E876 Hypokalemia: Secondary | ICD-10-CM | POA: Diagnosis not present

## 2020-05-01 DIAGNOSIS — Z961 Presence of intraocular lens: Secondary | ICD-10-CM | POA: Diagnosis present

## 2020-05-01 DIAGNOSIS — G4730 Sleep apnea, unspecified: Secondary | ICD-10-CM | POA: Diagnosis present

## 2020-05-01 DIAGNOSIS — Z8249 Family history of ischemic heart disease and other diseases of the circulatory system: Secondary | ICD-10-CM

## 2020-05-01 DIAGNOSIS — I7100 Dissection of unspecified site of aorta: Secondary | ICD-10-CM | POA: Diagnosis present

## 2020-05-01 DIAGNOSIS — Z7982 Long term (current) use of aspirin: Secondary | ICD-10-CM | POA: Diagnosis not present

## 2020-05-01 DIAGNOSIS — Z20822 Contact with and (suspected) exposure to covid-19: Secondary | ICD-10-CM | POA: Diagnosis present

## 2020-05-01 DIAGNOSIS — I951 Orthostatic hypotension: Secondary | ICD-10-CM | POA: Diagnosis not present

## 2020-05-01 DIAGNOSIS — J439 Emphysema, unspecified: Secondary | ICD-10-CM | POA: Diagnosis present

## 2020-05-01 DIAGNOSIS — J9811 Atelectasis: Secondary | ICD-10-CM | POA: Diagnosis present

## 2020-05-01 DIAGNOSIS — Z825 Family history of asthma and other chronic lower respiratory diseases: Secondary | ICD-10-CM

## 2020-05-01 DIAGNOSIS — I7102 Dissection of abdominal aorta: Secondary | ICD-10-CM | POA: Diagnosis not present

## 2020-05-01 LAB — RESP PANEL BY RT-PCR (FLU A&B, COVID) ARPGX2
Influenza A by PCR: NEGATIVE
Influenza B by PCR: NEGATIVE
SARS Coronavirus 2 by RT PCR: NEGATIVE

## 2020-05-01 LAB — TROPONIN I (HIGH SENSITIVITY)
Troponin I (High Sensitivity): 5 ng/L (ref <18)
Troponin I (High Sensitivity): 9 ng/L (ref <18)

## 2020-05-01 LAB — COMPREHENSIVE METABOLIC PANEL
ALT: 18 U/L (ref 0–44)
AST: 21 U/L (ref 15–41)
Albumin: 3.4 g/dL — ABNORMAL LOW (ref 3.5–5.0)
Alkaline Phosphatase: 67 U/L (ref 38–126)
Anion gap: 10 (ref 5–15)
BUN: 18 mg/dL (ref 8–23)
CO2: 24 mmol/L (ref 22–32)
Calcium: 9.1 mg/dL (ref 8.9–10.3)
Chloride: 103 mmol/L (ref 98–111)
Creatinine, Ser: 0.73 mg/dL (ref 0.44–1.00)
GFR, Estimated: >60 mL/min (ref >60)
Glucose, Bld: 131 mg/dL — ABNORMAL HIGH (ref 70–99)
Potassium: 3.6 mmol/L (ref 3.5–5.1)
Sodium: 137 mmol/L (ref 135–145)
Total Bilirubin: 1.1 mg/dL (ref 0.3–1.2)
Total Protein: 6.4 g/dL — ABNORMAL LOW (ref 6.5–8.1)

## 2020-05-01 LAB — CBC
HCT: 40.5 % (ref 36.0–46.0)
Hemoglobin: 12.9 g/dL (ref 12.0–15.0)
MCH: 29.9 pg (ref 26.0–34.0)
MCHC: 31.9 g/dL (ref 30.0–36.0)
MCV: 94 fL (ref 80.0–100.0)
Platelets: 179 10*3/uL (ref 150–400)
RBC: 4.31 MIL/uL (ref 3.87–5.11)
RDW: 12.8 % (ref 11.5–15.5)
WBC: 11.5 10*3/uL — ABNORMAL HIGH (ref 4.0–10.5)
nRBC: 0 % (ref 0.0–0.2)

## 2020-05-01 LAB — CBG MONITORING, ED: Glucose-Capillary: 126 mg/dL — ABNORMAL HIGH (ref 70–99)

## 2020-05-01 LAB — LIPASE, BLOOD: Lipase: 31 U/L (ref 11–51)

## 2020-05-01 MED ORDER — HYDROMORPHONE HCL 1 MG/ML IJ SOLN
1.0000 mg | Freq: Once | INTRAMUSCULAR | Status: AC
Start: 2020-05-01 — End: 2020-05-01
  Administered 2020-05-01: 1 mg via INTRAVENOUS
  Filled 2020-05-01: qty 1

## 2020-05-01 MED ORDER — HYDROMORPHONE HCL 1 MG/ML IJ SOLN
1.0000 mg | Freq: Once | INTRAMUSCULAR | Status: AC
  Administered 2020-05-01: 1 mg via INTRAVENOUS
  Filled 2020-05-01: qty 1

## 2020-05-01 MED ORDER — ONDANSETRON HCL 4 MG/2ML IJ SOLN
4.0000 mg | Freq: Once | INTRAMUSCULAR | Status: AC
  Administered 2020-05-01: 4 mg via INTRAVENOUS

## 2020-05-01 MED ORDER — ESMOLOL HCL-SODIUM CHLORIDE 2000 MG/100ML IV SOLN
25.0000 ug/kg/min | INTRAVENOUS | Status: DC
  Administered 2020-05-01: 25 ug/kg/min via INTRAVENOUS
  Administered 2020-05-02 (×3): 50 ug/kg/min via INTRAVENOUS
  Administered 2020-05-03: 25 ug/kg/min via INTRAVENOUS
  Filled 2020-05-01 (×6): qty 100

## 2020-05-01 MED ORDER — SODIUM CHLORIDE 0.9 % IV SOLN: INTRAVENOUS | Status: DC

## 2020-05-01 MED ORDER — ONDANSETRON HCL 4 MG/2ML IJ SOLN
4.0000 mg | Freq: Four times a day (QID) | INTRAMUSCULAR | Status: DC | PRN
  Administered 2020-05-02 – 2020-05-08 (×2): 4 mg via INTRAVENOUS
  Filled 2020-05-01 (×2): qty 2

## 2020-05-01 MED ORDER — LABETALOL HCL 5 MG/ML IV SOLN
10.0000 mg | Freq: Once | INTRAVENOUS | Status: AC
  Administered 2020-05-01: 10 mg via INTRAVENOUS
  Filled 2020-05-01: qty 4

## 2020-05-01 MED ORDER — DEXTROSE-NACL 5-0.45 % IV SOLN: INTRAVENOUS | Status: DC

## 2020-05-01 MED ORDER — MOMETASONE FURO-FORMOTEROL FUM 100-5 MCG/ACT IN AERO
2.0000 | INHALATION_SPRAY | Freq: Two times a day (BID) | RESPIRATORY_TRACT | Status: DC
  Administered 2020-05-02 – 2020-05-08 (×13): 2 via RESPIRATORY_TRACT
  Filled 2020-05-01 (×2): qty 8.8

## 2020-05-01 MED ORDER — SODIUM CHLORIDE 0.9 % IV BOLUS
500.0000 mL | Freq: Once | INTRAVENOUS | Status: AC
  Administered 2020-05-01: 500 mL via INTRAVENOUS

## 2020-05-01 MED ORDER — UMECLIDINIUM BROMIDE 62.5 MCG/INH IN AEPB
1.0000 | INHALATION_SPRAY | Freq: Every day | RESPIRATORY_TRACT | Status: DC
  Administered 2020-05-02 – 2020-05-05 (×4): 1 via RESPIRATORY_TRACT
  Filled 2020-05-01: qty 7

## 2020-05-01 MED ORDER — IOHEXOL 350 MG/ML SOLN
100.0000 mL | Freq: Once | INTRAVENOUS | Status: AC | PRN
  Administered 2020-05-01: 100 mL via INTRAVENOUS

## 2020-05-01 MED ORDER — DOCUSATE SODIUM 100 MG PO CAPS: 100.0000 mg | ORAL_CAPSULE | Freq: Two times a day (BID) | ORAL | Status: DC | PRN

## 2020-05-01 MED ORDER — PANTOPRAZOLE SODIUM 40 MG PO TBEC
40.0000 mg | DELAYED_RELEASE_TABLET | Freq: Every day | ORAL | Status: DC
  Administered 2020-05-02 – 2020-05-08 (×7): 40 mg via ORAL
  Filled 2020-05-01 (×7): qty 1

## 2020-05-01 MED ORDER — LABETALOL HCL 5 MG/ML IV SOLN
10.0000 mg | Freq: Once | INTRAVENOUS | Status: DC
  Filled 2020-05-01: qty 4

## 2020-05-01 MED ORDER — POLYETHYLENE GLYCOL 3350 17 G PO PACK: 17.0000 g | PACK | Freq: Every day | ORAL | Status: DC | PRN

## 2020-05-01 MED ORDER — CLEVIDIPINE BUTYRATE 0.5 MG/ML IV EMUL
0.0000 mg/h | INTRAVENOUS | Status: DC
  Administered 2020-05-01: 2 mg/h via INTRAVENOUS
  Administered 2020-05-02 (×2): 7 mg/h via INTRAVENOUS
  Administered 2020-05-02: 6 mg/h via INTRAVENOUS
  Administered 2020-05-02: 4 mg/h via INTRAVENOUS
  Administered 2020-05-02 (×3): 7 mg/h via INTRAVENOUS
  Administered 2020-05-03: 6 mg/h via INTRAVENOUS
  Filled 2020-05-01 (×5): qty 50
  Filled 2020-05-01: qty 100
  Filled 2020-05-01 (×8): qty 50

## 2020-05-01 MED ORDER — NICOTINE 21 MG/24HR TD PT24
21.0000 mg | MEDICATED_PATCH | Freq: Every day | TRANSDERMAL | Status: DC
  Administered 2020-05-02 – 2020-05-08 (×7): 21 mg via TRANSDERMAL
  Filled 2020-05-01 (×7): qty 1

## 2020-05-01 NOTE — ED Notes (Signed)
Out of room; pt in CT 

## 2020-05-01 NOTE — ED Provider Notes (Signed)
[facility] EMERGENCY DEPARTMENT Provider Note   CSN: [id number] Arrival date & time: 05/01/20  1547     History Chief Complaint  Patient presents with  . Chest Pain  . Back Pain    Angel Lane is a 70 y.o. female.  HPI   Patient was brought in by EMS for severe chest pain.  Patient states she started having severe pain in the mid thoracic region of her back.  Patient states the pain is severe.  It does not radiate to her arms or legs.  Patient is unable to get comfortable.  No position makes it better.  She took aspirin at home.  EMS gave her nitroglycerin and there is no improvement.  Patient denies any vomiting or diarrhea.  No fevers or chills.  No cough.  She denies having any similar symptoms in the past  Past Medical History:  Diagnosis Date  . Anemia   . Depression   . Dyspnea    due to stroke  . Hypertension   . 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 Sleep apnea   . Stroke [facility])    November 2018     Patient Active Problem List   Diagnosis Date Noted  . History of CVA (cerebrovascular accident) 02/19/2020  . OSA on CPAP 02/19/2020  . DOE (dyspnea on exertion) 02/19/2020  . Encounter for annual physical exam 02/19/2020  . Breast cancer screening by mammogram 02/19/2020  . Palpitations 02/19/2020  . Left hip pain 02/19/2020  . Avitaminosis D 07/02/2019  . Trochanteric bursitis of both hips 07/02/2019  . Impacted cerumen of right ear 07/02/2019  . Pure hypercholesterolemia 10/08/2017  . Hyperglycemia 10/08/2017  . Insomnia 03/23/2017  . Tobacco abuse disorder 01/15/2017  . Left hemiparesis ([place]) 01/06/2017  . HTN (hypertension) 01/06/2017    Past Surgical History:  Procedure Laterality Date  . ABDOMINAL SURGERY    . CARDIAC CATHETERIZATION    . CATARACT EXTRACTION W/PHACO Left 05/16/2017   Procedure: CATARACT EXTRACTION PHACO AND INTRAOCULAR LENS PLACEMENT ([place]) LEFT;  Surgeon: Leandrew Koyanagi, MD;   Location: [place];  Service: Ophthalmology;  Laterality: Left;  . CATARACT EXTRACTION W/PHACO Right 06/13/2017   Procedure: CATARACT EXTRACTION PHACO AND INTRAOCULAR LENS PLACEMENT ([place]) RIGHT;  Surgeon: Leandrew Koyanagi, MD;  Location: [place];  Service: Ophthalmology;  Laterality: Right;  sleep apnea  . CHOLECYSTECTOMY    . COLONOSCOPY N/A 01/21/2020   Procedure: COLONOSCOPY;  Surgeon: Toledo, Benay Pike, MD;  Location: [facility] ENDOSCOPY;  Service: Gastroenterology;  Laterality: N/A;     OB History   No obstetric history on file.     Family History  Problem Relation Age of Onset  . Asthma Mother   . Heart failure Mother   . Hypertension Mother   . Heart attack Father   . Breast cancer Maternal Aunt   . Breast cancer Cousin        maternal 1st cousin    Social History   Tobacco Use  . Smoking status: Current Every Day Smoker    Packs/day: 1.00    Years: 53.00    Pack years: 53.00    Types: Cigarettes  . Smokeless tobacco: Never Used  . Tobacco comment: Less than 1/2 PPD  Vaping Use  . Vaping Use: Former  Substance Use Topics  . Alcohol use: No  . Drug use: No    Home Medications Prior to Admission medications   Medication Sig Start Date  End Date Taking? Authorizing Provider  aspirin EC 325 MG tablet Take 1 tablet (325 mg total) by mouth daily. 01/16/20  Yes Bacigalupo, [name], MD  hydrochlorothiazide (HYDRODIURIL) 12.5 MG tablet Take 1 tablet (12.5 mg total) by mouth daily. 01/05/20  Yes Bacigalupo, [name], MD  rosuvastatin (CRESTOR) 5 MG tablet Take 1 tablet (5 mg total) by mouth daily. 02/19/20  Yes Bacigalupo, [name], MD  fluticasone (FLONASE) 50 MCG/ACT nasal spray Place 2 sprays daily into both nostrils. Patient not taking: No sig reported 01/09/17   Nicholes Mango, MD    Allergies    Penicillins  Review of Systems   Review of Systems  All other systems reviewed and are negative.   Physical Exam Updated Vital Signs BP (!)  159/89   Pulse 67   Resp 17   Ht 1.651 m (5\' 5" )   Wt 87.7 kg   SpO2 94%   BMI 32.17 kg/m   Physical Exam Vitals and nursing note reviewed.  Constitutional:      General: She is in acute distress.     Appearance: She is well-developed and well-nourished. She is ill-appearing.  HENT:     Head: Normocephalic and atraumatic.     Right Ear: External ear normal.     Left Ear: External ear normal.  Eyes:     General: No scleral icterus.       Right eye: No discharge.        Left eye: No discharge.     Conjunctiva/sclera: Conjunctivae normal.  Neck:     Trachea: No tracheal deviation.  Cardiovascular:     Rate and Rhythm: Normal rate and regular rhythm.     Pulses: Intact distal pulses.  Pulmonary:     Effort: Pulmonary effort is normal. No respiratory distress.     Breath sounds: Normal breath sounds. No stridor. No wheezing or rales.  Abdominal:     General: Bowel sounds are normal. There is no distension.     Palpations: Abdomen is soft.     Tenderness: There is abdominal tenderness. There is no guarding or rebound.     Comments: Tenderness palpation epigastric region, guarding  Musculoskeletal:        General: No tenderness or edema.     Cervical back: Neck supple.     Right lower leg: No tenderness. No edema.     Left lower leg: No tenderness. No edema.     Comments: Normal pulses bilateral radial and dorsalis pedis  Skin:    General: Skin is warm and dry.     Findings: No rash.  Neurological:     Mental Status: She is alert.     Cranial Nerves: No cranial nerve deficit (no facial droop, extraocular movements intact, no slurred speech).     Sensory: No sensory deficit.     Motor: No abnormal muscle tone or seizure activity.     Coordination: Coordination normal.     Deep Tendon Reflexes: Strength normal.  Psychiatric:        Mood and Affect: Mood and affect normal.     ED Results / Procedures / Treatments   Labs (all labs ordered are listed, but only abnormal  results are displayed) Labs Reviewed  CBC - Abnormal; Notable for the following components:      Result Value   WBC 11.5 (*)    All other components within normal limits  COMPREHENSIVE METABOLIC PANEL - Abnormal; Notable for the following components:   Glucose, Bld  131 (*)    Total Protein 6.4 (*)    Albumin 3.4 (*)    All other components within normal limits  CBG MONITORING, ED - Abnormal; Notable for the following components:   Glucose-Capillary 126 (*)    All other components within normal limits  RESP PANEL BY RT-PCR (FLU A&B, COVID) ARPGX2  LIPASE, BLOOD  TROPONIN I (HIGH SENSITIVITY)  TROPONIN I (HIGH SENSITIVITY)    EKG EKG Interpretation  Date/Time:  Saturday May 01 2020 15:59:22 EST Ventricular Rate:  95 PR Interval:    QRS Duration: 102 QT Interval:  391 QTC Calculation: 492 R Axis:   -4 Text Interpretation: Sinus rhythm Borderline prolonged QT interval No significant change since last tracing Confirmed by Dorie Rank [phone]) on 05/01/2020 4:06:35 PM   Radiology DG Chest Portable 1 View  Result Date: 05/01/2020 CLINICAL DATA:  Chest pain. EXAM: PORTABLE CHEST 1 VIEW COMPARISON:  Chest x-ray [id number], CT chest 11/27/2017 FINDINGS: More prominent ascending aorta likely due to AP portable technique as well as patient rotation. More prominent cardiac silhouette likely due to AP portable technique. Otherwise the heart size and mediastinal contours are unchanged. Bilateral upper lobe cystic changes consistent with emphysema. Left base patchy airspace opacities. No pulmonary edema. No pleural effusion. No pneumothorax. No acute osseous abnormality. IMPRESSION: Left base patchy airspace opacities could represent infection versus inflammation (aspiration pneumonia). Followup PA and lateral chest X-ray is recommended in 3-4 weeks following trial of antibiotic therapy to ensure resolution and exclude underlying malignancy. Electronically Signed   By: Iven Finn M.D.   On:  05/01/2020 16:36   CT Angio Chest/Abd/Pel for Dissection W and/or Wo Contrast  Result Date: 05/01/2020 CLINICAL DATA:  Abdominal and back pain. EXAM: CT ANGIOGRAPHY CHEST, ABDOMEN AND PELVIS TECHNIQUE: Non-contrast CT of the chest was initially obtained. Multidetector CT imaging through the chest, abdomen and pelvis was performed using the standard protocol during bolus administration of intravenous contrast. Multiplanar reconstructed images and MIPs were obtained and reviewed to evaluate the vascular anatomy. CONTRAST:  153mL OMNIPAQUE IOHEXOL 350 MG/ML SOLN COMPARISON:  November 27, 2017. FINDINGS: CTA CHEST FINDINGS Cardiovascular: There is interval development of type B thoracic aortic dissection that begins distal to the origin of the left subclavian artery. This dissection extends through the descending thoracic aorta and into the proximal abdominal aorta. 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 There is no significant stenosis involving the visualized great vessels. Atherosclerosis of thoracic aorta is noted. Normal cardiac size. No pericardial effusion. Mediastinum/Nodes: No enlarged mediastinal, hilar, or axillary lymph nodes. Thyroid gland, trachea, and esophagus demonstrate no significant findings. Lungs/Pleura: No pneumothorax or pleural effusion is noted. Emphysematous disease is noted bilaterally. Bilateral posterior basilar subsegmental atelectasis is noted. Musculoskeletal: No chest wall abnormality. No acute or significant osseous findings. Review of the MIP images confirms the above findings. CTA ABDOMEN AND PELVIS FINDINGS VASCULAR Aorta: Previously described thoracic aortic dissection is seen extending into the suprarenal abdominal aorta. Atherosclerosis of abdominal aorta is noted. No aneurysm is noted. Celiac: Patent without evidence of aneurysm, dissection, vasculitis or significant stenosis. SMA: Patent without evidence of aneurysm, dissection, vasculitis  or significant stenosis. Renals: Both renal arteries are patent without evidence of aneurysm, dissection, vasculitis, fibromuscular dysplasia or significant stenosis. IMA: Patent without evidence of aneurysm, dissection, vasculitis or significant stenosis. Inflow: Patent without evidence of aneurysm, dissection, vasculitis or significant stenosis. Veins: No obvious venous abnormality within the limitations of this arterial phase study. Review of the MIP images  confirms the above findings. NON-VASCULAR Hepatobiliary: Status post cholecystectomy. Large left hepatic cyst is noted. No biliary dilatation is noted. Pancreas: Unremarkable. No pancreatic ductal dilatation or surrounding inflammatory changes. Spleen: Normal in size without focal abnormality. Adrenals/Urinary Tract: Adrenal glands are unremarkable. Kidneys are normal, without renal calculi, focal lesion, or hydronephrosis. Bladder is unremarkable. Stomach/Bowel: Stomach is within normal limits. Appendix appears normal. No evidence of bowel wall thickening, distention, or inflammatory changes. Lymphatic: No adenopathy is noted. Reproductive: Uterus and bilateral adnexa are unremarkable. Other: No abdominal wall hernia or abnormality. No abdominopelvic ascites. Musculoskeletal: No acute or significant osseous findings. Review of the MIP images confirms the above findings. IMPRESSION: 1. Interval development of type B thoracic aortic dissection that begins distal to the origin of the left subclavian artery and extends through the descending thoracic aorta and into the proximal abdominal aorta. 2. 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 3. Large left hepatic cyst is noted. 4. Emphysema and aortic atherosclerosis. Aortic Atherosclerosis (ICD10-I70.0) and Emphysema (ICD10-J43.9). Electronically Signed   By: Marijo Conception M.D.   On: 05/01/2020 19:08    Procedures .Critical Care Performed by: Dorie Rank, MD Authorized by: Dorie Rank, MD   Critical care provider statement:    Critical care time (minutes):  45   Critical care was time spent personally by me on the following activities:  Discussions with consultants, evaluation of patient's response to treatment, examination of patient, ordering and performing treatments and interventions, ordering and review of laboratory studies, ordering and review of radiographic studies, pulse oximetry, re-evaluation of patient's condition, obtaining history from patient or surrogate and review of old charts     Medications Ordered in ED Medications  sodium chloride 0.9 % bolus 500 mL (0 mLs Intravenous Stopped 05/01/20 1725)    And  0.9 %  sodium chloride infusion ( Intravenous New Bag/Given 05/01/20 1705)  labetalol (NORMODYNE) injection 10 mg (has no administration in time range)  esmolol (BREVIBLOC) 2000 mg / 100 mL (20 mg/mL) infusion (has no administration in time range)  clevidipine (CLEVIPREX) infusion 0.5 mg/mL (has no administration in time range)  HYDROmorphone (DILAUDID) injection 1 mg (1 mg Intravenous Given 05/01/20 1606)  labetalol (NORMODYNE) injection 10 mg (10 mg Intravenous Given 05/01/20 1703)  HYDROmorphone (DILAUDID) injection 1 mg (1 mg Intravenous Given 05/01/20 1703)  iohexol (OMNIPAQUE) 350 MG/ML injection 100 mL (100 mLs Intravenous Contrast Given 05/01/20 1851)    ED Course  I have reviewed the triage vital signs and the nursing notes.  Pertinent labs & imaging results that were available during my care of the patient were reviewed by me and considered in my medical decision making (see chart for details).  Clinical Course as of 05/02/20 1106  Sat May 01, 2020  1700 Patient still having pain.  Additional pain meds ordered.  We will do this a dose of labetalol well for her hypertension [JK]  1830 Labs reviewed.  No signs of hepatitis, pancreatitis or other obvious abnormalities noted on labs.  Covid and flu screen are negative [JK]  1920 Patient CT scan  confirms an aortic dissection [JK]  1921 Esmolol drip ordered.  Will also order Cleviprex to titrate her blood pressure [JK]  1937 D/w Dr Oneida Alar.  Will consult on pt  [JK]  2019 BP improved.   112/66 HR 76 [JK]  2055 Discussed with Dr Janann Colonel, critical care.  One of the critical care team will be down to admit patient. [IR]  Clinical Course User Index [JK] Dorie Rank, MD   MDM Rules/Calculators/A&P                         Pt with severe pain, unable to get comfortable.  EKG without stemi.  NSTEMI, Aortic dissection, AAA, pancreatitis, perf viscous.  Will proceed with labs, CT imaging chest abd pelvis.  Patient's pain has improved slightly with medications.  Labetalol was administered for her blood pressure initially.  Patient CT scan does confirm the aortic dissection, type B.  Cleviprex and esmolol drip has been ordered.  I will consult with vascular surgery.  Plan on admission to the intensive urinary care unit for further treatment and stabilization.  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

## 2020-05-01 NOTE — H&P (Signed)
NAME:  Angel Lane, MRN:  [id number], DOB:  [birth year], LOS: 0 ADMISSION DATE:  05/01/2020, CONSULTATION DATE:  05/01/2020  REFERRING MD:  Dr Hillard Danker of [facility], CHIEF COMPLAINT:  Type B Aortic Dissection   Brief History:  Type B aortic dissection with hypertension  History of Present Illness:  70 year old female ongoing active smoker with 53 pack smoking history, sleep apnea on CPAP prior history of stroke with mild left-sided weakness in 2018, depression, anemia not otherwise specified.  She is on statin and aspirin at home.  Presented with several hours of upper abdominal and back pain associated with chest pain.  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  Also has aberrant right subclavian artery.  Started on esmolol and Cleviprex with improvement in blood pressure and symptoms.  Vascular service consulted 05/01/2020 and did not discover any evidence of endorgan damage.  Critical care medicine admitting 05/01/2020.  Recommended blood pressure goal of systolic less than 937 and heart rate less than 60  Vascular services also recommending -  Would consider repair if she has progressive enlargement of her aorta or endorgan compromise.  Most likely she would need right carotid subclavian bypass in addition to stent graft that she requires repair due to her aberrant right subclavian artery.  Past Medical History:    has a past medical history of Anemia, Depression, Dyspnea, Hypertension, Neuromuscular disorder ([place]), Sleep apnea, and Stroke ([place]).   reports that she has been smoking cigarettes. She has a 53.00 pack-year smoking history. She has never used smokeless tobacco.  Past Surgical History:  Procedure Laterality Date  . ABDOMINAL SURGERY    . CARDIAC CATHETERIZATION    . CATARACT EXTRACTION W/PHACO Left 05/16/2017   Procedure: CATARACT EXTRACTION  PHACO AND INTRAOCULAR LENS PLACEMENT ([place]) LEFT;  Surgeon: Leandrew Koyanagi, MD;  Location: [place];  Service: Ophthalmology;  Laterality: Left;  . CATARACT EXTRACTION W/PHACO Right 06/13/2017   Procedure: CATARACT EXTRACTION PHACO AND INTRAOCULAR LENS PLACEMENT ([place]) RIGHT;  Surgeon: Leandrew Koyanagi, MD;  Location: [place];  Service: Ophthalmology;  Laterality: Right;  sleep apnea  . CHOLECYSTECTOMY    . COLONOSCOPY N/A 01/21/2020   Procedure: COLONOSCOPY;  Surgeon: Toledo, Benay Pike, MD;  Location: [facility]OSCOPY;  Service: Gastroenterology;  Laterality: N/A;    Allergies  Allergen Reactions  . Penicillins Other (See Comments)    chills    Immunization History  Administered Date(s) Administered  . PFIZER Comirnaty(Gray Top)Covid-19 Tri-Sucrose Vaccine 04/17/2020  . PFIZER(Purple Top)SARS-COV-2 Vaccination 09/24/2019, 10/15/2019  . Pneumococcal Conjugate-13 03/23/2017  . Pneumococcal Polysaccharide-23 05/29/2019  . Tdap 10/08/2017    Family History  Problem Relation Age of Onset  . Asthma Mother   . Heart failure Mother   . Hypertension Mother   . Heart attack Father   . Breast cancer Maternal Aunt   . Breast cancer Cousin        maternal 1st cousin     Current Facility-Administered Medications:  .  [COMPLETED] sodium chloride 0.9 % bolus 500 mL, 500 mL, Intravenous, Once, Stopped at 05/01/20 1725 **AND** 0.9 %  sodium chloride infusion, , Intravenous, Continuous, Dorie Rank, MD, Last Rate: 125 mL/hr at 05/01/20 1705, New Bag at 05/01/20 1705 .  clevidipine (CLEVIPREX) infusion 0.5 mg/mL, 0-21 mg/hr, Intravenous, Continuous, Dorie Rank, MD, Last Rate: 12 mL/hr at 05/01/20 1954, 6 mg/hr at 05/01/20 1954 .  esmolol (BREVIBLOC) 2000 mg / 100 mL (20 mg/mL) infusion, 25-300 mcg/kg/min, Intravenous, Continuous, Dorie Rank, MD, Last Rate: 13.16 mL/hr at 05/01/20 2105, 50 mcg/kg/min at 05/01/20 2105 .  labetalol (NORMODYNE) injection 10 mg, 10 mg,  Intravenous, Once, Dorie Rank, MD  Current Outpatient Medications:  .  aspirin EC 325 MG tablet, Take 1 tablet (325 mg total) by mouth daily., Disp: 90 tablet, Rfl: 3 .  hydrochlorothiazide (HYDRODIURIL) 12.5 MG tablet, Take 1 tablet (12.5 mg total) by mouth daily., Disp: 90 tablet, Rfl: 0 .  rosuvastatin (CRESTOR) 5 MG tablet, Take 1 tablet (5 mg total) by mouth daily., Disp: 30 tablet, Rfl: 5 .  fluticasone (FLONASE) 50 MCG/ACT nasal spray, Place 2 sprays daily into both nostrils. (Patient not taking: No sig reported), Disp: 1 g, Rfl: 2   Significant Hospital Events:  05/01/2020 - admit   Consults:  05/01/2020 -vascular service consult  Procedures:  05/01/2020 -X  Significant Diagnostic Tests:  05/01/2020 -  IMPRESSION: 1. Interval development of type B thoracic aortic dissection that begins distal to the origin of the left subclavian artery and extends through the descending thoracic aorta and into the proximal abdominal aorta. 2. 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 3. Large left hepatic cyst is noted. 4. Emphysema and aortic atherosclerosis.   Micro Data:  X  Antimicrobials:  X  Interim History / Subjective:   05/01/2020 - seen in [facility], ER bed 12  Objective   Blood pressure 104/66, pulse 64, temperature (!) 97.5 F (36.4 C), temperature source Oral, resp. rate 12, height 5\' 5"  (1.651 m), weight 87.7 kg, SpO2 93 %.        Intake/Output Summary (Last 24 hours) at 05/01/2020 2131 Last data filed at 05/01/2020 1725 Gross per 24 hour  Intake 500 ml  Output --  Net 500 ml   Filed Weights   05/01/20 1601  Weight: 87.7 kg    Examination: General: Pleasant female lying comfortably in the stretcher in emergency room bed 12 HENT: CPAP on.  No elevated JVP no neck nodes Lungs: Clear to auscultation bilaterally Cardiovascular: Normal heart sounds.  Right radial pulses weaker Abdomen: Soft nontender Extremities: No cyanosis no  clubbing no edema Neuro: Moves all 4 extremities equally.  No focal neurologic deficits GU: Not examined  Resolved Hospital Problem list   X  Assessment & Plan:  ASSESSMENT / PLAN:  A:  Greater than 50 pack smoking history Clinical evidence of emphysema/COPD on CT scan History of sleep apnea on CPAP at home  05/01/2020 -> no respiratory compromise  P:   Bronchodilators - spiriva and dulera Quit smoking-nicotine patch while in the hospital CPAP nightly for baseline sleep apnea   A:   Stroke 2018 with mild residual left lower extremity weakness baseline  05/01/2020: Neurologically appears intact  P:   Close monitoring     A:   Aberrant right subclavian artery -congenital anomaly Type B aortic dissection distal to the left subclavian artery and ending in the proximal abdominal aorta -primary admission problem 05/01/2020  -Symptoms improved in the ER with Cleviprex and esmolol  P:  Systolic blood pressure goal less than 120 Heart rate goal less than 60 Repair if there is endorgan damage   A: At risk MI  P: Serial troponin Echo     A:  At risk acute kidney injury with type B aortic dissection  05/01/2020: No evidence of acute kidney injury but remains at risk  P:  Closely  monitor with good blood pressure control   A:  At risk electrolyte imbalance P: Magnesium goal greater than 2 Potassium goal between 4 and 5 Phosphorus goal is normal    A:   Vomiting x1 and admitted because of type B aortic dissection  P:   N.p.o. PPI zofran prn   A:  At risk for anemia of critical illness   P:  - PRBC for hgb </= 6.9gm%    - exceptions are   -  if ACS susepcted/confirmed then transfuse for hgb </= 8.0gm%,  or    -  active bleeding with hemodynamic instability, then transfuse regardless of hemoglobin value   At at all times try to transfuse 1 unit prbc as possible with exception of active hemorrhage    A At risk for  thrombocytopenia  P Monitor   A:   At risk for hypo and hyperglycemia P:   SSI   Best practice (evaluated daily)  Diet: npo except med Pain/Anxiety/Delirium protocol (if indicated): x VAP protocol (if indicated): hob . 30 DVT prophylaxis: scd and then if strable lovenox after d/w VVS GI prophylaxis: ppo Glucose control: ssi Mobility: bed rest Disposition:from ER to ICU  Goals of Care:   Family Updates: - d/w patient and husban 05/01/20 separately - full code    [place]   The patient CARITA SOLLARS is critically ill with multiple organ systems failure and requires high complexity decision making for assessment and support, frequent evaluation and titration of therapies, application of advanced monitoring technologies and extensive interpretation of multiple databases.   Critical Care Time devoted to patient care services described in this note is  60  Minutes. This time reflects time of care of this signee Dr Brand Males. This critical care time does not reflect procedure time, or teaching time or supervisory time of PA/NP/Med student/Med Resident etc but could involve care discussion time     Dr. Brand Males, M.D., [facility].C.P Pulmonary and Critical Care Medicine Staff Physician [facility] Pager: [phone], If no answer or between  15:00h - 7:00h: call [phone]  05/01/2020 9:31 PM    LABS    PULMONARY No results for input(s): PHART, PCO2ART, PO2ART, HCO3, TCO2, O2SAT in the last 168 hours.  Invalid input(s): PCO2, PO2  CBC Recent Labs  Lab 05/01/20 1610  HGB 12.9  HCT 40.5  WBC 11.5*  PLT 179    COAGULATION No results for input(s): INR in the last 168 hours.  CARDIAC  No results for input(s): TROPONINI in the last 168 hours. No results for input(s): PROBNP in the last 168 hours.   CHEMISTRY Recent Labs  Lab 05/01/20 1610  NA 137  K 3.6  CL 103  CO2 24  GLUCOSE  131*  BUN 18  CREATININE 0.73  CALCIUM 9.1   Estimated Creatinine Clearance: 70.6 mL/min (by C-G formula based on SCr of 0.73 mg/dL).   LIVER Recent Labs  Lab 05/01/20 1610  AST 21  ALT 18  ALKPHOS 67  BILITOT 1.1  PROT 6.4*  ALBUMIN 3.4*     INFECTIOUS No results for input(s): LATICACIDVEN, PROCALCITON in the last 168 hours.   ENDOCRINE CBG (last 3)  Recent Labs    05/01/20 1619  GLUCAP 126*         IMAGING x48h  - image(s) personally visualized  -   highlighted in bold DG Chest Portable 1 View  Result Date:  05/01/2020 CLINICAL DATA:  Chest pain. EXAM: PORTABLE CHEST 1 VIEW COMPARISON:  Chest x-ray [id number], CT chest 11/27/2017 FINDINGS: More prominent ascending aorta likely due to AP portable technique as well as patient rotation. More prominent cardiac silhouette likely due to AP portable technique. Otherwise the heart size and mediastinal contours are unchanged. Bilateral upper lobe cystic changes consistent with emphysema. Left base patchy airspace opacities. No pulmonary edema. No pleural effusion. No pneumothorax. No acute osseous abnormality. IMPRESSION: Left base patchy airspace opacities could represent infection versus inflammation (aspiration pneumonia). Followup PA and lateral chest X-ray is recommended in 3-4 weeks following trial of antibiotic therapy to ensure resolution and exclude underlying malignancy. Electronically Signed   By: Iven Finn M.D.   On: 05/01/2020 16:36   CT Angio Chest/Abd/Pel for Dissection W and/or Wo Contrast  Result Date: 05/01/2020 CLINICAL DATA:  Abdominal and back pain. EXAM: CT ANGIOGRAPHY CHEST, ABDOMEN AND PELVIS TECHNIQUE: Non-contrast CT of the chest was initially obtained. Multidetector CT imaging through the chest, abdomen and pelvis was performed using the standard protocol during bolus administration of intravenous contrast. Multiplanar reconstructed images and MIPs were obtained and reviewed to evaluate the vascular  anatomy. CONTRAST:  168mL OMNIPAQUE IOHEXOL 350 MG/ML SOLN COMPARISON:  November 27, 2017. FINDINGS: CTA CHEST FINDINGS Cardiovascular: There is interval development of type B thoracic aortic dissection that begins distal to the origin of the left subclavian artery. This dissection extends through the descending thoracic aorta and into the proximal abdominal aorta. 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 There is no significant stenosis involving the visualized great vessels. Atherosclerosis of thoracic aorta is noted. Normal cardiac size. No pericardial effusion. Mediastinum/Nodes: No enlarged mediastinal, hilar, or axillary lymph nodes. Thyroid gland, trachea, and esophagus demonstrate no significant findings. Lungs/Pleura: No pneumothorax or pleural effusion is noted. Emphysematous disease is noted bilaterally. Bilateral posterior basilar subsegmental atelectasis is noted. Musculoskeletal: No chest wall abnormality. No acute or significant osseous findings. Review of the MIP images confirms the above findings. CTA ABDOMEN AND PELVIS FINDINGS VASCULAR Aorta: Previously described thoracic aortic dissection is seen extending into the suprarenal abdominal aorta. Atherosclerosis of abdominal aorta is noted. No aneurysm is noted. Celiac: Patent without evidence of aneurysm, dissection, vasculitis or significant stenosis. SMA: Patent without evidence of aneurysm, dissection, vasculitis or significant stenosis. Renals: Both renal arteries are patent without evidence of aneurysm, dissection, vasculitis, fibromuscular dysplasia or significant stenosis. IMA: Patent without evidence of aneurysm, dissection, vasculitis or significant stenosis. Inflow: Patent without evidence of aneurysm, dissection, vasculitis or significant stenosis. Veins: No obvious venous abnormality within the limitations of this arterial phase study. Review of the MIP images confirms the above findings. NON-VASCULAR  Hepatobiliary: Status post cholecystectomy. Large left hepatic cyst is noted. No biliary dilatation is noted. Pancreas: Unremarkable. No pancreatic ductal dilatation or surrounding inflammatory changes. Spleen: Normal in size without focal abnormality. Adrenals/Urinary Tract: Adrenal glands are unremarkable. Kidneys are normal, without renal calculi, focal lesion, or hydronephrosis. Bladder is unremarkable. Stomach/Bowel: Stomach is within normal limits. Appendix appears normal. No evidence of bowel wall thickening, distention, or inflammatory changes. Lymphatic: No adenopathy is noted. Reproductive: Uterus and bilateral adnexa are unremarkable. Other: No abdominal wall hernia or abnormality. No abdominopelvic ascites. Musculoskeletal: No acute or significant osseous findings. Review of the MIP images confirms the above findings. IMPRESSION: 1. Interval development of type B thoracic aortic dissection that begins distal to the origin of the left subclavian artery and extends through the descending thoracic aorta and into the  proximal abdominal aorta. 2. 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 3. Large left hepatic cyst is noted. 4. Emphysema and aortic atherosclerosis. Aortic Atherosclerosis (ICD10-I70.0) and Emphysema (ICD10-J43.9). Electronically Signed   By: Marijo Conception M.D.   On: 05/01/2020 19:08

## 2020-05-01 NOTE — Consult Note (Signed)
Referring Physician: Dr. Mary Sella, ER  [name] MRN: [id number] DOB: 11/30/1950 Sex: female  REASON FOR CONSULT: Type B aortic dissection  HPI: OLLA DELANCEY is a 70 y.o. female, a several hour history of sudden onset chest and upper abdominal/back pain.  Patient's pain is fairly minimal at this point with pain medication and blood pressure control.  She does have a prior history of stroke.  She also has a history of hypertension.  She does not complain of any significant abdominal pain or pain in the lower extremities.  She has no numbness or tingling in her arms or legs.  She is on a statin and aspirin.  Residual from her stroke is subtle left leg weakness.  Past Medical History:  Diagnosis Date  . Anemia   . Depression   . Dyspnea    due to stroke  . Hypertension   . Neuromuscular disorder ([place])    left sided weakness from stroke  . Sleep apnea   . Stroke [facility])    November 2018    Past Surgical History:  Procedure Laterality Date  . ABDOMINAL SURGERY    . CARDIAC CATHETERIZATION    . CATARACT EXTRACTION W/PHACO Left 05/16/2017   Procedure: CATARACT EXTRACTION PHACO AND INTRAOCULAR LENS PLACEMENT ([place]) LEFT;  Surgeon: Leandrew Koyanagi, MD;  Location: [place];  Service: Ophthalmology;  Laterality: Left;  . CATARACT EXTRACTION W/PHACO Right 06/13/2017   Procedure: CATARACT EXTRACTION PHACO AND INTRAOCULAR LENS PLACEMENT ([place]) RIGHT;  Surgeon: Leandrew Koyanagi, MD;  Location: [place];  Service: Ophthalmology;  Laterality: Right;  sleep apnea  . CHOLECYSTECTOMY    . COLONOSCOPY N/A 01/21/2020   Procedure: COLONOSCOPY;  Surgeon: Toledo, Benay Pike, MD;  Location: [facility] ENDOSCOPY;  Service: Gastroenterology;  Laterality: N/A;    Family History  Problem Relation Age of Onset  . Asthma Mother   . Heart failure Mother   . Hypertension Mother   . Heart attack Father   . Breast cancer Maternal Aunt   . Breast cancer Cousin         maternal 1st cousin    SOCIAL HISTORY: Social History   Socioeconomic History  . Marital status: Married    Spouse name: Not on file  . Number of children: Not on file  . Years of education: Not on file  . Highest education level: Not on file  Occupational History  . Not on file  Tobacco Use  . Smoking status: Current Every Day Smoker    Packs/day: 1.00    Years: 53.00    Pack years: 53.00    Types: Cigarettes  . Smokeless tobacco: Never Used  . Tobacco comment: Less than 1/2 PPD  Vaping Use  . Vaping Use: Former  Substance and Sexual Activity  . Alcohol use: No  . Drug use: No  . Sexual activity: Yes  Other Topics Concern  . Not on file  Social History Narrative  . Not on file   Social Determinants of Health   Financial Resource Strain: Not on file  Food Insecurity: Not on file  Transportation Needs: Not on file  Physical Activity: Not on file  Stress: Not on file  Social Connections: Not on file  Intimate Partner Violence: Not on file    Allergies  Allergen Reactions  . Penicillins Other (See Comments)    chills    Current Facility-Administered Medications  Medication Dose Route Frequency Provider Last Rate Last Admin  . 0.9 %  sodium chloride infusion   Intravenous Continuous Dorie Rank, MD 125 mL/hr at 05/01/20 1705 New Bag at 05/01/20 1705  . clevidipine (CLEVIPREX) infusion 0.5 mg/mL  0-21 mg/hr Intravenous Continuous Dorie Rank, MD 12 mL/hr at 05/01/20 1954 6 mg/hr at 05/01/20 1954  . esmolol (BREVIBLOC) 2000 mg / 100 mL (20 mg/mL) infusion  25-300 mcg/kg/min Intravenous Continuous Dorie Rank, MD 13.16 mL/hr at 05/01/20 2105 50 mcg/kg/min at 05/01/20 2105  . labetalol (NORMODYNE) injection 10 mg  10 mg Intravenous Once Dorie Rank, MD       Current Outpatient Medications  Medication Sig Dispense Refill  . aspirin EC 325 MG tablet Take 1 tablet (325 mg total) by mouth daily. 90 tablet 3  . hydrochlorothiazide (HYDRODIURIL) 12.5 MG tablet Take 1  tablet (12.5 mg total) by mouth daily. 90 tablet 0  . rosuvastatin (CRESTOR) 5 MG tablet Take 1 tablet (5 mg total) by mouth daily. 30 tablet 5  . fluticasone (FLONASE) 50 MCG/ACT nasal spray Place 2 sprays daily into both nostrils. (Patient not taking: No sig reported) 1 g 2    ROS:   General:  No weight loss, Fever, chills  HEENT: No recent headaches, no nasal bleeding, no visual changes, no sore throat  Neurologic: No dizziness, blackouts, seizures. No recent symptoms of stroke or mini- stroke. No recent episodes of slurred speech, or temporary blindness.  Cardiac: No recent episodes of chest pain/pressure, no shortness of breath at rest.  No shortness of breath with exertion.  Denies history of atrial fibrillation or irregular heartbeat  Vascular: No history of rest pain in feet.  No history of claudication.  No history of non-healing ulcer, No history of DVT   Pulmonary: No home oxygen, no productive cough, no hemoptysis,  No asthma or wheezing  Musculoskeletal:  [ ]  Arthritis, [ ]  Low back pain,  [ ]  Joint pain  Hematologic:No history of hypercoagulable state.  No history of easy bleeding.  No history of anemia  Gastrointestinal: No hematochezia or melena,  No gastroesophageal reflux, no trouble swallowing  Urinary: [ ]  chronic Kidney disease, [ ]  on HD - [ ]  MWF or [ ]  TTHS, [ ]  Burning with urination, [ ]  Frequent urination, [ ]  Difficulty urinating;   Skin: No rashes  Psychological: No history of anxiety,  No history of depression   Physical Examination  Vitals:   05/01/20 2030 05/01/20 2042 05/01/20 2100 05/01/20 2115  BP: 115/66  105/70 104/66  Pulse: 69 73 66 64  Resp: 16  11 12   Temp:      TempSrc:      SpO2: (!) 88% 92% 91% 93%  Weight:      Height:        Body mass index is 32.17 kg/m.  General:  Alert and oriented, no acute distress HEENT: Normal Neck: No JVD Cardiac: Regular Rate and Rhythm Abdomen: Soft, non-tender, non-distended, no mass Skin:  No rash Extremity Pulses:  2+ radial, brachial, femoral, dorsalis pedis pulses bilaterally Musculoskeletal: No deformity or edema  Neurologic: Upper and lower extremity motor 5/5 and symmetric  DATA:  CT angio of the chest abdomen and pelvis is reviewed.  This shows an aortic dissection just distal to the origin of the left subclavian artery.  She has an apparent right subclavian artery which comes off about 4 cm below this.  There is no compromise of this.  Aortic dissection and is just below the superior mesenteric artery.  However the celiac SMA and renal arteries  all come off the true lumen.  Maximum aortic diameter is 37 mm a few centimeters above the diaphragm.  There is no infrarenal abdominal aortic aneurysm.  There is no compromise of the iliac arteries.  CBC    Component Value Date/Time   WBC 11.5 (H) 05/01/2020 1610   RBC 4.31 05/01/2020 1610   HGB 12.9 05/01/2020 1610   HGB 13.4 02/19/2020 1604   HCT 40.5 05/01/2020 1610   HCT 40.8 02/19/2020 1604   PLT 179 05/01/2020 1610   PLT 200 02/19/2020 1604   MCV 94.0 05/01/2020 1610   MCV 91 02/19/2020 1604   MCV 90 10/29/2011 1553   MCH 29.9 05/01/2020 1610   MCHC 31.9 05/01/2020 1610   RDW 12.8 05/01/2020 1610   RDW 12.3 02/19/2020 1604   RDW 13.2 10/29/2011 1553   LYMPHSABS 2.5 01/05/2017 2328   MONOABS 0.6 01/05/2017 2328   EOSABS 0.2 01/05/2017 2328   BASOSABS 0.0 01/05/2017 2328    BMET    Component Value Date/Time   NA 137 05/01/2020 1610   NA 141 02/19/2020 1604   NA 141 10/29/2011 1553   K 3.6 05/01/2020 1610   K 3.3 (L) 10/29/2011 1553   CL 103 05/01/2020 1610   CL 108 (H) 10/29/2011 1553   CO2 24 05/01/2020 1610   CO2 26 10/29/2011 1553   GLUCOSE 131 (H) 05/01/2020 1610   GLUCOSE 104 (H) 10/29/2011 1553   BUN 18 05/01/2020 1610   BUN 17 02/19/2020 1604   BUN 18 10/29/2011 1553   CREATININE 0.73 05/01/2020 1610   CREATININE 0.87 10/29/2011 1553   CALCIUM 9.1 05/01/2020 1610   CALCIUM 9.1 10/29/2011  1553   GFRNONAA >60 05/01/2020 1610   GFRNONAA >60 10/29/2011 1553   GFRAA 78 02/19/2020 1604   GFRAA >60 10/29/2011 1553     ASSESSMENT: Type B aortic dissection currently no end organ compromise no evidence of upper or lower extremity ischemia.  Would consider repair if she has progressive enlargement of her aorta or endorgan compromise.  Most likely she would need right carotid subclavian bypass in addition to stent graft that she requires repair due to her aberrant right subclavian artery.  Discussed with patient but usually medical management is the treatment course for type B aortic dissections.   PLAN: Discussed with Dr. Tomi Bamberger.  He has alerted the critical care service about the patient's aortic dissection for them to admit the patient for blood pressure control to the ICU.  We will follow.  We will plan to repeat her CT scan in 24 to 48 hours.  If no significant change will get follow-up scan in 1 month.  Patient needs tight blood pressure control with a goal of heart rate less than 60 and systolic blood pressure less than 120.  Smoking cessation is paramount.   [name], MD [facility] Office: [phone]

## 2020-05-01 NOTE — ED Triage Notes (Signed)
Pt BIB [facility] for c/o nausea and chest pain radiating into back that started this am. Pt took 81mg  ASA x 4 at home. EMS gave nitro SL x 3; no relief of pain. Pt hypertensive.

## 2020-05-02 ENCOUNTER — Encounter (HOSPITAL_COMMUNITY): Payer: Self-pay | Admitting: Internal Medicine

## 2020-05-02 ENCOUNTER — Inpatient Hospital Stay (HOSPITAL_COMMUNITY): Payer: Medicare Other

## 2020-05-02 DIAGNOSIS — I351 Nonrheumatic aortic (valve) insufficiency: Secondary | ICD-10-CM | POA: Diagnosis not present

## 2020-05-02 DIAGNOSIS — I7101 Dissection of thoracic aorta: Secondary | ICD-10-CM | POA: Diagnosis not present

## 2020-05-02 LAB — ECHOCARDIOGRAM COMPLETE
AV Mean grad: 4.8 mmHg
AV Peak grad: 7.2 mmHg
Ao pk vel: 1.34 m/s
Area-P 1/2: 4.89 cm2
Height: 65 in
P 1/2 time: 382 msec
S' Lateral: 2.8 cm
Weight: 3089.97 oz

## 2020-05-02 LAB — BASIC METABOLIC PANEL
Anion gap: 9 (ref 5–15)
BUN: 14 mg/dL (ref 8–23)
CO2: 23 mmol/L (ref 22–32)
Calcium: 8.9 mg/dL (ref 8.9–10.3)
Chloride: 104 mmol/L (ref 98–111)
Creatinine, Ser: 0.69 mg/dL (ref 0.44–1.00)
GFR, Estimated: >60 mL/min (ref >60)
Glucose, Bld: 148 mg/dL — ABNORMAL HIGH (ref 70–99)
Potassium: 4 mmol/L (ref 3.5–5.1)
Sodium: 136 mmol/L (ref 135–145)

## 2020-05-02 LAB — MRSA PCR SCREENING: MRSA by PCR: NEGATIVE

## 2020-05-02 LAB — PHOSPHORUS: Phosphorus: 3.4 mg/dL (ref 2.5–4.6)

## 2020-05-02 LAB — MAGNESIUM: Magnesium: 1.9 mg/dL (ref 1.7–2.4)

## 2020-05-02 MED ORDER — CARVEDILOL 12.5 MG PO TABS
12.5000 mg | ORAL_TABLET | Freq: Two times a day (BID) | ORAL | Status: DC
  Administered 2020-05-02 – 2020-05-03 (×2): 12.5 mg via ORAL
  Filled 2020-05-02 (×2): qty 1

## 2020-05-02 MED ORDER — ORAL CARE MOUTH RINSE
15.0000 mL | Freq: Two times a day (BID) | OROMUCOSAL | Status: DC
  Administered 2020-05-02 – 2020-05-08 (×14): 15 mL via OROMUCOSAL

## 2020-05-02 MED ORDER — SODIUM CHLORIDE 0.9% FLUSH
10.0000 mL | INTRAVENOUS | Status: DC | PRN
  Administered 2020-05-05: 10 mL

## 2020-05-02 MED ORDER — AMLODIPINE BESYLATE 10 MG PO TABS
10.0000 mg | ORAL_TABLET | Freq: Every day | ORAL | Status: DC
  Filled 2020-05-02: qty 1

## 2020-05-02 MED ORDER — FENTANYL CITRATE (PF) 100 MCG/2ML IJ SOLN
50.0000 ug | INTRAMUSCULAR | Status: DC | PRN
  Administered 2020-05-02: 100 ug via INTRAVENOUS
  Administered 2020-05-02: 50 ug via INTRAVENOUS
  Administered 2020-05-02 – 2020-05-03 (×7): 100 ug via INTRAVENOUS
  Filled 2020-05-02 (×10): qty 2

## 2020-05-02 MED ORDER — OXYCODONE HCL 5 MG PO TABS
5.0000 mg | ORAL_TABLET | ORAL | Status: DC | PRN
  Administered 2020-05-02 (×2): 10 mg via ORAL
  Administered 2020-05-02 – 2020-05-03 (×3): 5 mg via ORAL
  Administered 2020-05-04 (×2): 10 mg via ORAL
  Filled 2020-05-02: qty 1
  Filled 2020-05-02: qty 2
  Filled 2020-05-02 (×2): qty 1
  Filled 2020-05-02: qty 2
  Filled 2020-05-02: qty 1
  Filled 2020-05-02: qty 2
  Filled 2020-05-02: qty 1
  Filled 2020-05-02: qty 2

## 2020-05-02 MED ORDER — CHLORHEXIDINE GLUCONATE CLOTH 2 % EX PADS
6.0000 | MEDICATED_PAD | Freq: Every day | CUTANEOUS | Status: DC
  Administered 2020-05-02 – 2020-05-07 (×5): 6 via TOPICAL

## 2020-05-02 MED ORDER — SODIUM CHLORIDE 0.9% FLUSH
10.0000 mL | Freq: Two times a day (BID) | INTRAVENOUS | Status: DC
  Administered 2020-05-02 – 2020-05-08 (×7): 10 mL

## 2020-05-02 MED ORDER — FENTANYL CITRATE (PF) 100 MCG/2ML IJ SOLN
25.0000 ug | INTRAMUSCULAR | Status: DC | PRN
  Administered 2020-05-02: 50 ug via INTRAVENOUS
  Administered 2020-05-02 (×2): 25 ug via INTRAVENOUS
  Filled 2020-05-02 (×3): qty 2

## 2020-05-02 MED ORDER — OXYCODONE HCL 5 MG PO TABS: 5.0000 mg | ORAL_TABLET | ORAL | Status: DC | PRN

## 2020-05-02 NOTE — Progress Notes (Signed)
Pt said she does not want to wear her CPAP tonight. Told her let the RN know if she changes her mind.

## 2020-05-02 NOTE — Progress Notes (Signed)
[facility] [place]  Subjective  - pain improved this morning still mainly chest and back   Objective 119/67 75 (!) 96.9 F (36.1 C) (Axillary) 17 96%  Intake/Output Summary (Last 24 hours) at 05/02/2020 0819 Last data filed at 05/02/2020 0700 Gross per 24 hour  Intake 1149.02 ml  Output 500 ml  Net 649.02 ml   Abdomen soft non tender 2+ DP right 2+ PT left  Assessment/Planning: Type B dissection stable overnight most likely re image on 3/1  Keep SBP < 120  [name] 05/02/2020 8:19 AM --  Laboratory Lab Results: Recent Labs    05/01/20 1610  WBC 11.5*  HGB 12.9  HCT 40.5  PLT 179   BMET Recent Labs    05/01/20 1610 05/02/20 0349  NA 137 136  K 3.6 4.0  CL 103 104  CO2 24 23  GLUCOSE 131* 148*  BUN 18 14  CREATININE 0.73 0.69  CALCIUM 9.1 8.9    COAG Lab Results  Component Value Date   INR 0.96 01/05/2017   No results found for: PTT

## 2020-05-02 NOTE — Progress Notes (Signed)
Pt has cpap at bedside but states she doesn't always wear hers at home and does not want to put it on at this time.  Pt is aware she can request to be placed on it at anytime.  RN aware as well.  RT will continue to monitor.

## 2020-05-02 NOTE — Progress Notes (Signed)
NAME:  Angel Lane, MRN:  [id number], DOB:  05-10-1950, LOS: 1 ADMISSION DATE:  05/01/2020, CONSULTATION DATE:  05/01/2020  REFERRING MD:  Dr Hillard Danker of [facility], CHIEF COMPLAINT:  Type B Aortic Dissection   Brief History:  Type B aortic dissection with hypertension  History of Present Illness:  70 year old female ongoing active smoker with 53 pack smoking history, sleep apnea on CPAP prior history of stroke with mild left-sided weakness in 2018, depression, anemia not otherwise specified.  She is on statin and aspirin at home.  Presented with several hours of upper abdominal and back pain associated with chest pain.  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  Also has aberrant right subclavian artery.  Started on esmolol and Cleviprex with improvement in blood pressure and symptoms.  Vascular service consulted 05/01/2020 and did not discover any evidence of endorgan damage.  Critical care medicine admitting 05/01/2020.  Recommended blood pressure goal of systolic less than 099 and heart rate less than 60  Vascular services also recommending -  Would consider repair if she has progressive enlargement of her aorta or endorgan compromise.  Most likely she would need right carotid subclavian bypass in addition to stent graft that she requires repair due to her aberrant right subclavian artery.  Past Medical History:    has a past medical history of Anemia, Depression, Dyspnea, Hypertension, Neuromuscular disorder ([place]), Sleep apnea, and Stroke ([place]).   reports that she has been smoking cigarettes. She has a 53.00 pack-year smoking history. She has never used smokeless tobacco.  Past Surgical History:  Procedure Laterality Date  . ABDOMINAL SURGERY    . CARDIAC CATHETERIZATION    . CATARACT EXTRACTION W/PHACO Left 05/16/2017   Procedure: CATARACT EXTRACTION  PHACO AND INTRAOCULAR LENS PLACEMENT ([place]) LEFT;  Surgeon: Leandrew Koyanagi, MD;  Location: [facility];  Service: Ophthalmology;  Laterality: Left;  . CATARACT EXTRACTION W/PHACO Right 06/13/2017   Procedure: CATARACT EXTRACTION PHACO AND INTRAOCULAR LENS PLACEMENT ([place]) RIGHT;  Surgeon: Leandrew Koyanagi, MD;  Location: [facility];  Service: Ophthalmology;  Laterality: Right;  sleep apnea  . CHOLECYSTECTOMY    . COLONOSCOPY N/A 01/21/2020   Procedure: COLONOSCOPY;  Surgeon: Toledo, Benay Pike, MD;  Location: [facility];  Service: Gastroenterology;  Laterality: N/A;    Allergies  Allergen Reactions  . Penicillins Other (See Comments)    chills    Immunization History  Administered Date(s) Administered  . PFIZER Comirnaty(Gray Top)Covid-19 Tri-Sucrose Vaccine 04/17/2020  . PFIZER(Purple Top)SARS-COV-2 Vaccination 09/24/2019, 10/15/2019  . Pneumococcal Conjugate-13 03/23/2017  . Pneumococcal Polysaccharide-23 05/29/2019  . Tdap 10/08/2017    Family History  Problem Relation Age of Onset  . Asthma Mother   . Heart failure Mother   . Hypertension Mother   . Heart attack Father   . Breast cancer Maternal Aunt   . Breast cancer Cousin        maternal 1st cousin     Current Facility-Administered Medications:  .  amLODipine (NORVASC) tablet 10 mg, 10 mg, Oral, Daily, Reinaldo Helt L, DO .  carvedilol (COREG) tablet 12.5 mg, 12.5 mg, Oral, BID WC, Glenford Garis L, DO .  Chlorhexidine Gluconate Cloth 2 % PADS 6 each, 6 each, Topical, Daily, Anders Simmonds, MD, 6 each at 05/02/20 0050 .  clevidipine (CLEVIPREX) infusion 0.5 mg/mL, 0-21 mg/hr, Intravenous, Continuous, Dorie Rank, MD, Last  Rate: 14 mL/hr at 05/02/20 0700, 7 mg/hr at 05/02/20 0700 .  docusate sodium (COLACE) capsule 100 mg, 100 mg, Oral, BID PRN, Brand Males, MD .  esmolol (BREVIBLOC) 2000 mg / 100 mL (20 mg/mL) infusion, 25-300 mcg/kg/min, Intravenous, Continuous, Dorie Rank, MD, Last  Rate: 13.16 mL/hr at 05/02/20 0700, 50 mcg/kg/min at 05/02/20 0700 .  fentaNYL (SUBLIMAZE) injection 50-100 mcg, 50-100 mcg, Intravenous, Q1H PRN, Anders Simmonds, MD, 100 mcg at 05/02/20 [phone] .  labetalol (NORMODYNE) injection 10 mg, 10 mg, Intravenous, Once, Dorie Rank, MD .  MEDLINE mouth rinse, 15 mL, Mouth Rinse, BID, Anders Simmonds, MD, 15 mL at 05/02/20 0145 .  mometasone-formoterol (DULERA) 100-5 MCG/ACT inhaler 2 puff, 2 puff, Inhalation, BID, Brand Males, MD, 2 puff at 05/02/20 0817 .  nicotine (NICODERM CQ - dosed in mg/24 hours) patch 21 mg, 21 mg, Transdermal, Daily, Chase Caller, Murali, MD .  ondansetron (ZOFRAN) injection 4 mg, 4 mg, Intravenous, Q6H PRN, Brand Males, MD .  oxyCODONE (Oxy IR/ROXICODONE) immediate release tablet 5-10 mg, 5-10 mg, Oral, Q4H PRN, Darrelle Barrell L, DO, 5 mg at 05/02/20 0854 .  pantoprazole (PROTONIX) EC tablet 40 mg, 40 mg, Oral, Daily, Ramaswamy, Murali, MD .  polyethylene glycol (MIRALAX / GLYCOLAX) packet 17 g, 17 g, Oral, Daily PRN, Chase Caller, Murali, MD .  umeclidinium bromide (INCRUSE ELLIPTA) 62.5 MCG/INH 1 puff, 1 puff, Inhalation, Daily, Brand Males, MD, 1 puff at 05/02/20 0816   Significant Hospital Events:  05/01/2020 - admit  Consults:  05/01/2020 -vascular service consult  Procedures:  05/01/2020 -X  Significant Diagnostic Tests:  05/01/2020 -  IMPRESSION: 1. Interval development of type B thoracic aortic dissection that begins distal to the origin of the left subclavian artery and extends through the descending thoracic aorta and into the proximal abdominal aorta. 2. 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 3. Large left hepatic cyst is noted. 4. Emphysema and aortic atherosclerosis.  Micro Data:  X  Antimicrobials:  X  Interim History / Subjective:   05/02/2020 - seen in [facility], ER bed 12  Objective   Blood pressure 119/67, pulse 85, temperature (!) 96.9 F (36.1  C), temperature source Axillary, resp. rate (!) 21, height 5\' 5"  (1.651 m), weight 87.6 kg, SpO2 90 %.        Intake/Output Summary (Last 24 hours) at 05/02/2020 0930 Last data filed at 05/02/2020 0700 Gross per 24 hour  Intake 1149.02 ml  Output 500 ml  Net 649.02 ml   Filed Weights   05/01/20 1601 05/02/20 0359  Weight: 87.7 kg 87.6 kg    Examination: General: Elderly female in ICU bed HENT: NCAT, tracking appropriately Lungs: Clear to auscultation bilaterally no crackles no wheeze Cardiovascular: Regular rate rhythm, S1-S2 Abdomen: Soft nontender nondistended Extremities: No cyanosis no clubbing no edema Neuro: Moves all 4 extremities no focal deficit GU: Deferred  Resolved Hospital Problem list   X  Assessment & Plan:   Type B aortic dissection distal to the left subclavian artery and ending in the proximal abdominal aorta -present on admission 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 Appreciate vascular surgery consultation  Stroke 2018 with mild residual left lower extremity weakness baseline P:   Close monitoring  Greater than 50 pack smoking history Clinical evidence of emphysema/COPD on CT scan History of sleep apnea on CPAP at home P:   Bronchodilators - spiriva and dulera Quit smoking-nicotine patch while in the hospital CPAP nightly for baseline sleep apnea  At risk acute kidney injury with type B aortic dissection P:  Avoid nephrotoxic drugs Hold ACE inhibitor or ARB  At risk electrolyte imbalance P: Magnesium goal greater than 2 Potassium goal between 4 and 5 Phosphorus goal is normal  Vomiting x1 and admitted because of type B aortic dissection P:   N.p.o.  at this time, can likely advance diet as tolerated  At risk for anemia of critical illness P:  Conservative transfusion threshold for hemoglobin less than 7  At risk for thrombocytopenia P Monitor  At risk for hypo and hyperglycemia P:   SSI   Best practice (evaluated daily)  Diet: npo except med Pain/Anxiety/Delirium protocol (if indicated): x VAP protocol (if indicated): hob . 30 DVT prophylaxis: scd and then if strable lovenox after d/w VVS GI prophylaxis: ppo Glucose control: ssi Mobility: bed rest Disposition:from ER to ICU  Goals of Care:   Family Updates: - d/w patient and husban 05/01/20 separately - full code  LABS    PULMONARY No results for input(s): PHART, PCO2ART, PO2ART, HCO3, TCO2, O2SAT in the last 168 hours.  Invalid input(s): PCO2, PO2  CBC Recent Labs  Lab 05/01/20 1610  HGB 12.9  HCT 40.5  WBC 11.5*  PLT 179    COAGULATION No results for input(s): INR in the last 168 hours.  CARDIAC  No results for input(s): TROPONINI in the last 168 hours. No results for input(s): PROBNP in the last 168 hours.   CHEMISTRY Recent Labs  Lab 05/01/20 1610 05/02/20 0349  NA 137 136  K 3.6 4.0  CL 103 104  CO2 24 23  GLUCOSE 131* 148*  BUN 18 14  CREATININE 0.73 0.69  CALCIUM 9.1 8.9  MG  --  1.9  PHOS  --  3.4   Estimated Creatinine Clearance: 70.5 mL/min (by C-G formula based on SCr of 0.69 mg/dL).   LIVER Recent Labs  Lab 05/01/20 1610  AST 21  ALT 18  ALKPHOS 67  BILITOT 1.1  PROT 6.4*  ALBUMIN 3.4*     INFECTIOUS No results for input(s): LATICACIDVEN, PROCALCITON in the last 168 hours.   ENDOCRINE CBG (last 3)  Recent Labs    05/01/20 1619  GLUCAP 126*    This patient is critically ill with multiple organ system failure; which, requires frequent high complexity decision making, assessment, support, evaluation, and titration of therapies. This was completed through the application of advanced monitoring technologies  and extensive interpretation of multiple databases. During this encounter critical care time was devoted to patient care services described in this note for 31 minutes.  [facility] 05/02/2020 9:31 AM

## 2020-05-02 NOTE — Progress Notes (Signed)
eLink Physician-Brief Progress Note [name] DOB: 01/01/1951 MRN: [id number]   Date of Service  05/02/2020  HPI/Events of Note  Patient c/o back and chest pain. Likely related to Type B Aortic Dissection.   eICU Interventions  Plan: 1. Fentanyl 25-50 mcg IV Q 2 hours PRN severe pain.      Intervention Category Major Interventions: Other:  Lysle Dingwall 05/02/2020, 12:45 AM

## 2020-05-02 NOTE — Progress Notes (Signed)
[facility] Progress Note Patient Name: LEZETTE KITTS DOB: 1950/11/03 MRN: [id number]   Date of Service  05/02/2020  HPI/Events of Note  Pain - Still has 9/10 pain post medication.   eICU Interventions  Plan: 1. Increase Fentanyl IV infusion to 50-100 mcg IV Q 1 hour PRN pain.      Intervention Category Major Interventions: Other:  Josecarlos Harriott Cornelia Copa 05/02/2020, 4:16 AM

## 2020-05-02 NOTE — Progress Notes (Signed)
Wasted 45mcg of fentanyl in stericycle. Delsa Bern witnessed

## 2020-05-02 NOTE — Progress Notes (Signed)
  Echocardiogram 2D Echocardiogram has been performed.  Angel Lane 05/02/2020, 3:35 PM

## 2020-05-03 DIAGNOSIS — I7101 Dissection of thoracic aorta: Secondary | ICD-10-CM | POA: Diagnosis not present

## 2020-05-03 LAB — TRIGLYCERIDES: Triglycerides: 76 mg/dL (ref <150)

## 2020-05-03 MED ORDER — HYDROCHLOROTHIAZIDE 12.5 MG PO CAPS
12.5000 mg | ORAL_CAPSULE | Freq: Every day | ORAL | Status: DC
  Administered 2020-05-03 – 2020-05-05 (×3): 12.5 mg via ORAL
  Filled 2020-05-03 (×3): qty 1

## 2020-05-03 MED ORDER — ESMOLOL HCL-SODIUM CHLORIDE 2000 MG/100ML IV SOLN
25.0000 ug/kg/min | INTRAVENOUS | Status: DC
  Filled 2020-05-03: qty 100

## 2020-05-03 MED ORDER — CLEVIDIPINE BUTYRATE 0.5 MG/ML IV EMUL
0.0000 mg/h | INTRAVENOUS | Status: DC
  Administered 2020-05-03: 6 mg/h via INTRAVENOUS
  Filled 2020-05-03: qty 50

## 2020-05-03 MED ORDER — CARVEDILOL 12.5 MG PO TABS
12.5000 mg | ORAL_TABLET | Freq: Once | ORAL | Status: AC
  Administered 2020-05-03: 12.5 mg via ORAL
  Filled 2020-05-03: qty 1

## 2020-05-03 MED ORDER — CARVEDILOL 25 MG PO TABS
25.0000 mg | ORAL_TABLET | Freq: Two times a day (BID) | ORAL | Status: DC
  Administered 2020-05-03 – 2020-05-08 (×9): 25 mg via ORAL
  Filled 2020-05-03 (×10): qty 1

## 2020-05-03 NOTE — Progress Notes (Signed)
Pt stated she doesn't want to wear CPAP for the night.  

## 2020-05-03 NOTE — Plan of Care (Signed)

## 2020-05-03 NOTE — Progress Notes (Addendum)
Critical Care Note    05/03/2020 7:03 AM * No surgery found *  Subjective:  Still with anterior chest pain radiating into back. No abd pain, N or V   Vitals:   05/03/20 0645 05/03/20 0700  BP: 111/65 109/66  Pulse: 71 70  Resp: 14 15  Temp:    SpO2: 11% 57%   Systolic WI:203T DH:74B ULA:453MI  Physical Exam: Cardiac:  RRR Lungs:  CTAB Extremities:  2+ right DP and 2+ left PT palpable pulses Abdomen:  Soft, ND, normoactive BS  CBC    Component Value Date/Time   WBC 11.5 (H) 05/01/2020 1610   RBC 4.31 05/01/2020 1610   HGB 12.9 05/01/2020 1610   HGB 13.4 02/19/2020 1604   HCT 40.5 05/01/2020 1610   HCT 40.8 02/19/2020 1604   PLT 179 05/01/2020 1610   PLT 200 02/19/2020 1604   MCV 94.0 05/01/2020 1610   MCV 91 02/19/2020 1604   MCV 90 10/29/2011 1553   MCH 29.9 05/01/2020 1610   MCHC 31.9 05/01/2020 1610   RDW 12.8 05/01/2020 1610   RDW 12.3 02/19/2020 1604   RDW 13.2 10/29/2011 1553   LYMPHSABS 2.5 01/05/2017 2328   MONOABS 0.6 01/05/2017 2328   EOSABS 0.2 01/05/2017 2328   BASOSABS 0.0 01/05/2017 2328    BMET    Component Value Date/Time   NA 136 05/02/2020 0349   NA 141 02/19/2020 1604   NA 141 10/29/2011 1553   K 4.0 05/02/2020 0349   K 3.3 (L) 10/29/2011 1553   CL 104 05/02/2020 0349   CL 108 (H) 10/29/2011 1553   CO2 23 05/02/2020 0349   CO2 26 10/29/2011 1553   GLUCOSE 148 (H) 05/02/2020 0349   GLUCOSE 104 (H) 10/29/2011 1553   BUN 14 05/02/2020 0349   BUN 17 02/19/2020 1604   BUN 18 10/29/2011 1553   CREATININE 0.69 05/02/2020 0349   CREATININE 0.87 10/29/2011 1553   CALCIUM 8.9 05/02/2020 0349   CALCIUM 9.1 10/29/2011 1553   GFRNONAA >60 05/02/2020 0349   GFRNONAA >60 10/29/2011 1553   GFRAA 78 02/19/2020 1604   GFRAA >60 10/29/2011 1553     Intake/Output Summary (Last 24 hours) at 05/03/2020 0703 Last data filed at 05/03/2020 0700 Gross per 24 hour  Intake 514.85 ml  Output 550 ml  Net -35.15 ml    HOSPITAL  MEDICATIONS Scheduled Meds: . amLODipine  10 mg Oral Daily  . carvedilol  12.5 mg Oral BID WC  . Chlorhexidine Gluconate Cloth  6 each Topical Daily  . labetalol  10 mg Intravenous Once  . mouth rinse  15 mL Mouth Rinse BID  . mometasone-formoterol  2 puff Inhalation BID  . nicotine  21 mg Transdermal Daily  . pantoprazole  40 mg Oral Daily  . sodium chloride flush  10-40 mL Intracatheter Q12H  . umeclidinium bromide  1 puff Inhalation Daily   Continuous Infusions: . clevidipine 6 mg/hr (05/03/20 0700)  . esmolol 25 mcg/kg/min (05/03/20 0700)   PRN Meds:.docusate sodium, fentaNYL (SUBLIMAZE) injection, ondansetron (ZOFRAN) IV, oxyCODONE, polyethylene glycol, sodium chloride flush  Systems Assessment: Hemodynamics:BP at goal and remains on BB and CCB infusions and are being titrated down Pulmonary: SaO2 90-96% on 3L via [place] Renal: good UOP; normal serum Cr Vascular: no evidence of malperfusion ID: afebrile Nutrition: NPO for now    Plan: -Continue titration of IV antihypertensive -Repeat CTA ? Today vs tomorrow -Advance diet when appropriate -DVT prophylaxis:  SCDs   SANDRA SETZER, PA-C  [facility] [phone] 05/03/2020  7:03 AM  Still requiring pain meds but less. Agree with above.  Will plan for repeat CTA chest abdomen pelvis tomorrow if renal function remains stable. Ok to have diet from my standpoint [place], MD [facility] [place] Office: [phone]

## 2020-05-03 NOTE — Progress Notes (Signed)
NAME:  Angel Lane, MRN:  [id number], DOB:  Dec 08, 1950, LOS: 2 ADMISSION DATE:  05/01/2020, CONSULTATION DATE:  05/01/2020  REFERRING MD:  Dr Hillard Danker of [facility], CHIEF COMPLAINT:  Type B Aortic Dissection   Brief History:  Type B aortic dissection with hypertension  History of Present Illness:  70 year old female ongoing active smoker with 53 pack smoking history, sleep apnea on CPAP prior history of stroke with mild left-sided weakness in 2018, depression, anemia not otherwise specified.  She is on statin and aspirin at home.  Presented with several hours of upper abdominal and back pain associated with chest pain.  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  Also has aberrant right subclavian artery.  Started on esmolol and Cleviprex with improvement in blood pressure and symptoms.  Vascular service consulted 05/01/2020 and did not discover any evidence of endorgan damage.  Critical care medicine admitting 05/01/2020.  Recommended blood pressure goal of systolic less than 017 and heart rate less than 60  Vascular services also recommending -  Would consider repair if she has progressive enlargement of her aorta or endorgan compromise.  Most likely she would need right carotid subclavian bypass in addition to stent graft that she requires repair due to her aberrant right subclavian artery.  Past Medical History:    has a past medical history of Anemia, Depression, Dyspnea, Hypertension, Neuromuscular disorder ([place]), Sleep apnea, and Stroke ([place]).   reports that she has been smoking cigarettes. She has a 53.00 pack-year smoking history. She has never used smokeless tobacco.  Past Surgical History:  Procedure Laterality Date  . ABDOMINAL SURGERY    . CARDIAC CATHETERIZATION    . CATARACT EXTRACTION W/PHACO Left 05/16/2017   Procedure: CATARACT EXTRACTION  PHACO AND INTRAOCULAR LENS PLACEMENT ([place]) LEFT;  Surgeon: Leandrew Koyanagi, MD;  Location: [facility];  Service: Ophthalmology;  Laterality: Left;  . CATARACT EXTRACTION W/PHACO Right 06/13/2017   Procedure: CATARACT EXTRACTION PHACO AND INTRAOCULAR LENS PLACEMENT ([place]) RIGHT;  Surgeon: Leandrew Koyanagi, MD;  Location: [facility];  Service: Ophthalmology;  Laterality: Right;  sleep apnea  . CHOLECYSTECTOMY    . COLONOSCOPY N/A 01/21/2020   Procedure: COLONOSCOPY;  Surgeon: Toledo, Benay Pike, MD;  Location: [facility];  Service: Gastroenterology;  Laterality: N/A;    Allergies  Allergen Reactions  . Penicillins Other (See Comments)    chills    Immunization History  Administered Date(s) Administered  . PFIZER Comirnaty(Gray Top)Covid-19 Tri-Sucrose Vaccine 04/17/2020  . PFIZER(Purple Top)SARS-COV-2 Vaccination 09/24/2019, 10/15/2019  . Pneumococcal Conjugate-13 03/23/2017  . Pneumococcal Polysaccharide-23 05/29/2019  . Tdap 10/08/2017    Family History  Problem Relation Age of Onset  . Asthma Mother   . Heart failure Mother   . Hypertension Mother   . Heart attack Father   . Breast cancer Maternal Aunt   . Breast cancer Cousin        maternal 1st cousin     Current Facility-Administered Medications:  .  amLODipine (NORVASC) tablet 10 mg, 10 mg, Oral, Daily, Clint Strupp L, DO .  carvedilol (COREG) tablet 12.5 mg, 12.5 mg, Oral, BID WC, Marvin Grabill L, DO, 12.5 mg at 05/03/20 0757 .  Chlorhexidine Gluconate Cloth 2 % PADS 6 each, 6 each, Topical, Daily, Anders Simmonds, MD, 6 each at 05/02/20 0050 .  clevidipine (CLEVIPREX) infusion 0.5 mg/mL, 0-21 mg/hr, Intravenous,  Continuous, Dorie Rank, MD, Last Rate: 12 mL/hr at 05/03/20 0800, 6 mg/hr at 05/03/20 0800 .  docusate sodium (COLACE) capsule 100 mg, 100 mg, Oral, BID PRN, Brand Males, MD .  esmolol (BREVIBLOC) 2000 mg / 100 mL (20 mg/mL) infusion, 25-300 mcg/kg/min, Intravenous,  Continuous, Dorie Rank, MD, Last Rate: 6.58 mL/hr at 05/03/20 0800, 25 mcg/kg/min at 05/03/20 0800 .  fentaNYL (SUBLIMAZE) injection 50-100 mcg, 50-100 mcg, Intravenous, Q1H PRN, Anders Simmonds, MD, 100 mcg at 05/03/20 [phone] .  labetalol (NORMODYNE) injection 10 mg, 10 mg, Intravenous, Once, Dorie Rank, MD .  MEDLINE mouth rinse, 15 mL, Mouth Rinse, BID, Anders Simmonds, MD, 15 mL at 05/02/20 2145 .  mometasone-formoterol (DULERA) 100-5 MCG/ACT inhaler 2 puff, 2 puff, Inhalation, BID, Brand Males, MD, 2 puff at 05/03/20 [phone] .  nicotine (NICODERM CQ - dosed in mg/24 hours) patch 21 mg, 21 mg, Transdermal, Daily, Ramaswamy, Murali, MD, 21 mg at 05/02/20 1016 .  ondansetron (ZOFRAN) injection 4 mg, 4 mg, Intravenous, Q6H PRN, Brand Males, MD, 4 mg at 05/02/20 1914 .  oxyCODONE (Oxy IR/ROXICODONE) immediate release tablet 5-10 mg, 5-10 mg, Oral, Q4H PRN, Cyd Hostler L, DO, 10 mg at 05/02/20 2345 .  pantoprazole (PROTONIX) EC tablet 40 mg, 40 mg, Oral, Daily, Ramaswamy, Murali, MD, 40 mg at 05/02/20 1016 .  polyethylene glycol (MIRALAX / GLYCOLAX) packet 17 g, 17 g, Oral, Daily PRN, Chase Caller, Murali, MD .  sodium chloride flush (NS) 0.9 % injection 10-40 mL, 10-40 mL, Intracatheter, Q12H, Brand Males, MD, 10 mL at 05/02/20 2144 .  sodium chloride flush (NS) 0.9 % injection 10-40 mL, 10-40 mL, Intracatheter, PRN, Chase Caller, Murali, MD .  umeclidinium bromide (INCRUSE ELLIPTA) 62.5 MCG/INH 1 puff, 1 puff, Inhalation, Daily, Brand Males, MD, 1 puff at 05/03/20 [place] Events:  05/01/2020 - admit  Consults:  05/01/2020 -vascular service consult  Procedures:  05/01/2020 -X  Significant Diagnostic Tests:  05/01/2020 -  IMPRESSION: 1. Interval development of type B thoracic aortic dissection that begins distal to the origin of the left subclavian artery and extends through the descending thoracic aorta and into the proximal abdominal aorta. 2.  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 3. Large left hepatic cyst is noted. 4. Emphysema and aortic atherosclerosis.  Micro Data:  X  Antimicrobials:  X  Interim History / Subjective:   Patient seen and evaluated at bedside this morning in the intensive care unit.  Remains on IV continuous esmolol and Cleviprex.  Objective   Blood pressure 107/71, pulse 78, temperature 98.2 F (36.8 C), temperature source Oral, resp. rate (!) 21, height 5\' 5"  (1.651 m), weight 86.6 kg, SpO2 93 %.        Intake/Output Summary (Last 24 hours) at 05/03/2020 0910 Last data filed at 05/03/2020 0800 Gross per 24 hour  Intake 479.59 ml  Output 550 ml  Net -70.41 ml   Filed Weights   05/01/20 1601 05/02/20 0359 05/03/20 0428  Weight: 87.7 kg 87.6 kg 86.6 kg    Examination: General: Elderly female in the intensive care unit on IV blood pressure medications and telemetry monitoring HENT: NCAT, tracking appropriately Lungs: Clear to auscultation bilaterally no crackles no wheeze Cardiovascular: Regular rate rhythm, S1-S2 Abdomen: Soft, nontender nondistended Extremities: No cyanosis no clubbing no edema Neuro: Moves all 4 extremities no focal deficit GU: Deferred  Resolved Hospital Problem list   X  Assessment & Plan:   Type B aortic dissection distal  to the left subclavian artery and ending in the proximal abdominal aorta -present on admission Aberrant right subclavian artery - congenital anomaly -Symptoms improved in the ER with Cleviprex and esmolol P:  Remains with systolic blood pressure goals less than 120 mmHg. Heart rate goal is less than 60 Plans to stop Cleviprex and esmolol today. Increased oral regimen and. Switch to hydrochlorothiazide plus Coreg. We appreciate vascular surgery input.  As she is taking orals and we have able to transition off of the drips today.  We will likely be able to move out of the ICU later this afternoon.  Stroke  2018 with mild residual left lower extremity weakness baseline P:   Observation  Greater than 50 pack smoking history Clinical evidence of emphysema/COPD on CT scan History of sleep apnea on CPAP at home P:   Bronchodilators with Spiriva and Dulera Continued smoking cessation CPAP at home  At risk acute kidney injury with type B aortic dissection P:  Avoid nephrotoxic agents Avoiding ACE inhibitor or ARB  At risk electrolyte imbalance P: Magnesium goal greater than 2 Potassium goal between 4 and 5 Phosphorus goal is normal  At risk for anemia of critical illness P:  Conservative transfusion threshold for hemoglobin less than 7  At risk for thrombocytopenia P Monitor  At risk for hypo and hyperglycemia P:   SSI   Best practice (evaluated daily)  Diet: npo except med Pain/Anxiety/Delirium protocol (if indicated): x VAP protocol (if indicated): hob . 30 DVT prophylaxis: scd and then if strable lovenox after d/w VVS GI prophylaxis: ppo Glucose control: ssi Mobility: bed rest Disposition:from ER to ICU  Goals of Care:   Family Updates:  Spoke with patient's husband at bedside this morning.  LABS    PULMONARY No results for input(s): PHART, PCO2ART, PO2ART, HCO3, TCO2, O2SAT in the last 168 hours.  Invalid input(s): PCO2, PO2  CBC Recent Labs  Lab 05/01/20 1610  HGB 12.9  HCT 40.5  WBC 11.5*  PLT 179    COAGULATION No results for input(s): INR in the last 168 hours.  CARDIAC  No results for input(s): TROPONINI in the last 168 hours. No results for input(s): PROBNP in the last 168 hours.   CHEMISTRY Recent Labs  Lab 05/01/20 1610 05/02/20 0349  NA 137 136  K 3.6 4.0  CL 103 104  CO2 24 23  GLUCOSE 131* 148*  BUN 18 14  CREATININE 0.73 0.69  CALCIUM 9.1 8.9  MG  --  1.9  PHOS  --  3.4   Estimated Creatinine Clearance: 70.1 mL/min (by C-G formula based on SCr of 0.69 mg/dL).   LIVER Recent Labs  Lab 05/01/20 1610  AST 21  ALT  18  ALKPHOS 67  BILITOT 1.1  PROT 6.4*  ALBUMIN 3.4*     INFECTIOUS No results for input(s): LATICACIDVEN, PROCALCITON in the last 168 hours.   ENDOCRINE CBG (last 3)  Recent Labs    05/01/20 1619  GLUCAP 126*    This patient is critically ill with multiple organ system failure; which, requires frequent high complexity decision making, assessment, support, evaluation, and titration of therapies. This was completed through the application of advanced monitoring technologies and extensive interpretation of multiple databases. During this encounter critical care time was devoted to patient care services described in this note for 32 minutes.  Garner Nash, DO [facility] 05/03/2020 9:10 AM

## 2020-05-04 ENCOUNTER — Inpatient Hospital Stay (HOSPITAL_COMMUNITY): Payer: Medicare Other

## 2020-05-04 LAB — BASIC METABOLIC PANEL
Anion gap: 9 (ref 5–15)
BUN: 16 mg/dL (ref 8–23)
CO2: 26 mmol/L (ref 22–32)
Calcium: 8.6 mg/dL — ABNORMAL LOW (ref 8.9–10.3)
Chloride: 97 mmol/L — ABNORMAL LOW (ref 98–111)
Creatinine, Ser: 0.73 mg/dL (ref 0.44–1.00)
GFR, Estimated: >60 mL/min (ref >60)
Glucose, Bld: 108 mg/dL — ABNORMAL HIGH (ref 70–99)
Potassium: 3.4 mmol/L — ABNORMAL LOW (ref 3.5–5.1)
Sodium: 132 mmol/L — ABNORMAL LOW (ref 135–145)

## 2020-05-04 MED ORDER — HYDROMORPHONE HCL 1 MG/ML IJ SOLN
0.5000 mg | INTRAMUSCULAR | Status: DC | PRN
  Administered 2020-05-04 – 2020-05-07 (×11): 0.5 mg via INTRAVENOUS
  Filled 2020-05-04: qty 1
  Filled 2020-05-04: qty 0.5
  Filled 2020-05-04 (×9): qty 1

## 2020-05-04 MED ORDER — ACETAMINOPHEN 325 MG PO TABS
650.0000 mg | ORAL_TABLET | Freq: Four times a day (QID) | ORAL | Status: DC | PRN
  Administered 2020-05-05 – 2020-05-08 (×8): 650 mg via ORAL
  Filled 2020-05-04 (×8): qty 2

## 2020-05-04 MED ORDER — HYDRALAZINE HCL 20 MG/ML IJ SOLN
10.0000 mg | Freq: Four times a day (QID) | INTRAMUSCULAR | Status: DC | PRN
  Administered 2020-05-04 – 2020-05-08 (×7): 10 mg via INTRAVENOUS
  Filled 2020-05-04 (×7): qty 1

## 2020-05-04 MED ORDER — LABETALOL HCL 5 MG/ML IV SOLN
10.0000 mg | INTRAVENOUS | Status: DC | PRN
  Administered 2020-05-04 – 2020-05-06 (×5): 10 mg via INTRAVENOUS
  Filled 2020-05-04 (×7): qty 4

## 2020-05-04 MED ORDER — IOHEXOL 350 MG/ML SOLN
100.0000 mL | Freq: Once | INTRAVENOUS | Status: AC | PRN
  Administered 2020-05-04: 100 mL via INTRAVENOUS

## 2020-05-04 MED ORDER — POTASSIUM CHLORIDE 10 MEQ/100ML IV SOLN
10.0000 meq | INTRAVENOUS | Status: AC
  Administered 2020-05-04 (×4): 10 meq via INTRAVENOUS
  Filled 2020-05-04 (×4): qty 100

## 2020-05-04 NOTE — Progress Notes (Addendum)
      Vitals:   05/04/20 0500 05/04/20 0600  BP: (!) 116/55 114/61  Pulse: 75 76  Resp: 19 19  Temp:    SpO2: 94% 95%   Still complaining of chest pain Moving all 4 ext, palpable pedal pulses B Lungs non labored breathing   Type B dissection Pending repeat CTA chest abdomen pelvis today.  Cr 0.73. Off IV antihypertensives. BP stable at 114/61  Assessment:  Stable post Type B dissection Will repeat CTA in one month Ok for d/c from my standpoint when her pain and BP are controlled  [name], MD [facility] Office: [phone]

## 2020-05-04 NOTE — Progress Notes (Addendum)
PROGRESS NOTE    Angel Lane  BOF:[id number] DOB: 15-Dec-1950 DOA: 05/01/2020 PCP: Virginia Crews, MD   Brief Narrative:  70 year old female ongoing active smoker with 53 pack smoking history, sleep apnea on CPAP prior history of stroke with mild left-sided weakness in 2018, depression, anemia not otherwise specified.  She is on statin and aspirin at home.  Presented with several hours of upper abdominal and back pain associated with chest pain.  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  Also has aberrant right subclavian artery.  Started on esmolol and Cleviprex with improvement in blood pressure and symptoms.  Vascular service consulted 05/01/2020 and did not discover any evidence of endorgan damage.  Critical care medicine admitting 05/01/2020.  Recommended blood pressure goal of systolic less than 778 and heart rate less than 60  Vascular services also recommending - Would consider repair if she has progressive enlargement of her aorta or endorgan compromise. Most likely she would need right carotid subclavian bypass in addition to stent graft that she requires repair due to her aberrant right subclavian artery.  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  Both of these were discontinued on 05/03/2020.  Patient was transferred under [facility] on 05/04/2020.  Patient still complains of intermittent chest pain and back as well as side pain, almost like around her upper abdomen and lower chest.  Has intermittent shortness of breath as well.  Does not believe current pain medications are enough for her.   Requesting to change it.  Will discontinue fentanyl and oxycodone and place her on Dilaudid.  Vascular surgery is following and they have ordered repeat CT angiogram of chest and abdomen.  Appreciate their help.  Blood pressure at goal as recommended by vascular surgery to keep systolic below 242 and heart rate below 60.  Continue oral hydrochlorothiazide and Coreg.  Hypokalemia: Replace.  Recheck in the morning  As she is taking orals and we have able to transition off of the drips today.  We will likely be able to move out of the ICU later this afternoon.  Stroke 2018 with mild residual left lower extremity weakness baseline: Noted  Greater than 50 pack smoking history Clinical evidence of emphysema/COPD on CT scan History of sleep apnea on CPAP at home  Continue bronchodilators with Spiriva and Dulera Continued smoking cessation CPAP at home   DVT prophylaxis: SCDs Start: 05/01/20 2247   Code Status: Full Code  Family Communication: None present at bedside.  Plan of care discussed with patient in length and he verbalized understanding and agreed with it.  Status is: Inpatient  Remains inpatient appropriate because:Ongoing diagnostic testing needed not appropriate for outpatient work up   Dispo: The patient is from: Home              Anticipated d/c is to: Home              Patient currently is not medically stable to d/c.   Difficult to place patient No        Estimated body mass index is 30.93 kg/m as calculated from the following:   Height as  of this encounter: 5\' 5"  (1.651 m).   Weight as of this encounter: 84.3 kg.      Nutritional status:               Consultants:   Vascular surgery  Procedures:   None  Antimicrobials:  Anti-infectives (From admission, onward)   None         Subjective: Seen and examined.  Still complains of abdominal and back pain.  Some shortness of breath.  No other complaint.  Objective: Vitals:   05/04/20  0600 05/04/20 0742 05/04/20 0745 05/04/20 0754  BP: 114/61     Pulse: 76     Resp: 19  19   Temp:    98.4 F (36.9 C)  TempSrc:    Oral  SpO2: 95% 95%    Weight: 84.3 kg     Height:        Intake/Output Summary (Last 24 hours) at 05/04/2020 1046 Last data filed at 05/04/2020 0600 Gross per 24 hour  Intake 221.4 ml  Output 1100 ml  Net -878.6 ml   Filed Weights   05/02/20 0359 05/03/20 0428 05/04/20 0600  Weight: 87.6 kg 86.6 kg 84.3 kg    Examination:  General exam: Appears calm and comfortable  Respiratory system: Clear to auscultation. Respiratory effort normal. Cardiovascular system: S1 & S2 heard, RRR. No JVD, murmurs, rubs, gallops or clicks. No pedal edema. Gastrointestinal system: Abdomen is nondistended, soft and nontender. No organomegaly or masses felt. Normal bowel sounds heard. Central nervous system: Alert and oriented. No focal neurological deficits. Extremities: Symmetric 5 x 5 power. Skin: No rashes, lesions or ulcers Psychiatry: Judgement and insight appear normal. Mood & affect appropriate.    Data Reviewed: I have personally reviewed following labs and imaging studies  CBC: Recent Labs  Lab 05/01/20 1610  WBC 11.5*  HGB 12.9  HCT 40.5  MCV 94.0  PLT 242   Basic Metabolic Panel: Recent Labs  Lab 05/01/20 1610 05/02/20 0349 05/04/20 0035  NA 137 136 132*  K 3.6 4.0 3.4*  CL 103 104 97*  CO2 24 23 26   GLUCOSE 131* 148* 108*  BUN 18 14 16   CREATININE 0.73 0.69 0.73  CALCIUM 9.1 8.9 8.6*  MG  --  1.9  --   PHOS  --  3.4  --    GFR: Estimated Creatinine Clearance: 69.1 mL/min (by C-G formula based on SCr of 0.73 mg/dL). Liver Function Tests: Recent Labs  Lab 05/01/20 1610  AST 21  ALT 18  ALKPHOS 67  BILITOT 1.1  PROT 6.4*  ALBUMIN 3.4*   Recent Labs  Lab 05/01/20 1610  LIPASE 31   No results for input(s): AMMONIA in the last 168 hours. Coagulation Profile: No results for input(s): INR, PROTIME in the last 168  hours. Cardiac Enzymes: No results for input(s): CKTOTAL, CKMB, CKMBINDEX, TROPONINI in the last 168 hours. BNP (last 3 results) No results for input(s): PROBNP in the last 8760 hours. HbA1C: No results for input(s): HGBA1C in the last 72 hours. CBG: Recent Labs  Lab 05/01/20 1619  GLUCAP 126*   Lipid Profile: Recent Labs    05/03/20 0459  TRIG 76   Thyroid Function Tests: No results for input(s): TSH, T4TOTAL, FREET4, T3FREE, THYROIDAB in the last 72 hours. Anemia Panel: No results for input(s): VITAMINB12, FOLATE, FERRITIN, TIBC, IRON, RETICCTPCT in the last 72 hours. Sepsis Labs: No results for input(s): PROCALCITON, LATICACIDVEN in the last 168 hours.  Recent Results (from the  past 240 hour(s))  Resp Panel by RT-PCR (Flu A&B, Covid) Nasopharyngeal Swab     Status: None   Collection Time: 05/01/20  4:03 PM   Specimen: Nasopharyngeal Swab; Nasopharyngeal(NP) swabs in vial transport medium  Result Value Ref Range Status   SARS Coronavirus 2 by RT PCR NEGATIVE NEGATIVE Final    Comment: (NOTE) SARS-CoV-2 target nucleic acids are NOT DETECTED.  The SARS-CoV-2 RNA is generally detectable in upper respiratory specimens during the acute phase of infection. The lowest concentration of SARS-CoV-2 viral copies this assay can detect is 138 copies/mL. A negative result does not preclude SARS-Cov-2 infection and should not be used as the sole basis for treatment or other patient management decisions. A negative result may occur with  improper specimen collection/handling, submission of specimen other than nasopharyngeal swab, presence of viral mutation(s) within the areas targeted by this assay, and inadequate number of viral copies(<138 copies/mL). A negative result must be combined with clinical observations, patient history, and epidemiological information. The expected result is Negative.  Fact Sheet for Patients:  EntrepreneurPulse.com.au  Fact Sheet for  Healthcare Providers:  IncredibleEmployment.be  This test is no t yet approved or cleared by the [place] FDA and  has been authorized for detection and/or diagnosis of SARS-CoV-2 by FDA under an Emergency Use Authorization (EUA). This EUA will remain  in effect (meaning this test can be used) for the duration of the COVID-19 declaration under Section 564(b)(1) of the Act, 21 U.S.C.section 360bbb-3(b)(1), unless the authorization is terminated  or revoked sooner.       Influenza A by PCR NEGATIVE NEGATIVE Final   Influenza B by PCR NEGATIVE NEGATIVE Final    Comment: (NOTE) The Xpert Xpress SARS-CoV-2/FLU/RSV plus assay is intended as an aid in the diagnosis of influenza from Nasopharyngeal swab specimens and should not be used as a sole basis for treatment. Nasal washings and aspirates are unacceptable for Xpert Xpress SARS-CoV-2/FLU/RSV testing.  Fact Sheet for Patients: EntrepreneurPulse.com.au  Fact Sheet for Healthcare Providers: IncredibleEmployment.be  This test is not yet approved or cleared by the [place] FDA and has been authorized for detection and/or diagnosis of SARS-CoV-2 by FDA under an Emergency Use Authorization (EUA). This EUA will remain in effect (meaning this test can be used) for the duration of the COVID-19 declaration under Section 564(b)(1) of the Act, 21 U.S.C. section 360bbb-3(b)(1), unless the authorization is terminated or revoked.  Performed at [facility], [place] [address] [place], [place] [address]   MRSA PCR Screening     Status: None   Collection Time: 05/02/20 12:25 AM   Specimen: Nasopharyngeal  Result Value Ref Range Status   MRSA by PCR NEGATIVE NEGATIVE Final    Comment:        The GeneXpert MRSA Assay (FDA approved for NASAL specimens only), is one component of a comprehensive MRSA colonization surveillance program. It is not intended to diagnose  MRSA infection nor to guide or monitor treatment for MRSA infections. Performed at [facility], [place] [address], [place], [place] [address]       Radiology Studies: ECHOCARDIOGRAM COMPLETE  Result Date: 05/02/2020    ECHOCARDIOGRAM REPORT   Patient Name:   SHANNIA JACUINDE Date of Exam: 05/02/2020 Medical Rec #:  [id number]        Height:       65.0 in Accession #:    [id number]       Weight:       193.1 lb  Date of Birth:  June 25, 1950        BSA:          1.949 m Patient Age:    21 years         BP:           110/58 mmHg Patient Gender: F                HR:           79 bpm. Exam Location:  Inpatient Procedure: 2D Echo Indications:    aortic dissection  History:        Patient has prior history of Echocardiogram examinations, most                 recent 01/06/2017. Risk Factors:Sleep Apnea, Current Smoker and                 Hypertension.  Sonographer:    Johny Chess Referring Phys: [place]  1. Left ventricular ejection fraction, by estimation, is 60 to 65%. The left ventricle has normal function. The left ventricle has no regional wall motion abnormalities. Left ventricular diastolic parameters are consistent with Grade I diastolic dysfunction (impaired relaxation). Elevated left atrial pressure.  2. Right ventricular systolic function is normal. The right ventricular size is normal.  3. The mitral valve is normal in structure. No evidence of mitral valve regurgitation. No evidence of mitral stenosis.  4. The aortic valve is tricuspid. Aortic valve regurgitation is moderate.  5. Descending aorta not well visualized.  6. The inferior vena cava is normal in size with greater than 50% respiratory variability, suggesting right atrial pressure of 3 mmHg. FINDINGS  Left Ventricle: Left ventricular ejection fraction, by estimation, is 60 to 65%. The left ventricle has normal function. The left ventricle has no regional wall motion abnormalities. The left ventricular internal  cavity size was normal in size. There is  no left ventricular hypertrophy. Left ventricular diastolic parameters are consistent with Grade I diastolic dysfunction (impaired relaxation). Elevated left atrial pressure. Right Ventricle: The right ventricular size is normal. No increase in right ventricular wall thickness. Right ventricular systolic function is normal. Left Atrium: Left atrial size was normal in size. Right Atrium: Right atrial size was normal in size. Pericardium: There is no evidence of pericardial effusion. Mitral Valve: The mitral valve is normal in structure. No evidence of mitral valve regurgitation. No evidence of mitral valve stenosis. Tricuspid Valve: The tricuspid valve is normal in structure. Tricuspid valve regurgitation is not demonstrated. No evidence of tricuspid stenosis. Aortic Valve: The aortic valve is tricuspid. Aortic valve regurgitation is moderate. Aortic regurgitation PHT measures 382 msec. Aortic valve mean gradient measures 4.8 mmHg. Aortic valve peak gradient measures 7.2 mmHg. Pulmonic Valve: The pulmonic valve was not well visualized. Pulmonic valve regurgitation is not visualized. No evidence of pulmonic stenosis. Aorta: Descending aorta not well visualized. The aortic root is normal in size and structure. Pulmonary Artery: Indeterminant PASP, inadequate TR jet. Venous: The inferior vena cava is normal in size with greater than 50% respiratory variability, suggesting right atrial pressure of 3 mmHg. IAS/Shunts: No atrial level shunt detected by color flow Doppler.  LEFT VENTRICLE PLAX 2D LVIDd:         4.50 cm  Diastology LVIDs:         2.80 cm  LV e' medial:    6.31 cm/s LV PW:         0.90 cm  LV E/e' medial:  17.4 LV IVS:        1.00 cm  LV e' lateral:   7.72 cm/s LVOT diam:     2.00 cm  LV E/e' lateral: 14.2 LVOT Area:     3.14 cm  RIGHT VENTRICLE             IVC RV S prime:     14.00 cm/s  IVC diam: 1.60 cm TAPSE (M-mode): 2.1 cm LEFT ATRIUM             Index        RIGHT ATRIUM           Index LA diam:        3.60 cm 1.85 cm/m  RA Area:     12.40 cm LA Vol (A2C):   55.1 ml 28.27 ml/m RA Volume:   27.10 ml  13.91 ml/m LA Vol (A4C):   40.5 ml 20.78 ml/m LA Biplane Vol: 48.6 ml 24.94 ml/m  AORTIC VALVE AV Vmax:      134.38 cm/s AV Vmean:     107.879 cm/s AV VTI:       0.319 m AV Peak Grad: 7.2 mmHg AV Mean Grad: 4.8 mmHg AI PHT:       382 msec  AORTA Ao Root diam: 3.20 cm Ao Asc diam:  3.70 cm MITRAL VALVE MV Area (PHT): 4.89 cm     SHUNTS MV Decel Time: 155 msec     Systemic Diam: 2.00 cm MV E velocity: 110.00 cm/s MV A velocity: 128.00 cm/s MV E/A ratio:  0.86 Carlyle Dolly MD Electronically signed by Carlyle Dolly MD Signature Date/Time: 05/02/2020/4:10:25 PM    Final    CT Angio Chest/Abd/Pel for Dissection W and/or W/WO  Result Date: 05/04/2020 CLINICAL DATA:  Follow-up thoracic aortic dissection EXAM: CT ANGIOGRAPHY CHEST, ABDOMEN AND PELVIS TECHNIQUE: Non-contrast CT of the chest was initially obtained. Multidetector CT imaging through the chest, abdomen and pelvis was performed using the standard protocol during bolus administration of intravenous contrast. Multiplanar reconstructed images and MIPs were obtained and reviewed to evaluate the vascular anatomy. CONTRAST:  159mL OMNIPAQUE IOHEXOL 350 MG/ML SOLN COMPARISON:  05/01/2020, 11/27/2017 FINDINGS: CTA CHEST FINDINGS Cardiovascular: No significant cardiac enlargement is noted. The ascending aorta is within normal limits without evidence of aneurysmal dilatation or dissection. Variant anatomy is noted with an aberrant right subclavian artery identified arising just distal to the left subclavian artery. There is again noted a type B thoracic aortic dissection which appears to arise just beyond the origin of the left subclavian artery. This extends distally to the proximal abdominal aorta just above the origins of the renal arteries. The overall appearance is stable. The pulmonary artery as visualized is  well opacified and shows no evidence of pulmonary emboli. Mediastinum/Nodes: Thoracic inlet is within normal limits. No sizable hilar or mediastinal adenopathy is noted. The esophagus as visualized is within normal limits. Lungs/Pleura: Diffuse emphysematous changes are again identified and stable. Better visualized on today's study is a 7 mm nodule in the right upper lobe best seen on image number 28 of series 8. This is stable from a prior exam from 2019 and consistent with a benign etiology. Previously seen subpleural nodule in the lateral aspect of the left upper lobe is again noted but slightly less prominent than that seen on the prior exam. Scattered consolidation in the lower lobes is noted with associated small effusions. The effusions are new from the prior exam. Musculoskeletal: No chest wall abnormality. No acute  or significant osseous findings. Review of the MIP images confirms the above findings. CTA ABDOMEN AND PELVIS FINDINGS VASCULAR Aorta: Previously mentioned aortic dissection extends into the proximal abdominal aorta just above the level of the renal arteries stable from the prior exam. Or distal atherosclerotic calcifications are noted without aneurysmal dilatation. Celiac: Patent without evidence of aneurysm, dissection, vasculitis or significant stenosis. Arises from the true lumen. SMA: Patent without evidence of aneurysm, dissection, vasculitis or significant stenosis. Arises from the true lumen. Renals: Mild atherosclerotic changes are noted. Single renal arteries are seen bilaterally and widely patent. IMA: Patent without evidence of aneurysm, dissection, vasculitis or significant stenosis. Iliacs: Mild atherosclerotic changes are noted without aneurysmal dilatation or dissection. Veins: No specific venous abnormality is noted. Review of the MIP images confirms the above findings. NON-VASCULAR Hepatobiliary: Large hepatic cyst is again identified in the left lobe of the liver stable from  the prior exam. This measures approximately 8.2 cm in greatest dimension. Gallbladder has been surgically removed. Pancreas: Unremarkable. No pancreatic ductal dilatation or surrounding inflammatory changes. Spleen: Normal in size without focal abnormality. Adrenals/Urinary Tract: Adrenal glands are within normal limits. Kidneys demonstrate a normal enhancement pattern bilaterally. No renal calculi or obstructive changes are noted. Bladder is partially distended. Stomach/Bowel: No obstructive or inflammatory changes of the colon are seen. The appendix is within normal limits. Small bowel and stomach are unremarkable. Lymphatic: No significant lymphadenopathy is noted. Reproductive: Uterus and bilateral adnexa are unremarkable. Other: No abdominal wall hernia or abnormality. No abdominopelvic ascites. Musculoskeletal: Degenerative changes of lumbar spine are noted. Review of the MIP images confirms the above findings. IMPRESSION: Type B thoracic aortic dissection as described stable in appearance from the prior exam. Increasing consolidation and effusions in the bases bilaterally. Aberrant right subclavian artery. Parenchymal nodules in the upper lobe stable from 2019 consistent with a benign etiology. Chronic changes as described above. Aortic Atherosclerosis (ICD10-I70.0) and Emphysema (ICD10-J43.9). Electronically Signed   By: Inez Catalina M.D.   On: 05/04/2020 09:10    Scheduled Meds: . carvedilol  25 mg Oral BID WC  . Chlorhexidine Gluconate Cloth  6 each Topical Daily  . hydrochlorothiazide  12.5 mg Oral Daily  . mouth rinse  15 mL Mouth Rinse BID  . mometasone-formoterol  2 puff Inhalation BID  . nicotine  21 mg Transdermal Daily  . pantoprazole  40 mg Oral Daily  . sodium chloride flush  10-40 mL Intracatheter Q12H  . umeclidinium bromide  1 puff Inhalation Daily   Continuous Infusions:   LOS: 3 days   Time spent: 36 minutes   Darliss Cheney, MD [facility]  05/04/2020, 10:46 AM    To contact the attending provider between 7A-7P or the covering provider during after hours 7P-7A, please log into the web site [email or web address].

## 2020-05-04 NOTE — Progress Notes (Signed)
Patient arrived from [facility] into [id number]. Skin assessment and CHG completed. Vital signs taken and charted. Cardiac monitor applied and central monitoring notified. Will continue to monitor.   -Donnelly Angelica, RN

## 2020-05-04 NOTE — Progress Notes (Signed)
@  2113 Dr. Tonie Griffith, on-call for [facility], text-paged regarding pt's SBPs (120s-130s with goal <120) and not time for PRNs.   Page promptly return. Verbal order received to give PRN hydralazine early. Will administer and continue to assess BP.

## 2020-05-04 NOTE — Progress Notes (Signed)
Was reported to this nurse that the HR was to be below 60 and BP below 802 systolic. Upon arrival to 4E the BP was 133/95 and HR 78. Physician paged, awaiting responses.  Evon Slack, RN

## 2020-05-05 LAB — CBC WITH DIFFERENTIAL/PLATELET
Abs Immature Granulocytes: 0.04 10*3/uL (ref 0.00–0.07)
Basophils Absolute: 0 10*3/uL (ref 0.0–0.1)
Basophils Relative: 0 %
Eosinophils Absolute: 0 10*3/uL (ref 0.0–0.5)
Eosinophils Relative: 0 %
HCT: 36.5 % (ref 36.0–46.0)
Hemoglobin: 12.2 g/dL (ref 12.0–15.0)
Immature Granulocytes: 0 %
Lymphocytes Relative: 14 %
Lymphs Abs: 1.2 10*3/uL (ref 0.7–4.0)
MCH: 30.3 pg (ref 26.0–34.0)
MCHC: 33.4 g/dL (ref 30.0–36.0)
MCV: 90.8 fL (ref 80.0–100.0)
Monocytes Absolute: 1.1 10*3/uL — ABNORMAL HIGH (ref 0.1–1.0)
Monocytes Relative: 12 %
Neutro Abs: 6.6 10*3/uL (ref 1.7–7.7)
Neutrophils Relative %: 74 %
Platelets: 197 10*3/uL (ref 150–400)
RBC: 4.02 MIL/uL (ref 3.87–5.11)
RDW: 12.6 % (ref 11.5–15.5)
WBC: 9 10*3/uL (ref 4.0–10.5)
nRBC: 0 % (ref 0.0–0.2)

## 2020-05-05 LAB — BASIC METABOLIC PANEL
Anion gap: 14 (ref 5–15)
BUN: 12 mg/dL (ref 8–23)
CO2: 24 mmol/L (ref 22–32)
Calcium: 8.8 mg/dL — ABNORMAL LOW (ref 8.9–10.3)
Chloride: 93 mmol/L — ABNORMAL LOW (ref 98–111)
Creatinine, Ser: 0.7 mg/dL (ref 0.44–1.00)
GFR, Estimated: >60 mL/min (ref >60)
Glucose, Bld: 93 mg/dL (ref 70–99)
Potassium: 3.2 mmol/L — ABNORMAL LOW (ref 3.5–5.1)
Sodium: 131 mmol/L — ABNORMAL LOW (ref 135–145)

## 2020-05-05 LAB — PROCALCITONIN: Procalcitonin: <0.1 ng/mL

## 2020-05-05 MED ORDER — POTASSIUM CHLORIDE 10 MEQ/100ML IV SOLN
10.0000 meq | INTRAVENOUS | Status: AC
  Administered 2020-05-05 (×5): 10 meq via INTRAVENOUS
  Filled 2020-05-05 (×5): qty 100

## 2020-05-05 MED ORDER — POTASSIUM CHLORIDE CRYS ER 20 MEQ PO TBCR
40.0000 meq | EXTENDED_RELEASE_TABLET | ORAL | Status: DC
  Filled 2020-05-05: qty 2

## 2020-05-05 MED ORDER — HYDROCHLOROTHIAZIDE 25 MG PO TABS
25.0000 mg | ORAL_TABLET | Freq: Every day | ORAL | Status: DC
  Administered 2020-05-06 – 2020-05-08 (×3): 25 mg via ORAL
  Filled 2020-05-05 (×3): qty 1

## 2020-05-05 NOTE — Progress Notes (Signed)
PROGRESS NOTE    Angel Lane  JOA:[id number] DOB: May 05, 1950 DOA: 05/01/2020 PCP: Virginia Crews, MD   Brief Narrative:  70 year old female ongoing active smoker with 53 pack smoking history, sleep apnea on CPAP prior history of stroke with mild left-sided weakness in 2018, depression, anemia not otherwise specified.  She is on statin and aspirin at home.  Presented with several hours of upper abdominal and back pain associated with chest pain.  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  Also has aberrant right subclavian artery.  Started on esmolol and Cleviprex with improvement in blood pressure and symptoms.  Vascular service consulted 05/01/2020 and did not discover any evidence of endorgan damage.  Critical care medicine admitting 05/01/2020.  Recommended blood pressure goal of systolic less than 601 and heart rate less than 60  Vascular services also recommending - Would consider repair if she has progressive enlargement of her aorta or endorgan compromise. Most likely she would need right carotid subclavian bypass in addition to stent graft that she requires repair due to her aberrant right subclavian artery.  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  Both of these were discontinued on 05/03/2020.  Patient was transferred under [place] on 05/04/2020.  Repeat CT angiogram of chest and abdomen was done on 05/04/2020 which shows a stable aortic dissection type B.  Patient still complains of chest and back pain.  Vascular surgery has signed off and have recommended follow-up as outpatient in a week  with repeat CT angiogram in a month.  Blood pressure fairly stable.  Personally discussed with Dr. Oneida Alar of vascular surgery and according to him systolic blood pressure up to 130 is acceptable however he prefers to keep it less than 120 as much as we can.  Currently it is fairly controlled.  Continue current dose of Coreg but increase hydrochlorothiazide to 25 mg and continue as needed hydralazine and labetalol.  Hypokalemia: Low again.  Will replace.  Stroke 2018 with mild residual left lower extremity weakness baseline: Noted  Greater than 50 pack smoking history Clinical evidence of emphysema/COPD on CT scan History of sleep apnea on CPAP at home  Continue bronchodilators with Spiriva and Dulera Continued smoking cessation CPAP at home  Acute hypoxic respiratory failure/bibasilar consolidations and effusions: Procalcitonin unremarkable.  Patient does not have any symptoms consistent with pneumonia.  Checking CBC for white blood cells.  So far no evidence of pneumonia.  Will monitor closely.  Encouraged incentive spirometry.  Her hypoxia is likely secondary to atelectasis as she is hurting when she is taking deep breaths.  DVT prophylaxis: SCDs Start: 05/01/20 2247   Code Status: Full Code  Family Communication: None present at bedside.  Plan of care discussed with patient in length and he verbalized understanding and agreed with it.  Status is: Inpatient  Remains inpatient appropriate because:Ongoing diagnostic testing needed not appropriate for outpatient work up   Dispo: The patient is from: Home              Anticipated d/c is to: Home              Patient  currently is not medically stable to d/c.   Difficult to place patient No        Estimated body mass index is 33.16 kg/m as calculated from the following:   Height as of this encounter: 5\' 5"  (1.651 m).   Weight as of this encounter: 90.4 kg.      Nutritional status:               Consultants:    Vascular surgery  Procedures:   None  Antimicrobials:  Anti-infectives (From admission, onward)   None         Subjective: Seen and examined.  Still complains of chest pain and back pain but better than yesterday.  No shortness of breath.  Objective: Vitals:   05/05/20 0923 05/05/20 1000 05/05/20 1110 05/05/20 1200  BP: 125/88 119/62 126/69 129/70  Pulse:   79 78  Resp:   19 16  Temp:      TempSrc:      SpO2:    94%  Weight:      Height:        Intake/Output Summary (Last 24 hours) at 05/05/2020 1414 Last data filed at 05/05/2020 0400 Gross per 24 hour  Intake 720 ml  Output --  Net 720 ml   Filed Weights   05/03/20 0428 05/04/20 0600 05/05/20 0330  Weight: 86.6 kg 84.3 kg 90.4 kg    Examination:  General exam: Appears calm and comfortable  Respiratory system: Diminished breath sounds at the bases, no rhonchi or crackles. Respiratory effort normal. Cardiovascular system: S1 & S2 heard, RRR. No JVD, murmurs, rubs, gallops or clicks. No pedal edema. Gastrointestinal system: Abdomen is nondistended, soft and nontender. No organomegaly or masses felt. Normal bowel sounds heard. Central nervous system: Alert and oriented. No focal neurological deficits. Extremities: Symmetric 5 x 5 power. Skin: No rashes, lesions or ulcers.  Psychiatry: Judgement and insight appear normal. Mood & affect appropriate.   Data Reviewed: I have personally reviewed following labs and imaging studies  CBC: Recent Labs  Lab 05/01/20 1610 05/05/20 1000  WBC 11.5* 9.0  NEUTROABS  --  6.6  HGB 12.9 12.2  HCT 40.5 36.5  MCV 94.0 90.8  PLT 179 161   Basic Metabolic Panel: Recent Labs  Lab 05/01/20 1610 05/02/20 0349 05/04/20 0035 05/05/20 0146  NA 137 136 132* 131*  K 3.6 4.0 3.4* 3.2*  CL 103 104 97* 93*  CO2 24 23 26 24   GLUCOSE 131* 148* 108* 93  BUN 18 14 16 12   CREATININE 0.73 0.69 0.73 0.70  CALCIUM 9.1 8.9 8.6* 8.8*  MG  --  1.9  --   --   PHOS  --  3.4  --   --     GFR: Estimated Creatinine Clearance: 71.7 mL/min (by C-G formula based on SCr of 0.7 mg/dL). Liver Function Tests: Recent Labs  Lab 05/01/20 1610  AST 21  ALT 18  ALKPHOS 67  BILITOT 1.1  PROT 6.4*  ALBUMIN 3.4*   Recent Labs  Lab 05/01/20 1610  LIPASE 31   No results for input(s): AMMONIA in the last 168 hours. Coagulation Profile: No results for input(s): INR, PROTIME in the last 168 hours. Cardiac Enzymes: No results for input(s): CKTOTAL, CKMB, CKMBINDEX, TROPONINI in the last 168 hours. BNP (last 3 results) No results for input(s): PROBNP in the last 8760 hours. HbA1C: No results for input(s): HGBA1C in the last 72 hours. CBG: Recent Labs  Lab 05/01/20 1619  GLUCAP  126*   Lipid Profile: Recent Labs    05/03/20 0459  TRIG 76   Thyroid Function Tests: No results for input(s): TSH, T4TOTAL, FREET4, T3FREE, THYROIDAB in the last 72 hours. Anemia Panel: No results for input(s): VITAMINB12, FOLATE, FERRITIN, TIBC, IRON, RETICCTPCT in the last 72 hours. Sepsis Labs: Recent Labs  Lab 05/05/20 0854  PROCALCITON <0.10    Recent Results (from the past 240 hour(s))  Resp Panel by RT-PCR (Flu A&B, Covid) Nasopharyngeal Swab     Status: None   Collection Time: 05/01/20  4:03 PM   Specimen: Nasopharyngeal Swab; Nasopharyngeal(NP) swabs in vial transport medium  Result Value Ref Range Status   SARS Coronavirus 2 by RT PCR NEGATIVE NEGATIVE Final    Comment: (NOTE) SARS-CoV-2 target nucleic acids are NOT DETECTED.  The SARS-CoV-2 RNA is generally detectable in upper respiratory specimens during the acute phase of infection. The lowest concentration of SARS-CoV-2 viral copies this assay can detect is 138 copies/mL. A negative result does not preclude SARS-Cov-2 infection and should not be used as the sole basis for treatment or other patient management decisions. A negative result may occur with  improper specimen collection/handling, submission of specimen  other than nasopharyngeal swab, presence of viral mutation(s) within the areas targeted by this assay, and inadequate number of viral copies(<138 copies/mL). A negative result must be combined with clinical observations, patient history, and epidemiological information. The expected result is Negative.  Fact Sheet for Patients:  EntrepreneurPulse.com.au  Fact Sheet for Healthcare Providers:  IncredibleEmployment.be  This test is no t yet approved or cleared by the [place] FDA and  has been authorized for detection and/or diagnosis of SARS-CoV-2 by FDA under an Emergency Use Authorization (EUA). This EUA will remain  in effect (meaning this test can be used) for the duration of the COVID-19 declaration under Section 564(b)(1) of the Act, 21 U.S.C.section 360bbb-3(b)(1), unless the authorization is terminated  or revoked sooner.       Influenza A by PCR NEGATIVE NEGATIVE Final   Influenza B by PCR NEGATIVE NEGATIVE Final    Comment: (NOTE) The Xpert Xpress SARS-CoV-2/FLU/RSV plus assay is intended as an aid in the diagnosis of influenza from Nasopharyngeal swab specimens and should not be used as a sole basis for treatment. Nasal washings and aspirates are unacceptable for Xpert Xpress SARS-CoV-2/FLU/RSV testing.  Fact Sheet for Patients: EntrepreneurPulse.com.au  Fact Sheet for Healthcare Providers: IncredibleEmployment.be  This test is not yet approved or cleared by the [place] FDA and has been authorized for detection and/or diagnosis of SARS-CoV-2 by FDA under an Emergency Use Authorization (EUA). This EUA will remain in effect (meaning this test can be used) for the duration of the COVID-19 declaration under Section 564(b)(1) of the Act, 21 U.S.C. section 360bbb-3(b)(1), unless the authorization is terminated or revoked.  Performed at [facility], [place] [address], [place],  [place] [address]   MRSA PCR Screening     Status: None   Collection Time: 05/02/20 12:25 AM   Specimen: Nasopharyngeal  Result Value Ref Range Status   MRSA by PCR NEGATIVE NEGATIVE Final    Comment:        The GeneXpert MRSA Assay (FDA approved for NASAL specimens only), is one component of a comprehensive MRSA colonization surveillance program. It is not intended to diagnose MRSA infection nor to guide or monitor treatment for MRSA infections. Performed at [facility], [place] [address] [place], [place] [address]  Radiology Studies: CT Angio Chest/Abd/Pel for Dissection W and/or W/WO  Result Date: 05/04/2020 CLINICAL DATA:  Follow-up thoracic aortic dissection EXAM: CT ANGIOGRAPHY CHEST, ABDOMEN AND PELVIS TECHNIQUE: Non-contrast CT of the chest was initially obtained. Multidetector CT imaging through the chest, abdomen and pelvis was performed using the standard protocol during bolus administration of intravenous contrast. Multiplanar reconstructed images and MIPs were obtained and reviewed to evaluate the vascular anatomy. CONTRAST:  155mL OMNIPAQUE IOHEXOL 350 MG/ML SOLN COMPARISON:  05/01/2020, 11/27/2017 FINDINGS: CTA CHEST FINDINGS Cardiovascular: No significant cardiac enlargement is noted. The ascending aorta is within normal limits without evidence of aneurysmal dilatation or dissection. Variant anatomy is noted with an aberrant right subclavian artery identified arising just distal to the left subclavian artery. There is again noted a type B thoracic aortic dissection which appears to arise just beyond the origin of the left subclavian artery. This extends distally to the proximal abdominal aorta just above the origins of the renal arteries. The overall appearance is stable. The pulmonary artery as visualized is well opacified and shows no evidence of pulmonary emboli. Mediastinum/Nodes: Thoracic inlet is within normal limits. No sizable hilar or mediastinal adenopathy is  noted. The esophagus as visualized is within normal limits. Lungs/Pleura: Diffuse emphysematous changes are again identified and stable. Better visualized on today's study is a 7 mm nodule in the right upper lobe best seen on image number 28 of series 8. This is stable from a prior exam from 2019 and consistent with a benign etiology. Previously seen subpleural nodule in the lateral aspect of the left upper lobe is again noted but slightly less prominent than that seen on the prior exam. Scattered consolidation in the lower lobes is noted with associated small effusions. The effusions are new from the prior exam. Musculoskeletal: No chest wall abnormality. No acute or significant osseous findings. Review of the MIP images confirms the above findings. CTA ABDOMEN AND PELVIS FINDINGS VASCULAR Aorta: Previously mentioned aortic dissection extends into the proximal abdominal aorta just above the level of the renal arteries stable from the prior exam. Or distal atherosclerotic calcifications are noted without aneurysmal dilatation. Celiac: Patent without evidence of aneurysm, dissection, vasculitis or significant stenosis. Arises from the true lumen. SMA: Patent without evidence of aneurysm, dissection, vasculitis or significant stenosis. Arises from the true lumen. Renals: Mild atherosclerotic changes are noted. Single renal arteries are seen bilaterally and widely patent. IMA: Patent without evidence of aneurysm, dissection, vasculitis or significant stenosis. Iliacs: Mild atherosclerotic changes are noted without aneurysmal dilatation or dissection. Veins: No specific venous abnormality is noted. Review of the MIP images confirms the above findings. NON-VASCULAR Hepatobiliary: Large hepatic cyst is again identified in the left lobe of the liver stable from the prior exam. This measures approximately 8.2 cm in greatest dimension. Gallbladder has been surgically removed. Pancreas: Unremarkable. No pancreatic ductal  dilatation or surrounding inflammatory changes. Spleen: Normal in size without focal abnormality. Adrenals/Urinary Tract: Adrenal glands are within normal limits. Kidneys demonstrate a normal enhancement pattern bilaterally. No renal calculi or obstructive changes are noted. Bladder is partially distended. Stomach/Bowel: No obstructive or inflammatory changes of the colon are seen. The appendix is within normal limits. Small bowel and stomach are unremarkable. Lymphatic: No significant lymphadenopathy is noted. Reproductive: Uterus and bilateral adnexa are unremarkable. Other: No abdominal wall hernia or abnormality. No abdominopelvic ascites. Musculoskeletal: Degenerative changes of lumbar spine are noted. Review of the MIP images confirms the above findings. IMPRESSION: Type B thoracic aortic dissection as described stable  in appearance from the prior exam. Increasing consolidation and effusions in the bases bilaterally. Aberrant right subclavian artery. Parenchymal nodules in the upper lobe stable from 2019 consistent with a benign etiology. Chronic changes as described above. Aortic Atherosclerosis (ICD10-I70.0) and Emphysema (ICD10-J43.9). Electronically Signed   By: Inez Catalina M.D.   On: 05/04/2020 09:10    Scheduled Meds: . carvedilol  25 mg Oral BID WC  . Chlorhexidine Gluconate Cloth  6 each Topical Daily  . hydrochlorothiazide  12.5 mg Oral Daily  . mouth rinse  15 mL Mouth Rinse BID  . mometasone-formoterol  2 puff Inhalation BID  . nicotine  21 mg Transdermal Daily  . pantoprazole  40 mg Oral Daily  . sodium chloride flush  10-40 mL Intracatheter Q12H  . umeclidinium bromide  1 puff Inhalation Daily   Continuous Infusions: . potassium chloride 10 mEq (05/05/20 1303)     LOS: 4 days   Time spent: 30 minutes   Darliss Cheney, MD [facility]  05/05/2020, 2:14 PM   To contact the attending provider between 7A-7P or the covering provider during after hours 7P-7A, please log  into the web site [email or web address].

## 2020-05-05 NOTE — Progress Notes (Signed)
PT Cancellation Note  Patient Details Name: Angel Lane MRN: [id number] DOB: [birth year]   Cancelled Treatment:    Reason Eval/Treat Not Completed: Medical issues which prohibited therapy Spoke with RN, and pt's SBP is >120 and HR >70 which is above recommended ranges. RN requesting to hold. Will follow up as pt medically appropriate.   Lou Miner, DPT  Acute Rehabilitation Services  Pager: [phone] Office: [phone]    Rudean Hitt 05/05/2020, 9:14 AM

## 2020-05-05 NOTE — Progress Notes (Signed)
Occupational Therapy Evaluation Patient Details Name: Angel Lane MRN: [id number] DOB: [birth year] Today's Date: 05/05/2020    History of Present Illness 70 year old female ongoing active smoker with 53 pack smoking history, sleep apnea on CPAP prior history of stroke with mild left-sided weakness in 2018, depression, anemia not otherwise specified.  She is on statin and aspirin at home.  Presented with several hours of upper abdominal and back pain associated with chest pain.  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  Also has aberrant right subclavian artery.  Started on esmolol and Cleviprex with improvement in blood pressure and symptoms.  Vascular service consulted 05/01/2020 and did not discover any evidence of endorgan damage.  Critical care medicine admitting 05/01/2020.   Clinical Impression   PTA pt lives independently at home with her husband. Initial BP high taken on L forearm. Switched to regular BP cuff  and BP taken on R upper arm. BP 124/60; HR @ 80 in bed. Able to progress to EOB with BP 89/60; HR 81 without complaints of dizziness. Once in recliner BP 124/61; after sitting 5 min 106/68. SpO2 88 RA; 95 on 2L. Nsg made aware. Encouraged pt to complete her incentive spirometer. Pt reports feeling better once OOB in chair. Will follow acutely to facilitate safe DC home. Do not anticipate need for OT follow up after DC.     Follow Up Recommendations  No OT follow up;Supervision - Intermittent    Equipment Recommendations  None recommended by OT    Recommendations for Other Services       Precautions / Restrictions Precautions Precaution Comments: BP lower than 120; HR @70       Mobility Bed Mobility Overal bed mobility: Needs Assistance Bed Mobility: Supine to Sit     Supine to sit: Min guard          Transfers Overall transfer  level: Needs assistance Equipment used: 1 person hand held assist Transfers: Sit to/from [employer] Transfers Sit to Stand: Min guard;+2 safety/equipment (steady assist) Stand pivot transfers: Min guard;+2 safety/equipment            Balance Overall balance assessment: Needs assistance   Sitting balance-Leahy Scale: Good       Standing balance-Leahy Scale: Fair                             ADL either performed or assessed with clinical judgement   ADL Overall ADL's : Needs assistance/impaired Eating/Feeding: Independent   Grooming: Sitting   Upper Body Bathing: Set up;Sitting   Lower Body Bathing: Min guard;Sit to/from stand   Upper Body Dressing : Set up;Sitting   Lower Body Dressing: Sit to/from stand   Toilet Transfer: Min guard   [place] and Hygiene: Min guard       Functional mobility during ADLs: Min guard;+2 for safety/equipment General ADL Comments: Pt has been in bed for 3 days and minimally unsteady     Vision   Additional Comments: c/o floaters     Perception     Praxis      Pertinent Vitals/Pain Pain Assessment: 0-10 Pain Score: 8  Pain Location: between shoulder blades Pain Descriptors / Indicators: Grimacing;Guarding;Discomfort Pain Intervention(s): Limited activity within patient's tolerance     Hand Dominance Right   Extremity/Trunk Assessment Upper Extremity Assessment Upper Extremity Assessment: Overall  WFL for tasks assessed   Lower Extremity Assessment Lower Extremity Assessment: LLE deficits/detail LLE Deficits / Details: c/o L hip bursitis   Cervical / Trunk Assessment Cervical / Trunk Assessment: Normal   Communication Communication Communication: HOH   Cognition Arousal/Alertness: Awake/alert Behavior During Therapy: WFL for tasks assessed/performed Overall Cognitive Status: Within Functional Limits for tasks assessed                                      General Comments       Exercises     Shoulder Instructions      Home Living Family/patient expects to be discharged to:: Private residence Living Arrangements: Spouse/significant other Available Help at Discharge: Available 24 hours/day Type of Home: House Home Access: Stairs to enter [employer] of Steps: 2 Entrance Stairs-Rails: Right;Left Home Layout: One level     Bathroom Shower/Tub: Walk-in shower;Tub/shower unit   Bathroom Toilet: Handicapped height Bathroom Accessibility: Yes How Accessible: Accessible via walker Home Equipment: Bedside commode;Shower seat;Walker - 2 wheels;Hand held shower head          Prior Functioning/Environment Level of Independence: Independent        Comments: L side a little weak from CVA; drives; independently manages medication        OT Problem List: Decreased strength;Decreased activity tolerance;Decreased knowledge of use of DME or AE;Cardiopulmonary status limiting activity;Obesity;Pain      OT Treatment/Interventions: Self-care/ADL training;Energy conservation;DME and/or AE instruction;Therapeutic activities;Patient/family education    OT Goals(Current goals can be found in the care plan section) Acute Rehab OT Goals Patient Stated Goal: to go home OT Goal Formulation: With patient Time For Goal Achievement: 05/19/20 Potential to Achieve Goals: Good  OT Frequency: Min 2X/week   Barriers to D/C:            Co-evaluation PT/OT/SLP Co-Evaluation/Treatment: Yes Reason for Co-Treatment: For patient/therapist safety   OT goals addressed during session: ADL's and self-care      AM-PAC OT "6 Clicks" Daily Activity     Outcome Measure Help from another person eating meals?: None Help from another person taking care of personal grooming?: A Little Help from another person toileting, which includes using toliet, bedpan, or urinal?: A Little Help from another person bathing (including washing, rinsing,  drying)?: A Little Help from another person to put on and taking off regular upper body clothing?: A Little Help from another person to put on and taking off regular lower body clothing?: A Little 6 Click Score: 19   End of Session Equipment Utilized During Treatment: Oxygen (2L) Nurse Communication: Mobility status;Other (comment) (BP)  Activity Tolerance: Patient tolerated treatment well Patient left: in chair;with call bell/phone within reach  OT Visit Diagnosis: Unsteadiness on feet (R26.81);Pain Pain - part of body:  (between shoulder blades)                Time: 6389-3734 OT Time Calculation (min): 48 min Charges:  OT General Charges $OT Visit: 1 Visit OT Evaluation $OT Eval Moderate Complexity: [place], OT/L   Acute OT Clinical Specialist [place] Pager [phone] Office [phone]   [facility] 05/05/2020, 3:17 PM

## 2020-05-05 NOTE — Progress Notes (Signed)
05/05/20 1608  PT Visit Information  Last PT Received On 05/05/20  Assistance Needed +1  PT/OT/SLP Co-Evaluation/Treatment Yes  Reason for Co-Treatment For patient/therapist safety  PT goals addressed during session Mobility/safety with mobility;Balance  History of Present Illness 70 year old female ongoing active smoker with 53 pack smoking history, sleep apnea on CPAP, CVAwith mild left-sided weakness in 2018, depression, anemia not otherwise specified.   Presented with several hours of upper abdominal and back pain associated with chest pain.  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  Also has aberrant right subclavian artery.  Precautions  Precautions Fall;Other (comment)  Precaution Comments BP lower than 120; HR @70  or below  Restrictions  Weight Bearing Restrictions No  Home Living  Family/patient expects to be discharged to: Private residence  Living Arrangements Spouse/significant other  Available Help at Discharge Available 24 hours/day  Type of [place] to enter  Entrance Stairs-Number of Steps 2  [place] One level  Bathroom Shower/Tub Walk-in shower;Tub/shower unit  Biochemist, clinical Handicapped height  Bathroom Accessibility Yes  Home Equipment BSC;Shower seat;Walker - 2 wheels;Hand held shower head  Prior Function  Level of Independence Independent  Comments L side a little weak from CVA; drives; independently manages medication  Communication  Communication HOH  Pain Assessment  Pain Assessment 0-10  Pain Score 8  Pain Location between shoulder blades  Pain Descriptors / Indicators Grimacing;Guarding;Discomfort  Cognition  Arousal/Alertness Awake/alert  Behavior During Therapy WFL for tasks assessed/performed  Overall Cognitive Status Within Functional Limits for tasks  assessed  Upper Extremity Assessment  Upper Extremity Assessment Defer to OT evaluation  Lower Extremity Assessment  Lower Extremity Assessment LLE deficits/detail  LLE Deficits / Details c/o L hip bursitis  Cervical / Trunk Assessment  Cervical / Trunk Assessment Normal  Bed Mobility  Overal bed mobility Needs Assistance  Bed Mobility Supine to Sit  Supine to sit Min guard  General bed mobility comments min guard for safety to come to sitting.  Transfers  Overall transfer level Needs assistance  Equipment used 1 person hand held assist  Transfers Sit to/from [employer] Transfers  Sit to Stand Min guard;+2 safety/equipment (steady assist)  Stand pivot transfers Min guard;+2 safety/equipment  General transfer comment Min guard +2 for safety to stand and transfer to chair. No overt LOB noted.  Balance  Overall balance assessment Needs assistance  Sitting-balance support No upper extremity supported  Sitting balance-Leahy Scale Good  Standing balance support No upper extremity supported  Standing balance-Leahy Scale Fair  General Comments  General comments (skin integrity, edema, etc.) Initial BP high taken on L forearm. Switched to regular BP cuff  and BP taken on R upper arm. BP 124/60; HR @ 80 in bed. Able to progress to EOB with BP 89/60; HR 81 without complaints of dizziness. Once in recliner BP 124/61; after sitting 5 min 106/68. SpO2 88 RA; 95 on 2L. Nsg made aware.  PT - End of Session  Activity Tolerance Patient tolerated treatment well  Patient left in chair;with call bell/phone within reach  Nurse Communication Mobility status;Other (comment) (BP)  PT Assessment  PT Recommendation/Assessment Patient needs continued PT services  PT Visit Diagnosis Other abnormalities of gait and mobility (R26.89)  PT Problem List Decreased strength;Decreased balance;Decreased mobility;Decreased activity tolerance;Cardiopulmonary status limiting activity  PT Plan  PT Frequency  (  ACUTE ONLY) Min 3X/week  PT Treatment/Interventions (ACUTE ONLY) DME instruction;Gait training;Functional mobility training;Stair training;Therapeutic activities;Therapeutic exercise;Balance training;Patient/family education  AM-PAC PT "6 Clicks" Mobility Outcome Measure (Version 2)  Help needed turning from your back to your side while in a flat bed without using bedrails? 4  Help needed moving from lying on your back to sitting on the side of a flat bed without using bedrails? 3  Help needed moving to and from a bed to a chair (including a wheelchair)? 3  Help needed standing up from a chair using your arms (e.g., wheelchair or bedside chair)? 3  Help needed to walk in hospital room? 3  Help needed climbing 3-5 steps with a railing?  2  6 Click Score 18  Consider Recommendation of Discharge To: Home with [facility]  PT Recommendation  Follow Up Recommendations Other (comment) (HHPT vs No PT follow up pending progression)  PT equipment None recommended by PT  Individuals Consulted  Consulted and Agree with Results and Recommendations Patient  Acute Rehab PT Goals  Patient Stated Goal to go home  PT Goal Formulation With patient  Time For Goal Achievement 05/19/20  Potential to Achieve Goals Good  PT Time Calculation  PT Start Time (ACUTE ONLY) 1401  PT Stop Time (ACUTE ONLY) 1448  PT Time Calculation (min) (ACUTE ONLY) 47 min  PT General Charges  $$ ACUTE PT VISIT 1 Visit  PT Evaluation  $PT Eval Moderate Complexity 1 Mod  PT Treatments  $Therapeutic Activity 8-22 mins  Written Expression  Dominant Hand Right    Pt admitted secondary to problem above with deficits above. Pt requiring min guard A for transfers this session. Initial BP high taken on L forearm. Switched to regular BP cuff  and BP taken on R upper arm. BP 124/60; HR @ 80 in bed. Able to progress to EOB with BP 89/60; HR 81 without complaints of dizziness. Once in recliner BP 124/61; after sitting 5 min 106/68. SpO2 88 RA;  95 on 2L. Nsg made aware. Anticipate pt will progress well. Will continue to follow acutely to maximize functional mobility independence and safety.   Reuel Derby, PT, DPT  Acute Rehabilitation Services  Pager: [phone] Office: [phone]

## 2020-05-05 NOTE — Progress Notes (Addendum)
Progress Note    05/05/2020 7:31 AM * No surgery found *  Subjective:  BP controlled with oral meds yesterday and she was transferred out of ICU. Her pain was better yesterday, but she said it worsened overnight. She is in no distress currently.   Vitals:   05/05/20 0400 05/05/20 0500  BP: (!) 106/57 (!) 107/56  Pulse: 81 80  Resp: 20 (!) 23  Temp: 98.4 F (36.9 C)   SpO2: 93% 94%    Physical Exam: General appearance: Awake, alert in no apparent distress Cardiac: Heart rate and rhythm are regular Respirations: Nonlabored Extremities: Both feet are warm with intact sensation and motor function.  2+ right DP and 2+ left PT pulses Abdomen: soft, ND, non-tender    CBC    Component Value Date/Time   WBC 11.5 (H) 05/01/2020 1610   RBC 4.31 05/01/2020 1610   HGB 12.9 05/01/2020 1610   HGB 13.4 02/19/2020 1604   HCT 40.5 05/01/2020 1610   HCT 40.8 02/19/2020 1604   PLT 179 05/01/2020 1610   PLT 200 02/19/2020 1604   MCV 94.0 05/01/2020 1610   MCV 91 02/19/2020 1604   MCV 90 10/29/2011 1553   MCH 29.9 05/01/2020 1610   MCHC 31.9 05/01/2020 1610   RDW 12.8 05/01/2020 1610   RDW 12.3 02/19/2020 1604   RDW 13.2 10/29/2011 1553   LYMPHSABS 2.5 01/05/2017 2328   MONOABS 0.6 01/05/2017 2328   EOSABS 0.2 01/05/2017 2328   BASOSABS 0.0 01/05/2017 2328    BMET    Component Value Date/Time   NA 131 (L) 05/05/2020 0146   NA 141 02/19/2020 1604   NA 141 10/29/2011 1553   K 3.2 (L) 05/05/2020 0146   K 3.3 (L) 10/29/2011 1553   CL 93 (L) 05/05/2020 0146   CL 108 (H) 10/29/2011 1553   CO2 24 05/05/2020 0146   CO2 26 10/29/2011 1553   GLUCOSE 93 05/05/2020 0146   GLUCOSE 104 (H) 10/29/2011 1553   BUN 12 05/05/2020 0146   BUN 17 02/19/2020 1604   BUN 18 10/29/2011 1553   CREATININE 0.70 05/05/2020 0146   CREATININE 0.87 10/29/2011 1553   CALCIUM 8.8 (L) 05/05/2020 0146   CALCIUM 9.1 10/29/2011 1553   GFRNONAA >60 05/05/2020 0146   GFRNONAA >60 10/29/2011 1553    GFRAA 78 02/19/2020 1604   GFRAA >60 10/29/2011 1553     Intake/Output Summary (Last 24 hours) at 05/05/2020 0731 Last data filed at 05/05/2020 0400 Gross per 24 hour  Intake 720 ml  Output -  Net 720 ml    HOSPITAL MEDICATIONS Scheduled Meds: . carvedilol  25 mg Oral BID WC  . Chlorhexidine Gluconate Cloth  6 each Topical Daily  . hydrochlorothiazide  12.5 mg Oral Daily  . mouth rinse  15 mL Mouth Rinse BID  . mometasone-formoterol  2 puff Inhalation BID  . nicotine  21 mg Transdermal Daily  . pantoprazole  40 mg Oral Daily  . sodium chloride flush  10-40 mL Intracatheter Q12H  . umeclidinium bromide  1 puff Inhalation Daily   Continuous Infusions: PRN Meds:.acetaminophen, docusate sodium, hydrALAZINE, HYDROmorphone (DILAUDID) injection, labetalol, ondansetron (ZOFRAN) IV, polyethylene glycol, sodium chloride flush  Assessment and Plan: Type B dissection. Repeat CTA yesterday stable. on oral anti-hypertensives. Pain improving Serum creatinine: normal Systolic BP range: 211H-417E  Plan will be to follow-up as outpatient with Dr. Oneida Alar with repeat  CTA in one month  Risa Grill, PA-C [facility] [phone] 05/05/2020  7:31 AM   Intermittent back and shoulder pain required some IV pain meds last pm Also needed IV BP meds Continue to manage pain and BP home when both controlled with oral agents CTA and office visit with me in 1 month  [name], MD [facility] [place] Office: [phone]

## 2020-05-06 LAB — URINALYSIS, COMPLETE (UACMP) WITH MICROSCOPIC
Bilirubin Urine: NEGATIVE
Glucose, UA: NEGATIVE mg/dL
Ketones, ur: 80 mg/dL — AB
Leukocytes,Ua: NEGATIVE
Nitrite: NEGATIVE
Protein, ur: 30 mg/dL — AB
Specific Gravity, Urine: 1.027 (ref 1.005–1.030)
pH: 6 (ref 5.0–8.0)

## 2020-05-06 LAB — CBC WITH DIFFERENTIAL/PLATELET
Abs Immature Granulocytes: 0.02 10*3/uL (ref 0.00–0.07)
Basophils Absolute: 0 10*3/uL (ref 0.0–0.1)
Basophils Relative: 0 %
Eosinophils Absolute: 0 10*3/uL (ref 0.0–0.5)
Eosinophils Relative: 0 %
HCT: 36.2 % (ref 36.0–46.0)
Hemoglobin: 12.6 g/dL (ref 12.0–15.0)
Immature Granulocytes: 0 %
Lymphocytes Relative: 13 %
Lymphs Abs: 0.8 10*3/uL (ref 0.7–4.0)
MCH: 31 pg (ref 26.0–34.0)
MCHC: 34.8 g/dL (ref 30.0–36.0)
MCV: 88.9 fL (ref 80.0–100.0)
Monocytes Absolute: 1.1 10*3/uL — ABNORMAL HIGH (ref 0.1–1.0)
Monocytes Relative: 18 %
Neutro Abs: 4.3 10*3/uL (ref 1.7–7.7)
Neutrophils Relative %: 69 %
Platelets: 160 10*3/uL (ref 150–400)
RBC: 4.07 MIL/uL (ref 3.87–5.11)
RDW: 12.8 % (ref 11.5–15.5)
WBC: 6.2 10*3/uL (ref 4.0–10.5)
nRBC: 0 % (ref 0.0–0.2)

## 2020-05-06 LAB — BASIC METABOLIC PANEL
Anion gap: 13 (ref 5–15)
BUN: 13 mg/dL (ref 8–23)
CO2: 23 mmol/L (ref 22–32)
Calcium: 9 mg/dL (ref 8.9–10.3)
Chloride: 95 mmol/L — ABNORMAL LOW (ref 98–111)
Creatinine, Ser: 0.71 mg/dL (ref 0.44–1.00)
GFR, Estimated: >60 mL/min (ref >60)
Glucose, Bld: 105 mg/dL — ABNORMAL HIGH (ref 70–99)
Potassium: 3.6 mmol/L (ref 3.5–5.1)
Sodium: 131 mmol/L — ABNORMAL LOW (ref 135–145)

## 2020-05-06 LAB — PROCALCITONIN: Procalcitonin: <0.1 ng/mL

## 2020-05-06 LAB — TRIGLYCERIDES: Triglycerides: 62 mg/dL (ref <150)

## 2020-05-06 MED ORDER — IBUPROFEN 400 MG PO TABS
400.0000 mg | ORAL_TABLET | Freq: Once | ORAL | Status: DC | PRN
  Filled 2020-05-06: qty 1

## 2020-05-06 MED ORDER — PIPERACILLIN-TAZOBACTAM 3.375 G IVPB
3.3750 g | Freq: Three times a day (TID) | INTRAVENOUS | Status: DC
  Administered 2020-05-06 – 2020-05-08 (×7): 3.375 g via INTRAVENOUS
  Filled 2020-05-06 (×7): qty 50

## 2020-05-06 MED ORDER — AMLODIPINE BESYLATE 5 MG PO TABS
5.0000 mg | ORAL_TABLET | ORAL | Status: DC
  Administered 2020-05-06 – 2020-05-08 (×3): 5 mg via ORAL
  Filled 2020-05-06 (×3): qty 1

## 2020-05-06 NOTE — Progress Notes (Signed)
PROGRESS NOTE    Angel Lane  OEU:[id number] DOB: [birth year] DOA: 05/01/2020 PCP: Virginia Crews, MD   Brief Narrative:  70 year old female ongoing active smoker with 53 pack smoking history, sleep apnea on CPAP prior history of stroke with mild left-sided weakness in 2018, depression, anemia not otherwise specified.  She is on statin and aspirin at home.  Presented with several hours of upper abdominal and back pain associated with chest pain.  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  Also has aberrant right subclavian artery.  Started on esmolol and Cleviprex with improvement in blood pressure and symptoms.  Vascular service consulted 05/01/2020 and did not discover any evidence of endorgan damage.  Critical care medicine admitting 05/01/2020.  Recommended blood pressure goal of systolic less than 154 and heart rate less than 60  Vascular services also recommending - Would consider repair if she has progressive enlargement of her aorta or endorgan compromise. Most likely she would need right carotid subclavian bypass in addition to stent graft that she requires repair due to her aberrant right subclavian artery.  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  Both of these were discontinued on 05/03/2020.  Patient was transferred under [place] on 05/04/2020.  Repeat CT angiogram of chest and abdomen was done on 05/04/2020 which shows a stable aortic dissection type B. She feels better as far as her chest pain goes. She is off of oxygen and denies any shortness of breath either. Blood pressure however  remains fluctuating and sometimes elevated above the goal.  Personally discussed with Dr. Oneida Alar of vascular surgery and according to him systolic blood pressure up to 130 is acceptable however he prefers to keep it less than 120 as much as we can. Will continue current dose of Coreg and hydrochlorothiazide 25 mg and will add amlodipine 5 mg p.o. daily and continue as needed hydralazine and labetalol.  Hypokalemia: Resolved.  Stroke 2018 with mild residual left lower extremity weakness baseline: Noted  Greater than 50 pack smoking history Clinical evidence of emphysema/COPD on CT scan History of sleep apnea on CPAP at home  Continue bronchodilators with Spiriva and Dulera Continued smoking cessation CPAP at home  Acute hypoxic respiratory failure/bibasilar consolidations and effusions/hospital-acquired pneumonia: Procalcitonin unremarkable again today however patient had high-grade fever up to 102.2 last night and this raises suspicion for possible pneumonia. She does not have leukocytosis either. I will start her on Zosyn. Blood cultures have been drawn. UA is negative for any UTI..    DVT prophylaxis: SCDs Start: 05/01/20 2247   Code Status: Full Code  Family Communication: None present at bedside.  Plan of care discussed with patient in length and he verbalized understanding and agreed with it. Later on also discussed with husband over the phone.  Status is: Inpatient  Remains inpatient appropriate because:Ongoing diagnostic testing needed not appropriate for outpatient work up   Dispo: The patient is from: Home              Anticipated d/c is to: Home with home health likely tomorrow.  Patient currently is not medically stable to d/c.   Difficult to place patient No        Estimated body mass index is 31.59 kg/m as calculated from the following:   Height as of this encounter: 5\' 5"  (1.651 m).   Weight as of this encounter: 86.1 kg.      Nutritional  status:               Consultants:   Vascular surgery  Procedures:   None  Antimicrobials:  Anti-infectives (From admission, onward)   Start     Dose/Rate Route Frequency Ordered Stop   05/06/20 1000  piperacillin-tazobactam (ZOSYN) IVPB 3.375 g        3.375 g 12.5 mL/hr over 240 Minutes Intravenous Every 8 hours 05/06/20 0832           Subjective: Seen and examined. Overall feels better than yesterday. She is concerned about the high-grade fever that she had overnight, last temperature was this morning at 6 AM. No shortness of breath. Her pain is now at right lower anterior chest. No more central chest pain or posterior back pain.  Objective: Vitals:   05/06/20 0826 05/06/20 1000 05/06/20 1100 05/06/20 1421  BP: 129/72 116/68 107/69 138/79  Pulse: 83 77 90   Resp:   (!) 22   Temp:   99 F (37.2 C)   TempSrc:   Oral   SpO2: 94% 92% 93%   Weight:      Height:        Intake/Output Summary (Last 24 hours) at 05/06/2020 1511 Last data filed at 05/05/2020 1637 Gross per 24 hour  Intake 352.87 ml  Output --  Net 352.87 ml   Filed Weights   05/04/20 0600 05/05/20 0330 05/06/20 0600  Weight: 84.3 kg 90.4 kg 86.1 kg    Examination:  General exam: Appears calm and comfortable  Respiratory system: Diminished breath sounds bibasilar. Respiratory effort normal. Cardiovascular system: S1 & S2 heard, RRR. No JVD, murmurs, rubs, gallops or clicks. No pedal edema. Gastrointestinal system: Abdomen is nondistended, soft and nontender. No organomegaly or masses felt. Normal bowel sounds heard. Central nervous system: Alert and oriented. No focal neurological deficits. Extremities: Symmetric 5 x 5 power. Skin: No rashes, lesions or ulcers.  Psychiatry: Judgement and insight appear normal. Mood & affect appropriate.  .   Data Reviewed: I have personally reviewed following labs and imaging studies  CBC: Recent Labs  Lab 05/01/20 1610 05/05/20 1000 05/06/20 0845   WBC 11.5* 9.0 6.2  NEUTROABS  --  6.6 4.3  HGB 12.9 12.2 12.6  HCT 40.5 36.5 36.2  MCV 94.0 90.8 88.9  PLT 179 197 119   Basic Metabolic Panel: Recent Labs  Lab 05/01/20 1610 05/02/20 0349 05/04/20 0035 05/05/20 0146 05/06/20 0845  NA 137 136 132* 131* 131*  K 3.6 4.0 3.4* 3.2* 3.6  CL 103 104 97* 93* 95*  CO2 24 23 26 24 23   GLUCOSE 131* 148* 108* 93 105*  BUN 18 14 16 12 13   CREATININE 0.73 0.69 0.73 0.70 0.71  CALCIUM 9.1 8.9 8.6* 8.8* 9.0  MG  --  1.9  --   --   --   PHOS  --  3.4  --   --   --    GFR: Estimated Creatinine Clearance: 69.9 mL/min (by C-G formula based on SCr of 0.71 mg/dL). Liver Function Tests: Recent Labs  Lab 05/01/20 1610  AST 21  ALT 18  ALKPHOS 67  BILITOT 1.1  PROT 6.4*  ALBUMIN 3.4*   Recent Labs  Lab 05/01/20 1610  LIPASE 31   No results for input(s): AMMONIA in the last 168 hours. Coagulation Profile: No results for input(s): INR, PROTIME in the last 168 hours. Cardiac Enzymes: No results for input(s): CKTOTAL, CKMB, CKMBINDEX, TROPONINI in the last 168 hours. BNP (last 3 results) No results for input(s): PROBNP in the last 8760 hours. HbA1C: No results for input(s): HGBA1C in the last 72 hours. CBG: Recent Labs  Lab 05/01/20 1619  GLUCAP 126*   Lipid Profile: Recent Labs    05/06/20 0501  TRIG 62   Thyroid Function Tests: No results for input(s): TSH, T4TOTAL, FREET4, T3FREE, THYROIDAB in the last 72 hours. Anemia Panel: No results for input(s): VITAMINB12, FOLATE, FERRITIN, TIBC, IRON, RETICCTPCT in the last 72 hours. Sepsis Labs: Recent Labs  Lab 05/05/20 0854 05/06/20 0845  PROCALCITON <0.10 <0.10    Recent Results (from the past 240 hour(s))  Resp Panel by RT-PCR (Flu A&B, Covid) Nasopharyngeal Swab     Status: None   Collection Time: 05/01/20  4:03 PM   Specimen: Nasopharyngeal Swab; Nasopharyngeal(NP) swabs in vial transport medium  Result Value Ref Range Status   SARS Coronavirus 2 by RT PCR  NEGATIVE NEGATIVE Final    Comment: (NOTE) SARS-CoV-2 target nucleic acids are NOT DETECTED.  The SARS-CoV-2 RNA is generally detectable in upper respiratory specimens during the acute phase of infection. The lowest concentration of SARS-CoV-2 viral copies this assay can detect is 138 copies/mL. A negative result does not preclude SARS-Cov-2 infection and should not be used as the sole basis for treatment or other patient management decisions. A negative result may occur with  improper specimen collection/handling, submission of specimen other than nasopharyngeal swab, presence of viral mutation(s) within the areas targeted by this assay, and inadequate number of viral copies(<138 copies/mL). A negative result must be combined with clinical observations, patient history, and epidemiological information. The expected result is Negative.  Fact Sheet for Patients:  EntrepreneurPulse.com.au  Fact Sheet for Healthcare Providers:  IncredibleEmployment.be  This test is no t yet approved or cleared by the [place] FDA and  has been authorized for detection and/or diagnosis of SARS-CoV-2 by FDA under an Emergency Use Authorization (EUA). This EUA will remain  in effect (meaning this test can be used) for the duration of the COVID-19 declaration under Section 564(b)(1) of the Act, 21 U.S.C.section 360bbb-3(b)(1), unless the authorization is terminated  or revoked sooner.       Influenza A by PCR NEGATIVE NEGATIVE Final   Influenza B by PCR NEGATIVE NEGATIVE Final    Comment: (NOTE) The Xpert Xpress SARS-CoV-2/FLU/RSV plus assay is intended as an aid in the diagnosis of influenza from Nasopharyngeal swab specimens and should not be used as a sole basis for treatment. Nasal washings and aspirates are unacceptable for Xpert Xpress SARS-CoV-2/FLU/RSV testing.  Fact Sheet for Patients: EntrepreneurPulse.com.au  Fact Sheet for  Healthcare Providers: IncredibleEmployment.be  This test is not yet approved or cleared by the [place] FDA and has been authorized for detection and/or diagnosis of SARS-CoV-2 by FDA under an Emergency Use Authorization (EUA). This EUA will remain in effect (meaning this test can be used) for the duration of the COVID-19 declaration under Section 564(b)(1) of the Act, 21 U.S.C. section 360bbb-3(b)(1), unless the authorization is terminated or revoked.  Performed at [facility], [place] [address] [place], [place] [address]   MRSA PCR Screening  Status: None   Collection Time: 05/02/20 12:25 AM   Specimen: Nasopharyngeal  Result Value Ref Range Status   MRSA by PCR NEGATIVE NEGATIVE Final    Comment:        The GeneXpert MRSA Assay (FDA approved for NASAL specimens only), is one component of a comprehensive MRSA colonization surveillance program. It is not intended to diagnose MRSA infection nor to guide or monitor treatment for MRSA infections. Performed at [facility], [place] [address], [place], [place] [address]   Culture, blood (routine x 2)     Status: None (Preliminary result)   Collection Time: 05/06/20 12:49 AM   Specimen: BLOOD RIGHT HAND  Result Value Ref Range Status   Specimen Description BLOOD RIGHT HAND  Final   Special Requests   Final    BOTTLES DRAWN AEROBIC AND ANAEROBIC Blood Culture adequate volume   Culture   Final    NO GROWTH < 12 HOURS Performed at [facility], [place] [address] [place], [place] [address]    Report Status PENDING  Incomplete  Culture, blood (routine x 2)     Status: None (Preliminary result)   Collection Time: 05/06/20 12:49 AM   Specimen: BLOOD LEFT HAND  Result Value Ref Range Status   Specimen Description BLOOD LEFT HAND  Final   Special Requests   Final    BOTTLES DRAWN AEROBIC AND ANAEROBIC Blood Culture adequate volume   Culture   Final    NO GROWTH < 12 HOURS Performed at [facility], [place] [address] [place], [place] [address]    Report Status PENDING  Incomplete      Radiology Studies: No results found.  Scheduled Meds: . amLODipine  5 mg Oral Q24H  . carvedilol  25 mg Oral BID WC  . Chlorhexidine Gluconate Cloth  6 each Topical Daily  . hydrochlorothiazide  25 mg Oral Daily  . mouth rinse  15 mL Mouth Rinse BID  . mometasone-formoterol  2 puff Inhalation BID  . nicotine  21 mg Transdermal Daily  . pantoprazole  40 mg Oral Daily  . sodium chloride flush  10-40 mL Intracatheter Q12H  . umeclidinium bromide  1 puff Inhalation Daily   Continuous Infusions: . piperacillin-tazobactam (ZOSYN)  IV 3.375 g (05/06/20 1124)     LOS: 5 days   Time spent: 31 minutes   Darliss Cheney, MD [facility]  05/06/2020, 3:11 PM   To contact the attending provider between 7A-7P or the covering provider during after hours 7P-7A, please log into the web site [email or web address].

## 2020-05-06 NOTE — Progress Notes (Signed)
Physical Therapy Treatment Patient Details Name: Angel Lane MRN: [id number] DOB: [birth year] Today's Date: 05/06/2020    History of Present Illness 70 year old female ongoing active smoker with 53 pack smoking history. Presented with several hours of upper abdominal and back pain associated with chest pain.  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  Also has aberrant right subclavian artery. Symptomatic orthostatic hypotension 3/3 during PT treatment session.   PMH: sleep apnea on CPAP, CVA with mild left-sided weakness in 2018, depression, anemia not otherwise specified.    PT Comments    Pt received in sidelying to L, agreeable to therapy session and with good participation and tolerance for mobility. Primary session focus on seated/standing BLE exercises for strength/pre-gait training. Pt limited due to symptomatic orthostatic hypotension, RN notified (see flowsheet and vitals below). Pt initially performed standing exercises at RW without symptoms, however upon second standing transfer pt c/o dizziness/nausea and BP noted to be 80/62 in standing, a drop from BP 115/59 while seated taken just before standing. Pt min guard assist at most for all mobility tasks. VSS on RA, some cues needed for slow, deep breaths. Pt continues to benefit from PT services to progress toward functional mobility goals. Continue to recommend HHPT.  Follow Up Recommendations  Home health PT     Equipment Recommendations  None recommended by PT (continue to assess pending progression)    Recommendations for Other Services       Precautions / Restrictions Precautions Precautions: Fall;Other (comment) Precaution Comments: BP 100-120's; HR @70  or below (per RN pt has not been in this HR range past couple days) Restrictions Weight Bearing Restrictions: No    Mobility  Bed  Mobility Overal bed mobility: Needs Assistance Bed Mobility: Supine to Sit     Supine to sit: Min guard     General bed mobility comments: min guard for safety to come to sitting, use of bed features    Transfers Overall transfer level: Needs assistance Equipment used: Rolling walker (2 wheeled) Transfers: Sit to/from Stand Sit to Stand: Min guard         General transfer comment: from EOB to RW x2 reps, sidesteps toward [facility] with min guard  Ambulation/Gait                 Stairs             Wheelchair Mobility    Modified Rankin (Stroke Patients Only)       Balance Overall balance assessment: Needs assistance Sitting-balance support: No upper extremity supported Sitting balance-Leahy Scale: Good     Standing balance support: Single extremity supported;Bilateral upper extremity supported Standing balance-Leahy Scale: Fair Standing balance comment: able to stand with U UE support but mostly BUE support of RW due to evolving symptoms orthostatic hypotension                            Cognition Arousal/Alertness: Awake/alert Behavior During Therapy: WFL for tasks assessed/performed Overall Cognitive Status: Within Functional Limits for tasks assessed                                        Exercises General Exercises - Lower Extremity Ankle Circles/Pumps: AROM;Both;20 reps;Seated Long Arc Quad: AROM;Both;10 reps;Seated Hip  Flexion/Marching: AROM;Both;Seated;20 reps;Standing (x10 seated, x10 standing) Heel Raises: AROM;Both;10 reps;Standing Mini-Sqauts: AROM;Both;10 reps;Standing    General Comments General comments (skin integrity, edema, etc.): (all readings taken in R upper arm); BP 138/79 supine, seated BP 141/85, second seated BP 115/59 (after initial sit<>stand), Standing BP 80/62 and symptoms dizziness. Pt returned to supine and BP 131/66 with resolving symptoms. HR 81 bpm resting and HR max 95 bpm during  seated/standing tasks. SpO2 mostly 91-95% on RA (finger sensor switched during session to L hand due to poor reading on R hand)      Pertinent Vitals/Pain Pain Assessment: Faces Faces Pain Scale: Hurts little more Pain Location: R lower back/ posterior abdomen Pain Descriptors / Indicators: Grimacing;Guarding;Discomfort Pain Intervention(s): Monitored during session;Premedicated before session;Repositioned    Home Living                      Prior Function            PT Goals (current goals can now be found in the care plan section) Acute Rehab PT Goals Patient Stated Goal: to go home when she is feeling better PT Goal Formulation: With patient Time For Goal Achievement: 05/19/20 Potential to Achieve Goals: Good Progress towards PT goals: Progressing toward goals (slow progress, low BP limiting)    Frequency    Min 3X/week      PT Plan Current plan remains appropriate    Co-evaluation PT/OT/SLP Co-Evaluation/Treatment: Yes            AM-PAC PT "6 Clicks" Mobility   Outcome Measure  Help needed turning from your back to your side while in a flat bed without using bedrails?: None Help needed moving from lying on your back to sitting on the side of a flat bed without using bedrails?: A Little Help needed moving to and from a bed to a chair (including a wheelchair)?: A Little Help needed standing up from a chair using your arms (e.g., wheelchair or bedside chair)?: A Little Help needed to walk in hospital room?: A Little Help needed climbing 3-5 steps with a railing? : A Lot 6 Click Score: 18    End of Session Equipment Utilized During Treatment: Gait belt Activity Tolerance: Patient tolerated treatment well Patient left: with call bell/phone within reach;in bed;with bed alarm set (heels floated/elevated, RN aware) Nurse Communication: Mobility status;Other (comment) (BP) PT Visit Diagnosis: Other abnormalities of gait and mobility (R26.89)     Time:  9741-6384 PT Time Calculation (min) (ACUTE ONLY): 30 min  Charges:  $Therapeutic Exercise: 8-22 mins $Therapeutic Activity: 8-22 mins                     Carly P., PTA Acute Rehabilitation Services Pager: [phone] Office: [place] 05/06/2020, 3:50 PM

## 2020-05-06 NOTE — Progress Notes (Addendum)
[facility]  Subjective  - Patient has developed fever during the night TM 102.  She denise CP this am.   Objective 137/66 89 99.6 F (37.6 C) (Oral) (!) 25 92%  Intake/Output Summary (Last 24 hours) at 05/06/2020 0748 Last data filed at 05/05/2020 1637 Gross per 24 hour  Intake 352.87 ml  Output -  Net 352.87 ml    Palpable pedal pulses Abdomin soft Lungs non labored breathing 683-729 systolic  Generalized not feeling well  Assessment/Planning: Type B aortic dissection  Bp control difficult over night and now with fever. Cont. To need IV antihypertensive medication.  We will need better control of BP on oral medication prior to discharge. UA ordered and blood cultures pending   [name] 05/06/2020 7:48 AM -- I agree with above.  Chest pain is somewhat improved.  Fever work-up in progress.  No leukocytosis on March 2.  She really has no significant abdominal pain and her chest pain is improving.  Feet are well-perfused.  Still working on blood pressure control.  [name], MD [facility] [place] Office: [phone]  Laboratory Lab Results: Recent Labs    05/05/20 1000  WBC 9.0  HGB 12.2  HCT 36.5  PLT 197   BMET Recent Labs    05/04/20 0035 05/05/20 0146  NA 132* 131*  K 3.4* 3.2*  CL 97* 93*  CO2 26 24  GLUCOSE 108* 93  BUN 16 12  CREATININE 0.73 0.70  CALCIUM 8.6* 8.8*    COAG Lab Results  Component Value Date   INR 0.96 01/05/2017   No results found for: PTT

## 2020-05-06 NOTE — Care Management Important Message (Signed)
Important Message  Patient Details  Name: Angel Lane MRN: [id number] Date of Birth: 10-18-1950   Medicare Important Message Given:  Yes     [name] 05/06/2020, 9:28 AM

## 2020-05-06 NOTE — Progress Notes (Signed)
Pharmacy Antibiotic Note  Angel Lane is a 70 y.o. female admitted on 05/01/2020 with type B aortic dissection.  Now with concern for aspiration PNA and Pharmacy has been consulted for Zosyn dosing.  Renal function stable, Tmax 102.2, WBC WNL, PCT negative. Noted intolerance to PCN.  Plan: Zosyn EID 3.375gm IV Q8H Pharmacy will sign off with stable renal function.  Thank you for the consult!  Height: 5\' 5"  (165.1 cm) Weight: 86.1 kg (189 lb 13.1 oz) IBW/kg (Calculated) : 57  Temp (24hrs), Avg:100.3 F (37.9 C), Min:98.2 F (36.8 C), Max:102.8 F (39.3 C)  Recent Labs  Lab 05/01/20 1610 05/02/20 0349 05/04/20 0035 05/05/20 0146 05/05/20 1000  WBC 11.5*  --   --   --  9.0  CREATININE 0.73 0.69 0.73 0.70  --     Estimated Creatinine Clearance: 69.9 mL/min (by C-G formula based on SCr of 0.7 mg/dL).    Allergies  Allergen Reactions  . Penicillins Other (See Comments)    chills    Elva Breaker D. Mina Marble, PharmD, BCPS, [place] 05/06/2020, 8:43 AM

## 2020-05-07 ENCOUNTER — Encounter (HOSPITAL_COMMUNITY): Payer: Self-pay | Admitting: Internal Medicine

## 2020-05-07 LAB — BASIC METABOLIC PANEL
Anion gap: 15 (ref 5–15)
BUN: 17 mg/dL (ref 8–23)
CO2: 23 mmol/L (ref 22–32)
Calcium: 9 mg/dL (ref 8.9–10.3)
Chloride: 94 mmol/L — ABNORMAL LOW (ref 98–111)
Creatinine, Ser: 0.79 mg/dL (ref 0.44–1.00)
GFR, Estimated: >60 mL/min (ref >60)
Glucose, Bld: 98 mg/dL (ref 70–99)
Potassium: 3.5 mmol/L (ref 3.5–5.1)
Sodium: 132 mmol/L — ABNORMAL LOW (ref 135–145)

## 2020-05-07 NOTE — Progress Notes (Signed)
PROGRESS NOTE    Angel Lane  GYF:[id number] DOB: 06-19-1950 DOA: 05/01/2020 PCP: Virginia Crews, MD   Brief Narrative:  70 year old female ongoing active smoker with 53 pack smoking history, sleep apnea on CPAP prior history of stroke with mild left-sided weakness in 2018, depression, anemia not otherwise specified.  She is on statin and aspirin at home.  Presented with several hours of upper abdominal and back pain associated with chest pain.  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  Also has aberrant right subclavian artery.  Started on esmolol and Cleviprex with improvement in blood pressure and symptoms. Vascular service consulted 05/01/2020 and did not discover any evidence of endorgan damage.  Critical care medicine admitted 05/01/2020.  Recommended blood pressure goal of systolic less than 916 and heart rate less than 60  Vascular services also recommended that theywould consider repair if she has progressive enlargement of her aorta or endorgan compromise. Most likely she would need right carotid subclavian bypass in addition to stent graft that she requires repair due to her aberrant right subclavian artery.  Patient was transferred under [facility] on 05/04/2020.  Repeat CT angiogram of chest and abdomen was done on 05/04/2020 which shows a stable aortic dissection type B.  She continues to have some chest pain and scapular pain.  Her blood pressure medications were being adjusted.  On top of Coreg high-dose 25 mg twice daily, her hydrochlorothiazide was increased to 25 mg and then amlodipine 5 mg was added.  In the interim, she developed high-grade fever up to 102.2.  CT chest was concerning for possible atelectasis bilaterally.  She did not have any leukocytosis and procalcitonin was unremarkable however due to fever, HCAP was considered highly so she was  started on Zosyn.  She then had orthostatic hypotension.  Her fever improved.  PT OT evaluated her.  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  Both of these were discontinued on 05/03/2020.  Patient was transferred under [facility] on 05/04/2020.  Repeat CT angiogram of chest and abdomen was done on 05/04/2020 which shows a stable aortic dissection type B. She feels better as far as her chest pain goes. She is off of oxygen and denies any shortness of breath either. Blood pressure however remains fluctuating and sometimes elevated above the goal.  Personally discussed with Dr. Oneida Alar of vascular surgery and according to him systolic blood pressure up to 130 is acceptable however he prefers to keep it less than 120 as much as we can.  She had an episode of orthostatic hypotension yesterday.  Blood pressure now seems to be much better and within target range.  Will continue current dose of Coreg and hydrochlorothiazide 25 mg and  amlodipine 5 mg p.o. daily and continue as needed hydralazine and labetalol.  Will order orthostatic vitals every 8 hours.  Hypokalemia: Resolved.  Stroke 2018 with mild residual left lower extremity weakness baseline: Noted  Greater than 50 pack smoking history Clinical evidence of emphysema/COPD on CT scan History of sleep apnea on CPAP at home  Continue bronchodilators with Spiriva and Dulera Continued smoking cessation CPAP at home  Acute  hypoxic respiratory failure/bibasilar consolidations and effusions/hospital-acquired pneumonia: Procalcitonin unremarkable again today however patient had high-grade fever up to 102.2 last night and this raises suspicion for possible pneumonia. She does not have leukocytosis either.  I will start her on Zosyn. Blood cultures have been drawn. UA is negative for any UTI..    DVT prophylaxis: SCDs Start: 05/01/20 2247   Code Status: Full Code  Family Communication: None present at bedside.  Plan of care discussed with patient in length and he verbalized understanding and agreed with it. Later on also discussed with husband over the phone.  Status is: Inpatient  Remains inpatient appropriate because:Ongoing diagnostic testing needed not appropriate for outpatient work up   Dispo: The patient is from: Home              Anticipated d/c is to: Home with home health likely tomorrow.  Due to low-grade fever and orthostatic hypotension, will monitor another day.              Patient currently is not medically stable to d/c.   Difficult to place patient No        Estimated body mass index is 31.66 kg/m as calculated from the following:   Height as of this encounter: 5\' 5"  (1.651 m).   Weight as of this encounter: 86.3 kg.      Nutritional status:               Consultants:   Vascular surgery  Procedures:   None  Antimicrobials:  Anti-infectives (From admission, onward)   Start     Dose/Rate Route Frequency Ordered Stop   05/06/20 1000  piperacillin-tazobactam (ZOSYN) IVPB 3.375 g        3.375 g 12.5 mL/hr over 240 Minutes Intravenous Every 8 hours 05/06/20 0832           Subjective: Seen and examined.  Overall feels better but now she complains of left scapular pain, and she verbalizes that " this is the exact pain that she came into the hospital with but it is not as severe as it was".  She does not have any other complaint or any shortness of breath.  Objective: Vitals:   05/07/20 0557 05/07/20 0743 05/07/20 0841 05/07/20 1041  BP:  125/60  128/61  Pulse:  74 79 83  Resp:  17 16 17   Temp:  98.4 F (36.9 C)  98.4 F (36.9 C)  TempSrc:  Oral  Oral  SpO2:  94% 100% 95%  Weight: 86.3 kg     Height:        Intake/Output Summary  (Last 24 hours) at 05/07/2020 1114 Last data filed at 05/06/2020 1607 Gross per 24 hour  Intake 50 ml  Output 500 ml  Net -450 ml   Filed Weights   05/05/20 0330 05/06/20 0600 05/07/20 0557  Weight: 90.4 kg 86.1 kg 86.3 kg    Examination:  General exam: Appears calm and comfortable  Respiratory system: Diminished breath sounds bilaterally. Respiratory effort normal. Cardiovascular system: S1 & S2 heard, RRR. No JVD, murmurs, rubs, gallops or clicks. No pedal edema. Gastrointestinal system: Abdomen is nondistended, soft and nontender. No organomegaly or masses felt. Normal bowel sounds heard. Central nervous system: Alert and oriented. No focal neurological deficits. Extremities: Symmetric 5 x 5 power. Skin: No rashes, lesions or ulcers.  Psychiatry: Judgement and insight appear normal. Mood & affect appropriate.    Data Reviewed: I have personally reviewed following labs and imaging studies  CBC: Recent  Labs  Lab 05/01/20 1610 05/05/20 1000 05/06/20 0845  WBC 11.5* 9.0 6.2  NEUTROABS  --  6.6 4.3  HGB 12.9 12.2 12.6  HCT 40.5 36.5 36.2  MCV 94.0 90.8 88.9  PLT 179 197 935   Basic Metabolic Panel: Recent Labs  Lab 05/02/20 0349 05/04/20 0035 05/05/20 0146 05/06/20 0845 05/07/20 0235  NA 136 132* 131* 131* 132*  K 4.0 3.4* 3.2* 3.6 3.5  CL 104 97* 93* 95* 94*  CO2 23 26 24 23 23   GLUCOSE 148* 108* 93 105* 98  BUN 14 16 12 13 17   CREATININE 0.69 0.73 0.70 0.71 0.79  CALCIUM 8.9 8.6* 8.8* 9.0 9.0  MG 1.9  --   --   --   --   PHOS 3.4  --   --   --   --    GFR: Estimated Creatinine Clearance: 70 mL/min (by C-G formula based on SCr of 0.79 mg/dL). Liver Function Tests: Recent Labs  Lab 05/01/20 1610  AST 21  ALT 18  ALKPHOS 67  BILITOT 1.1  PROT 6.4*  ALBUMIN 3.4*   Recent Labs  Lab 05/01/20 1610  LIPASE 31   No results for input(s): AMMONIA in the last 168 hours. Coagulation Profile: No results for input(s): INR, PROTIME in the last 168  hours. Cardiac Enzymes: No results for input(s): CKTOTAL, CKMB, CKMBINDEX, TROPONINI in the last 168 hours. BNP (last 3 results) No results for input(s): PROBNP in the last 8760 hours. HbA1C: No results for input(s): HGBA1C in the last 72 hours. CBG: Recent Labs  Lab 05/01/20 1619  GLUCAP 126*   Lipid Profile: Recent Labs    05/06/20 0501  TRIG 62   Thyroid Function Tests: No results for input(s): TSH, T4TOTAL, FREET4, T3FREE, THYROIDAB in the last 72 hours. Anemia Panel: No results for input(s): VITAMINB12, FOLATE, FERRITIN, TIBC, IRON, RETICCTPCT in the last 72 hours. Sepsis Labs: Recent Labs  Lab 05/05/20 0854 05/06/20 0845  PROCALCITON <0.10 <0.10    Recent Results (from the past 240 hour(s))  Resp Panel by RT-PCR (Flu A&B, Covid) Nasopharyngeal Swab     Status: None   Collection Time: 05/01/20  4:03 PM   Specimen: Nasopharyngeal Swab; Nasopharyngeal(NP) swabs in vial transport medium  Result Value Ref Range Status   SARS Coronavirus 2 by RT PCR NEGATIVE NEGATIVE Final    Comment: (NOTE) SARS-CoV-2 target nucleic acids are NOT DETECTED.  The SARS-CoV-2 RNA is generally detectable in upper respiratory specimens during the acute phase of infection. The lowest concentration of SARS-CoV-2 viral copies this assay can detect is 138 copies/mL. A negative result does not preclude SARS-Cov-2 infection and should not be used as the sole basis for treatment or other patient management decisions. A negative result may occur with  improper specimen collection/handling, submission of specimen other than nasopharyngeal swab, presence of viral mutation(s) within the areas targeted by this assay, and inadequate number of viral copies(<138 copies/mL). A negative result must be combined with clinical observations, patient history, and epidemiological information. The expected result is Negative.  Fact Sheet for Patients:  EntrepreneurPulse.com.au  Fact Sheet  for Healthcare Providers:  IncredibleEmployment.be  This test is no t yet approved or cleared by the [place] FDA and  has been authorized for detection and/or diagnosis of SARS-CoV-2 by FDA under an Emergency Use Authorization (EUA). This EUA will remain  in effect (meaning this test can be used) for the duration of the COVID-19 declaration under Section 564(b)(1) of the Act,  21 U.S.C.section 360bbb-3(b)(1), unless the authorization is terminated  or revoked sooner.       Influenza A by PCR NEGATIVE NEGATIVE Final   Influenza B by PCR NEGATIVE NEGATIVE Final    Comment: (NOTE) The Xpert Xpress SARS-CoV-2/FLU/RSV plus assay is intended as an aid in the diagnosis of influenza from Nasopharyngeal swab specimens and should not be used as a sole basis for treatment. Nasal washings and aspirates are unacceptable for Xpert Xpress SARS-CoV-2/FLU/RSV testing.  Fact Sheet for Patients: EntrepreneurPulse.com.au  Fact Sheet for Healthcare Providers: IncredibleEmployment.be  This test is not yet approved or cleared by the [place] FDA and has been authorized for detection and/or diagnosis of SARS-CoV-2 by FDA under an Emergency Use Authorization (EUA). This EUA will remain in effect (meaning this test can be used) for the duration of the COVID-19 declaration under Section 564(b)(1) of the Act, 21 U.S.C. section 360bbb-3(b)(1), unless the authorization is terminated or revoked.  Performed at [facility], [place] [address] [place], [place] [address]   MRSA PCR Screening     Status: None   Collection Time: 05/02/20 12:25 AM   Specimen: Nasopharyngeal  Result Value Ref Range Status   MRSA by PCR NEGATIVE NEGATIVE Final    Comment:        The GeneXpert MRSA Assay (FDA approved for NASAL specimens only), is one component of a comprehensive MRSA colonization surveillance program. It is not intended to diagnose  MRSA infection nor to guide or monitor treatment for MRSA infections. Performed at [facility], [place] [address], [place], [place] [address]   Culture, blood (routine x 2)     Status: None (Preliminary result)   Collection Time: 05/06/20 12:49 AM   Specimen: BLOOD RIGHT HAND  Result Value Ref Range Status   Specimen Description BLOOD RIGHT HAND  Final   Special Requests   Final    BOTTLES DRAWN AEROBIC AND ANAEROBIC Blood Culture adequate volume   Culture   Final    NO GROWTH 1 DAY Performed at [facility], [place] [address], [place], [place] [address]    Report Status PENDING  Incomplete  Culture, blood (routine x 2)     Status: None (Preliminary result)   Collection Time: 05/06/20 12:49 AM   Specimen: BLOOD LEFT HAND  Result Value Ref Range Status   Specimen Description BLOOD LEFT HAND  Final   Special Requests   Final    BOTTLES DRAWN AEROBIC AND ANAEROBIC Blood Culture adequate volume   Culture   Final    NO GROWTH 1 DAY Performed at [facility], [place] [address] [place], [place] [address]    Report Status PENDING  Incomplete      Radiology Studies: No results found.  Scheduled Meds: . amLODipine  5 mg Oral Q24H  . carvedilol  25 mg Oral BID WC  . Chlorhexidine Gluconate Cloth  6 each Topical Daily  . hydrochlorothiazide  25 mg Oral Daily  . mouth rinse  15 mL Mouth Rinse BID  . mometasone-formoterol  2 puff Inhalation BID  . nicotine  21 mg Transdermal Daily  . pantoprazole  40 mg Oral Daily  . sodium chloride flush  10-40 mL Intracatheter Q12H  . umeclidinium bromide  1 puff Inhalation Daily   Continuous Infusions: . piperacillin-tazobactam (ZOSYN)  IV 3.375 g (05/07/20 1043)     LOS: 6 days   Time spent: 29 minutes   Darliss Cheney, MD [facility]  05/07/2020,  11:14 AM   To contact the attending provider between 7A-7P or the covering provider during after hours 7P-7A, please log into the web site [email or web address].

## 2020-05-07 NOTE — Progress Notes (Signed)
Pt states she doesn't want to wear CPAP tonight.

## 2020-05-07 NOTE — Progress Notes (Signed)
Physical Therapy Treatment Patient Details Name: Angel Lane MRN: [id number] DOB: [birth year] Today's Date: 05/07/2020    History of Present Illness Pt is 70 year old female admitted on 05/01/20. At that time, she presented with several hours of upper abdominal and back pain associated with chest pain.  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  Also has aberrant right subclavian artery. Pt has had critical care and vascular consults.  She has had episodes of orthostatic hypotension with therapy.   PMH: sleep apnea on CPAP, CVA with mild left-sided weakness in 2018, depression, anemia not otherwise specified.    PT Comments    Pt making good progress this afternoon.  She was able to tolerate standing and ambulation with asymptomatic orthostatic hypotension.  BP dropped with 3 mins standing but asymptomatic and improved with walking.  Educated on orthostatic hypotension management/safety.  May benefit from rollator for ambulation to allow place to sit if has orthostatic symptoms.    Orthostatic BPs  Supine 103/55  Sitting 105/52  Sitting after 3 min 107/64  Standing 102/58  Standing after 3 min 87/67 asymptomatic  Sitting immediate Post Walk   117/65      Follow Up Recommendations  Home health PT     Equipment Recommendations  Other (comment) (rollator)    Recommendations for Other Services       Precautions / Restrictions Precautions Precautions: Fall;Other (comment) (episodes of orthostatic hypotension) Precaution Comments: Order for goal of BP 100-120's/40-80's    Mobility  Bed Mobility Overal bed mobility: Modified Independent Bed Mobility: Supine to Sit;Sit to Supine     Supine to sit: Supervision Sit to supine: Supervision   General bed mobility comments: Education on orthostatic management - move slowly, sit for several mins  before standing, staying hydrated, AROM to promote bloodflow    Transfers Overall transfer level: Needs assistance Equipment used: Rolling walker (2 wheeled) Transfers: Sit to/from Stand Sit to Stand: Min guard         General transfer comment: Min guard for safety; performed x 3; again education on orthostatic hypotension - transferring slowing, weight shifting/marching in place, etc before walking  Ambulation/Gait Ambulation/Gait assistance: Min guard Gait Distance (Feet): 80 Feet Assistive device: Rolling walker (2 wheeled) Gait Pattern/deviations: Step-through pattern;Decreased stride length Gait velocity: decreased   General Gait Details: ambulated 23' then 20'x2; no orthostatic symptoms, safe gait with RW   Stairs             Wheelchair Mobility    Modified Rankin (Stroke Patients Only)       Balance Overall balance assessment: Needs assistance Sitting-balance support: No upper extremity supported Sitting balance-Leahy Scale: Good     Standing balance support: Bilateral upper extremity supported;No upper extremity supported Standing balance-Leahy Scale: Fair Standing balance comment: RW for ambulation and static stand/ADLs no AD                            Cognition Arousal/Alertness: Awake/alert Behavior During Therapy: WFL for tasks assessed/performed Overall Cognitive Status: Within Functional Limits for tasks assessed                                 General Comments: Reports doing much better and able to walk to bathroom earlier  Exercises      General Comments   Orthostatic BPs  Supine 103/55  Sitting 105/52  Sitting after 3 min 107/64  Standing 102/58  Standing after 3 min 87/67 asymptomatic  Sitting immediate Post Walk   117/65   Educated pt on safety with orthostatic hypotension including slow transfers, AROM exercises to promote blood flow with transfers, weight shifting/marching before walking, and  staying hydrated (as long as no fluid restrictions) and compression hose       Pertinent Vitals/Pain Pain Assessment: No/denies pain    Home Living                      Prior Function            PT Goals (current goals can now be found in the care plan section) Acute Rehab PT Goals Patient Stated Goal: to go home when she is feeling better PT Goal Formulation: With patient Time For Goal Achievement: 05/19/20 Potential to Achieve Goals: Good Progress towards PT goals: Progressing toward goals    Frequency    Min 3X/week      PT Plan Current plan remains appropriate    Co-evaluation              AM-PAC PT "6 Clicks" Mobility   Outcome Measure  Help needed turning from your back to your side while in a flat bed without using bedrails?: None Help needed moving from lying on your back to sitting on the side of a flat bed without using bedrails?: None Help needed moving to and from a bed to a chair (including a wheelchair)?: A Little Help needed standing up from a chair using your arms (e.g., wheelchair or bedside chair)?: A Little Help needed to walk in hospital room?: A Little Help needed climbing 3-5 steps with a railing? : A Little 6 Click Score: 20    End of Session Equipment Utilized During Treatment: Gait belt Activity Tolerance: Patient tolerated treatment well Patient left: with call bell/phone within reach;in bed;with bed alarm set;with family/visitor present Nurse Communication: Mobility status (improved BP) PT Visit Diagnosis: Other abnormalities of gait and mobility (R26.89)     Time: 6286-3817 PT Time Calculation (min) (ACUTE ONLY): 25 min  Charges:  $Gait Training: 8-22 mins $Therapeutic Activity: 8-22 mins                     Abran Richard, PT Acute Rehab Services Pager [phone] [facility] [place] 05/07/2020, 2:40 PM

## 2020-05-07 NOTE — Progress Notes (Signed)
Occupational Therapy Treatment Patient Details Name: Angel Lane MRN: [id number] DOB: 1950-07-28 Today's Date: 05/07/2020    History of present illness 70 year old female ongoing active smoker with 53 pack smoking history. Presented with several hours of upper abdominal and back pain associated with chest pain.  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  Also has aberrant right subclavian artery. Symptomatic orthostatic hypotension 3/3 during PT treatment session.   PMH: sleep apnea on CPAP, CVA with mild left-sided weakness in 2018, depression, anemia not otherwise specified.   OT comments  Pt making slow progress to current goals with only limitation being orthostatic hypotension.  BPs are as follows:  On arrival supine 119/67.  Sitting 130/65, standing 97/64 without dizziness.  Returned to sit and was 108/67.  Standing 80/50.  Stood on 3rd attempt after exercises in sitting and was 69/57 and symptomatic.  Once returned to sitting was 107/58.  Pt was too dizzy to ambulate in room so all adls performed at chair in sitting or standing.  Pt does not need assist with adls in sitting but in standing min guard provided due to dizziness.  Educated pt on energy conservation techniques for home and handout provided.     Follow Up Recommendations  No OT follow up;Supervision - Intermittent    Equipment Recommendations  None recommended by OT    Recommendations for Other Services      Precautions / Restrictions Precautions Precautions: Fall Precaution Comments: BP 100-120's; HR @70  or below (per RN pt has not been in this HR range past couple days) Restrictions Weight Bearing Restrictions: No       Mobility Bed Mobility Overal bed mobility: Needs Assistance Bed Mobility: Supine to Sit     Supine to sit: Supervision     General bed mobility comments:  supevision and cues to move slowly.    Transfers Overall transfer level: Needs assistance Equipment used: Rolling walker (2 wheeled) Transfers: Stand Pivot Transfers;Sit to/from Stand Sit to Stand: Min guard Stand pivot transfers: Min guard       General transfer comment: Pt requires cues for hand placement.    Balance Overall balance assessment: Needs assistance Sitting-balance support: No upper extremity supported Sitting balance-Leahy Scale: Good     Standing balance support: Single extremity supported;Bilateral upper extremity supported Standing balance-Leahy Scale: Fair Standing balance comment: Pt requires walker in standing due to dizziness from [facility].  Feel she will not need this if this is resolved.                           ADL either performed or assessed with clinical judgement   ADL Overall ADL's : Needs assistance/impaired Eating/Feeding: Independent   Grooming: Wash/dry hands;Wash/dry face;Oral care;Sitting Grooming Details (indicate cue type and reason): attempted in standing x3. Pt with orthostatic hypotention on all occasions; all with symptom of dizziness, so returned to sitting due to BP at 69/57. Upper Body Bathing: Set up;Sitting   Lower Body Bathing: Min guard;Sit to/from stand   Upper Body Dressing : Set up;Sitting   Lower Body Dressing: Sit to/from stand;Min guard   Toilet Transfer: Min Psychiatric nurse Details (indicate cue type and reason): to [facility]. Pt too dizzy to walk to bathroom. Pt would prefer to use BSC and not purewic. Toileting- Water quality scientist and Hygiene: Min guard  Functional mobility during ADLs: Min guard General ADL Comments: Pt completes basic adls without assist in sitting. Due to orthostatic hypotension, feel pt needs to have someone with her when standing and currently is becoming symptomtic with low BP.     Vision   Vision Assessment?: No apparent visual deficits   Perception     Praxis       Cognition Arousal/Alertness: Awake/alert Behavior During Therapy: WFL for tasks assessed/performed Overall Cognitive Status: Within Functional Limits for tasks assessed                                          Exercises Exercises: Other exercises Other Exercises Other Exercises: Instructed pt in exercises to assist in BP stablization cupping fingers together and pullling elbows straight out to sides, then relaxing for 10 reps.  Crossing ankles and pushing ankles into each other holding and relaxing for 10 reps.   Shoulder Instructions       General Comments Could not progress adls to sink or to bathroom due to low BP in standing on all occasionas.    Pertinent Vitals/ Pain       Pain Assessment: No/denies pain  Home Living                                          Prior Functioning/Environment              Frequency  Min 2X/week        Progress Toward Goals  OT Goals(current goals can now be found in the care plan section)  Progress towards OT goals: Progressing toward goals  Acute Rehab OT Goals Patient Stated Goal: to go home when she is feeling better OT Goal Formulation: With patient Time For Goal Achievement: 05/19/20 Potential to Achieve Goals: Good ADL Goals Pt Will Perform Lower Body Dressing: with modified independence;sit to/from stand Pt Will Transfer to Toilet: with modified independence;ambulating Additional ADL Goal #1: Pt will independently verbalize 3 strategeis to reduce risk of falls Additional ADL Goal #2: Pt will independently verbalize 3 energy conservation strategies  Plan Discharge plan remains appropriate    Co-evaluation                 AM-PAC OT "6 Clicks" Daily Activity     Outcome Measure   Help from another person eating meals?: None Help from another person taking care of personal grooming?: None Help from another person toileting, which includes using toliet, bedpan, or urinal?: A  Little Help from another person bathing (including washing, rinsing, drying)?: A Little Help from another person to put on and taking off regular upper body clothing?: None Help from another person to put on and taking off regular lower body clothing?: A Little 6 Click Score: 21    End of Session    OT Visit Diagnosis: Unsteadiness on feet (R26.81);Pain   Activity Tolerance  (limited by low BP)   Patient Left in chair;with call bell/phone within reach   Nurse Communication Mobility status;Other (comment) (orthostatics)        Time: 1610-9604 OT Time Calculation (min): 37 min  Charges: OT General Charges $OT Visit: 1 Visit OT Treatments $Self Care/Home Management : 23-37 mins   Glenford Peers 05/07/2020, 9:36 AM

## 2020-05-07 NOTE — Progress Notes (Addendum)
[facility]  Subjective  - She states the pain she was having when she presented is gone, now it's left scapular pain.  Over all she states she is feeling better since yesterday.   Objective 125/60 74 98.4 F (36.9 C) (Oral) 17 94%  Intake/Output Summary (Last 24 hours) at 05/07/2020 1008 Last data filed at 05/06/2020 1607 Gross per 24 hour  Intake 50 ml  Output 500 ml  Net -450 ml    Palpable Pedal pulses Abdomin soft  Pain surrounding left scapula, no CP Lungs non labored breathing O2 Sat 94% on RA   Assessment/Planning: Type B dissection  TM 102.2 currently 98.4 Acute hypoxic respiratory failure/bibasilar consolidations and effusions/hospital-acquired pneumonia: Procalcitonin unremarkable again today however patient had high-grade fever up to 102.2 last night and this raises suspicion for possible pneumonia. She does not have leukocytosis either. I will start her on Zosyn. Blood cultures have been drawn. UA is negative for any UTI.. WBC 6.2  She states the pain she was having when she presented is gone, now it's left scapular pain.  Over all she states she is feeling better since yesterday.  [name] 05/07/2020 10:08 AM --   BP control is better Still requiring some IV pain meds No abdominal pain 2+ DP pulses Home when off IV pain meds Follow up with me in 1 month Dr Stanford Breed on call this weekend if questions Otherwise I will see her Monday if she remains in hospital  [name], MD [facility] Office: [phone]  Laboratory Lab Results: Recent Labs    05/05/20 1000 05/06/20 0845  WBC 9.0 6.2  HGB 12.2 12.6  HCT 36.5 36.2  PLT 197 160   BMET Recent Labs    05/06/20 0845 05/07/20 0235  NA 131* 132*  K 3.6 3.5  CL 95* 94*  CO2 23 23  GLUCOSE 105* 98  BUN 13 17  CREATININE 0.71 0.79  CALCIUM 9.0 9.0    COAG Lab Results  Component Value Date   INR 0.96 01/05/2017    No results found for: PTT

## 2020-05-08 DIAGNOSIS — J189 Pneumonia, unspecified organism: Secondary | ICD-10-CM | POA: Diagnosis not present

## 2020-05-08 DIAGNOSIS — J9601 Acute respiratory failure with hypoxia: Secondary | ICD-10-CM | POA: Diagnosis not present

## 2020-05-08 DIAGNOSIS — I712 Thoracic aortic aneurysm, without rupture: Secondary | ICD-10-CM

## 2020-05-08 HISTORY — DX: Pneumonia, unspecified organism: J18.9

## 2020-05-08 HISTORY — DX: Acute respiratory failure with hypoxia: J96.01

## 2020-05-08 LAB — BASIC METABOLIC PANEL
Anion gap: 11 (ref 5–15)
Anion gap: 9 (ref 5–15)
BUN: 14 mg/dL (ref 8–23)
BUN: 15 mg/dL (ref 8–23)
CO2: 28 mmol/L (ref 22–32)
CO2: 29 mmol/L (ref 22–32)
Calcium: 8.9 mg/dL (ref 8.9–10.3)
Calcium: 9.2 mg/dL (ref 8.9–10.3)
Chloride: 94 mmol/L — ABNORMAL LOW (ref 98–111)
Chloride: 96 mmol/L — ABNORMAL LOW (ref 98–111)
Creatinine, Ser: 0.77 mg/dL (ref 0.44–1.00)
Creatinine, Ser: 0.93 mg/dL (ref 0.44–1.00)
GFR, Estimated: >60 mL/min (ref >60)
GFR, Estimated: >60 mL/min (ref >60)
Glucose, Bld: 108 mg/dL — ABNORMAL HIGH (ref 70–99)
Glucose, Bld: 109 mg/dL — ABNORMAL HIGH (ref 70–99)
Potassium: 2.7 mmol/L — CL (ref 3.5–5.1)
Potassium: 3.6 mmol/L (ref 3.5–5.1)
Sodium: 133 mmol/L — ABNORMAL LOW (ref 135–145)
Sodium: 134 mmol/L — ABNORMAL LOW (ref 135–145)

## 2020-05-08 LAB — MAGNESIUM
Magnesium: 1.9 mg/dL (ref 1.7–2.4)
Magnesium: 1.9 mg/dL (ref 1.7–2.4)

## 2020-05-08 MED ORDER — CEFDINIR 300 MG PO CAPS: 300.0000 mg | ORAL_CAPSULE | Freq: Two times a day (BID) | ORAL | 0 refills | Status: DC

## 2020-05-08 MED ORDER — POTASSIUM CHLORIDE CRYS ER 20 MEQ PO TBCR
40.0000 meq | EXTENDED_RELEASE_TABLET | Freq: Once | ORAL | Status: AC
  Administered 2020-05-08: 40 meq via ORAL
  Filled 2020-05-08: qty 2

## 2020-05-08 MED ORDER — CARVEDILOL 25 MG PO TABS: 25.0000 mg | ORAL_TABLET | Freq: Two times a day (BID) | ORAL | 0 refills | Status: DC

## 2020-05-08 MED ORDER — AMLODIPINE BESYLATE 5 MG PO TABS: 5.0000 mg | ORAL_TABLET | ORAL | 0 refills | Status: DC

## 2020-05-08 MED ORDER — POTASSIUM CHLORIDE 20 MEQ PO PACK
40.0000 meq | PACK | Freq: Once | ORAL | Status: AC
  Administered 2020-05-08: 40 meq via ORAL
  Filled 2020-05-08: qty 2

## 2020-05-08 MED ORDER — POTASSIUM CHLORIDE CRYS ER 20 MEQ PO TBCR: 40.0000 meq | EXTENDED_RELEASE_TABLET | ORAL | Status: DC

## 2020-05-08 MED ORDER — HYDROCHLOROTHIAZIDE 12.5 MG PO TABS: 25.0000 mg | ORAL_TABLET | Freq: Every day | ORAL | 0 refills | Status: DC

## 2020-05-08 NOTE — Discharge Summary (Signed)
Physician Discharge Summary  Angel Lane GMW:[id number] DOB: 1950/09/16 DOA: 05/01/2020  PCP: Virginia Crews, MD  Admit date: 05/01/2020 Discharge date: 05/08/2020 30 Day Unplanned Readmission Risk Score   Flowsheet Row ED to Hosp-Admission (Current) from 05/01/2020 in [facility] 4E CV SURGICAL PROGRESSIVE CARE  30 Day Unplanned Readmission Risk Score (%) 9.09 Filed at 05/08/2020 0800     This score is the patient's risk of an unplanned readmission within 30 days of being discharged (0 -100%). The score is based on dignosis, age, lab data, medications, orders, and past utilization.   Low:  0-14.9   Medium: 15-21.9   High: 22-29.9   Extreme: 30 and above         Admitted From: Home Disposition: Home  Recommendations for Outpatient Follow-up:  1. Follow up with PCP in 1-2 weeks 2. Follow-up with vascular surgery/Dr. Phebus in 4 weeks 3. Please obtain BMP/CBC in one week 4. Please follow up with your PCP on the following pending results: Unresulted Labs (From admission, onward)          Start     Ordered   05/08/20 4403  Basic metabolic panel  Once,   R       Question:  Specimen collection method  Answer:  Lab=Lab collect   05/08/20 0735   05/05/20 0837  CBC with Differential/Platelet  Once,   R       Question:  Specimen collection method  Answer:  Lab=Lab collect   05/05/20 0836            Home Health: Yes Equipment/Devices: None  Discharge Condition: Stable CODE STATUS: Full code Diet recommendation: Cardiac/low-sodium  Subjective: Seen and examined.  Feels much better.  No more chest pain or back pain or any shortness of breath or any fever.  Wants to go home.  Brief/Interim Summary: 70 year old female ongoing active smoker with 53 pack smoking history, sleep apnea on CPAP prior history of stroke with mild left-sided weakness in 2018, depression, anemia not otherwise specified. She is on statin and aspirin at home. Presented with several hours of upper abdominal  and back pain associated with chest pain. 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 Also has aberrant right subclavian artery. Started on esmolol and Cleviprex with improvement in blood pressure and symptoms. Vascular service consulted 05/01/2020 and did not discover any evidence of endorgan damage. Critical care medicine admitted 05/01/2020. Recommended blood pressure goal of systolic less than 474 and heart rate less than 60  Vascular services also recommended that theywould consider repair if she has progressive enlargement of her aorta or endorgan compromise. Most likely she would need right carotid subclavian bypass in addition to stent graft that she requires repair due to her aberrant right subclavian artery.  Patient was transferred under [facility] on 05/04/2020.  Repeat CT angiogram of chest and abdomen was done on 05/04/2020 which shows a stable aortic dissection type B.  Vascular surgery recommended repeating CT angiogram in 1 month and follow-up with them which is a scheduled for her already. She continued to have some chest pain and scapular pain.  Her blood pressure medications were being adjusted.  On top of Coreg 25 mg twice daily, her hydrochlorothiazide was increased to 25 mg and then amlodipine 5 mg was added.  In the interim, she developed high-grade fever up to 102.2 on the morning of 05/06/2020.  CT chest was concerning for possible atelectasis bilaterally.  She did not have any leukocytosis and procalcitonin was unremarkable however due to fever, HCAP was considered highly so she was started on Zosyn.  Since then, patient did not have any leukocytosis and her shortness of breath also improved and she was taken off of the oxygen and has been on room air for about 2 days now. She then had orthostatic hypotension yesterday early morning but since then her orthostatics  have been negative.  She was evaluated by PT OT who recommended home health PT.  Patient has improved significantly without any symptoms and she is very eager to go home.  Her blood pressure is also fairly under control.  Vascular surgery recommended to keep her blood pressure systolic less than 270 but should not go more than 130.  Heart rate less than 60.  Her aspirin is being discontinued at the time of discharge.  She will follow-up with vascular surgery.  She has been prescribed 5 more days of oral cefdinir.  Her potassium was 2.7 early this morning.  She has already received 80 mEq of potassium.  She will receive 1 more dose of 40 mEq before discharge today.  Discharge Diagnoses:  Active Problems:   Dissecting aneurysm of thoracic aorta, Stanford type B ([place])   HCAP (healthcare-associated pneumonia)   Acute respiratory failure with hypoxia [facility])    Discharge Instructions   Allergies as of 05/08/2020      Reactions   Penicillins Other (See Comments)   chills      Medication List    STOP taking these medications   aspirin EC 325 MG tablet   fluticasone 50 MCG/ACT nasal spray Commonly known as: FLONASE     TAKE these medications   amLODipine 5 MG tablet Commonly known as: NORVASC Take 1 tablet (5 mg total) by mouth daily.   carvedilol 25 MG tablet Commonly known as: COREG Take 1 tablet (25 mg total) by mouth 2 (two) times daily with a meal.   cefdinir 300 MG capsule Commonly known as: OMNICEF Take 1 capsule (300 mg total) by mouth 2 (two) times daily for 5 days.   hydrochlorothiazide 12.5 MG tablet Commonly known as: HYDRODIURIL Take 2 tablets (25 mg total) by mouth daily. What changed: how much to take   rosuvastatin 5 MG tablet Commonly known as: Crestor Take 1 tablet (5 mg total) by mouth daily.       Follow-up Information    Elam Dutch, MD Follow up in 1 month(s).   Specialties: Vascular Surgery, Cardiology Contact information: [address] [place] [address] [phone]        Virginia Crews, MD Follow up in 1 week(s).   Specialty: Family Medicine Contact information: [address] [place] [address] [phone]              Allergies  Allergen Reactions  . Penicillins Other (See Comments)    chills    Consultations: Vascular surgery  Procedures/Studies: DG Chest Portable 1 View  Result Date: 05/01/2020 CLINICAL DATA:  Chest pain. EXAM: PORTABLE CHEST 1 VIEW COMPARISON:  Chest x-ray [id number], CT chest 11/27/2017 FINDINGS: More prominent ascending aorta likely due to AP portable technique as well as patient rotation. More prominent cardiac silhouette likely due to AP portable technique. Otherwise the heart size and mediastinal contours are unchanged. Bilateral upper lobe cystic changes consistent with emphysema. Left base patchy airspace opacities. No pulmonary edema. No pleural effusion. No  pneumothorax. No acute osseous abnormality. IMPRESSION: Left base patchy airspace opacities could represent infection versus inflammation (aspiration pneumonia). Followup PA and lateral chest X-ray is recommended in 3-4 weeks following trial of antibiotic therapy to ensure resolution and exclude underlying malignancy. Electronically Signed   By: Iven Finn M.D.   On: 05/01/2020 16:36   ECHOCARDIOGRAM COMPLETE  Result Date: 05/02/2020    ECHOCARDIOGRAM REPORT   Patient Name:   JONEA BUKOWSKI Date of Exam: 05/02/2020 Medical Rec #:  [id number]        Height:       65.0 in Accession #:    [id number]       Weight:       193.1 lb Date of Birth:  [birth year]        BSA:          1.949 m Patient Age:    69 years         BP:           110/58 mmHg Patient Gender: F                HR:           79 bpm. Exam Location:  Inpatient Procedure: 2D Echo Indications:    aortic dissection  History:        Patient has prior history of Echocardiogram examinations, most                 recent 01/06/2017. Risk Factors:Sleep  Apnea, Current Smoker and                 Hypertension.  Sonographer:    Johny Chess Referring Phys: [place]  1. Left ventricular ejection fraction, by estimation, is 60 to 65%. The left ventricle has normal function. The left ventricle has no regional wall motion abnormalities. Left ventricular diastolic parameters are consistent with Grade I diastolic dysfunction (impaired relaxation). Elevated left atrial pressure.  2. Right ventricular systolic function is normal. The right ventricular size is normal.  3. The mitral valve is normal in structure. No evidence of mitral valve regurgitation. No evidence of mitral stenosis.  4. The aortic valve is tricuspid. Aortic valve regurgitation is moderate.  5. Descending aorta not well visualized.  6. The inferior vena cava is normal in size with greater than 50% respiratory variability, suggesting right atrial pressure of 3 mmHg. FINDINGS  Left Ventricle: Left ventricular ejection fraction, by estimation, is 60 to 65%. The left ventricle has normal function. The left ventricle has no regional wall motion abnormalities. The left ventricular internal cavity size was normal in size. There is  no left ventricular hypertrophy. Left ventricular diastolic parameters are consistent with Grade I diastolic dysfunction (impaired relaxation). Elevated left atrial pressure. Right Ventricle: The right ventricular size is normal. No increase in right ventricular wall thickness. Right ventricular systolic function is normal. Left Atrium: Left atrial size was normal in size. Right Atrium: Right atrial size was normal in size. Pericardium: There is no evidence of pericardial effusion. Mitral Valve: The mitral valve is normal in structure. No evidence of mitral valve regurgitation. No evidence of mitral valve stenosis. Tricuspid Valve: The tricuspid valve is normal in structure. Tricuspid valve regurgitation is not demonstrated. No evidence of tricuspid  stenosis. Aortic Valve: The aortic valve is tricuspid. Aortic valve regurgitation is moderate. Aortic regurgitation PHT measures 382 msec. Aortic valve mean gradient measures 4.8 mmHg. Aortic valve peak gradient measures 7.2 mmHg. Pulmonic Valve: The pulmonic valve  was not well visualized. Pulmonic valve regurgitation is not visualized. No evidence of pulmonic stenosis. Aorta: Descending aorta not well visualized. The aortic root is normal in size and structure. Pulmonary Artery: Indeterminant PASP, inadequate TR jet. Venous: The inferior vena cava is normal in size with greater than 50% respiratory variability, suggesting right atrial pressure of 3 mmHg. IAS/Shunts: No atrial level shunt detected by color flow Doppler.  LEFT VENTRICLE PLAX 2D LVIDd:         4.50 cm  Diastology LVIDs:         2.80 cm  LV e' medial:    6.31 cm/s LV PW:         0.90 cm  LV E/e' medial:  17.4 LV IVS:        1.00 cm  LV e' lateral:   7.72 cm/s LVOT diam:     2.00 cm  LV E/e' lateral: 14.2 LVOT Area:     3.14 cm  RIGHT VENTRICLE             IVC RV S prime:     14.00 cm/s  IVC diam: 1.60 cm TAPSE (M-mode): 2.1 cm LEFT ATRIUM             Index       RIGHT ATRIUM           Index LA diam:        3.60 cm 1.85 cm/m  RA Area:     12.40 cm LA Vol (A2C):   55.1 ml 28.27 ml/m RA Volume:   27.10 ml  13.91 ml/m LA Vol (A4C):   40.5 ml 20.78 ml/m LA Biplane Vol: 48.6 ml 24.94 ml/m  AORTIC VALVE AV Vmax:      134.38 cm/s AV Vmean:     107.879 cm/s AV VTI:       0.319 m AV Peak Grad: 7.2 mmHg AV Mean Grad: 4.8 mmHg AI PHT:       382 msec  AORTA Ao Root diam: 3.20 cm Ao Asc diam:  3.70 cm MITRAL VALVE MV Area (PHT): 4.89 cm     SHUNTS MV Decel Time: 155 msec     Systemic Diam: 2.00 cm MV E velocity: 110.00 cm/s MV A velocity: 128.00 cm/s MV E/A ratio:  0.86 Carlyle Dolly MD Electronically signed by Carlyle Dolly MD Signature Date/Time: 05/02/2020/4:10:25 PM    Final    CT Angio Chest/Abd/Pel for Dissection W and/or W/WO  Result Date:  05/04/2020 CLINICAL DATA:  Follow-up thoracic aortic dissection EXAM: CT ANGIOGRAPHY CHEST, ABDOMEN AND PELVIS TECHNIQUE: Non-contrast CT of the chest was initially obtained. Multidetector CT imaging through the chest, abdomen and pelvis was performed using the standard protocol during bolus administration of intravenous contrast. Multiplanar reconstructed images and MIPs were obtained and reviewed to evaluate the vascular anatomy. CONTRAST:  13mL OMNIPAQUE IOHEXOL 350 MG/ML SOLN COMPARISON:  05/01/2020, 11/27/2017 FINDINGS: CTA CHEST FINDINGS Cardiovascular: No significant cardiac enlargement is noted. The ascending aorta is within normal limits without evidence of aneurysmal dilatation or dissection. Variant anatomy is noted with an aberrant right subclavian artery identified arising just distal to the left subclavian artery. There is again noted a type B thoracic aortic dissection which appears to arise just beyond the origin of the left subclavian artery. This extends distally to the proximal abdominal aorta just above the origins of the renal arteries. The overall appearance is stable. The pulmonary artery as visualized is well opacified and shows no evidence of pulmonary emboli. Mediastinum/Nodes: Thoracic  inlet is within normal limits. No sizable hilar or mediastinal adenopathy is noted. The esophagus as visualized is within normal limits. Lungs/Pleura: Diffuse emphysematous changes are again identified and stable. Better visualized on today's study is a 7 mm nodule in the right upper lobe best seen on image number 28 of series 8. This is stable from a prior exam from 2019 and consistent with a benign etiology. Previously seen subpleural nodule in the lateral aspect of the left upper lobe is again noted but slightly less prominent than that seen on the prior exam. Scattered consolidation in the lower lobes is noted with associated small effusions. The effusions are new from the prior exam. Musculoskeletal: No  chest wall abnormality. No acute or significant osseous findings. Review of the MIP images confirms the above findings. CTA ABDOMEN AND PELVIS FINDINGS VASCULAR Aorta: Previously mentioned aortic dissection extends into the proximal abdominal aorta just above the level of the renal arteries stable from the prior exam. Or distal atherosclerotic calcifications are noted without aneurysmal dilatation. Celiac: Patent without evidence of aneurysm, dissection, vasculitis or significant stenosis. Arises from the true Angel. SMA: Patent without evidence of aneurysm, dissection, vasculitis or significant stenosis. Arises from the true Angel. Renals: Mild atherosclerotic changes are noted. Single renal arteries are seen bilaterally and widely patent. IMA: Patent without evidence of aneurysm, dissection, vasculitis or significant stenosis. Iliacs: Mild atherosclerotic changes are noted without aneurysmal dilatation or dissection. Veins: No specific venous abnormality is noted. Review of the MIP images confirms the above findings. NON-VASCULAR Hepatobiliary: Large hepatic cyst is again identified in the left lobe of the liver stable from the prior exam. This measures approximately 8.2 cm in greatest dimension. Gallbladder has been surgically removed. Pancreas: Unremarkable. No pancreatic ductal dilatation or surrounding inflammatory changes. Spleen: Normal in size without focal abnormality. Adrenals/Urinary Tract: Adrenal glands are within normal limits. Kidneys demonstrate a normal enhancement pattern bilaterally. No renal calculi or obstructive changes are noted. Bladder is partially distended. Stomach/Bowel: No obstructive or inflammatory changes of the colon are seen. The appendix is within normal limits. Small bowel and stomach are unremarkable. Lymphatic: No significant lymphadenopathy is noted. Reproductive: Uterus and bilateral adnexa are unremarkable. Other: No abdominal wall hernia or abnormality. No abdominopelvic  ascites. Musculoskeletal: Degenerative changes of lumbar spine are noted. Review of the MIP images confirms the above findings. IMPRESSION: Type B thoracic aortic dissection as described stable in appearance from the prior exam. Increasing consolidation and effusions in the bases bilaterally. Aberrant right subclavian artery. Parenchymal nodules in the upper lobe stable from 2019 consistent with a benign etiology. Chronic changes as described above. Aortic Atherosclerosis (ICD10-I70.0) and Emphysema (ICD10-J43.9). Electronically Signed   By: Inez Catalina M.D.   On: 05/04/2020 09:10   CT Angio Chest/Abd/Pel for Dissection W and/or Wo Contrast  Result Date: 05/01/2020 CLINICAL DATA:  Abdominal and back pain. EXAM: CT ANGIOGRAPHY CHEST, ABDOMEN AND PELVIS TECHNIQUE: Non-contrast CT of the chest was initially obtained. Multidetector CT imaging through the chest, abdomen and pelvis was performed using the standard protocol during bolus administration of intravenous contrast. Multiplanar reconstructed images and MIPs were obtained and reviewed to evaluate the vascular anatomy. CONTRAST:  133mL OMNIPAQUE IOHEXOL 350 MG/ML SOLN COMPARISON:  November 27, 2017. FINDINGS: CTA CHEST FINDINGS Cardiovascular: There is interval development of type B thoracic aortic dissection that begins distal to the origin of the left subclavian artery. This dissection extends through the descending thoracic aorta and into the proximal abdominal aorta. Aberrant right subclavian artery is noted  which is congenital anomaly. There is no significant stenosis involving the visualized great vessels. Atherosclerosis of thoracic aorta is noted. Normal cardiac size. No pericardial effusion. Mediastinum/Nodes: No enlarged mediastinal, hilar, or axillary lymph nodes. Thyroid gland, trachea, and esophagus demonstrate no significant findings. Lungs/Pleura: No pneumothorax or pleural effusion is noted. Emphysematous disease is noted bilaterally.  Bilateral posterior basilar subsegmental atelectasis is noted. Musculoskeletal: No chest wall abnormality. No acute or significant osseous findings. Review of the MIP images confirms the above findings. CTA ABDOMEN AND PELVIS FINDINGS VASCULAR Aorta: Previously described thoracic aortic dissection is seen extending into the suprarenal abdominal aorta. Atherosclerosis of abdominal aorta is noted. No aneurysm is noted. Celiac: Patent without evidence of aneurysm, dissection, vasculitis or significant stenosis. SMA: Patent without evidence of aneurysm, dissection, vasculitis or significant stenosis. Renals: Both renal arteries are patent without evidence of aneurysm, dissection, vasculitis, fibromuscular dysplasia or significant stenosis. IMA: Patent without evidence of aneurysm, dissection, vasculitis or significant stenosis. Inflow: Patent without evidence of aneurysm, dissection, vasculitis or significant stenosis. Veins: No obvious venous abnormality within the limitations of this arterial phase study. Review of the MIP images confirms the above findings. NON-VASCULAR Hepatobiliary: Status post cholecystectomy. Large left hepatic cyst is noted. No biliary dilatation is noted. Pancreas: Unremarkable. No pancreatic ductal dilatation or surrounding inflammatory changes. Spleen: Normal in size without focal abnormality. Adrenals/Urinary Tract: Adrenal glands are unremarkable. Kidneys are normal, without renal calculi, focal lesion, or hydronephrosis. Bladder is unremarkable. Stomach/Bowel: Stomach is within normal limits. Appendix appears normal. No evidence of bowel wall thickening, distention, or inflammatory changes. Lymphatic: No adenopathy is noted. Reproductive: Uterus and bilateral adnexa are unremarkable. Other: No abdominal wall hernia or abnormality. No abdominopelvic ascites. Musculoskeletal: No acute or significant osseous findings. Review of the MIP images confirms the above findings. IMPRESSION: 1.  Interval development of type B thoracic aortic dissection that begins distal to the origin of the left subclavian artery and extends through the descending thoracic aorta and into the proximal abdominal aorta. 2. 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 3. Large left hepatic cyst is noted. 4. Emphysema and aortic atherosclerosis. Aortic Atherosclerosis (ICD10-I70.0) and Emphysema (ICD10-J43.9). Electronically Signed   By: Marijo Conception M.D.   On: 05/01/2020 19:08      Discharge Exam: Vitals:   05/07/20 2350 05/08/20 0416  BP: 114/63 109/62  Pulse: 79 73  Resp: 20 (!) 21  Temp: 98.3 F (36.8 C) 98.5 F (36.9 C)  SpO2: 95% 94%   Vitals:   05/07/20 2017 05/07/20 2126 05/07/20 2350 05/08/20 0416  BP:  115/62 114/63 109/62  Pulse:   79 73  Resp:  18 20 (!) 21  Temp:   98.3 F (36.8 C) 98.5 F (36.9 C)  TempSrc:   Oral Oral  SpO2: 95%  95% 94%  Weight:      Height:        General: Pt is alert, awake, not in acute distress Cardiovascular: RRR, S1/S2 +, no rubs, no gallops Respiratory: CTA bilaterally, no wheezing, no rhonchi Abdominal: Soft, NT, ND, bowel sounds + Extremities: no edema, no cyanosis    The results of significant diagnostics from this hospitalization (including imaging, microbiology, ancillary and laboratory) are listed below for reference.     Microbiology: Recent Results (from the past 240 hour(s))  Resp Panel by RT-PCR (Flu A&B, Covid) Nasopharyngeal Swab     Status: None   Collection Time: 05/01/20  4:03 PM   Specimen: Nasopharyngeal Swab; Nasopharyngeal(NP) swabs in  vial transport medium  Result Value Ref Range Status   SARS Coronavirus 2 by RT PCR NEGATIVE NEGATIVE Final    Comment: (NOTE) SARS-CoV-2 target nucleic acids are NOT DETECTED.  The SARS-CoV-2 RNA is generally detectable in upper respiratory specimens during the acute phase of infection. The lowest concentration of SARS-CoV-2 viral copies this assay can  detect is 138 copies/mL. A negative result does not preclude SARS-Cov-2 infection and should not be used as the sole basis for treatment or other patient management decisions. A negative result may occur with  improper specimen collection/handling, submission of specimen other than nasopharyngeal swab, presence of viral mutation(s) within the areas targeted by this assay, and inadequate number of viral copies(<138 copies/mL). A negative result must be combined with clinical observations, patient history, and epidemiological information. The expected result is Negative.  Fact Sheet for Patients:  EntrepreneurPulse.com.au  Fact Sheet for Healthcare Providers:  IncredibleEmployment.be  This test is no t yet approved or cleared by the [place] FDA and  has been authorized for detection and/or diagnosis of SARS-CoV-2 by FDA under an Emergency Use Authorization (EUA). This EUA will remain  in effect (meaning this test can be used) for the duration of the COVID-19 declaration under Section 564(b)(1) of the Act, 21 U.S.C.section 360bbb-3(b)(1), unless the authorization is terminated  or revoked sooner.       Influenza A by PCR NEGATIVE NEGATIVE Final   Influenza B by PCR NEGATIVE NEGATIVE Final    Comment: (NOTE) The Xpert Xpress SARS-CoV-2/FLU/RSV plus assay is intended as an aid in the diagnosis of influenza from Nasopharyngeal swab specimens and should not be used as a sole basis for treatment. Nasal washings and aspirates are unacceptable for Xpert Xpress SARS-CoV-2/FLU/RSV testing.  Fact Sheet for Patients: EntrepreneurPulse.com.au  Fact Sheet for Healthcare Providers: IncredibleEmployment.be  This test is not yet approved or cleared by the [place] FDA and has been authorized for detection and/or diagnosis of SARS-CoV-2 by FDA under an Emergency Use Authorization (EUA). This EUA will remain in  effect (meaning this test can be used) for the duration of the COVID-19 declaration under Section 564(b)(1) of the Act, 21 U.S.C. section 360bbb-3(b)(1), unless the authorization is terminated or revoked.  Performed at [facility], [place] [address], [place], [place] [address]   MRSA PCR Screening     Status: None   Collection Time: 05/02/20 12:25 AM   Specimen: Nasopharyngeal  Result Value Ref Range Status   MRSA by PCR NEGATIVE NEGATIVE Final    Comment:        The GeneXpert MRSA Assay (FDA approved for NASAL specimens only), is one component of a comprehensive MRSA colonization surveillance program. It is not intended to diagnose MRSA infection nor to guide or monitor treatment for MRSA infections. Performed at [facility], [place] [address], [place], [place] [address]   Culture, blood (routine x 2)     Status: None (Preliminary result)   Collection Time: 05/06/20 12:49 AM   Specimen: BLOOD RIGHT HAND  Result Value Ref Range Status   Specimen Description BLOOD RIGHT HAND  Final   Special Requests   Final    BOTTLES DRAWN AEROBIC AND ANAEROBIC Blood Culture adequate volume   Culture   Final    NO GROWTH 2 DAYS Performed at [facility], [place] [address] [place], [place] [address]    Report Status PENDING  Incomplete  Culture, blood (routine x 2)     Status: None (Preliminary  result)   Collection Time: 05/06/20 12:49 AM   Specimen: BLOOD LEFT HAND  Result Value Ref Range Status   Specimen Description BLOOD LEFT HAND  Final   Special Requests   Final    BOTTLES DRAWN AEROBIC AND ANAEROBIC Blood Culture adequate volume   Culture   Final    NO GROWTH 2 DAYS Performed at [facility], [address] [place], [place] [address]    Report Status PENDING  Incomplete     Labs: BNP (last 3 results) No results for input(s): BNP in the last 8760 hours. Basic Metabolic Panel: Recent Labs  Lab 05/02/20 0349 05/04/20 0035 05/05/20 0146 05/06/20 0845  05/07/20 0235 05/08/20 0251 05/08/20 0500 05/08/20 0810  NA 136 132* 131* 131* 132* 133*  --   --   K 4.0 3.4* 3.2* 3.6 3.5 2.7*  --   --   CL 104 97* 93* 95* 94* 94*  --   --   CO2 23 26 24 23 23 28   --   --   GLUCOSE 148* 108* 93 105* 98 108*  --   --   BUN 14 16 12 13 17 14   --   --   CREATININE 0.69 0.73 0.70 0.71 0.79 0.77  --   --   CALCIUM 8.9 8.6* 8.8* 9.0 9.0 8.9  --   --   MG 1.9  --   --   --   --   --  1.9 1.9  PHOS 3.4  --   --   --   --   --   --   --    Liver Function Tests: Recent Labs  Lab 05/01/20 1610  AST 21  ALT 18  ALKPHOS 67  BILITOT 1.1  PROT 6.4*  ALBUMIN 3.4*   Recent Labs  Lab 05/01/20 1610  LIPASE 31   No results for input(s): AMMONIA in the last 168 hours. CBC: Recent Labs  Lab 05/01/20 1610 05/05/20 1000 05/06/20 0845  WBC 11.5* 9.0 6.2  NEUTROABS  --  6.6 4.3  HGB 12.9 12.2 12.6  HCT 40.5 36.5 36.2  MCV 94.0 90.8 88.9  PLT 179 197 160   Cardiac Enzymes: No results for input(s): CKTOTAL, CKMB, CKMBINDEX, TROPONINI in the last 168 hours. BNP: Invalid input(s): POCBNP CBG: Recent Labs  Lab 05/01/20 1619  GLUCAP 126*   D-Dimer No results for input(s): DDIMER in the last 72 hours. Hgb A1c No results for input(s): HGBA1C in the last 72 hours. Lipid Profile Recent Labs    05/06/20 0501  TRIG 62   Thyroid function studies No results for input(s): TSH, T4TOTAL, T3FREE, THYROIDAB in the last 72 hours.  Invalid input(s): FREET3 Anemia work up No results for input(s): VITAMINB12, FOLATE, FERRITIN, TIBC, IRON, RETICCTPCT in the last 72 hours. Urinalysis    Component Value Date/Time   COLORURINE AMBER (A) 05/06/2020 0022   APPEARANCEUR HAZY (A) 05/06/2020 0022   APPEARANCEUR Cloudy 10/29/2011 1553   LABSPEC 1.027 05/06/2020 0022   LABSPEC 1.027 10/29/2011 1553   PHURINE 6.0 05/06/2020 0022   GLUCOSEU NEGATIVE 05/06/2020 0022   GLUCOSEU Negative 10/29/2011 1553   HGBUR MODERATE (A) 05/06/2020 0022   BILIRUBINUR  NEGATIVE 05/06/2020 0022   BILIRUBINUR Negative 10/29/2011 1553   KETONESUR 80 (A) 05/06/2020 0022   PROTEINUR 30 (A) 05/06/2020 0022   NITRITE NEGATIVE 05/06/2020 0022   LEUKOCYTESUR NEGATIVE 05/06/2020 0022   LEUKOCYTESUR 2+ 10/29/2011 1553   Sepsis Labs Invalid input(s): PROCALCITONIN,  WBC,  LACTICIDVEN Microbiology Recent Results (from the past 240 hour(s))  Resp Panel by RT-PCR (Flu A&B, Covid) Nasopharyngeal Swab     Status: None   Collection Time: 05/01/20  4:03 PM   Specimen: Nasopharyngeal Swab; Nasopharyngeal(NP) swabs in vial transport medium  Result Value Ref Range Status   SARS Coronavirus 2 by RT PCR NEGATIVE NEGATIVE Final    Comment: (NOTE) SARS-CoV-2 target nucleic acids are NOT DETECTED.  The SARS-CoV-2 RNA is generally detectable in upper respiratory specimens during the acute phase of infection. The lowest concentration of SARS-CoV-2 viral copies this assay can detect is 138 copies/mL. A negative result does not preclude SARS-Cov-2 infection and should not be used as the sole basis for treatment or other patient management decisions. A negative result may occur with  improper specimen collection/handling, submission of specimen other than nasopharyngeal swab, presence of viral mutation(s) within the areas targeted by this assay, and inadequate number of viral copies(<138 copies/mL). A negative result must be combined with clinical observations, patient history, and epidemiological information. The expected result is Negative.  Fact Sheet for Patients:  EntrepreneurPulse.com.au  Fact Sheet for Healthcare Providers:  IncredibleEmployment.be  This test is no t yet approved or cleared by the [place] FDA and  has been authorized for detection and/or diagnosis of SARS-CoV-2 by FDA under an Emergency Use Authorization (EUA). This EUA will remain  in effect (meaning this test can be used) for the duration of the COVID-19  declaration under Section 564(b)(1) of the Act, 21 U.S.C.section 360bbb-3(b)(1), unless the authorization is terminated  or revoked sooner.       Influenza A by PCR NEGATIVE NEGATIVE Final   Influenza B by PCR NEGATIVE NEGATIVE Final    Comment: (NOTE) The Xpert Xpress SARS-CoV-2/FLU/RSV plus assay is intended as an aid in the diagnosis of influenza from Nasopharyngeal swab specimens and should not be used as a sole basis for treatment. Nasal washings and aspirates are unacceptable for Xpert Xpress SARS-CoV-2/FLU/RSV testing.  Fact Sheet for Patients: EntrepreneurPulse.com.au  Fact Sheet for Healthcare Providers: IncredibleEmployment.be  This test is not yet approved or cleared by the [place] FDA and has been authorized for detection and/or diagnosis of SARS-CoV-2 by FDA under an Emergency Use Authorization (EUA). This EUA will remain in effect (meaning this test can be used) for the duration of the COVID-19 declaration under Section 564(b)(1) of the Act, 21 U.S.C. section 360bbb-3(b)(1), unless the authorization is terminated or revoked.  Performed at [facility], [place] [address] [place], [place] [address]   MRSA PCR Screening     Status: None   Collection Time: 05/02/20 12:25 AM   Specimen: Nasopharyngeal  Result Value Ref Range Status   MRSA by PCR NEGATIVE NEGATIVE Final    Comment:        The GeneXpert MRSA Assay (FDA approved for NASAL specimens only), is one component of a comprehensive MRSA colonization surveillance program. It is not intended to diagnose MRSA infection nor to guide or monitor treatment for MRSA infections. Performed at [facility], [place] [address] [place], [place] [address]   Culture, blood (routine x 2)     Status: None (Preliminary result)   Collection Time: 05/06/20 12:49 AM   Specimen: BLOOD RIGHT HAND  Result Value Ref Range Status   Specimen Description BLOOD RIGHT HAND  Final    Special Requests   Final    BOTTLES DRAWN AEROBIC AND ANAEROBIC Blood Culture adequate volume   Culture  Final    NO GROWTH 2 DAYS Performed at [facility], [place] [address], [place], [place] [address]    Report Status PENDING  Incomplete  Culture, blood (routine x 2)     Status: None (Preliminary result)   Collection Time: 05/06/20 12:49 AM   Specimen: BLOOD LEFT HAND  Result Value Ref Range Status   Specimen Description BLOOD LEFT HAND  Final   Special Requests   Final    BOTTLES DRAWN AEROBIC AND ANAEROBIC Blood Culture adequate volume   Culture   Final    NO GROWTH 2 DAYS Performed at [facility], [place] [address] [place], [place] [address]    Report Status PENDING  Incomplete     Time coordinating discharge: Over 30 minutes  SIGNED:   Darliss Cheney, MD  [facility] 05/08/2020, 10:12 AM  If 7PM-7AM, please contact night-coverage [email or web address]

## 2020-05-08 NOTE — Progress Notes (Signed)
Date and time results received: 05/08/20 0355 (use smartphrase ".now" to insert current time)  Test: K  Critical Value: 2.7 Name of Provider Notified: Pahwani  Orders Received? Or Actions Taken?: Potassium ordered [address]

## 2020-05-08 NOTE — Discharge Instructions (Signed)
Aortic Dissection  Aortic dissection happens when there is a tear in the wall of the body's main blood vessel (aorta). The aorta leads out of the heart (ascending aorta), curves around, and then goes down the chest (descending aorta) and into the abdomen to supply arteries with blood. The wall of the aorta has inner and outer layers. As blood collects along the tear, one part of the aorta continues to carry blood to the body, but blood can also flow into the tear between the layers of the aorta. The torn part of the aorta fills with blood and swells. This can reduce blood flow through the part of the aorta that is still supplying blood to the body. Aortic dissection is a medical emergency. What are the causes? This condition is commonly caused by weakening of the artery wall due to high blood pressure. Other causes may include:  An injury, such as from a car crash.  A complication from heart surgery or from a diagnostic procedure called coronary catheterization.  Weakness of the artery wall due to birth defects that affect the connective tissues, such as Marfan syndrome. In some cases, the cause is not known. What increases the risk? The following factors may make you more likely to develop this condition:  Having certain medical conditions, such as: ? High blood pressure (hypertension). ? Hardening and narrowing of the arteries (atherosclerosis). ? A condition that causes inflammation of blood vessels, such as giant cell arteritis.  Having two cusps in the aortic valve instead of three (bicuspid aortic valve).  Having a bulge in the wall of the aorta (aortic aneurysm).  Being female.  Being pregnant.  Being older than age 60.  Using cocaine.  Smoking.  Lifting heavy weights or doing other types of strength training (high-intensity resistance training). What are the signs or symptoms? Signs and symptoms of aortic dissection start suddenly. The most common symptoms are:  Severe  chest pain that may feel like tearing, stabbing, or sharp pain.  Severe pain that spreads (radiates) to the back, neck, jaw, or abdomen.  Severe pain between the shoulder blades in the back. Other symptoms may include:  Trouble breathing.  Dizziness or fainting.  Sudden weakness on one side of the body.  Nausea or vomiting.  Trouble swallowing.  Coughing up blood.  Vomiting blood.  Clammy skin. How is this diagnosed? This condition may be diagnosed based on:  Your symptoms and a physical exam. This may include: ? Listening for abnormal blood flow sounds (murmurs) in your chest or abdomen. ? Checking your pulse in your arms and legs. ? Checking your blood pressure to see whether it is low, or whether there is a difference between the measurements (readings) from your right arm and left arm.  Electrocardiogram (ECG). This test measures the electrical activity in your heart.  Chest X-ray.  CT scan.  MRI.  Echocardiogram. This uses sound waves to make images of your heart.  Blood tests. How is this treated? It is important to treat aortic dissection as quickly as possible. Treatment may start as soon as your health care provider thinks that you have aortic dissection. Treatment depends on where the dissection is, how severe it is, and your overall health. Treatment may include:  Medicines to lower your heart rate and blood pressure.  Surgery to repair your aorta using artificial material (syntheticgraft).  A procedure to insert a stent-graft into the aorta (endovascular procedure). During this procedure: 1. A long, thin tube (stent) is inserted into   an artery near the groin (femoral artery). 2. The stent is moved up to the damaged part of the aorta. 3. The stent is opened to help improve blood flow and prevent future dissection. Your health care provider may refer you to a specialized treatment center. Follow these instructions at home: If you had surgery, follow  instructions from your health care provider about home care after the procedure. Activity  Do not lift anything that is heavier than 10 lb (4.5 kg), or the limit that you are told, until your health care provider says that it is safe.  Avoid activities that could injure your chest or abdomen. Ask your health care provider what activities are safe for you.  After you have recovered, try to stay active. Ask your health care provider what activities are safe for you after recovery.  Enroll in cardiac rehabilitation. This is a program that helps to improve your health and well-being. It includes exercise training, education, and counseling to help you recover. Lifestyle  Eat a heart-healthy diet, which includes lots of fresh fruits and vegetables, low-fat (lean) protein, and whole grains.  Work with your health care provider to treat any other conditions that you may have, such as obesity, high blood pressure, or diabetes.  Do not use any products that contain nicotine or tobacco, such as cigarettes, e-cigarettes, and chewing tobacco. If you need help quitting, ask your health care provider.      General instructions  Take over-the-counter and prescription medicines only as told by your health care provider.  Talk with your health care provider about how to manage stress.  Keep all follow-up visits as told by your health care provider. This is important. Get help right away if you:  Develop any symptoms of aortic dissection after treatment, including severe pain in your chest, back, or abdomen.  Have pain in your chest.  Have weakness in your arm or leg.  Have pain in your abdomen.  Have trouble breathing or you develop a cough.  Faint.  Develop a racing heartbeat (palpitations). These symptoms may represent a serious problem that is an emergency. Do not wait to see if the symptoms will go away. Get medical help right away. Call your local emergency services (911 in the [place].S.). Do  not drive yourself to the hospital. Summary  Aortic dissection happens when there is a tear in the wall of the body's main blood vessel (aorta). It is a medical emergency.  The most common symptom is severe pain in the chest or pain that spreads (radiates) to the back, neck, jaw, or abdomen.  It is important to treat aortic dissection as quickly as possible. Treatment usually includes medicines and surgery.  Take over-the-counter and prescription medicines only as told by your health care provider. This information is not intended to replace advice given to you by your health care provider. Make sure you discuss any questions you have with your health care provider. Document Revised: 08/29/2017 Document Reviewed: 08/02/2017 Elsevier Patient Education  2021 Elsevier Inc.  

## 2020-05-10 ENCOUNTER — Emergency Department
Admission: EM | Admit: 2020-05-10 | Discharge: 2020-05-10 | Payer: Federal, State, Local not specified - PPO | Attending: Emergency Medicine | Admitting: Emergency Medicine

## 2020-05-10 ENCOUNTER — Emergency Department: Payer: Federal, State, Local not specified - PPO

## 2020-05-10 ENCOUNTER — Inpatient Hospital Stay (HOSPITAL_COMMUNITY)
Admission: EM | Admit: 2020-05-10 | Discharge: 2020-05-21 | DRG: 253 | Disposition: A | Discharge status: Home / Self Care | Payer: Medicare Other | Attending: Vascular Surgery | Admitting: Vascular Surgery

## 2020-05-10 ENCOUNTER — Inpatient Hospital Stay
Admission: AD | Admit: 2020-05-10 | Payer: Federal, State, Local not specified - PPO | Source: Other Acute Inpatient Hospital | Admitting: Vascular Surgery

## 2020-05-10 ENCOUNTER — Encounter: Payer: Self-pay | Admitting: Emergency Medicine

## 2020-05-10 ENCOUNTER — Other Ambulatory Visit: Payer: Self-pay

## 2020-05-10 DIAGNOSIS — J9819 Other pulmonary collapse: Secondary | ICD-10-CM | POA: Diagnosis not present

## 2020-05-10 DIAGNOSIS — I10 Essential (primary) hypertension: Secondary | ICD-10-CM | POA: Insufficient documentation

## 2020-05-10 DIAGNOSIS — R531 Weakness: Secondary | ICD-10-CM | POA: Diagnosis present

## 2020-05-10 DIAGNOSIS — Z20822 Contact with and (suspected) exposure to covid-19: Secondary | ICD-10-CM | POA: Diagnosis not present

## 2020-05-10 DIAGNOSIS — E039 Hypothyroidism, unspecified: Secondary | ICD-10-CM | POA: Diagnosis present

## 2020-05-10 DIAGNOSIS — I2584 Coronary atherosclerosis due to calcified coronary lesion: Secondary | ICD-10-CM | POA: Diagnosis not present

## 2020-05-10 DIAGNOSIS — Z825 Family history of asthma and other chronic lower respiratory diseases: Secondary | ICD-10-CM | POA: Diagnosis not present

## 2020-05-10 DIAGNOSIS — F17210 Nicotine dependence, cigarettes, uncomplicated: Secondary | ICD-10-CM | POA: Insufficient documentation

## 2020-05-10 DIAGNOSIS — E269 Hyperaldosteronism, unspecified: Secondary | ICD-10-CM | POA: Diagnosis present

## 2020-05-10 DIAGNOSIS — I9581 Postprocedural hypotension: Secondary | ICD-10-CM | POA: Diagnosis not present

## 2020-05-10 DIAGNOSIS — E871 Hypo-osmolality and hyponatremia: Secondary | ICD-10-CM | POA: Diagnosis present

## 2020-05-10 DIAGNOSIS — G4730 Sleep apnea, unspecified: Secondary | ICD-10-CM | POA: Diagnosis present

## 2020-05-10 DIAGNOSIS — I7101 Dissection of thoracic aorta: Secondary | ICD-10-CM | POA: Insufficient documentation

## 2020-05-10 DIAGNOSIS — I493 Ventricular premature depolarization: Secondary | ICD-10-CM | POA: Diagnosis present

## 2020-05-10 DIAGNOSIS — I351 Nonrheumatic aortic (valve) insufficiency: Secondary | ICD-10-CM | POA: Diagnosis present

## 2020-05-10 DIAGNOSIS — Z880 Allergy status to penicillin: Secondary | ICD-10-CM

## 2020-05-10 DIAGNOSIS — E785 Hyperlipidemia, unspecified: Secondary | ICD-10-CM | POA: Diagnosis present

## 2020-05-10 DIAGNOSIS — I161 Hypertensive emergency: Secondary | ICD-10-CM | POA: Diagnosis not present

## 2020-05-10 DIAGNOSIS — I2510 Atherosclerotic heart disease of native coronary artery without angina pectoris: Secondary | ICD-10-CM

## 2020-05-10 DIAGNOSIS — I71019 Dissection of thoracic aorta, unspecified: Secondary | ICD-10-CM

## 2020-05-10 DIAGNOSIS — R1310 Dysphagia, unspecified: Secondary | ICD-10-CM | POA: Diagnosis present

## 2020-05-10 DIAGNOSIS — R0602 Shortness of breath: Secondary | ICD-10-CM

## 2020-05-10 DIAGNOSIS — I69354 Hemiplegia and hemiparesis following cerebral infarction affecting left non-dominant side: Secondary | ICD-10-CM

## 2020-05-10 DIAGNOSIS — Z8673 Personal history of transient ischemic attack (TIA), and cerebral infarction without residual deficits: Secondary | ICD-10-CM

## 2020-05-10 DIAGNOSIS — I7103 Dissection of thoracoabdominal aorta: Principal | ICD-10-CM

## 2020-05-10 DIAGNOSIS — I7102 Dissection of abdominal aorta: Secondary | ICD-10-CM | POA: Diagnosis not present

## 2020-05-10 DIAGNOSIS — E876 Hypokalemia: Secondary | ICD-10-CM | POA: Diagnosis present

## 2020-05-10 DIAGNOSIS — F32A Depression, unspecified: Secondary | ICD-10-CM | POA: Diagnosis present

## 2020-05-10 DIAGNOSIS — D62 Acute posthemorrhagic anemia: Secondary | ICD-10-CM | POA: Diagnosis not present

## 2020-05-10 DIAGNOSIS — E7800 Pure hypercholesterolemia, unspecified: Secondary | ICD-10-CM | POA: Diagnosis not present

## 2020-05-10 DIAGNOSIS — I69391 Dysphagia following cerebral infarction: Secondary | ICD-10-CM

## 2020-05-10 DIAGNOSIS — R0789 Other chest pain: Secondary | ICD-10-CM

## 2020-05-10 DIAGNOSIS — Z79899 Other long term (current) drug therapy: Secondary | ICD-10-CM | POA: Insufficient documentation

## 2020-05-10 DIAGNOSIS — Z8249 Family history of ischemic heart disease and other diseases of the circulatory system: Secondary | ICD-10-CM

## 2020-05-10 DIAGNOSIS — Z803 Family history of malignant neoplasm of breast: Secondary | ICD-10-CM

## 2020-05-10 DIAGNOSIS — Q278 Other specified congenital malformations of peripheral vascular system: Secondary | ICD-10-CM

## 2020-05-10 DIAGNOSIS — R0600 Dyspnea, unspecified: Secondary | ICD-10-CM

## 2020-05-10 DIAGNOSIS — R079 Chest pain, unspecified: Secondary | ICD-10-CM | POA: Diagnosis present

## 2020-05-10 DIAGNOSIS — K30 Functional dyspepsia: Secondary | ICD-10-CM | POA: Diagnosis not present

## 2020-05-10 DIAGNOSIS — Z9889 Other specified postprocedural states: Secondary | ICD-10-CM

## 2020-05-10 LAB — CBC WITH DIFFERENTIAL/PLATELET
Abs Immature Granulocytes: 0.06 10*3/uL (ref 0.00–0.07)
Basophils Absolute: 0 10*3/uL (ref 0.0–0.1)
Basophils Relative: 0 %
Eosinophils Absolute: 0.1 10*3/uL (ref 0.0–0.5)
Eosinophils Relative: 1 %
HCT: 37 % (ref 36.0–46.0)
Hemoglobin: 12.1 g/dL (ref 12.0–15.0)
Immature Granulocytes: 1 %
Lymphocytes Relative: 22 %
Lymphs Abs: 2.2 10*3/uL (ref 0.7–4.0)
MCH: 30 pg (ref 26.0–34.0)
MCHC: 32.7 g/dL (ref 30.0–36.0)
MCV: 91.6 fL (ref 80.0–100.0)
Monocytes Absolute: 0.9 10*3/uL (ref 0.1–1.0)
Monocytes Relative: 10 %
Neutro Abs: 6.6 10*3/uL (ref 1.7–7.7)
Neutrophils Relative %: 66 %
Platelets: 201 10*3/uL (ref 150–400)
RBC: 4.04 MIL/uL (ref 3.87–5.11)
RDW: 12.4 % (ref 11.5–15.5)
WBC: 9.8 10*3/uL (ref 4.0–10.5)
nRBC: 0 % (ref 0.0–0.2)

## 2020-05-10 LAB — RESP PANEL BY RT-PCR (FLU A&B, COVID) ARPGX2
Influenza A by PCR: NEGATIVE
Influenza B by PCR: NEGATIVE
SARS Coronavirus 2 by RT PCR: NEGATIVE

## 2020-05-10 LAB — COMPREHENSIVE METABOLIC PANEL
ALT: 19 U/L (ref 0–44)
AST: 21 U/L (ref 15–41)
Albumin: 3.1 g/dL — ABNORMAL LOW (ref 3.5–5.0)
Alkaline Phosphatase: 47 U/L (ref 38–126)
Anion gap: 11 (ref 5–15)
BUN: 15 mg/dL (ref 8–23)
CO2: 27 mmol/L (ref 22–32)
Calcium: 9.1 mg/dL (ref 8.9–10.3)
Chloride: 97 mmol/L — ABNORMAL LOW (ref 98–111)
Creatinine, Ser: 0.64 mg/dL (ref 0.44–1.00)
GFR, Estimated: >60 mL/min (ref >60)
Glucose, Bld: 121 mg/dL — ABNORMAL HIGH (ref 70–99)
Potassium: 3.2 mmol/L — ABNORMAL LOW (ref 3.5–5.1)
Sodium: 135 mmol/L (ref 135–145)
Total Bilirubin: 1 mg/dL (ref 0.3–1.2)
Total Protein: 7 g/dL (ref 6.5–8.1)

## 2020-05-10 LAB — TYPE AND SCREEN
ABO/RH(D): O POS
Antibody Screen: NEGATIVE

## 2020-05-10 LAB — TROPONIN I (HIGH SENSITIVITY)
Troponin I (High Sensitivity): 4 ng/L (ref <18)
Troponin I (High Sensitivity): 5 ng/L (ref <18)

## 2020-05-10 LAB — GLUCOSE, CAPILLARY: Glucose-Capillary: 120 mg/dL — ABNORMAL HIGH (ref 70–99)

## 2020-05-10 LAB — ABO/RH: ABO/RH(D): O POS

## 2020-05-10 LAB — MRSA PCR SCREENING: MRSA by PCR: NEGATIVE

## 2020-05-10 MED ORDER — DOCUSATE SODIUM 100 MG PO CAPS
100.0000 mg | ORAL_CAPSULE | Freq: Two times a day (BID) | ORAL | Status: DC
  Administered 2020-05-11 – 2020-05-20 (×15): 100 mg via ORAL
  Filled 2020-05-10 (×19): qty 1

## 2020-05-10 MED ORDER — IOHEXOL 350 MG/ML SOLN
100.0000 mL | Freq: Once | INTRAVENOUS | Status: AC | PRN
  Administered 2020-05-10: 100 mL via INTRAVENOUS

## 2020-05-10 MED ORDER — GUAIFENESIN-DM 100-10 MG/5ML PO SYRP: 15.0000 mL | ORAL_SOLUTION | ORAL | Status: DC | PRN

## 2020-05-10 MED ORDER — POTASSIUM CHLORIDE CRYS ER 20 MEQ PO TBCR
20.0000 meq | EXTENDED_RELEASE_TABLET | Freq: Once | ORAL | Status: AC
  Administered 2020-05-10: 40 meq via ORAL
  Filled 2020-05-10: qty 2

## 2020-05-10 MED ORDER — PANTOPRAZOLE SODIUM 40 MG PO TBEC
40.0000 mg | DELAYED_RELEASE_TABLET | Freq: Every day | ORAL | Status: DC
  Administered 2020-05-10 – 2020-05-21 (×12): 40 mg via ORAL
  Filled 2020-05-10 (×12): qty 1

## 2020-05-10 MED ORDER — FENTANYL CITRATE (PF) 100 MCG/2ML IJ SOLN
50.0000 ug | Freq: Once | INTRAMUSCULAR | Status: AC
  Administered 2020-05-10: 50 ug via INTRAVENOUS
  Filled 2020-05-10: qty 2

## 2020-05-10 MED ORDER — METOPROLOL TARTRATE 5 MG/5ML IV SOLN: 2.0000 mg | INTRAVENOUS | Status: DC | PRN

## 2020-05-10 MED ORDER — ALUM & MAG HYDROXIDE-SIMETH 200-200-20 MG/5ML PO SUSP
15.0000 mL | ORAL | Status: DC | PRN
  Administered 2020-05-19 – 2020-05-20 (×2): 30 mL via ORAL
  Filled 2020-05-10 (×3): qty 30

## 2020-05-10 MED ORDER — CHLORHEXIDINE GLUCONATE CLOTH 2 % EX PADS
6.0000 | MEDICATED_PAD | Freq: Every day | CUTANEOUS | Status: DC
  Administered 2020-05-10 – 2020-05-21 (×9): 6 via TOPICAL

## 2020-05-10 MED ORDER — MORPHINE SULFATE (PF) 2 MG/ML IV SOLN
2.0000 mg | INTRAVENOUS | Status: DC | PRN
  Administered 2020-05-10 – 2020-05-11 (×4): 2 mg via INTRAVENOUS
  Administered 2020-05-12: 4 mg via INTRAVENOUS
  Administered 2020-05-12 – 2020-05-14 (×6): 2 mg via INTRAVENOUS
  Filled 2020-05-10 (×11): qty 1
  Filled 2020-05-10: qty 2

## 2020-05-10 MED ORDER — AMLODIPINE BESYLATE 5 MG PO TABS
5.0000 mg | ORAL_TABLET | Freq: Once | ORAL | Status: AC
  Administered 2020-05-10: 5 mg via ORAL
  Filled 2020-05-10: qty 1

## 2020-05-10 MED ORDER — MORPHINE SULFATE (PF) 4 MG/ML IV SOLN
4.0000 mg | Freq: Once | INTRAVENOUS | Status: AC
  Administered 2020-05-10: 4 mg via INTRAVENOUS
  Filled 2020-05-10: qty 1

## 2020-05-10 MED ORDER — ACETAMINOPHEN 325 MG RE SUPP
325.0000 mg | RECTAL | Status: DC | PRN
  Filled 2020-05-10: qty 2

## 2020-05-10 MED ORDER — HYDRALAZINE HCL 20 MG/ML IJ SOLN
5.0000 mg | INTRAMUSCULAR | Status: DC | PRN
Start: 2020-05-10 — End: 2020-05-21

## 2020-05-10 MED ORDER — ONDANSETRON HCL 4 MG/2ML IJ SOLN
4.0000 mg | Freq: Once | INTRAMUSCULAR | Status: AC
  Administered 2020-05-10: 4 mg via INTRAVENOUS
  Filled 2020-05-10: qty 2

## 2020-05-10 MED ORDER — ESMOLOL HCL-SODIUM CHLORIDE 2000 MG/100ML IV SOLN
25.0000 ug/kg/min | INTRAVENOUS | Status: DC
  Administered 2020-05-10: 100 ug/kg/min via INTRAVENOUS
  Administered 2020-05-10: 25 ug/kg/min via INTRAVENOUS
  Administered 2020-05-11: 75 ug/kg/min via INTRAVENOUS
  Filled 2020-05-10 (×4): qty 100

## 2020-05-10 MED ORDER — LABETALOL HCL 5 MG/ML IV SOLN: 10.0000 mg | INTRAVENOUS | Status: DC | PRN

## 2020-05-10 MED ORDER — PHENOL 1.4 % MT LIQD: 1.0000 | OROMUCOSAL | Status: DC | PRN

## 2020-05-10 MED ORDER — DEXTROSE-NACL 5-0.45 % IV SOLN: INTRAVENOUS | Status: DC

## 2020-05-10 MED ORDER — ONDANSETRON HCL 4 MG/2ML IJ SOLN: 4.0000 mg | Freq: Four times a day (QID) | INTRAMUSCULAR | Status: DC | PRN

## 2020-05-10 MED ORDER — OXYCODONE HCL 5 MG PO TABS
5.0000 mg | ORAL_TABLET | ORAL | Status: DC | PRN
  Administered 2020-05-10 – 2020-05-17 (×20): 10 mg via ORAL
  Administered 2020-05-17 – 2020-05-18 (×5): 5 mg via ORAL
  Administered 2020-05-19 – 2020-05-20 (×3): 10 mg via ORAL
  Filled 2020-05-10 (×4): qty 2
  Filled 2020-05-10: qty 1
  Filled 2020-05-10 (×7): qty 2
  Filled 2020-05-10 (×2): qty 1
  Filled 2020-05-10 (×3): qty 2
  Filled 2020-05-10: qty 1
  Filled 2020-05-10 (×3): qty 2
  Filled 2020-05-10: qty 1
  Filled 2020-05-10 (×6): qty 2

## 2020-05-10 MED ORDER — CARVEDILOL 25 MG PO TABS
25.0000 mg | ORAL_TABLET | Freq: Once | ORAL | Status: AC
  Administered 2020-05-10: 25 mg via ORAL
  Filled 2020-05-10: qty 1

## 2020-05-10 MED ORDER — CLEVIDIPINE BUTYRATE 0.5 MG/ML IV EMUL
0.0000 mg/h | INTRAVENOUS | Status: DC
  Administered 2020-05-10: 1 mg/h via INTRAVENOUS
  Administered 2020-05-10 (×2): 21 mg/h via INTRAVENOUS
  Administered 2020-05-11: 20 mg/h via INTRAVENOUS
  Administered 2020-05-11: 14 mg/h via INTRAVENOUS
  Administered 2020-05-11: 4 mg/h via INTRAVENOUS
  Administered 2020-05-11: 21 mg/h via INTRAVENOUS
  Administered 2020-05-11: 20 mg/h via INTRAVENOUS
  Filled 2020-05-10 (×5): qty 100
  Filled 2020-05-10: qty 50
  Filled 2020-05-10: qty 100
  Filled 2020-05-10: qty 50
  Filled 2020-05-10: qty 100
  Filled 2020-05-10 (×2): qty 50
  Filled 2020-05-10: qty 100

## 2020-05-10 MED ORDER — ACETAMINOPHEN 325 MG PO TABS
325.0000 mg | ORAL_TABLET | ORAL | Status: DC | PRN
  Administered 2020-05-11 – 2020-05-13 (×2): 650 mg via ORAL
  Filled 2020-05-10 (×2): qty 2

## 2020-05-10 NOTE — Consult Note (Addendum)
Hospital Consult    Reason for Consult:  Type B aortic dissection Requesting Physician:  ED MRN #:  [id number]  History of Present Illness: This is a 70 y.o. 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  Patient states the pain in her upper abdomen and back has progressively worsened since Saturday evening and Sunday morning.  She presented to [facility] emergency department today for reevaluation.  Work-up included CTA chest abdomen pelvis with no significant interval change in dissection.  She denies any ischemic symptoms of bilateral lower extremities.  She states she was eating well over the weekend with no postprandial pain.  Currently she is on a Cleviprex infusion.  Past Medical History:  Diagnosis Date  . Anemia   . Depression   . Dyspnea    due to stroke  . Hypertension   . 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 Sleep apnea   . Stroke [facility])    November 2018     Past Surgical History:  Procedure Laterality Date  . ABDOMINAL SURGERY    . CARDIAC CATHETERIZATION    . CATARACT EXTRACTION W/PHACO Left 05/16/2017   Procedure: CATARACT EXTRACTION PHACO AND INTRAOCULAR LENS PLACEMENT ([place]) LEFT;  Surgeon: Leandrew Koyanagi, MD;  Location: [facility];  Service: Ophthalmology;  Laterality: Left;  . CATARACT EXTRACTION W/PHACO Right 06/13/2017   Procedure: CATARACT EXTRACTION PHACO AND INTRAOCULAR LENS PLACEMENT ([place]) RIGHT;  Surgeon: Leandrew Koyanagi, MD;  Location: [facility];  Service: Ophthalmology;  Laterality: Right;  sleep apnea  . CHOLECYSTECTOMY    . COLONOSCOPY N/A 01/21/2020   Procedure: COLONOSCOPY;  Surgeon: Toledo, Benay Pike, MD;  Location: [facility] ENDOSCOPY;  Service: Gastroenterology;  Laterality: N/A;    Allergies  Allergen Reactions  . Penicillins Other (See Comments)    chills    Prior to Admission medications    Medication Sig Start Date End Date Taking? Authorizing Provider  amLODipine (NORVASC) 5 MG tablet Take 1 tablet (5 mg total) by mouth daily. 05/08/20 06/07/20  Darliss Cheney, MD  carvedilol (COREG) 25 MG tablet Take 1 tablet (25 mg total) by mouth 2 (two) times daily with a meal. 05/08/20 06/07/20  Darliss Cheney, MD  cefdinir (OMNICEF) 300 MG capsule Take 1 capsule (300 mg total) by mouth 2 (two) times daily for 5 days. 05/08/20 05/13/20  Darliss Cheney, MD  hydrochlorothiazide (HYDRODIURIL) 12.5 MG tablet Take 2 tablets (25 mg total) by mouth daily. 05/08/20 06/07/20  Darliss Cheney, MD  rosuvastatin (CRESTOR) 5 MG tablet Take 1 tablet (5 mg total) by mouth daily. 02/19/20   Virginia Crews, MD    Social History   Socioeconomic History  . Marital status: Married    Spouse name: Not on file  . Number of children: Not on file  . Years of education: Not on file  . Highest education level: Not on file  Occupational History  . Not on file  Tobacco Use  . Smoking status: Current Every Day Smoker    Packs/day: 1.00    Years: 53.00    Pack years: 53.00    Types: Cigarettes  . Smokeless tobacco: Never Used  . Tobacco comment: Less than 1/2 PPD  Vaping Use  . Vaping Use: Former  Substance and Sexual Activity  . Alcohol use: No  . Drug use: No  . Sexual activity: Yes  Other Topics Concern  . Not on file  Social History Narrative  . Not on file   Social Determinants of Health   Financial Resource Strain: Not on file  Food Insecurity: Not on file  Transportation Needs: Not on file  Physical Activity: Not on file  Stress: Not on file  Social Connections: Not on file  Intimate Partner Violence: Not on file     Family History  Problem Relation Age of Onset  . Asthma Mother   . Heart failure Mother   . Hypertension Mother   . Heart attack Father   . Breast cancer Maternal Aunt   . Breast cancer Cousin        maternal 1st cousin    ROS: Otherwise negative unless mentioned in  HPI  Physical Examination  Vitals:   05/10/20 1600 05/10/20 1617  BP: 117/74 113/68  Pulse: 78 79  Resp: (!) 24 (!) 22  Temp:    SpO2: 91% 93%   There is no height or weight on file to calculate BMI.  General:  WDWN in NAD Gait: Not observed HENT: WNL, normocephalic Pulmonary: normal non-labored breathing Cardiac: regular Abdomen:  soft, NT/ND, tender only to deep palpation in upper abdomen Skin: without rashes Vascular Exam/Pulses: Symmetrical radial pulses; 2+ right DP pulse; 2+ left PT pulse Extremities: without ischemic changes, without Gangrene , without cellulitis; without open wounds;  Musculoskeletal: no muscle wasting or atrophy  Neurologic: A&O X 3;  No focal weakness or paresthesias are detected; speech is fluent/normal Psychiatric:  The pt has Normal affect. Lymph:  Unremarkable  CBC    Component Value Date/Time   WBC 9.8 05/10/2020 1001   RBC 4.04 05/10/2020 1001   HGB 12.1 05/10/2020 1001   HGB 13.4 02/19/2020 1604   HCT 37.0 05/10/2020 1001   HCT 40.8 02/19/2020 1604   PLT 201 05/10/2020 1001   PLT 200 02/19/2020 1604   MCV 91.6 05/10/2020 1001   MCV 91 02/19/2020 1604   MCV 90 10/29/2011 1553   MCH 30.0 05/10/2020 1001   MCHC 32.7 05/10/2020 1001   RDW 12.4 05/10/2020 1001   RDW 12.3 02/19/2020 1604   RDW 13.2 10/29/2011 1553   LYMPHSABS 2.2 05/10/2020 1001   MONOABS 0.9 05/10/2020 1001   EOSABS 0.1 05/10/2020 1001   BASOSABS 0.0 05/10/2020 1001    BMET    Component Value Date/Time   NA 135 05/10/2020 1001   NA 141 02/19/2020 1604   NA 141 10/29/2011 1553   K 3.2 (L) 05/10/2020 1001   K 3.3 (L) 10/29/2011 1553   CL 97 (L) 05/10/2020 1001   CL 108 (H) 10/29/2011 1553   CO2 27 05/10/2020 1001   CO2 26 10/29/2011 1553   GLUCOSE 121 (H) 05/10/2020 1001   GLUCOSE 104 (H) 10/29/2011 1553   BUN 15 05/10/2020 1001   BUN 17 02/19/2020 1604   BUN 18 10/29/2011 1553   CREATININE 0.64 05/10/2020 1001   CREATININE 0.87 10/29/2011 1553    CALCIUM 9.1 05/10/2020 1001   CALCIUM 9.1 10/29/2011 1553   GFRNONAA >60 05/10/2020 1001   GFRNONAA >60 10/29/2011 1553   GFRAA 78 02/19/2020 1604   GFRAA >60 10/29/2011 1553    COAGS: Lab Results  Component Value Date   INR 0.96 01/05/2017     Non-Invasive Vascular Imaging:   CTA suggesting slight progression of mural thrombus however no significant change    ASSESSMENT/PLAN: This is a 70 y.o. female with type B aortic dissection  -Despite no significant change in CTA chest, abdomen, pelvis patient  has worsening pain in the upper abdomen and back -She shows no signs of malperfusion with a benign abdominal exam and easily palpable pedal pulses -Agree with Cleviprex infusion and strict parameters to keep systolic blood pressure below 120 -On-call vascular surgeon Dr. Oneida Alar will evaluate the patient later today and provide further treatment plans   Dagoberto Ligas PA-C [facility] [phone]  Patient known to our service with type B aortic dissection.  She was discharged from the hospital approximately 48 hours ago.  After discharge she began having recurrent chest pain.  She currently describes the chest pain is between her shoulder blades with some radiation to the left side.  She does not really have abdominal pain.  She has no numbness or tingling in her feet.  I reviewed her CT angiogram of the chest abdomen and pelvis done at [facility] earlier today.  This shows subtle changes in the dissection but no significant growth in the aortic diameter and no progression of the dissection distally.  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  Troponin was negative.  On exam she has 2+ dorsalis pedis 2+ posterior tibial pulse right foot 2+ posterior tibial pulse left foot, 2+ radial pulses bilaterally  Neuro she is alert and oriented and moves her upper extremities and lower extremity  symmetrically with 5/5 motor strength.  Abdomen is soft and nontender  Will readmit for IV blood pressure control with a goal of systolic less than 950-932.  If the patient continues to have chest pain which is not controlled with medical management we may need to consider repair of her dissection in the next few days.  I discussed this with her but also discussed the risks of possible paraplegia or aortic rupture with operation.  Hopefully things will improve with medical management initially.  [name], MD [facility] Office: [phone]

## 2020-05-10 NOTE — ED Provider Notes (Signed)
[facility] Emergency Department Provider Note ____________________________________________   Event Date/Time   First MD Initiated Contact with Patient 05/10/20 [phone]     (approximate)  I have reviewed the triage vital signs and the nursing notes.  HISTORY  Chief Complaint No chief complaint on file.   HPI Angel Lane is a 70 y.o. femalewho presents to the ED for evaluation of chest and back pain.   Chart review indicates HTN and HLD. Left non-smoking history. Obesity, sleep apnea and CPAP usage. 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 Recently admitted 2/26-3/5 to [facility] for type B acute aortic dissection. Serial angiograms demonstrate no enlargement and surgery was not performed. Vascular surgery recommended a repeat angiogram in 1 month's time. She was treated for HCAP with Zosyn she was discharged with [facility] for 5 days.  Patient reports compliance with her Omnicef and denies any fevers or increased cough.  Denies shortness of breath.  She presents to the ED today for evaluation of worsening of left-sided chest and thoracic back pain.  Denies any trauma, syncope or falls.  She reports taking Tylenol, as directed at discharge, but despite this has had worsening pain.  Currently reporting 8/10 intensity aching left chest pain.  Past Medical History:  Diagnosis Date  . Anemia   . Depression   . Dyspnea    due to stroke  . Hypertension   . Neuromuscular disorder ([place])    left sided weakness from stroke  . Sleep apnea   . Stroke [facility])    November 2018     Patient Active Problem List   Diagnosis Date Noted  . HCAP (healthcare-associated pneumonia) 05/08/2020  . Acute respiratory failure with hypoxia ([place]) 05/08/2020  . 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 History of CVA (cerebrovascular accident) 02/19/2020  . OSA on CPAP 02/19/2020  . DOE (dyspnea on exertion) 02/19/2020  . Encounter for annual physical  exam 02/19/2020  . Breast cancer screening by mammogram 02/19/2020  . Palpitations 02/19/2020  . Left hip pain 02/19/2020  . Avitaminosis D 07/02/2019  . Trochanteric bursitis of both hips 07/02/2019  . Impacted cerumen of right ear 07/02/2019  . Pure hypercholesterolemia 10/08/2017  . Hyperglycemia 10/08/2017  . Insomnia 03/23/2017  . Tobacco abuse disorder 01/15/2017  . Left hemiparesis ([place]) 01/06/2017  . HTN (hypertension) 01/06/2017    Past Surgical History:  Procedure Laterality Date  . ABDOMINAL SURGERY    . CARDIAC CATHETERIZATION    . CATARACT EXTRACTION W/PHACO Left 05/16/2017   Procedure: CATARACT EXTRACTION PHACO AND INTRAOCULAR LENS PLACEMENT ([place]) LEFT;  Surgeon: Leandrew Koyanagi, MD;  Location: [place];  Service: Ophthalmology;  Laterality: Left;  . CATARACT EXTRACTION W/PHACO Right 06/13/2017   Procedure: CATARACT EXTRACTION PHACO AND INTRAOCULAR LENS PLACEMENT ([place]) RIGHT;  Surgeon: Leandrew Koyanagi, MD;  Location: [place];  Service: Ophthalmology;  Laterality: Right;  sleep apnea  . CHOLECYSTECTOMY    . COLONOSCOPY N/A 01/21/2020   Procedure: COLONOSCOPY;  Surgeon: Toledo, Benay Pike, MD;  Location: [facility];  Service: Gastroenterology;  Laterality: N/A;    Prior to Admission medications   Medication Sig Start Date End Date Taking? Authorizing Provider  amLODipine (NORVASC) 5 MG tablet Take 1 tablet (5 mg total) by mouth daily. 05/08/20 06/07/20  Darliss Cheney, MD  carvedilol (COREG) 25 MG tablet Take 1 tablet (25 mg total) by mouth 2 (two) times daily with a meal. 05/08/20 06/07/20  Darliss Cheney, MD  cefdinir (OMNICEF) 300  MG capsule Take 1 capsule (300 mg total) by mouth 2 (two) times daily for 5 days. 05/08/20 05/13/20  Darliss Cheney, MD  hydrochlorothiazide (HYDRODIURIL) 12.5 MG tablet Take 2 tablets (25 mg total) by mouth daily. 05/08/20 06/07/20  Darliss Cheney, MD  rosuvastatin (CRESTOR) 5 MG tablet Take 1 tablet (5 mg total) by mouth  daily. 02/19/20   Virginia Crews, MD    Allergies Penicillins  Family History  Problem Relation Age of Onset  . Asthma Mother   . Heart failure Mother   . Hypertension Mother   . Heart attack Father   . Breast cancer Maternal Aunt   . Breast cancer Cousin        maternal 1st cousin    Social History Social History   Tobacco Use  . Smoking status: Current Every Day Smoker    Packs/day: 1.00    Years: 53.00    Pack years: 53.00    Types: Cigarettes  . Smokeless tobacco: Never Used  . Tobacco comment: Less than 1/2 PPD  Vaping Use  . Vaping Use: Former  Substance Use Topics  . Alcohol use: No  . Drug use: No   Review of Systems  Constitutional: No fever/chills Eyes: No visual changes. ENT: No sore throat. Cardiovascular: Positive chest pain Respiratory: Denies shortness of breath. Gastrointestinal: No abdominal pain.  No nausea, no vomiting.  No diarrhea.  No constipation. Genitourinary: Negative for dysuria. Musculoskeletal: Positive for atraumatic thoracic back pain Skin: Negative for rash. Neurological: Negative for headaches, focal weakness or numbness. ____________________________________________   PHYSICAL EXAM:  VITAL SIGNS: Vitals:   05/10/20 1212 05/10/20 1301  BP: 112/74 115/74  Pulse: 75 88  Resp: 18 17  Temp:    SpO2: 98% 98%    Constitutional: Alert and oriented. Appears uncomfortable, but is conversational in full sentences. Eyes: Conjunctivae are normal. PERRL. EOMI. Head: Atraumatic. Nose: No congestion/rhinnorhea. Mouth/Throat: Mucous membranes are moist.  Oropharynx non-erythematous. Neck: No stridor. No cervical spine tenderness to palpation. Cardiovascular: Normal rate, regular rhythm. Grossly normal heart sounds.  Good peripheral circulation. Respiratory: Normal respiratory effort.  No retractions. Lungs CTAB. Gastrointestinal: Soft , nondistended, nontender to palpation. No CVA tenderness. Musculoskeletal: No lower  extremity tenderness nor edema.  No joint effusions. No signs of acute trauma. Neurologic:  Normal speech and language. No gross focal neurologic deficits are appreciated. No gait instability noted. Skin:  Skin is warm, dry and intact. No rash noted. Psychiatric: Mood and affect are normal. Speech and behavior are normal. ____________________________________________   LABS (all labs ordered are listed, but only abnormal results are displayed)  Labs Reviewed  COMPREHENSIVE METABOLIC PANEL - Abnormal; Notable for the following components:      Result Value   Potassium 3.2 (*)    Chloride 97 (*)    Glucose, Bld 121 (*)    Albumin 3.1 (*)    All other components within normal limits  RESP PANEL BY RT-PCR (FLU A&B, COVID) ARPGX2  CBC WITH DIFFERENTIAL/PLATELET  TROPONIN I (HIGH SENSITIVITY)  TROPONIN I (HIGH SENSITIVITY)   ____________________________________________  12 Lead EKG  Sinus rhythm, rate of 76 bpm. Normal axis and intervals. 1 PVC. No evidence of acute ischemia. ____________________________________________  RADIOLOGY  ED MD interpretation: CTA chest reviewed by me with similar type B dissection CXR reviewed by me without evidence of acute cardiopulmonary pathology.  CT CAP from 3/1 reviewed with stable dissection type B.  Official radiology report(s): DG Chest Portable 1 View  Result Date: 05/10/2020 CLINICAL  DATA:  Type B aortic dissection.  Follow-up infiltrate EXAM: PORTABLE CHEST 1 VIEW COMPARISON:  05/01/2020 FINDINGS: Mild left lower lobe airspace disease with slight progression. Progression of mild right lower lobe airspace disease Heart size and vascularity normal. Negative for heart failure. No significant effusion. IMPRESSION: Mild bibasilar airspace disease with progression since 05/01/2020. Atelectasis versus pneumonia Electronically Signed   By: Franchot Gallo M.D.   On: 05/10/2020 10:26   CT Angio Chest/Abd/Pel for Dissection W and/or Wo Contrast  Result  Date: 05/10/2020 CLINICAL DATA:  Chest and abdominal pain. EXAM: CT ANGIOGRAPHY CHEST, ABDOMEN AND PELVIS TECHNIQUE: Non-contrast CT of the chest was initially obtained. Multidetector CT imaging through the chest, abdomen and pelvis was performed using the standard protocol during bolus administration of intravenous contrast. Multiplanar reconstructed images and MIPs were obtained and reviewed to evaluate the vascular anatomy. CONTRAST:  162mL OMNIPAQUE IOHEXOL 350 MG/ML SOLN COMPARISON:  Prior study 05/04/2020 FINDINGS: CTA CHEST FINDINGS Cardiovascular: The heart is normal in size. No pericardial effusion. Normal caliber and appearance of the ascending thoracic aorta. Type B aortic dissection involving the descending thoracic aorta. Slight progression of mural thrombus. Maximum diameter of the upper descending thoracic aorta on image number 39/4 is 3.9 x 3.7 cm. This was previously approximately 3.8 x 3.4 cm. Mediastinum/Nodes: Stable scattered mediastinal and hilar lymph nodes. Lungs/Pleura: Stable emphysematous changes and areas of pulmonary scarring. Stable 7 mm right upper lobe pulmonary nodule. Stable small left pleural effusion and bibasilar atelectasis. Musculoskeletal: No significant bony findings. Review of the MIP images confirms the above findings. CTA ABDOMEN AND PELVIS FINDINGS VASCULAR Aorta: The thoracic aortic dissection continues into the upper abdominal AA or ending just above the celiac axis. Advanced atherosclerotic calcification involving the abdominal aorta but no aneurysm or dissection below the renal arteries. Celiac: Patent SMA: Patent Renals: Scattered calcifications but no aneurysm or is significant stenosis. IMA: Patent Inflow: Scattered calcifications but no aneurysm or dissection. Veins: Unremarkable. Review of the MIP images confirms the above findings. NON-VASCULAR Hepatobiliary: Numerous hepatic cysts are stable. Status post cholecystectomy. No common bile duct dilatation. Pancreas:  No mass, inflammation or ductal dilatation. Spleen: Normal size.  No focal lesions. Adrenals/Urinary Tract: Adrenal glands and kidneys are unremarkable. The bladder is unremarkable. Stomach/Bowel: The stomach, duodenum, small bowel and colon are grossly normal. The terminal ileum and appendix are normal. Lymphatic: No abdominal/pelvic lymphadenopathy. Reproductive: Uterine fibroids are noted. The ovaries are unremarkable. Other: No pelvic mass or adenopathy. No free pelvic fluid collections. No inguinal mass or adenopathy. No abdominal wall hernia or subcutaneous lesions. Musculoskeletal: No significant bony findings. Review of the MIP images confirms the above findings. IMPRESSION: 1. Type B aortic dissection. Slight progression of mural thrombus. No mediastinal hematoma. 2. Stable emphysematous changes and pulmonary scarring. 3. Stable 7 mm right upper lobe pulmonary nodule. 4. Stable small left pleural effusion and bibasilar atelectasis. 5. Stable numerous hepatic cysts. 6. No acute abdominal/pelvic findings, mass lesions or lymphadenopathy. 7. Emphysema and aortic atherosclerosis. Aortic Atherosclerosis (ICD10-I70.0) and Emphysema (ICD10-J43.9). Electronically Signed   By: Marijo Sanes M.D.   On: 05/10/2020 11:57    ____________________________________________   PROCEDURES and INTERVENTIONS  Procedure(s) performed (including Critical Care):  .1-3 Lead EKG Interpretation Performed by: Vladimir Crofts, MD Authorized by: Vladimir Crofts, MD     Interpretation: normal     ECG rate:  90   ECG rate assessment: normal     Rhythm: sinus rhythm     Ectopy: none     Conduction:  normal      Medications  morphine 4 MG/ML injection 4 mg (4 mg Intravenous Given 05/10/20 1011)  iohexol (OMNIPAQUE) 350 MG/ML injection 100 mL (100 mLs Intravenous Contrast Given 05/10/20 1109)  fentaNYL (SUBLIMAZE) injection 50 mcg (50 mcg Intravenous Given 05/10/20 1236)  amLODipine (NORVASC) tablet 5 mg (5 mg Oral Given 05/10/20  1333)  carvedilol (COREG) tablet 25 mg (25 mg Oral Given 05/10/20 1333)    ____________________________________________   MDM / ED COURSE   70 year old woman with history of hypertension presents to the ED with worsening left-sided chest and thoracic back pain, with evidence of slight extension/progression of her known type B aortic dissection, and requiring transfer to tertiary care institution. Presents minimally hypertensive that resolves with analgesia, vitals otherwise normal on room air. Exam demonstrates no neurovascular deficits, distress or signs of trauma. She appears uncomfortable prior to analgesia and in a nonspecific fashion. Blood work is unremarkable. EKG is nonischemic and troponins are without evidence of NSTEMI or ACS. CXR demonstrates no infiltrate or PTX. Repeat CTA performed demonstrates progression of her intramural hematoma. Due to this, I discussed the case with vascular surgery here and at [facility], and patient is excepted to the vascular surgery service at [facility] for transfer.    Clinical Course as of 05/10/20 1401  Mon May 10, 2020  1213 CT noted. Vascular paged [DS]  1215 Reassessed.  Patient was persistent pain.  Requesting analgesia. [DS]  1221 Call back from [place] with vascular surgery.  Dr. Lucky Cowboy was the middle of her procedure. Scrub nurse relays pertinent clinical information to Dr. Lucky Cowboy, who recommends transfer to [facility]. [DS]  1237 I speak with scrub RN at [facility].  Dr. Oneida Alar accepts as transfer.  [DS]  1302 Educated patient of my recommendation for transfer.  We discussed CTA results.  Educated husband, at the bedside, about these.  Answered questions. [DS]    Clinical Course User Index [DS] Vladimir Crofts, MD    ____________________________________________   FINAL CLINICAL IMPRESSION(S) / ED DIAGNOSES  Final diagnoses:  Dissection of thoracic aorta ([place])  Other chest pain     ED Discharge Orders    None       Gordie Belvin   Note:  This document was prepared using Dragon voice recognition software and may include unintentional dictation errors.   Vladimir Crofts, MD 05/10/20 [phone]

## 2020-05-10 NOTE — ED Notes (Signed)
Accepted by [facility]

## 2020-05-10 NOTE — ED Notes (Signed)
Called [facility] spoke [facility] for transfer [phone]

## 2020-05-10 NOTE — ED Triage Notes (Signed)
Recently admitted to [facility] for same symptoms, arrives today with c/o mid sternal chest pain with pain radiating to back.

## 2020-05-10 NOTE — ED Triage Notes (Signed)
Pt bib [facility] from [facility]. Pt has a HX of type B aortic disection that was found to be worsening. Pt pain has become increasingly worse since yesterday. At [facility] for vascular surgery.  Hx of hypertension, anemia, & stroke  Vitals: BP: 120/69 HR: NSR 80 O2: 98-100%

## 2020-05-10 NOTE — ED Provider Notes (Signed)
[facility] EMERGENCY DEPARTMENT Provider Note   CSN: [id number] Arrival date & time:        History No chief complaint on file.   Angel Lane is a 70 y.o. female presented for evaluation of chest abdominal pain.  Patient states for the past 2 days she has had chest and abdominal pain.  Pain is constant, improves slightly with pain medication but returns.  She has associated nausea.  Additional history obtained from chart review.  Reviewed CTA imaging from [facility], shows worsening type B aortic dissection.  Per notes, vascular surgery, Dr. Oneida Alar, was consulted and recommended transfer to [facility]. On evaluation, patient reports nausea and pain. She is not on blood thinners.   HPI     Past Medical History:  Diagnosis Date  . Anemia   . Depression   . Dyspnea    due to stroke  . Hypertension   . 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 Sleep apnea   . Stroke [facility])    November 2018     Patient Active Problem List   Diagnosis Date Noted  . Aortic dissection distal to left subclavian ([place]) 05/10/2020  . HCAP (healthcare-associated pneumonia) 05/08/2020  . Acute respiratory failure with hypoxia ([place]) 05/08/2020  . 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 History of CVA (cerebrovascular accident) 02/19/2020  . OSA on CPAP 02/19/2020  . DOE (dyspnea on exertion) 02/19/2020  . Encounter for annual physical exam 02/19/2020  . Breast cancer screening by mammogram 02/19/2020  . Palpitations 02/19/2020  . Left hip pain 02/19/2020  . Avitaminosis D 07/02/2019  . Trochanteric bursitis of both hips 07/02/2019  . Impacted cerumen of right ear 07/02/2019  . Pure hypercholesterolemia 10/08/2017  . Hyperglycemia 10/08/2017  . Insomnia 03/23/2017  . Tobacco abuse disorder 01/15/2017  . Left hemiparesis ([place]) 01/06/2017  . HTN (hypertension) 01/06/2017    Past Surgical History:  Procedure  Laterality Date  . ABDOMINAL SURGERY    . CARDIAC CATHETERIZATION    . CATARACT EXTRACTION W/PHACO Left 05/16/2017   Procedure: CATARACT EXTRACTION PHACO AND INTRAOCULAR LENS PLACEMENT ([place]) LEFT;  Surgeon: Leandrew Koyanagi, MD;  Location: [facility];  Service: Ophthalmology;  Laterality: Left;  . CATARACT EXTRACTION W/PHACO Right 06/13/2017   Procedure: CATARACT EXTRACTION PHACO AND INTRAOCULAR LENS PLACEMENT ([place]) RIGHT;  Surgeon: Leandrew Koyanagi, MD;  Location: [facility];  Service: Ophthalmology;  Laterality: Right;  sleep apnea  . CHOLECYSTECTOMY    . COLONOSCOPY N/A 01/21/2020   Procedure: COLONOSCOPY;  Surgeon: Toledo, Benay Pike, MD;  Location: [facility] ENDOSCOPY;  Service: Gastroenterology;  Laterality: N/A;     OB History   No obstetric history on file.     Family History  Problem Relation Age of Onset  . Asthma Mother   . Heart failure Mother   . Hypertension Mother   . Heart attack Father   . Breast cancer Maternal Aunt   . Breast cancer Cousin        maternal 1st cousin    Social History   Tobacco Use  . Smoking status: Current Every Day Smoker    Packs/day: 1.00    Years: 53.00    Pack years: 53.00    Types: Cigarettes  . Smokeless tobacco: Never Used  . Tobacco comment: Less than 1/2 PPD  Vaping Use  . Vaping Use: Former  Substance Use Topics  . Alcohol use: No  . Drug  use: No    Home Medications Prior to Admission medications   Medication Sig Start Date End Date Taking? Authorizing Provider  amLODipine (NORVASC) 5 MG tablet Take 1 tablet (5 mg total) by mouth daily. 05/08/20 06/07/20  Darliss Cheney, MD  carvedilol (COREG) 25 MG tablet Take 1 tablet (25 mg total) by mouth 2 (two) times daily with a meal. 05/08/20 06/07/20  Darliss Cheney, MD  cefdinir (OMNICEF) 300 MG capsule Take 1 capsule (300 mg total) by mouth 2 (two) times daily for 5 days. 05/08/20 05/13/20  Darliss Cheney, MD  hydrochlorothiazide (HYDRODIURIL) 12.5 MG tablet Take 2  tablets (25 mg total) by mouth daily. 05/08/20 06/07/20  Darliss Cheney, MD  rosuvastatin (CRESTOR) 5 MG tablet Take 1 tablet (5 mg total) by mouth daily. 02/19/20   Virginia Crews, MD    Allergies    Penicillins  Review of Systems   Review of Systems  Cardiovascular: Positive for chest pain.  Gastrointestinal: Positive for abdominal pain and nausea.  All other systems reviewed and are negative.   Physical Exam Updated Vital Signs BP 115/77   Pulse 81   Temp 99.7 F (37.6 C) (Oral)   Resp 19   SpO2 93%   Physical Exam Vitals and nursing note reviewed.  Constitutional:      General: She is not in acute distress.    Appearance: She is well-developed and well-nourished.     Comments: Appears uncomfortable due to pain.   HENT:     Head: Normocephalic and atraumatic.  Eyes:     Extraocular Movements: Extraocular movements intact and EOM normal.     Conjunctiva/sclera: Conjunctivae normal.     Pupils: Pupils are equal, round, and reactive to light.  Cardiovascular:     Rate and Rhythm: Normal rate and regular rhythm.     Pulses: Normal pulses and intact distal pulses.  Pulmonary:     Effort: Pulmonary effort is normal. No respiratory distress.     Breath sounds: Normal breath sounds. No wheezing.  Abdominal:     General: There is no distension.     Palpations: Abdomen is soft. There is no mass.     Tenderness: There is abdominal tenderness. There is no guarding or rebound.     Comments: TTP of upper abd. No pulsitile mass  Musculoskeletal:        General: Normal range of motion.     Cervical back: Normal range of motion and neck supple.  Skin:    General: Skin is warm and dry.  Neurological:     Mental Status: She is alert and oriented to person, place, and time.  Psychiatric:        Mood and Affect: Mood and affect normal.     ED Results / Procedures / Treatments   Labs (all labs ordered are listed, but only abnormal results are displayed) Labs Reviewed  CBC   COMPREHENSIVE METABOLIC PANEL  URINALYSIS, ROUTINE W REFLEX MICROSCOPIC  URINALYSIS, COMPLETE (UACMP) WITH MICROSCOPIC  TYPE AND SCREEN    EKG None  Radiology DG Chest Portable 1 View  Result Date: 05/10/2020 CLINICAL DATA:  Type B aortic dissection.  Follow-up infiltrate EXAM: PORTABLE CHEST 1 VIEW COMPARISON:  05/01/2020 FINDINGS: Mild left lower lobe airspace disease with slight progression. Progression of mild right lower lobe airspace disease Heart size and vascularity normal. Negative for heart failure. No significant effusion. IMPRESSION: Mild bibasilar airspace disease with progression since 05/01/2020. Atelectasis versus pneumonia Electronically Signed   By: Juanda Crumble  Carlis Abbott M.D.   On: 05/10/2020 10:26   CT Angio Chest/Abd/Pel for Dissection W and/or Wo Contrast  Result Date: 05/10/2020 CLINICAL DATA:  Chest and abdominal pain. EXAM: CT ANGIOGRAPHY CHEST, ABDOMEN AND PELVIS TECHNIQUE: Non-contrast CT of the chest was initially obtained. Multidetector CT imaging through the chest, abdomen and pelvis was performed using the standard protocol during bolus administration of intravenous contrast. Multiplanar reconstructed images and MIPs were obtained and reviewed to evaluate the vascular anatomy. CONTRAST:  135mL OMNIPAQUE IOHEXOL 350 MG/ML SOLN COMPARISON:  Prior study 05/04/2020 FINDINGS: CTA CHEST FINDINGS Cardiovascular: The heart is normal in size. No pericardial effusion. Normal caliber and appearance of the ascending thoracic aorta. Type B aortic dissection involving the descending thoracic aorta. Slight progression of mural thrombus. Maximum diameter of the upper descending thoracic aorta on image number 39/4 is 3.9 x 3.7 cm. This was previously approximately 3.8 x 3.4 cm. Mediastinum/Nodes: Stable scattered mediastinal and hilar lymph nodes. Lungs/Pleura: Stable emphysematous changes and areas of pulmonary scarring. Stable 7 mm right upper lobe pulmonary nodule. Stable small left pleural  effusion and bibasilar atelectasis. Musculoskeletal: No significant bony findings. Review of the MIP images confirms the above findings. CTA ABDOMEN AND PELVIS FINDINGS VASCULAR Aorta: The thoracic aortic dissection continues into the upper abdominal AA or ending just above the celiac axis. Advanced atherosclerotic calcification involving the abdominal aorta but no aneurysm or dissection below the renal arteries. Celiac: Patent SMA: Patent Renals: Scattered calcifications but no aneurysm or is significant stenosis. IMA: Patent Inflow: Scattered calcifications but no aneurysm or dissection. Veins: Unremarkable. Review of the MIP images confirms the above findings. NON-VASCULAR Hepatobiliary: Numerous hepatic cysts are stable. Status post cholecystectomy. No common bile duct dilatation. Pancreas: No mass, inflammation or ductal dilatation. Spleen: Normal size.  No focal lesions. Adrenals/Urinary Tract: Adrenal glands and kidneys are unremarkable. The bladder is unremarkable. Stomach/Bowel: The stomach, duodenum, small bowel and colon are grossly normal. The terminal ileum and appendix are normal. Lymphatic: No abdominal/pelvic lymphadenopathy. Reproductive: Uterine fibroids are noted. The ovaries are unremarkable. Other: No pelvic mass or adenopathy. No free pelvic fluid collections. No inguinal mass or adenopathy. No abdominal wall hernia or subcutaneous lesions. Musculoskeletal: No significant bony findings. Review of the MIP images confirms the above findings. IMPRESSION: 1. Type B aortic dissection. Slight progression of mural thrombus. No mediastinal hematoma. 2. Stable emphysematous changes and pulmonary scarring. 3. Stable 7 mm right upper lobe pulmonary nodule. 4. Stable small left pleural effusion and bibasilar atelectasis. 5. Stable numerous hepatic cysts. 6. No acute abdominal/pelvic findings, mass lesions or lymphadenopathy. 7. Emphysema and aortic atherosclerosis. Aortic Atherosclerosis (ICD10-I70.0) and  Emphysema (ICD10-J43.9). Electronically Signed   By: Marijo Sanes M.D.   On: 05/10/2020 11:57    Procedures .Critical Care Performed by: Franchot Heidelberg, PA-C Authorized by: Franchot Heidelberg, PA-C   Critical care provider statement:    Critical care time (minutes):  35   Critical care time was exclusive of:  Separately billable procedures and treating other patients and teaching time   Critical care was necessary to treat or prevent imminent or life-threatening deterioration of the following conditions:  Circulatory failure   Critical care was time spent personally by me on the following activities:  Blood draw for specimens, development of treatment plan with patient or surrogate, discussions with consultants, evaluation of patient's response to treatment, examination of patient, obtaining history from patient or surrogate, ordering and performing treatments and interventions, ordering and review of laboratory studies, ordering and review of radiographic studies,  pulse oximetry, re-evaluation of patient's condition and review of old charts   I assumed direction of critical care for this patient from another provider in my specialty: no     Care discussed with: admitting provider   Comments:     Pt with worsening type B dissection requiring BP control with drips, vascular surgery consultation, and admission      Medications Ordered in ED Medications  esmolol (BREVIBLOC) 2000 mg / 100 mL (20 mg/mL) infusion (75 mcg/kg/min  83.9 kg Intravenous Rate/Dose Change 05/10/20 1847)  clevidipine (CLEVIPREX) infusion 0.5 mg/mL (21 mg/hr Intravenous Infusion Verify 05/10/20 1744)  potassium chloride SA (KLOR-CON) CR tablet 20-40 mEq (has no administration in time range)  ondansetron (ZOFRAN) injection 4 mg (has no administration in time range)  alum & mag hydroxide-simeth (MAALOX/MYLANTA) 200-200-20 MG/5ML suspension 15-30 mL (has no administration in time range)  pantoprazole (PROTONIX) EC tablet 40  mg (has no administration in time range)  labetalol (NORMODYNE) injection 10 mg (has no administration in time range)  hydrALAZINE (APRESOLINE) injection 5 mg (has no administration in time range)  metoprolol tartrate (LOPRESSOR) injection 2-5 mg (has no administration in time range)  guaiFENesin-dextromethorphan (ROBITUSSIN DM) 100-10 MG/5ML syrup 15 mL (has no administration in time range)  phenol (CHLORASEPTIC) mouth spray 1 spray (has no administration in time range)  dextrose 5 %-0.45 % sodium chloride infusion (has no administration in time range)  acetaminophen (TYLENOL) tablet 325-650 mg (has no administration in time range)    Or  acetaminophen (TYLENOL) suppository 325-650 mg (has no administration in time range)  oxyCODONE (Oxy IR/ROXICODONE) immediate release tablet 5-10 mg (has no administration in time range)  morphine 2 MG/ML injection 2-5 mg (has no administration in time range)  docusate sodium (COLACE) capsule 100 mg (has no administration in time range)  fentaNYL (SUBLIMAZE) injection 50 mcg (50 mcg Intravenous Given 05/10/20 1533)  ondansetron (ZOFRAN) injection 4 mg (4 mg Intravenous Given 05/10/20 1533)  fentaNYL (SUBLIMAZE) injection 50 mcg (50 mcg Intravenous Given 05/10/20 1720)  fentaNYL (SUBLIMAZE) injection 50 mcg (50 mcg Intravenous Given 05/10/20 1845)    ED Course  I have reviewed the triage vital signs and the nursing notes.  Pertinent labs & imaging results that were available during my care of the patient were reviewed by me and considered in my medical decision making (see chart for details).    MDM Rules/Calculators/A&P                          Patient presenting from [facility] ED where she was found to have worsening/progression of known type B dissection.  On arrival, patient reports worsening pain and nausea.  She is normally hypertensive with a blood pressure of 142/80.  Will give nausea and pain control.  Will start on esmolol and Cleviprex.  Will consult  general surgery to inform her that patient is in the ED.  Patient has already had Covid test as well as basic labs and EKG.  Discussed with Dr. Artis Flock RN from vascular surgery. Dr. Oneida Alar is in the OR. Requests consultation with PA from vascular surgery  1536: discussed with Lennie Muckle, PA-C with vascular surgery. Pt to be evaluated.   Pt to be admitted by vascular surgery.   Final Clinical Impression(s) / ED Diagnoses Final diagnoses:  Dissection of thoracoabdominal aorta ([place])    Rx / DC Orders ED Discharge Orders    None       Makela Niehoff, PA-C  05/10/20 1931    Dorie Rank, MD 05/12/20 [phone]

## 2020-05-11 DIAGNOSIS — I7102 Dissection of abdominal aorta: Secondary | ICD-10-CM | POA: Diagnosis not present

## 2020-05-11 LAB — COMPREHENSIVE METABOLIC PANEL
ALT: 18 U/L (ref 0–44)
AST: 21 U/L (ref 15–41)
Albumin: 2.4 g/dL — ABNORMAL LOW (ref 3.5–5.0)
Alkaline Phosphatase: 43 U/L (ref 38–126)
Anion gap: 8 (ref 5–15)
BUN: 13 mg/dL (ref 8–23)
CO2: 24 mmol/L (ref 22–32)
Calcium: 8.5 mg/dL — ABNORMAL LOW (ref 8.9–10.3)
Chloride: 98 mmol/L (ref 98–111)
Creatinine, Ser: 0.75 mg/dL (ref 0.44–1.00)
GFR, Estimated: >60 mL/min (ref >60)
Glucose, Bld: 168 mg/dL — ABNORMAL HIGH (ref 70–99)
Potassium: 3.9 mmol/L (ref 3.5–5.1)
Sodium: 130 mmol/L — ABNORMAL LOW (ref 135–145)
Total Bilirubin: 1.1 mg/dL (ref 0.3–1.2)
Total Protein: 6 g/dL — ABNORMAL LOW (ref 6.5–8.1)

## 2020-05-11 LAB — CULTURE, BLOOD (ROUTINE X 2)
Culture: NO GROWTH
Culture: NO GROWTH
Special Requests: ADEQUATE
Special Requests: ADEQUATE

## 2020-05-11 LAB — CBC
HCT: 34.6 % — ABNORMAL LOW (ref 36.0–46.0)
Hemoglobin: 11.4 g/dL — ABNORMAL LOW (ref 12.0–15.0)
MCH: 30.7 pg (ref 26.0–34.0)
MCHC: 32.9 g/dL (ref 30.0–36.0)
MCV: 93.3 fL (ref 80.0–100.0)
Platelets: 213 10*3/uL (ref 150–400)
RBC: 3.71 MIL/uL — ABNORMAL LOW (ref 3.87–5.11)
RDW: 12.5 % (ref 11.5–15.5)
WBC: 9.7 10*3/uL (ref 4.0–10.5)
nRBC: 0 % (ref 0.0–0.2)

## 2020-05-11 MED ORDER — CARVEDILOL 25 MG PO TABS
25.0000 mg | ORAL_TABLET | Freq: Two times a day (BID) | ORAL | Status: DC
  Administered 2020-05-11 – 2020-05-15 (×8): 25 mg via ORAL
  Filled 2020-05-11 (×8): qty 1

## 2020-05-11 MED ORDER — CEFDINIR 300 MG PO CAPS
300.0000 mg | ORAL_CAPSULE | Freq: Two times a day (BID) | ORAL | Status: AC
  Administered 2020-05-11 – 2020-05-12 (×4): 300 mg via ORAL
  Filled 2020-05-11 (×4): qty 1

## 2020-05-11 MED ORDER — SODIUM CHLORIDE 0.9% FLUSH
10.0000 mL | Freq: Two times a day (BID) | INTRAVENOUS | Status: DC
  Administered 2020-05-11 – 2020-05-15 (×7): 10 mL

## 2020-05-11 MED ORDER — HYDROCHLOROTHIAZIDE 25 MG PO TABS
25.0000 mg | ORAL_TABLET | Freq: Every day | ORAL | Status: DC
  Administered 2020-05-11 – 2020-05-12 (×2): 25 mg via ORAL
  Filled 2020-05-11 (×2): qty 1

## 2020-05-11 MED ORDER — AMLODIPINE BESYLATE 5 MG PO TABS
5.0000 mg | ORAL_TABLET | ORAL | Status: DC
  Administered 2020-05-11 – 2020-05-14 (×3): 5 mg via ORAL
  Filled 2020-05-11 (×4): qty 1

## 2020-05-11 MED ORDER — ROSUVASTATIN CALCIUM 5 MG PO TABS
5.0000 mg | ORAL_TABLET | Freq: Every day | ORAL | Status: DC
Start: 2020-05-11 — End: 2020-05-19
  Administered 2020-05-11 – 2020-05-18 (×8): 5 mg via ORAL
  Filled 2020-05-11 (×8): qty 1

## 2020-05-11 MED ORDER — ORAL CARE MOUTH RINSE
15.0000 mL | Freq: Two times a day (BID) | OROMUCOSAL | Status: DC
  Administered 2020-05-11 – 2020-05-21 (×15): 15 mL via OROMUCOSAL

## 2020-05-11 MED ORDER — SODIUM CHLORIDE 0.9% FLUSH: 10.0000 mL | INTRAVENOUS | Status: DC | PRN

## 2020-05-11 NOTE — Progress Notes (Addendum)
Progress Note    05/11/2020 7:45 AM * No surgery found *  Subjective:  Pain in chest and back much better this morning   Vitals:   05/11/20 0645 05/11/20 0700  BP: 117/61 118/62  Pulse: 71 72  Resp: 15 14  Temp:    SpO2: 93% 93%   Physical Exam: Lungs:  Non labored Extremities:  Symmetrical radial pulses; 2+ R DP; 2+ L PT Abdomen:  Soft, NT, ND Neurologic: a&O  CBC    Component Value Date/Time   WBC 9.7 05/11/2020 0553   RBC 3.71 (L) 05/11/2020 0553   HGB 11.4 (L) 05/11/2020 0553   HGB 13.4 02/19/2020 1604   HCT 34.6 (L) 05/11/2020 0553   HCT 40.8 02/19/2020 1604   PLT 213 05/11/2020 0553   PLT 200 02/19/2020 1604   MCV 93.3 05/11/2020 0553   MCV 91 02/19/2020 1604   MCV 90 10/29/2011 1553   MCH 30.7 05/11/2020 0553   MCHC 32.9 05/11/2020 0553   RDW 12.5 05/11/2020 0553   RDW 12.3 02/19/2020 1604   RDW 13.2 10/29/2011 1553   LYMPHSABS 2.2 05/10/2020 1001   MONOABS 0.9 05/10/2020 1001   EOSABS 0.1 05/10/2020 1001   BASOSABS 0.0 05/10/2020 1001    BMET    Component Value Date/Time   NA 130 (L) 05/11/2020 0553   NA 141 02/19/2020 1604   NA 141 10/29/2011 1553   K 3.9 05/11/2020 0553   K 3.3 (L) 10/29/2011 1553   CL 98 05/11/2020 0553   CL 108 (H) 10/29/2011 1553   CO2 24 05/11/2020 0553   CO2 26 10/29/2011 1553   GLUCOSE 168 (H) 05/11/2020 0553   GLUCOSE 104 (H) 10/29/2011 1553   BUN 13 05/11/2020 0553   BUN 17 02/19/2020 1604   BUN 18 10/29/2011 1553   CREATININE 0.75 05/11/2020 0553   CREATININE 0.87 10/29/2011 1553   CALCIUM 8.5 (L) 05/11/2020 0553   CALCIUM 9.1 10/29/2011 1553   GFRNONAA >60 05/11/2020 0553   GFRNONAA >60 10/29/2011 1553   GFRAA 78 02/19/2020 1604   GFRAA >60 10/29/2011 1553    INR    Component Value Date/Time   INR 0.96 01/05/2017 2328     Intake/Output Summary (Last 24 hours) at 05/11/2020 0745 Last data filed at 05/11/2020 0737 Gross per 24 hour  Intake 1381.35 ml  Output 100 ml  Net 1281.35 ml      Assessment/Plan:  70 y.o. female with type B aortic dissection re-admitted with chest/back pain   Subjectively, discomfort is much more tolerable this morning No signs or symptoms of malperfusion on exam Continue cleviprex for strict BP control of systolic <423 PRN pain control Further treatment plans per Dr. Terri Skains, PA-C [facility] [phone] 05/11/2020 7:45 AM  Agree with above chest pain improved no endorgan compromise  Discussed with patient continued management with blood pressure control and pain management.  If no improvement then she is currently on the schedule for bilateral carotid subclavian bypass and thoracic stent graft to seal her dissection.  I discussed with her and her husband today the risk benefits and possible complications including but not limited to bleeding infection stroke aortic rupture possible paraplegia.  We will have ongoing conversations over the next few days depending on how her clinical course goes.  We will keep in ICU today.  Pain control blood pressure control.  If continued problems with oral medication with blood pressure control may involve cardiology to assist with her  hypertension management.  [name], MD [facility] [place] Office: [phone]

## 2020-05-12 ENCOUNTER — Telehealth: Payer: Self-pay

## 2020-05-12 ENCOUNTER — Encounter (HOSPITAL_COMMUNITY): Payer: Self-pay | Admitting: Vascular Surgery

## 2020-05-12 DIAGNOSIS — I7101 Dissection of thoracic aorta: Secondary | ICD-10-CM | POA: Diagnosis not present

## 2020-05-12 DIAGNOSIS — I2510 Atherosclerotic heart disease of native coronary artery without angina pectoris: Secondary | ICD-10-CM | POA: Diagnosis not present

## 2020-05-12 DIAGNOSIS — I2584 Coronary atherosclerosis due to calcified coronary lesion: Secondary | ICD-10-CM

## 2020-05-12 DIAGNOSIS — E7800 Pure hypercholesterolemia, unspecified: Secondary | ICD-10-CM

## 2020-05-12 DIAGNOSIS — I161 Hypertensive emergency: Secondary | ICD-10-CM

## 2020-05-12 DIAGNOSIS — I7102 Dissection of abdominal aorta: Secondary | ICD-10-CM | POA: Diagnosis not present

## 2020-05-12 LAB — CBC
HCT: 43.3 % (ref 36.0–46.0)
Hemoglobin: 14 g/dL (ref 12.0–15.0)
MCH: 29.9 pg (ref 26.0–34.0)
MCHC: 32.3 g/dL (ref 30.0–36.0)
MCV: 92.3 fL (ref 80.0–100.0)
Platelets: 134 10*3/uL — ABNORMAL LOW (ref 150–400)
RBC: 4.69 MIL/uL (ref 3.87–5.11)
RDW: 12.4 % (ref 11.5–15.5)
WBC: 5.1 10*3/uL (ref 4.0–10.5)
nRBC: 0 % (ref 0.0–0.2)

## 2020-05-12 LAB — BASIC METABOLIC PANEL
Anion gap: 9 (ref 5–15)
BUN: 8 mg/dL (ref 8–23)
CO2: 25 mmol/L (ref 22–32)
Calcium: 8.3 mg/dL — ABNORMAL LOW (ref 8.9–10.3)
Chloride: 95 mmol/L — ABNORMAL LOW (ref 98–111)
Creatinine, Ser: 0.62 mg/dL (ref 0.44–1.00)
GFR, Estimated: >60 mL/min (ref >60)
Glucose, Bld: 237 mg/dL — ABNORMAL HIGH (ref 70–99)
Potassium: 3 mmol/L — ABNORMAL LOW (ref 3.5–5.1)
Sodium: 129 mmol/L — ABNORMAL LOW (ref 135–145)

## 2020-05-12 MED ORDER — POTASSIUM CHLORIDE 10 MEQ/100ML IV SOLN
10.0000 meq | INTRAVENOUS | Status: AC
Start: 2020-05-12 — End: 2020-05-12
  Administered 2020-05-12 (×3): 10 meq via INTRAVENOUS
  Filled 2020-05-12 (×3): qty 100

## 2020-05-12 NOTE — Telephone Encounter (Signed)
Received call from [place] on [facility]  RN that patient would like her niece to have an update on her condition. Verbal confirmation from patient that release of information to niece Magda Paganini) is okay. Will send message/niece information to Dr. Oneida Alar.

## 2020-05-12 NOTE — Consult Note (Signed)
Cardiology Consultation:   Patient ID: Angel Lane MRN: [id number]; DOB: 11-27-1950  Admit date: 05/10/2020 Date of Consult: 05/12/2020  PCP:  Angel Crews, MD   [place]  Cardiologist:  Angel Lane (New to Angel Lane) Advanced Practice Provider:  No care team member to display Electrophysiologist:  None    Patient Profile:   Angel Lane is a 70 y.o. female with a hx of HTN, Stroke, Depression, anemia and tobacco use who is being seen today for the evaluation of HTN with type B aortic dissection at the request of Angel Lane.  History of Present Illness:   Angel Lane is a 70 yo female with PMH noted above. She has followed by Angel Lane at [facility].  She was recently seen in the office on 04/05/2020 with complaints of midsternal chest pain concerning for angina along with associated shortness of breath.  Recommendation was to undergo CT scan Myoview for further work-up.  Also was planned for outpatient echocardiogram to further assess sources of dyspnea.  She has been managed in regards to her hypertension with metoprolol 25 mg twice daily as well as HCTZ 12.5 mg daily.   She was recently admitted 2/26-3/5 with several hours of upper abdominal and back pain associated with chest pain.  She was found to be hypertensive with unequal radial pulses in the ED.  She was diagnosed with type B aortic dissection beginning at the distal to left subclavian artery and extending into the proximal abdominal aorta. She was treated with esmolol and Cleviprex with improvement in blood pressures.  Vascular surgery was consulted with recommendations to consider repair if she had progressive enlargement or signs of endorgan compromise.  It was felt she would likely need right carotid subclavian bypass in addition to stent graft.  Underwent repeat CT angiogram of chest and pelvis which showed stable dissection.  Vascular surgery recommended repeating CT  angiogram within a month and to follow-up as scheduled.  Her blood pressure medications were adjusted to include Coreg 25 mg twice daily, hydrochlorothiazide 25 mg daily and amlodipine 5 mg daily.  Hospital admission was complicated by development of fever and CT chest concerning for atelectasis.  Concern for HCAP and was placed on Zosyn.  Did become hypoxic requiring O2 but was able to wean to room air.  Had brief episode of orthostatic hypotension which prior to discharge.  She was evaluated by PT and OT who recommended home health PT.  Recommendation from vascular surgery to keep systolic BP less than 621 mmHg.  Her aspirin was stopped at the time of discharge.  She was prescribed an additional 5 days of cefdinir at discharge.  He presented back to the [place] [facility] ED on 3/7 with progressive upper abdominal and back pain. Was only home for one day before symptoms returned.  Work-up there included CT chest abdomen pelvis with no significant interval change in dissection.  She denied any numbness tingling, paralysis in lower extremities.  She was started on esmolol and Cleviprex and case discussed with Angel Lane.  She was transferred to [facility] for further management.  Initial plan was to attempt conservative management but patient continued to have chest pain.  She is now planned for bilateral carotid subclavian bypass with TEVAR on Friday.  In talking with patient she reports routinely checking her BPs at home, usually in the 308M systolic. As above was having some anginal symptoms back at her office visit with Angel Lane and set up for  outpatient lexiscan and echo but presented for admission. Her symptoms have improved significantly but continues to have chest pressure with radiation into the upper back and shoulder area. Has been transitioned from IV meds to Coreg 25mg  BID, norvasc 5mg  daily, and HCTZ 25mg  daily.   Past Medical History:  Diagnosis Date  . Anemia   . Depression   . Dyspnea     due to stroke  . Hypertension   . 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 Sleep apnea   . Stroke [facility])    November 2018     Past Surgical History:  Procedure Laterality Date  . ABDOMINAL SURGERY    . CARDIAC CATHETERIZATION    . CATARACT EXTRACTION W/PHACO Left 05/16/2017   Procedure: CATARACT EXTRACTION PHACO AND INTRAOCULAR LENS PLACEMENT ([place]) LEFT;  Surgeon: Leandrew Koyanagi, MD;  Location: [place];  Service: Ophthalmology;  Laterality: Left;  . CATARACT EXTRACTION W/PHACO Right 06/13/2017   Procedure: CATARACT EXTRACTION PHACO AND INTRAOCULAR LENS PLACEMENT ([place]) RIGHT;  Surgeon: Leandrew Koyanagi, MD;  Location: [place];  Service: Ophthalmology;  Laterality: Right;  sleep apnea  . CHOLECYSTECTOMY    . COLONOSCOPY N/A 01/21/2020   Procedure: COLONOSCOPY;  Surgeon: Toledo, Benay Pike, MD;  Location: [facility] ENDOSCOPY;  Service: Gastroenterology;  Laterality: N/A;     Home Medications:  Prior to Admission medications   Medication Sig Start Date End Date Taking? Authorizing Provider  acetaminophen (TYLENOL) 500 MG tablet Take 1,000 mg by mouth every 6 (six) hours as needed for mild pain.   Yes [provider]  amLODipine (NORVASC) 5 MG tablet Take 1 tablet (5 mg total) by mouth daily. 05/08/20 06/07/20 Yes Pahwani, Einar Grad, MD  carvedilol (COREG) 25 MG tablet Take 1 tablet (25 mg total) by mouth 2 (two) times daily with a meal. 05/08/20 06/07/20 Yes Pahwani, Einar Grad, MD  cefdinir (OMNICEF) 300 MG capsule Take 1 capsule (300 mg total) by mouth 2 (two) times daily for 5 days. 05/08/20 05/13/20 Yes Pahwani, Einar Grad, MD  hydrochlorothiazide (HYDRODIURIL) 12.5 MG tablet Take 2 tablets (25 mg total) by mouth daily. 05/08/20 06/07/20 Yes Pahwani, Einar Grad, MD  rosuvastatin (CRESTOR) 5 MG tablet Take 1 tablet (5 mg total) by mouth daily. 02/19/20  Yes Bacigalupo, [name], MD    Inpatient Medications: Scheduled Meds: . amLODipine  5 mg Oral Q24H  .  carvedilol  25 mg Oral BID WC  . cefdinir  300 mg Oral BID  . Chlorhexidine Gluconate Cloth  6 each Topical Daily  . docusate sodium  100 mg Oral BID  . hydrochlorothiazide  25 mg Oral Daily  . mouth rinse  15 mL Mouth Rinse BID  . pantoprazole  40 mg Oral Daily  . rosuvastatin  5 mg Oral Daily  . sodium chloride flush  10-40 mL Intracatheter Q12H   Continuous Infusions: . clevidipine Stopped (05/11/20 1900)  . esmolol Stopped (05/11/20 0417)   PRN Meds: acetaminophen **OR** acetaminophen, alum & mag hydroxide-simeth, guaiFENesin-dextromethorphan, hydrALAZINE, labetalol, metoprolol tartrate, morphine injection, ondansetron, oxyCODONE, phenol, sodium chloride flush  Allergies:    Allergies  Allergen Reactions  . Penicillins Other (See Comments)    chills    Social History:   Social History   Socioeconomic History  . Marital status: Married    Spouse name: Not on file  . Number of children: Not on file  . Years of education: Not on file  . Highest education level: Not on file  Occupational History  .  Not on file  Tobacco Use  . Smoking status: Current Every Day Smoker    Packs/day: 1.00    Years: 53.00    Pack years: 53.00    Types: Cigarettes  . Smokeless tobacco: Never Used  . Tobacco comment: Less than 1/2 PPD  Vaping Use  . Vaping Use: Former  Substance and Sexual Activity  . Alcohol use: No  . Drug use: No  . Sexual activity: Yes  Other Topics Concern  . Not on file  Social History Narrative  . Not on file   Social Determinants of Health   Financial Resource Strain: Not on file  Food Insecurity: Not on file  Transportation Needs: Not on file  Physical Activity: Not on file  Stress: Not on file  Social Connections: Not on file  Intimate Partner Violence: Not on file    Family History:    Family History  Problem Relation Age of Onset  . Asthma Mother   . Heart failure Mother   . Hypertension Mother   . Heart attack Father   . Breast cancer  Maternal Aunt   . Breast cancer Cousin        maternal 1st cousin     ROS:  Please see the history of present illness.   All other ROS reviewed and negative.     Physical Exam/Data:   Vitals:   05/12/20 1200 05/12/20 1300 05/12/20 1400 05/12/20 1543  BP: 100/65 (!) 103/57 (!) 111/55   Pulse: 72 71 72   Resp: (!) 21 19 (!) 25   Temp:    99.1 F (37.3 C)  TempSrc:    Oral  SpO2: 92% 95% 94%   Weight:        Intake/Output Summary (Last 24 hours) at 05/12/2020 1551 Last data filed at 05/12/2020 1200 Gross per 24 hour  Intake 2019.31 ml  Output 1075 ml  Net 944.31 ml   Last 3 Weights 05/11/2020 05/10/2020 05/07/2020  Weight (lbs) 198 lb 6.6 oz 185 lb 190 lb 4.1 oz  Weight (kg) 90 kg 83.915 kg 86.3 kg     Body mass index is 33.02 kg/m.  General:  Well nourished, well developed, in no acute distress HEENT: normal Lymph: no adenopathy Neck: no JVD Endocrine:  No thryomegaly Vascular: No carotid bruits Cardiac:  normal S1, S2; RRR; no murmur  Lungs:  clear to auscultation bilaterally, no wheezing, rhonchi or rales  Abd: soft, nontender, no hepatomegaly  Ext: no edema. 2+ DP pulses Musculoskeletal:  No deformities, BUE and BLE strength normal and equal Skin: warm and dry  Neuro:  CNs 2-12 intact, no focal abnormalities noted Psych:  Normal affect   EKG:  The EKG was personally reviewed and demonstrates:  NST 76 bpm, PVC  Relevant CV Studies:  Echo: 05/02/20  IMPRESSIONS    1. Left ventricular ejection fraction, by estimation, is 60 to 65%. The  left ventricle has normal function. The left ventricle has no regional  wall motion abnormalities. Left ventricular diastolic parameters are  consistent with Grade I diastolic  dysfunction (impaired relaxation). Elevated left atrial pressure.  2. Right ventricular systolic function is normal. The right ventricular  size is normal.  3. The mitral valve is normal in structure. No evidence of mitral valve  regurgitation. No  evidence of mitral stenosis.  4. The aortic valve is tricuspid. Aortic valve regurgitation is moderate.  5. Descending aorta not well visualized.  6. The inferior vena cava is normal in size with greater  than 50%  respiratory variability, suggesting right atrial pressure of 3 mmHg.   Laboratory Data:  High Sensitivity Troponin:   Recent Labs  Lab 05/01/20 1610 05/01/20 1801 05/10/20 1001 05/10/20 1230  TROPONINIHS 5 9 4 5      Chemistry Recent Labs  Lab 05/10/20 1001 05/11/20 0553 05/12/20 0351  NA 135 130* 129*  K 3.2* 3.9 3.0*  CL 97* 98 95*  CO2 27 24 25   GLUCOSE 121* 168* 237*  BUN 15 13 8   CREATININE 0.64 0.75 0.62  CALCIUM 9.1 8.5* 8.3*  GFRNONAA >60 >60 >60  ANIONGAP 11 8 9     Recent Labs  Lab 05/10/20 1001 05/11/20 0553  PROT 7.0 6.0*  ALBUMIN 3.1* 2.4*  AST 21 21  ALT 19 18  ALKPHOS 47 43  BILITOT 1.0 1.1   Hematology Recent Labs  Lab 05/10/20 1001 05/11/20 0553 05/12/20 0351  WBC 9.8 9.7 5.1  RBC 4.04 3.71* 4.69  HGB 12.1 11.4* 14.0  HCT 37.0 34.6* 43.3  MCV 91.6 93.3 92.3  MCH 30.0 30.7 29.9  MCHC 32.7 32.9 32.3  RDW 12.4 12.5 12.4  PLT 201 213 134*   BNPNo results for input(s): BNP, PROBNP in the last 168 hours.  DDimer No results for input(s): DDIMER in the last 168 hours.   Radiology/Studies:  DG Chest Portable 1 View  Result Date: 05/10/2020 CLINICAL DATA:  Type B aortic dissection.  Follow-up infiltrate EXAM: PORTABLE CHEST 1 VIEW COMPARISON:  05/01/2020 FINDINGS: Mild left lower lobe airspace disease with slight progression. Progression of mild right lower lobe airspace disease Heart size and vascularity normal. Negative for heart failure. No significant effusion. IMPRESSION: Mild bibasilar airspace disease with progression since 05/01/2020. Atelectasis versus pneumonia Electronically Signed   By: Franchot Gallo M.D.   On: 05/10/2020 10:26   CT Angio Chest/Abd/Pel for Dissection W and/or Wo Contrast  Result Date:  05/10/2020 CLINICAL DATA:  Chest and abdominal pain. EXAM: CT ANGIOGRAPHY CHEST, ABDOMEN AND PELVIS TECHNIQUE: Non-contrast CT of the chest was initially obtained. Multidetector CT imaging through the chest, abdomen and pelvis was performed using the standard protocol during bolus administration of intravenous contrast. Multiplanar reconstructed images and MIPs were obtained and reviewed to evaluate the vascular anatomy. CONTRAST:  164mL OMNIPAQUE IOHEXOL 350 MG/ML SOLN COMPARISON:  Prior study 05/04/2020 FINDINGS: CTA CHEST FINDINGS Cardiovascular: The heart is normal in size. No pericardial effusion. Normal caliber and appearance of the ascending thoracic aorta. Type B aortic dissection involving the descending thoracic aorta. Slight progression of mural thrombus. Maximum diameter of the upper descending thoracic aorta on image number 39/4 is 3.9 x 3.7 cm. This was previously approximately 3.8 x 3.4 cm. Mediastinum/Nodes: Stable scattered mediastinal and hilar lymph nodes. Lungs/Pleura: Stable emphysematous changes and areas of pulmonary scarring. Stable 7 mm right upper lobe pulmonary nodule. Stable small left pleural effusion and bibasilar atelectasis. Musculoskeletal: No significant bony findings. Review of the MIP images confirms the above findings. CTA ABDOMEN AND PELVIS FINDINGS VASCULAR Aorta: The thoracic aortic dissection continues into the upper abdominal AA or ending just above the celiac axis. Advanced atherosclerotic calcification involving the abdominal aorta but no aneurysm or dissection below the renal arteries. Celiac: Patent SMA: Patent Renals: Scattered calcifications but no aneurysm or is significant stenosis. IMA: Patent Inflow: Scattered calcifications but no aneurysm or dissection. Veins: Unremarkable. Review of the MIP images confirms the above findings. NON-VASCULAR Hepatobiliary: Numerous hepatic cysts are stable. Status post cholecystectomy. No common bile duct dilatation. Pancreas: No  mass,  inflammation or ductal dilatation. Spleen: Normal size.  No focal lesions. Adrenals/Urinary Tract: Adrenal glands and kidneys are unremarkable. The bladder is unremarkable. Stomach/Bowel: The stomach, duodenum, small bowel and colon are grossly normal. The terminal ileum and appendix are normal. Lymphatic: No abdominal/pelvic lymphadenopathy. Reproductive: Uterine fibroids are noted. The ovaries are unremarkable. Other: No pelvic mass or adenopathy. No free pelvic fluid collections. No inguinal mass or adenopathy. No abdominal wall hernia or subcutaneous lesions. Musculoskeletal: No significant bony findings. Review of the MIP images confirms the above findings. IMPRESSION: 1. Type B aortic dissection. Slight progression of mural thrombus. No mediastinal hematoma. 2. Stable emphysematous changes and pulmonary scarring. 3. Stable 7 mm right upper lobe pulmonary nodule. 4. Stable small left pleural effusion and bibasilar atelectasis. 5. Stable numerous hepatic cysts. 6. No acute abdominal/pelvic findings, mass lesions or lymphadenopathy. 7. Emphysema and aortic atherosclerosis. Aortic Atherosclerosis (ICD10-I70.0) and Emphysema (ICD10-J43.9). Electronically Signed   By: Marijo Sanes M.D.   On: 05/10/2020 11:57     Assessment and Plan:   LIA VIGILANTE is a 70 y.o. female with a hx of HTN, Stroke, Depression, anemia and tobacco use who is being seen today for the evaluation of HTN with type B aortic dissection at the request of Angel Lane.  1. Type B aortic dissection: noted from previous admission with plans to manage conservatively but presented back with recurrent chest pain. VVS managing with plans for bilateral carotid subclavian bypass with TEVAR on Friday  2. HTN: initially on esmolol and clevaprex which have been stopped. Now transitioned to Coreg 25mg  BID, amlodipine 5mg  daily and HCTZ 25mg  daily. Unclear cause of severe elevation in BP on initial admission as she reports BPs are normally in  the 373 systolic range at home. Likely related to pain? Also BP noted in the 120s on last office note by Angel Lane -- present the BP is well controlled now with pain management but she is hyponatremic and hypokalemic -- would continue with Coreg 25mg  BID along with amlodipine 5mg  daily -- suggest eventual transition to spiro from HCTZ with electrolyte abnormalities  3. Hyponatremia: Na+ 130>>129, suspect HCTZ is contributing as above -- transition to spiro prior to discharge  4. Hypokalemia: K+ 3.0, suppl  5. Tobacco use: cessation advised   Risk Assessment/Risk Scores:    For questions or updates, please contact [place] Please consult [email or web address] for contact info under    Signed, Reino Bellis, NP  05/12/2020 3:51 PM

## 2020-05-12 NOTE — Plan of Care (Signed)
  Problem: Education: Goal: Knowledge of General Education information will improve Description: Including pain rating scale, medication(s)/side effects and non-pharmacologic comfort measures Outcome: Progressing   Problem: Health Behavior/Discharge Planning: Goal: Ability to manage health-related needs will improve Outcome: Progressing   Problem: Clinical Measurements: Goal: Ability to maintain clinical measurements within normal limits will improve Outcome: Progressing Goal: Will remain free from infection Outcome: Progressing Goal: Diagnostic test results will improve Outcome: Progressing Goal: Respiratory complications will improve Outcome: Progressing Goal: Cardiovascular complication will be avoided Outcome: Progressing   Problem: Activity: Goal: Risk for activity intolerance will decrease Outcome: Progressing   Problem: Nutrition: Goal: Adequate nutrition will be maintained Outcome: Progressing   Problem: Elimination: Goal: Will not experience complications related to bowel motility Outcome: Progressing   Problem: Safety: Goal: Ability to remain free from injury will improve Outcome: Progressing   Problem: Skin Integrity: Goal: Risk for impaired skin integrity will decrease Outcome: Progressing   Problem: Coping: Goal: Level of anxiety will decrease Outcome: Not Progressing   Problem: Pain Managment: Goal: General experience of comfort will improve Outcome: Not Progressing

## 2020-05-12 NOTE — Progress Notes (Addendum)
[facility]  Subjective  - still has chest pain   Objective (!) 107/54 81 98.1 F (36.7 C) (Oral) 20 98%  Intake/Output Summary (Last 24 hours) at 05/12/2020 0840 Last data filed at 05/12/2020 0600 Gross per 24 hour  Intake 2068.02 ml  Output 575 ml  Net 1493.02 ml   Exam deferred pt having BM  Assessment/Planning: Type B dissection still with pain Plan for repair Friday. Bilateral carotid subclavian bypass and TEVAR  She can transfer to 4E if off IV BP meds and pain not worsening  I will call her sister today at pt request.  Hyponatremia stop hypotonic fluids  Will get cardiology to review BP med as I think we can get her heart rate lower with better beta blockade.  Spoke with Trish Dr Oval Linsey will see today  [name] 05/12/2020 8:40 AM --  Laboratory Lab Results: Recent Labs    05/11/20 0553 05/12/20 0351  WBC 9.7 5.1  HGB 11.4* 14.0  HCT 34.6* 43.3  PLT 213 134*   BMET Recent Labs    05/11/20 0553 05/12/20 0351  NA 130* 129*  K 3.9 3.0*  CL 98 95*  CO2 24 25  GLUCOSE 168* 237*  BUN 13 8  CREATININE 0.75 0.62  CALCIUM 8.5* 8.3*    COAG Lab Results  Component Value Date   INR 0.96 01/05/2017   No results found for: PTT

## 2020-05-13 DIAGNOSIS — I7102 Dissection of abdominal aorta: Secondary | ICD-10-CM | POA: Diagnosis not present

## 2020-05-13 DIAGNOSIS — I2510 Atherosclerotic heart disease of native coronary artery without angina pectoris: Secondary | ICD-10-CM | POA: Diagnosis not present

## 2020-05-13 DIAGNOSIS — I7101 Dissection of thoracic aorta: Secondary | ICD-10-CM | POA: Diagnosis not present

## 2020-05-13 DIAGNOSIS — I2584 Coronary atherosclerosis due to calcified coronary lesion: Secondary | ICD-10-CM | POA: Diagnosis not present

## 2020-05-13 LAB — BASIC METABOLIC PANEL
Anion gap: 10 (ref 5–15)
BUN: 8 mg/dL (ref 8–23)
CO2: 29 mmol/L (ref 22–32)
Calcium: 9 mg/dL (ref 8.9–10.3)
Chloride: 94 mmol/L — ABNORMAL LOW (ref 98–111)
Creatinine, Ser: 0.71 mg/dL (ref 0.44–1.00)
GFR, Estimated: >60 mL/min (ref >60)
Glucose, Bld: 103 mg/dL — ABNORMAL HIGH (ref 70–99)
Potassium: 3.4 mmol/L — ABNORMAL LOW (ref 3.5–5.1)
Sodium: 133 mmol/L — ABNORMAL LOW (ref 135–145)

## 2020-05-13 LAB — PREPARE RBC (CROSSMATCH)

## 2020-05-13 LAB — CBC
HCT: 32 % — ABNORMAL LOW (ref 36.0–46.0)
Hemoglobin: 10.8 g/dL — ABNORMAL LOW (ref 12.0–15.0)
MCH: 30.5 pg (ref 26.0–34.0)
MCHC: 33.8 g/dL (ref 30.0–36.0)
MCV: 90.4 fL (ref 80.0–100.0)
Platelets: 239 10*3/uL (ref 150–400)
RBC: 3.54 MIL/uL — ABNORMAL LOW (ref 3.87–5.11)
RDW: 12.3 % (ref 11.5–15.5)
WBC: 8.4 10*3/uL (ref 4.0–10.5)
nRBC: 0 % (ref 0.0–0.2)

## 2020-05-13 MED ORDER — HYDROCHLOROTHIAZIDE 25 MG PO TABS
12.5000 mg | ORAL_TABLET | Freq: Every day | ORAL | Status: DC
  Administered 2020-05-13 – 2020-05-14 (×2): 12.5 mg via ORAL
  Filled 2020-05-13 (×2): qty 1

## 2020-05-13 MED ORDER — POTASSIUM CHLORIDE CRYS ER 20 MEQ PO TBCR
40.0000 meq | EXTENDED_RELEASE_TABLET | Freq: Once | ORAL | Status: AC
  Administered 2020-05-13: 40 meq via ORAL
  Filled 2020-05-13: qty 2

## 2020-05-13 MED ORDER — SODIUM CHLORIDE 0.9% IV SOLUTION: Freq: Once | INTRAVENOUS | Status: DC

## 2020-05-13 NOTE — Progress Notes (Signed)
Progress Note  [name] Date of Encounter: 05/13/2020  [facility] Cardiologist: Skeet Latch, MD   Subjective   Nervous about surgery tomorrow.  Otherwise feeling well.  Complains of some chest discomfort and needs her next pain medication.  Inpatient Medications    Scheduled Meds: . amLODipine  5 mg Oral Q24H  . carvedilol  25 mg Oral BID WC  . Chlorhexidine Gluconate Cloth  6 each Topical Daily  . docusate sodium  100 mg Oral BID  . hydrochlorothiazide  12.5 mg Oral Daily  . mouth rinse  15 mL Mouth Rinse BID  . pantoprazole  40 mg Oral Daily  . rosuvastatin  5 mg Oral Daily  . sodium chloride flush  10-40 mL Intracatheter Q12H   Continuous Infusions: . clevidipine Stopped (05/11/20 1900)  . esmolol Stopped (05/11/20 0417)   PRN Meds: acetaminophen **OR** acetaminophen, alum & mag hydroxide-simeth, guaiFENesin-dextromethorphan, hydrALAZINE, labetalol, metoprolol tartrate, morphine injection, ondansetron, oxyCODONE, phenol, sodium chloride flush   Vital Signs    Vitals:   05/13/20 0730 05/13/20 0746 05/13/20 0800 05/13/20 0830  BP: 123/62  (!) 98/53 (!) 88/59  Pulse: 75  72 72  Resp: (!) 23  15 18   Temp:  98.8 F (37.1 C)    TempSrc:  Oral    SpO2: 92%  90% 92%  Weight:        Intake/Output Summary (Last 24 hours) at 05/13/2020 0941 Last data filed at 05/13/2020 0255 Gross per 24 hour  Intake 410.61 ml  Output 600 ml  Net -189.39 ml   Last 3 Weights 05/11/2020 05/10/2020 05/07/2020  Weight (lbs) 198 lb 6.6 oz 185 lb 190 lb 4.1 oz  Weight (kg) 90 kg 83.915 kg 86.3 kg      Telemetry    Sinus rhythm.  No events- Personally Reviewed  ECG    n/a - Personally Reviewed  Physical Exam   GEN: No acute distress.   Neck: No JVD Cardiac: RRR, no murmurs, rubs, or gallops.  Respiratory: Clear to auscultation bilaterally. GI: Soft, nontender, non-distended  MS: No edema; No deformity. Neuro:  Nonfocal  Psych: Normal affect   Labs     High Sensitivity Troponin:   Recent Labs  Lab 05/01/20 1610 05/01/20 1801 05/10/20 1001 05/10/20 1230  TROPONINIHS 5 9 4 5       Chemistry Recent Labs  Lab 05/10/20 1001 05/11/20 0553 05/12/20 0351 05/13/20 0454  NA 135 130* 129* 133*  K 3.2* 3.9 3.0* 3.4*  CL 97* 98 95* 94*  CO2 27 24 25 29   GLUCOSE 121* 168* 237* 103*  BUN 15 13 8 8   CREATININE 0.64 0.75 0.62 0.71  CALCIUM 9.1 8.5* 8.3* 9.0  PROT 7.0 6.0*  --   --   ALBUMIN 3.1* 2.4*  --   --   AST 21 21  --   --   ALT 19 18  --   --   ALKPHOS 47 43  --   --   BILITOT 1.0 1.1  --   --   GFRNONAA >60 >60 >60 >60  ANIONGAP 11 8 9 10      Hematology Recent Labs  Lab 05/11/20 0553 05/12/20 0351 05/13/20 0454  WBC 9.7 5.1 8.4  RBC 3.71* 4.69 3.54*  HGB 11.4* 14.0 10.8*  HCT 34.6* 43.3 32.0*  MCV 93.3 92.3 90.4  MCH 30.7 29.9 30.5  MCHC 32.9 32.3 33.8  RDW 12.5 12.4 12.3  PLT 213 134* 239  BNPNo results for input(s): BNP, PROBNP in the last 168 hours.   DDimer No results for input(s): DDIMER in the last 168 hours.   Radiology    No results found.  Cardiac Studies   Echo 05/02/20: 1. Left ventricular ejection fraction, by estimation, is 60 to 65%. The  left ventricle has normal function. The left ventricle has no regional  wall motion abnormalities. Left ventricular diastolic parameters are  consistent with Grade I diastolic  dysfunction (impaired relaxation). Elevated left atrial pressure.  2. Right ventricular systolic function is normal. The right ventricular  size is normal.  3. The mitral valve is normal in structure. No evidence of mitral valve  regurgitation. No evidence of mitral stenosis.  4. The aortic valve is tricuspid. Aortic valve regurgitation is moderate.  5. Descending aorta not well visualized.  6. The inferior vena cava is normal in size with greater than 50%  respiratory variability, suggesting right atrial pressure of 3 mmHg.   Patient Profile     [name] is  a 66F with coronary calcification, hypertension, prior stroke, tobacco abuse, and anemia with recent hospitalization for type B aortic dissection and hypertensive emergency   Assessment & Plan    # Hypertensive emergency:  # Type B aortic dissection:   Blood pressure has been low recently.  We will reduce HCTZ to 12.5 mg.  Prior to her last hospitalization she was on metoprolol and hydrochlorothiazide.  In the hospital she has had significant hypokalemia.  This raises the question of hyperaldosteronism.  However on review of her CT scan she has no adrenal adenomas.  We will check a.m. renin/aldosterone. n I do think that spironolactone will be more effective at maintaining her potassium and HCTZ.    Plan to switch HCTZ to spironolactone postoperatively.  Continue carvedilol and amlodipine.   Secondary Causes of Hypertension    Medications/Herbal: OCP, steroids, stimulants, antidepressants, weight loss medication, immune suppressants, NSAIDs, sympathomimetics, alcohol, caffeine, licorice, ginseng, [employer]. Ahl[employer] Wickes's wort, chemo (none identified)    Sleep Apnea: Resume CPAP  Renal artery stenosis: None on CT-A 05/2020  Hyperaldosteronism: No adenomas on CT 05/2020  Hyper/hypothyroidism: TSH normal 02/2020  Pheochromocytoma: (testing not indicated)   Cushing's syndrome: (testing not indicated)   Coarctation of the aorta: none on CT 05/2020    # Coronary calcification:   # Hyperlipidemia: Noted on CT.  Her chest pain is likely related to her Type B aortic dissection.  If it persists, would assess for ischemia post-procedure.  LDL was 105 02/2020.  Increased rosuvastatin to 20mg  and repeat lipids/CMP in 6-8 weeks.     # Tobacco abuse: Continue to encourage cessation.         For questions or updates, please contact [place] Please consult [email or web address] for contact info under        Signed, Skeet Latch, MD  05/13/2020, 9:41 AM

## 2020-05-13 NOTE — H&P (View-Only) (Signed)
Progress Note    05/13/2020 7:33 AM * No surgery date entered *  Subjective:  Persistent anterior chest through to back pain, Medicated with morphine at 0628. Attendant RN at bedside. BP better controlled now that pain is better controlled   Vitals:   05/13/20 0700 05/13/20 0730  BP: 120/60 123/62  Pulse: 72 75  Resp: (!) 26 (!) 23  Temp:    SpO2: 98% 92%    Physical Exam: General appearance: Awake, alert in no apparent distress Cardiac: Heart rate and rhythm are regular Respirations: Nonlabored Abd: soft, NT Extremities: Both feet are warm with intact sensation and motor function.  2+ right DP, left PT and bilateral radial artery pulses   CBC    Component Value Date/Time   WBC 8.4 05/13/2020 0454   RBC 3.54 (L) 05/13/2020 0454   HGB 10.8 (L) 05/13/2020 0454   HGB 13.4 02/19/2020 1604   HCT 32.0 (L) 05/13/2020 0454   HCT 40.8 02/19/2020 1604   PLT 239 05/13/2020 0454   PLT 200 02/19/2020 1604   MCV 90.4 05/13/2020 0454   MCV 91 02/19/2020 1604   MCV 90 10/29/2011 1553   MCH 30.5 05/13/2020 0454   MCHC 33.8 05/13/2020 0454   RDW 12.3 05/13/2020 0454   RDW 12.3 02/19/2020 1604   RDW 13.2 10/29/2011 1553   LYMPHSABS 2.2 05/10/2020 1001   MONOABS 0.9 05/10/2020 1001   EOSABS 0.1 05/10/2020 1001   BASOSABS 0.0 05/10/2020 1001    BMET    Component Value Date/Time   NA 133 (L) 05/13/2020 0454   NA 141 02/19/2020 1604   NA 141 10/29/2011 1553   K 3.4 (L) 05/13/2020 0454   K 3.3 (L) 10/29/2011 1553   CL 94 (L) 05/13/2020 0454   CL 108 (H) 10/29/2011 1553   CO2 29 05/13/2020 0454   CO2 26 10/29/2011 1553   GLUCOSE 103 (H) 05/13/2020 0454   GLUCOSE 104 (H) 10/29/2011 1553   BUN 8 05/13/2020 0454   BUN 17 02/19/2020 1604   BUN 18 10/29/2011 1553   CREATININE 0.71 05/13/2020 0454   CREATININE 0.87 10/29/2011 1553   CALCIUM 9.0 05/13/2020 0454   CALCIUM 9.1 10/29/2011 1553   GFRNONAA >60 05/13/2020 0454   GFRNONAA >60 10/29/2011 1553   GFRAA 78  02/19/2020 1604   GFRAA >60 10/29/2011 1553     Intake/Output Summary (Last 24 hours) at 05/13/2020 0733 Last data filed at 05/13/2020 0255 Gross per 24 hour  Intake 776 ml  Output 1100 ml  Net -324 ml    HOSPITAL MEDICATIONS Scheduled Meds: . amLODipine  5 mg Oral Q24H  . carvedilol  25 mg Oral BID WC  . Chlorhexidine Gluconate Cloth  6 each Topical Daily  . docusate sodium  100 mg Oral BID  . hydrochlorothiazide  25 mg Oral Daily  . mouth rinse  15 mL Mouth Rinse BID  . pantoprazole  40 mg Oral Daily  . rosuvastatin  5 mg Oral Daily  . sodium chloride flush  10-40 mL Intracatheter Q12H   Continuous Infusions: . clevidipine Stopped (05/11/20 1900)  . esmolol Stopped (05/11/20 0417)   PRN Meds:.acetaminophen **OR** acetaminophen, alum & mag hydroxide-simeth, guaiFENesin-dextromethorphan, hydrALAZINE, labetalol, metoprolol tartrate, morphine injection, ondansetron, oxyCODONE, phenol, sodium chloride flush  Assessment and Plan: Type B dissection still with pain. Good BP control. Plan for repair Friday. Bilateral carotid subclavian bypass and TEVAR. NPO/consent order placed. Cardiology has seen reviewed/updated recs: continue carvedilol and amlodipine and transition to HCTZ to  spironolactone 25mg  post-op.   -DVT prophylaxis:  SCDs   Risa Grill, PA-C [facility] [phone] 05/13/2020  7:33 AM   Agree with above. Operative repair d/w pt Called her sister niece and husband at her request.  Received voicemail and left our office number for all 3. Risk benefits possible complications discussed including but not limited to bleeding infection paraplegia stroke death.  Pt given drawings of planned repair  NPO p midnight  Will need spinal drain by anesthesia D/w pt that my partner Dr Stanford Breed will be acting as co Psychologist, sport and exercise to expedite procedure.  [name], MD [facility] [place] Office: [phone]  Addendum: spoke with  husband 1038 procedure discussed and questions answered  [name], MD [facility] [place] Office: [phone]

## 2020-05-13 NOTE — Progress Notes (Signed)
Mobility Specialist - Progress Note   05/13/20 1458  Mobility  Activity Ambulated in hall  Level of Assistance Standby assist, set-up cues, supervision of patient - no hands on  Assistive Device None  Distance Ambulated (ft) 160 ft (80 ft x 2)  Mobility Response Tolerated well  Mobility performed by Mobility specialist  $Mobility charge 1 Mobility   Pt required one seated rest break due to mid-back pain she rated an 8/10. Pt sitting up on edge of bed after walk. HR remained in 70s throughout.   Pricilla Handler Mobility Specialist Mobility Specialist Phone: [phone]

## 2020-05-13 NOTE — Progress Notes (Addendum)
Progress Note    05/13/2020 7:33 AM * No surgery date entered *  Subjective:  Persistent anterior chest through to back pain, Medicated with morphine at 0628. Attendant RN at bedside. BP better controlled now that pain is better controlled   Vitals:   05/13/20 0700 05/13/20 0730  BP: 120/60 123/62  Pulse: 72 75  Resp: (!) 26 (!) 23  Temp:    SpO2: 98% 92%    Physical Exam: General appearance: Awake, alert in no apparent distress Cardiac: Heart rate and rhythm are regular Respirations: Nonlabored Abd: soft, NT Extremities: Both feet are warm with intact sensation and motor function.  2+ right DP, left PT and bilateral radial artery pulses   CBC    Component Value Date/Time   WBC 8.4 05/13/2020 0454   RBC 3.54 (L) 05/13/2020 0454   HGB 10.8 (L) 05/13/2020 0454   HGB 13.4 02/19/2020 1604   HCT 32.0 (L) 05/13/2020 0454   HCT 40.8 02/19/2020 1604   PLT 239 05/13/2020 0454   PLT 200 02/19/2020 1604   MCV 90.4 05/13/2020 0454   MCV 91 02/19/2020 1604   MCV 90 10/29/2011 1553   MCH 30.5 05/13/2020 0454   MCHC 33.8 05/13/2020 0454   RDW 12.3 05/13/2020 0454   RDW 12.3 02/19/2020 1604   RDW 13.2 10/29/2011 1553   LYMPHSABS 2.2 05/10/2020 1001   MONOABS 0.9 05/10/2020 1001   EOSABS 0.1 05/10/2020 1001   BASOSABS 0.0 05/10/2020 1001    BMET    Component Value Date/Time   NA 133 (L) 05/13/2020 0454   NA 141 02/19/2020 1604   NA 141 10/29/2011 1553   K 3.4 (L) 05/13/2020 0454   K 3.3 (L) 10/29/2011 1553   CL 94 (L) 05/13/2020 0454   CL 108 (H) 10/29/2011 1553   CO2 29 05/13/2020 0454   CO2 26 10/29/2011 1553   GLUCOSE 103 (H) 05/13/2020 0454   GLUCOSE 104 (H) 10/29/2011 1553   BUN 8 05/13/2020 0454   BUN 17 02/19/2020 1604   BUN 18 10/29/2011 1553   CREATININE 0.71 05/13/2020 0454   CREATININE 0.87 10/29/2011 1553   CALCIUM 9.0 05/13/2020 0454   CALCIUM 9.1 10/29/2011 1553   GFRNONAA >60 05/13/2020 0454   GFRNONAA >60 10/29/2011 1553   GFRAA 78  02/19/2020 1604   GFRAA >60 10/29/2011 1553     Intake/Output Summary (Last 24 hours) at 05/13/2020 0733 Last data filed at 05/13/2020 0255 Gross per 24 hour  Intake 776 ml  Output 1100 ml  Net -324 ml    HOSPITAL MEDICATIONS Scheduled Meds: . amLODipine  5 mg Oral Q24H  . carvedilol  25 mg Oral BID WC  . Chlorhexidine Gluconate Cloth  6 each Topical Daily  . docusate sodium  100 mg Oral BID  . hydrochlorothiazide  25 mg Oral Daily  . mouth rinse  15 mL Mouth Rinse BID  . pantoprazole  40 mg Oral Daily  . rosuvastatin  5 mg Oral Daily  . sodium chloride flush  10-40 mL Intracatheter Q12H   Continuous Infusions: . clevidipine Stopped (05/11/20 1900)  . esmolol Stopped (05/11/20 0417)   PRN Meds:.acetaminophen **OR** acetaminophen, alum & mag hydroxide-simeth, guaiFENesin-dextromethorphan, hydrALAZINE, labetalol, metoprolol tartrate, morphine injection, ondansetron, oxyCODONE, phenol, sodium chloride flush  Assessment and Plan: Type B dissection still with pain. Good BP control. Plan for repair Friday. Bilateral carotid subclavian bypass and TEVAR. NPO/consent order placed. Cardiology has seen reviewed/updated recs: continue carvedilol and amlodipine and transition to HCTZ to  spironolactone 25mg  post-op.   -DVT prophylaxis:  SCDs   Risa Grill, PA-C [facility] [phone] 05/13/2020  7:33 AM   Agree with above. Operative repair d/w pt Called her sister niece and husband at her request.  Received voicemail and left our office number for all 3. Risk benefits possible complications discussed including but not limited to bleeding infection paraplegia stroke death.  Pt given drawings of planned repair  NPO p midnight  Will need spinal drain by anesthesia D/w pt that my partner Dr Stanford Breed will be acting as co Psychologist, sport and exercise to expedite procedure.  [name], MD [facility] [place] Office: [phone]  Addendum: spoke with  husband 1038 procedure discussed and questions answered  [name], MD [facility] [place] Office: [phone]

## 2020-05-13 NOTE — Progress Notes (Signed)
Pt transferred from ED to rm 14 via wheelchair.  Oriented to room and unit, bed in lowest locked position, call bell in reach.  VSS.  Orders released and reviewed.

## 2020-05-13 NOTE — Progress Notes (Signed)
   05/13/20 1209  Clinical Encounter Type  Visited With Patient and family together  Visit Type Initial  Referral From Nurse  Consult/Referral To Chaplain  Spiritual Encounters  Spiritual Needs Literature (HCPOA)  Nurse Malachy Mood called and asked if Chaplain Chalisa Kobler was available to come and assist with a AD for Mr. And Mrs. Steward.  Mrs. Steward is having surgery tomorrow morning and wanted to make sure she has her Primary and Secondary HCPOA in order. We were able to complete the AD and have it notarized.  Mr. And Mrs. Steward were so grateful.     Chaplain Fiana Gladu Morgan-Simpson [phone]

## 2020-05-13 NOTE — Progress Notes (Signed)
Patient transferred to 4E14 on tele with belongings.   Arnell Sieving, patient's husband, came to [id number].

## 2020-05-14 ENCOUNTER — Inpatient Hospital Stay (HOSPITAL_COMMUNITY): Payer: Medicare Other

## 2020-05-14 ENCOUNTER — Inpatient Hospital Stay (HOSPITAL_COMMUNITY): Payer: Medicare Other | Admitting: Certified Registered Nurse Anesthetist

## 2020-05-14 ENCOUNTER — Encounter (HOSPITAL_COMMUNITY)
Admission: EM | Disposition: A | Discharge status: Home / Self Care | Payer: Self-pay | Source: Home / Self Care | Attending: Vascular Surgery

## 2020-05-14 ENCOUNTER — Encounter: Payer: Self-pay | Admitting: Vascular Surgery

## 2020-05-14 DIAGNOSIS — I2510 Atherosclerotic heart disease of native coronary artery without angina pectoris: Secondary | ICD-10-CM | POA: Diagnosis not present

## 2020-05-14 DIAGNOSIS — I161 Hypertensive emergency: Secondary | ICD-10-CM | POA: Diagnosis not present

## 2020-05-14 DIAGNOSIS — I7101 Dissection of thoracic aorta: Secondary | ICD-10-CM | POA: Diagnosis not present

## 2020-05-14 DIAGNOSIS — I2584 Coronary atherosclerosis due to calcified coronary lesion: Secondary | ICD-10-CM | POA: Diagnosis not present

## 2020-05-14 DIAGNOSIS — I7102 Dissection of abdominal aorta: Secondary | ICD-10-CM | POA: Diagnosis not present

## 2020-05-14 DIAGNOSIS — I71012 Dissection of descending thoracic aorta: Secondary | ICD-10-CM

## 2020-05-14 HISTORY — PX: CAROTID-SUBCLAVIAN BYPASS GRAFT: SHX910

## 2020-05-14 HISTORY — PX: THORACIC AORTIC ENDOVASCULAR STENT GRAFT: SHX6112

## 2020-05-14 HISTORY — DX: Dissection of descending thoracic aorta: I71.012

## 2020-05-14 HISTORY — PX: ULTRASOUND GUIDANCE FOR VASCULAR ACCESS: SHX6516

## 2020-05-14 LAB — POCT I-STAT 7, (LYTES, BLD GAS, ICA,H+H)
Acid-Base Excess: 0 mmol/L (ref 0.0–2.0)
Bicarbonate: 23.9 mmol/L (ref 20.0–28.0)
Calcium, Ion: 1.14 mmol/L — ABNORMAL LOW (ref 1.15–1.40)
HCT: 23 % — ABNORMAL LOW (ref 36.0–46.0)
Hemoglobin: 7.8 g/dL — ABNORMAL LOW (ref 12.0–15.0)
O2 Saturation: 97 %
Patient temperature: 38.1
Potassium: 3.6 mmol/L (ref 3.5–5.1)
Sodium: 136 mmol/L (ref 135–145)
TCO2: 25 mmol/L (ref 22–32)
pCO2 arterial: 38.4 mmHg (ref 32.0–48.0)
pH, Arterial: 7.406 (ref 7.350–7.450)
pO2, Arterial: 92 mmHg (ref 83.0–108.0)

## 2020-05-14 LAB — POCT ACTIVATED CLOTTING TIME
Activated Clotting Time: 303 seconds
Activated Clotting Time: 255 seconds
Activated Clotting Time: 202 seconds
Activated Clotting Time: 267 seconds
Activated Clotting Time: 208 seconds
Activated Clotting Time: 225 seconds

## 2020-05-14 LAB — CBC
HCT: 27.9 % — ABNORMAL LOW (ref 36.0–46.0)
Hemoglobin: 9.2 g/dL — ABNORMAL LOW (ref 12.0–15.0)
MCH: 30.2 pg (ref 26.0–34.0)
MCHC: 33 g/dL (ref 30.0–36.0)
MCV: 91.5 fL (ref 80.0–100.0)
Platelets: 200 10*3/uL (ref 150–400)
RBC: 3.05 MIL/uL — ABNORMAL LOW (ref 3.87–5.11)
RDW: 13.2 % (ref 11.5–15.5)
WBC: 8.1 10*3/uL (ref 4.0–10.5)
nRBC: 0 % (ref 0.0–0.2)

## 2020-05-14 LAB — PROTIME-INR
INR: 1.5 — ABNORMAL HIGH (ref 0.8–1.2)
Prothrombin Time: 17.4 seconds — ABNORMAL HIGH (ref 11.4–15.2)

## 2020-05-14 LAB — BASIC METABOLIC PANEL
Anion gap: 11 (ref 5–15)
BUN: 12 mg/dL (ref 8–23)
CO2: 23 mmol/L (ref 22–32)
Calcium: 8.2 mg/dL — ABNORMAL LOW (ref 8.9–10.3)
Chloride: 99 mmol/L (ref 98–111)
Creatinine, Ser: 0.82 mg/dL (ref 0.44–1.00)
GFR, Estimated: >60 mL/min (ref >60)
Glucose, Bld: 151 mg/dL — ABNORMAL HIGH (ref 70–99)
Potassium: 3.6 mmol/L (ref 3.5–5.1)
Sodium: 133 mmol/L — ABNORMAL LOW (ref 135–145)

## 2020-05-14 LAB — APTT: aPTT: 32 seconds (ref 24–36)

## 2020-05-14 LAB — GLUCOSE, CAPILLARY: Glucose-Capillary: 137 mg/dL — ABNORMAL HIGH (ref 70–99)

## 2020-05-14 LAB — MAGNESIUM: Magnesium: 1.4 mg/dL — ABNORMAL LOW (ref 1.7–2.4)

## 2020-05-14 SURGERY — CREATION, BYPASS, ARTERIAL, SUBCLAVIAN TO CAROTID, USING GRAFT
Anesthesia: General | Site: Neck

## 2020-05-14 MED ORDER — PANTOPRAZOLE SODIUM 40 MG PO TBEC: 40.0000 mg | DELAYED_RELEASE_TABLET | Freq: Every day | ORAL | Status: DC

## 2020-05-14 MED ORDER — CEFAZOLIN SODIUM 1 G IJ SOLR
INTRAMUSCULAR | Status: AC
  Filled 2020-05-14: qty 20

## 2020-05-14 MED ORDER — LACTATED RINGERS IV SOLN: INTRAVENOUS | Status: DC | PRN

## 2020-05-14 MED ORDER — POTASSIUM CHLORIDE CRYS ER 20 MEQ PO TBCR: 20.0000 meq | EXTENDED_RELEASE_TABLET | Freq: Every day | ORAL | Status: DC | PRN

## 2020-05-14 MED ORDER — ROCURONIUM BROMIDE 10 MG/ML (PF) SYRINGE
PREFILLED_SYRINGE | INTRAVENOUS | Status: DC | PRN
  Administered 2020-05-14: 100 mg via INTRAVENOUS
  Administered 2020-05-14 (×3): 50 mg via INTRAVENOUS

## 2020-05-14 MED ORDER — HEPARIN SODIUM (PORCINE) 1000 UNIT/ML IJ SOLN
INTRAMUSCULAR | Status: DC | PRN
  Administered 2020-05-14: 9000 [IU] via INTRAVENOUS
  Administered 2020-05-14 (×2): 5000 [IU] via INTRAVENOUS

## 2020-05-14 MED ORDER — EPHEDRINE 5 MG/ML INJ
INTRAVENOUS | Status: AC
  Filled 2020-05-14: qty 20

## 2020-05-14 MED ORDER — METOPROLOL TARTRATE 5 MG/5ML IV SOLN: 2.0000 mg | INTRAVENOUS | Status: DC | PRN

## 2020-05-14 MED ORDER — ALBUMIN HUMAN 5 % IV SOLN: INTRAVENOUS | Status: DC | PRN

## 2020-05-14 MED ORDER — ONDANSETRON HCL 4 MG/2ML IJ SOLN
4.0000 mg | Freq: Four times a day (QID) | INTRAMUSCULAR | Status: DC | PRN
  Administered 2020-05-18 – 2020-05-20 (×2): 4 mg via INTRAVENOUS
  Filled 2020-05-14 (×2): qty 2

## 2020-05-14 MED ORDER — SODIUM CHLORIDE 0.9 % IV SOLN
INTRAVENOUS | Status: DC | PRN
  Administered 2020-05-14 (×2): 500 mL

## 2020-05-14 MED ORDER — SODIUM CHLORIDE 0.9 % IV SOLN: 500.0000 mL | Freq: Once | INTRAVENOUS | Status: DC | PRN

## 2020-05-14 MED ORDER — OXYCODONE-ACETAMINOPHEN 5-325 MG PO TABS
1.0000 | ORAL_TABLET | ORAL | Status: DC | PRN
  Administered 2020-05-14 – 2020-05-18 (×5): 2 via ORAL
  Filled 2020-05-14 (×6): qty 2

## 2020-05-14 MED ORDER — ACETAMINOPHEN 325 MG PO TABS
325.0000 mg | ORAL_TABLET | ORAL | Status: DC | PRN
  Administered 2020-05-19: 650 mg via ORAL
  Filled 2020-05-14: qty 2

## 2020-05-14 MED ORDER — 0.9 % SODIUM CHLORIDE (POUR BTL) OPTIME
TOPICAL | Status: DC | PRN
  Administered 2020-05-14 (×3): 1000 mL

## 2020-05-14 MED ORDER — REMIFENTANIL HCL 2 MG IV SOLR
INTRAVENOUS | Status: DC | PRN
  Administered 2020-05-14: .15 ug/kg/min via INTRAVENOUS

## 2020-05-14 MED ORDER — ONDANSETRON HCL 4 MG/2ML IJ SOLN
INTRAMUSCULAR | Status: DC | PRN
  Administered 2020-05-14: 4 mg via INTRAVENOUS

## 2020-05-14 MED ORDER — DEXTROSE-NACL 5-0.9 % IV SOLN: INTRAVENOUS | Status: DC

## 2020-05-14 MED ORDER — SENNOSIDES-DOCUSATE SODIUM 8.6-50 MG PO TABS: 1.0000 | ORAL_TABLET | Freq: Every evening | ORAL | Status: DC | PRN

## 2020-05-14 MED ORDER — ACETAMINOPHEN 325 MG RE SUPP
325.0000 mg | RECTAL | Status: DC | PRN
  Filled 2020-05-14: qty 2

## 2020-05-14 MED ORDER — CEFAZOLIN SODIUM-DEXTROSE 2-3 GM-%(50ML) IV SOLR
INTRAVENOUS | Status: DC | PRN
  Administered 2020-05-14 (×2): 2 g via INTRAVENOUS

## 2020-05-14 MED ORDER — SODIUM CHLORIDE 0.9 % IV SOLN
0.0500 ug/kg/min | INTRAVENOUS | Status: DC
  Filled 2020-05-14 (×2): qty 5000

## 2020-05-14 MED ORDER — PHENYLEPHRINE 40 MCG/ML (10ML) SYRINGE FOR IV PUSH (FOR BLOOD PRESSURE SUPPORT)
PREFILLED_SYRINGE | INTRAVENOUS | Status: AC
  Filled 2020-05-14: qty 30

## 2020-05-14 MED ORDER — EPHEDRINE SULFATE 50 MG/ML IJ SOLN
INTRAMUSCULAR | Status: DC | PRN
  Administered 2020-05-14: 20 mg via INTRAVENOUS

## 2020-05-14 MED ORDER — HYDRALAZINE HCL 20 MG/ML IJ SOLN: 5.0000 mg | INTRAMUSCULAR | Status: DC | PRN

## 2020-05-14 MED ORDER — CEFAZOLIN SODIUM-DEXTROSE 2-4 GM/100ML-% IV SOLN
INTRAVENOUS | Status: AC
  Filled 2020-05-14: qty 100

## 2020-05-14 MED ORDER — LABETALOL HCL 5 MG/ML IV SOLN: 10.0000 mg | INTRAVENOUS | Status: DC | PRN

## 2020-05-14 MED ORDER — ROCURONIUM BROMIDE 10 MG/ML (PF) SYRINGE
PREFILLED_SYRINGE | INTRAVENOUS | Status: AC
  Filled 2020-05-14: qty 50

## 2020-05-14 MED ORDER — DOCUSATE SODIUM 100 MG PO CAPS: 100.0000 mg | ORAL_CAPSULE | Freq: Every day | ORAL | Status: DC

## 2020-05-14 MED ORDER — LIDOCAINE 2% (20 MG/ML) 5 ML SYRINGE
INTRAMUSCULAR | Status: DC | PRN
  Administered 2020-05-14: 100 mg via INTRAVENOUS

## 2020-05-14 MED ORDER — CEFAZOLIN SODIUM-DEXTROSE 2-4 GM/100ML-% IV SOLN
2.0000 g | Freq: Three times a day (TID) | INTRAVENOUS | Status: AC
  Administered 2020-05-14 – 2020-05-15 (×2): 2 g via INTRAVENOUS
  Filled 2020-05-14 (×2): qty 100

## 2020-05-14 MED ORDER — IODIXANOL 320 MG/ML IV SOLN
INTRAVENOUS | Status: DC | PRN
  Administered 2020-05-14: 150 mL

## 2020-05-14 MED ORDER — FENTANYL CITRATE (PF) 250 MCG/5ML IJ SOLN
INTRAMUSCULAR | Status: AC
  Filled 2020-05-14: qty 5

## 2020-05-14 MED ORDER — PHENYLEPHRINE HCL (PRESSORS) 10 MG/ML IV SOLN
INTRAVENOUS | Status: DC | PRN
  Administered 2020-05-14: 80 ug via INTRAVENOUS
  Administered 2020-05-14: 120 ug via INTRAVENOUS

## 2020-05-14 MED ORDER — PROTAMINE SULFATE 10 MG/ML IV SOLN
INTRAVENOUS | Status: DC | PRN
  Administered 2020-05-14: 50 mg via INTRAVENOUS

## 2020-05-14 MED ORDER — MAGNESIUM SULFATE 2 GM/50ML IV SOLN: 2.0000 g | Freq: Every day | INTRAVENOUS | Status: DC | PRN

## 2020-05-14 MED ORDER — PROTAMINE SULFATE 10 MG/ML IV SOLN
INTRAVENOUS | Status: AC
  Filled 2020-05-14: qty 10

## 2020-05-14 MED ORDER — NITROGLYCERIN 0.2 MG/ML ON CALL CATH LAB
INTRAVENOUS | Status: DC | PRN
  Administered 2020-05-14: 40 ug via INTRAVENOUS
  Administered 2020-05-14 (×2): 60 ug via INTRAVENOUS

## 2020-05-14 MED ORDER — FENTANYL CITRATE (PF) 100 MCG/2ML IJ SOLN
25.0000 ug | INTRAMUSCULAR | Status: DC | PRN
  Administered 2020-05-14: 25 ug via INTRAVENOUS

## 2020-05-14 MED ORDER — SODIUM CHLORIDE (PF) 0.9 % IJ SOLN
INTRAMUSCULAR | Status: AC
  Filled 2020-05-14: qty 20

## 2020-05-14 MED ORDER — GUAIFENESIN-DM 100-10 MG/5ML PO SYRP: 15.0000 mL | ORAL_SOLUTION | ORAL | Status: DC | PRN

## 2020-05-14 MED ORDER — ONDANSETRON HCL 4 MG/2ML IJ SOLN
INTRAMUSCULAR | Status: AC
  Filled 2020-05-14: qty 4

## 2020-05-14 MED ORDER — SODIUM CHLORIDE 0.9 % IV SOLN
INTRAVENOUS | Status: AC
  Filled 2020-05-14 (×2): qty 1.2

## 2020-05-14 MED ORDER — DEXAMETHASONE SODIUM PHOSPHATE 10 MG/ML IJ SOLN
INTRAMUSCULAR | Status: AC
  Filled 2020-05-14: qty 2

## 2020-05-14 MED ORDER — DEXAMETHASONE SODIUM PHOSPHATE 10 MG/ML IJ SOLN
INTRAMUSCULAR | Status: DC | PRN
  Administered 2020-05-14: 10 mg via INTRAVENOUS

## 2020-05-14 MED ORDER — HEPARIN SODIUM (PORCINE) 1000 UNIT/ML IJ SOLN
INTRAMUSCULAR | Status: AC
  Filled 2020-05-14: qty 1

## 2020-05-14 MED ORDER — PHENOL 1.4 % MT LIQD: 1.0000 | OROMUCOSAL | Status: DC | PRN

## 2020-05-14 MED ORDER — FENTANYL CITRATE (PF) 100 MCG/2ML IJ SOLN
INTRAMUSCULAR | Status: AC
  Filled 2020-05-14: qty 2

## 2020-05-14 MED ORDER — ALUM & MAG HYDROXIDE-SIMETH 200-200-20 MG/5ML PO SUSP: 15.0000 mL | ORAL | Status: DC | PRN

## 2020-05-14 MED ORDER — PHENYLEPHRINE HCL-NACL 10-0.9 MG/250ML-% IV SOLN
INTRAVENOUS | Status: DC | PRN
  Administered 2020-05-14: 30 ug/min via INTRAVENOUS

## 2020-05-14 MED ORDER — PROPOFOL 10 MG/ML IV BOLUS
INTRAVENOUS | Status: DC | PRN
  Administered 2020-05-14: 50 mg via INTRAVENOUS
  Administered 2020-05-14: 100 mg via INTRAVENOUS

## 2020-05-14 MED ORDER — EPINEPHRINE 1 MG/10ML IJ SOSY
PREFILLED_SYRINGE | INTRAMUSCULAR | Status: DC | PRN
  Administered 2020-05-14: .05 mg via INTRAVENOUS

## 2020-05-14 MED ORDER — ONDANSETRON HCL 4 MG/2ML IJ SOLN: 4.0000 mg | Freq: Once | INTRAMUSCULAR | Status: DC | PRN

## 2020-05-14 MED ORDER — LIDOCAINE HCL 1 % IJ SOLN
INTRAMUSCULAR | Status: AC
  Filled 2020-05-14: qty 20

## 2020-05-14 MED ORDER — PROPOFOL 10 MG/ML IV BOLUS
INTRAVENOUS | Status: AC
  Filled 2020-05-14: qty 20

## 2020-05-14 MED ORDER — LIDOCAINE 2% (20 MG/ML) 5 ML SYRINGE
INTRAMUSCULAR | Status: AC
  Filled 2020-05-14: qty 10

## 2020-05-14 MED ORDER — SUGAMMADEX SODIUM 200 MG/2ML IV SOLN
INTRAVENOUS | Status: DC | PRN
  Administered 2020-05-14: 200 mg via INTRAVENOUS

## 2020-05-14 MED ORDER — MORPHINE SULFATE (PF) 2 MG/ML IV SOLN
2.0000 mg | INTRAVENOUS | Status: DC | PRN
  Administered 2020-05-14 – 2020-05-15 (×3): 2 mg via INTRAVENOUS
  Administered 2020-05-15: 4 mg via INTRAVENOUS
  Administered 2020-05-17 (×2): 2 mg via INTRAVENOUS
  Administered 2020-05-17 (×2): 4 mg via INTRAVENOUS
  Administered 2020-05-17 – 2020-05-21 (×8): 2 mg via INTRAVENOUS
  Filled 2020-05-14: qty 1
  Filled 2020-05-14: qty 2
  Filled 2020-05-14: qty 1
  Filled 2020-05-14: qty 2
  Filled 2020-05-14: qty 1
  Filled 2020-05-14 (×2): qty 2
  Filled 2020-05-14 (×7): qty 1
  Filled 2020-05-14: qty 2

## 2020-05-14 MED ORDER — HEMOSTATIC AGENTS (NO CHARGE) OPTIME
TOPICAL | Status: DC | PRN
  Administered 2020-05-14 (×2): 1 via TOPICAL

## 2020-05-14 MED ORDER — FENTANYL CITRATE (PF) 250 MCG/5ML IJ SOLN
INTRAMUSCULAR | Status: DC | PRN
  Administered 2020-05-14: 50 ug via INTRAVENOUS
  Administered 2020-05-14: 25 ug via INTRAVENOUS
  Administered 2020-05-14: 50 ug via INTRAVENOUS
  Administered 2020-05-14: 100 ug via INTRAVENOUS
  Administered 2020-05-14: 25 ug via INTRAVENOUS
  Administered 2020-05-14: 50 ug via INTRAVENOUS

## 2020-05-14 MED ORDER — CHLORHEXIDINE GLUCONATE 0.12 % MT SOLN
OROMUCOSAL | Status: AC
  Filled 2020-05-14: qty 15

## 2020-05-14 SURGICAL SUPPLY — 88 items
ADH SKN CLS APL DERMABOND .7 (GAUZE/BANDAGES/DRESSINGS) ×8
AGENT HMST SPONGE THK3/8 (HEMOSTASIS) ×8
BAG SNAP BAND KOVER 36X36 (MISCELLANEOUS) ×2 IMPLANT
BIOPATCH RED 1 DISK 7.0 (GAUZE/BANDAGES/DRESSINGS) ×4 IMPLANT
CANISTER SUCT 3000ML PPV (MISCELLANEOUS) ×5 IMPLANT
CANNULA VESSEL 3MM 2 BLNT TIP (CANNULA) ×2 IMPLANT
CATH ACCU-VU SIZ PIG 5F 100CM (CATHETERS) ×5 IMPLANT
CATH BEACON 5 .035 65 KMP TIP (CATHETERS) ×2 IMPLANT
CATH BEACON 5.038 65CM KMP-01 (CATHETERS) ×3 IMPLANT
CATH FOLEY 2WAY SLVR  5CC 14FR (CATHETERS) ×5
CATH FOLEY 2WAY SLVR 5CC 14FR (CATHETERS) ×1 IMPLANT
CATH ROBINSON RED A/P 18FR (CATHETERS) ×2 IMPLANT
CLIP VESOCCLUDE MED 24/CT (CLIP) ×5 IMPLANT
CLIP VESOCCLUDE SM WIDE 24/CT (CLIP) ×5 IMPLANT
CLOSURE PERCLOSE PROSTYLE (VASCULAR PRODUCTS) ×10 IMPLANT
COVER DOME SNAP 22 D (MISCELLANEOUS) ×2 IMPLANT
COVER MAYO STAND STRL (DRAPES) ×2 IMPLANT
COVER PROBE W GEL 5X96 (DRAPES) ×5 IMPLANT
DECANTER SPIKE VIAL GLASS SM (MISCELLANEOUS) IMPLANT
DERMABOND ADVANCED (GAUZE/BANDAGES/DRESSINGS) ×2
DERMABOND ADVANCED .7 DNX12 (GAUZE/BANDAGES/DRESSINGS) ×5 IMPLANT
DEVICE CLOSURE PERCLS PRGLD 6F (VASCULAR PRODUCTS) ×8 IMPLANT
DRAIN HEMOVAC 1/8 X 5 (WOUND CARE) ×4 IMPLANT
DRAPE INCISE IOBAN 66X45 STRL (DRAPES) ×4 IMPLANT
DRAPE TABLE BACK 80X90 (DRAPES) ×4 IMPLANT
DRSG TEGADERM 2-3/8X2-3/4 SM (GAUZE/BANDAGES/DRESSINGS) ×7 IMPLANT
DRSG TEGADERM 4X10 (GAUZE/BANDAGES/DRESSINGS) ×2 IMPLANT
DRSG TEGADERM 4X4.75 (GAUZE/BANDAGES/DRESSINGS) ×4 IMPLANT
DRYSEAL FLEXSHEATH 22FR 33CM (SHEATH) ×1
ELECT REM PT RETURN 9FT ADLT (ELECTROSURGICAL) ×5
ELECTRODE REM PT RTRN 9FT ADLT (ELECTROSURGICAL) ×7 IMPLANT
EVACUATOR SILICONE 100CC (DRAIN) ×8 IMPLANT
GAUZE 4X4 16PLY RFD (DISPOSABLE) ×4 IMPLANT
GAUZE SPONGE 2X2 8PLY STRL LF (GAUZE/BANDAGES/DRESSINGS) ×1 IMPLANT
GAUZE SPONGE 4X4 12PLY STRL (GAUZE/BANDAGES/DRESSINGS) ×5 IMPLANT
GLOVE BIO SURGEON STRL SZ7.5 (GLOVE) ×5 IMPLANT
GOWN STRL REUS W/ TWL LRG LVL3 (GOWN DISPOSABLE) ×12 IMPLANT
GOWN STRL REUS W/TWL LRG LVL3 (GOWN DISPOSABLE) ×15
HEMOSTAT SPONGE AVITENE ULTRA (HEMOSTASIS) ×4 IMPLANT
INSERT FOGARTY SM (MISCELLANEOUS) ×4 IMPLANT
KIT BASIN OR (CUSTOM PROCEDURE TRAY) ×5 IMPLANT
KIT DRAIN CSF ACCUDRAIN (MISCELLANEOUS) ×2 IMPLANT
KIT MICROPUNCTURE NIT STIFF (SHEATH) ×2 IMPLANT
KIT TURNOVER KIT B (KITS) ×5 IMPLANT
LOOP VESSEL MINI RED (MISCELLANEOUS) ×2 IMPLANT
NDL PERC 18GX7CM (NEEDLE) ×3 IMPLANT
NEEDLE 22X1 1/2 (OR ONLY) (NEEDLE) IMPLANT
NEEDLE PERC 18GX7CM (NEEDLE) IMPLANT
NS IRRIG 1000ML POUR BTL (IV SOLUTION) ×10 IMPLANT
PACK CAROTID (CUSTOM PROCEDURE TRAY) ×5 IMPLANT
PACK ENDOVASCULAR (PACKS) ×5 IMPLANT
PAD ARMBOARD 7.5X6 YLW CONV (MISCELLANEOUS) ×10 IMPLANT
PERCLOSE PROGLIDE 6F (VASCULAR PRODUCTS) ×10
POSITIONER HEAD DONUT 9IN (MISCELLANEOUS) ×5 IMPLANT
PUNCH AORTIC ROTATE 5MM 8IN (MISCELLANEOUS) ×2 IMPLANT
SHEATH DRYSEAL FLEX 22FR 33CM (SHEATH) ×1 IMPLANT
SHEATH PINNACLE 5F 10CM (SHEATH) ×2 IMPLANT
SHEATH PINNACLE 8F 10CM (SHEATH) IMPLANT
SHUNT CAROTID BYPASS 10 (VASCULAR PRODUCTS) IMPLANT
SPONGE GAUZE 2X2 8PLY STRL LF (GAUZE/BANDAGES/DRESSINGS) ×2 IMPLANT
SPONGE GAUZE 2X2 STER 10/PKG (GAUZE/BANDAGES/DRESSINGS) ×1
SPONGE LAP 18X18 RF (DISPOSABLE) ×2 IMPLANT
SPONGE SURGIFOAM ABS GEL 100 (HEMOSTASIS) IMPLANT
STENT GRFT THORAC ACS 34X34X15 (Endovascular Graft) ×2 IMPLANT
STENT THORAC ACS 34X34X20 (Endovascular Graft) ×2 IMPLANT
STOPCOCK MORSE 400PSI 3WAY (MISCELLANEOUS) ×7 IMPLANT
SUT ETHILON 3 0 PS 1 (SUTURE) ×4 IMPLANT
SUT PROLENE 3 0 SH1 36 (SUTURE) IMPLANT
SUT PROLENE 5 0 C 1 24 (SUTURE) ×18 IMPLANT
SUT PROLENE 6 0 CC (SUTURE) ×20 IMPLANT
SUT PROLENE 7 0 BV 1 (SUTURE) IMPLANT
SUT SILK 2 0 PERMA HAND 18 BK (SUTURE) ×2 IMPLANT
SUT VIC AB 2-0 SH 27 (SUTURE) ×5
SUT VIC AB 2-0 SH 27XBRD (SUTURE) ×1 IMPLANT
SUT VIC AB 3-0 SH 27 (SUTURE) ×25
SUT VIC AB 3-0 SH 27X BRD (SUTURE) ×8 IMPLANT
SUT VIC AB 4-0 PS2 18 (SUTURE) ×2 IMPLANT
SUT VICRYL 4-0 PS2 18IN ABS (SUTURE) ×10 IMPLANT
SYR 50ML LL SCALE MARK (SYRINGE) ×3 IMPLANT
SYR CONTROL 10ML LL (SYRINGE) ×2 IMPLANT
SYR MEDRAD MARK V 150ML (SYRINGE) ×2 IMPLANT
TOWEL GREEN STERILE (TOWEL DISPOSABLE) ×5 IMPLANT
TRAY FOLEY MTR SLVR 16FR STAT (SET/KITS/TRAYS/PACK) ×5 IMPLANT
TUBING HIGH PRESSURE 120CM (CONNECTOR) ×5 IMPLANT
WATER STERILE IRR 1000ML POUR (IV SOLUTION) ×5 IMPLANT
WIRE BENTSON .035X145CM (WIRE) ×5 IMPLANT
WIRE STARTER BENTSON 035X150 (WIRE) ×2 IMPLANT
WIRE STIFF LUNDERQUIST 260CM (WIRE) ×5 IMPLANT

## 2020-05-14 NOTE — Anesthesia Procedure Notes (Signed)
Arterial Line Insertion Start/End3/01/2021 10:13 AM, 05/14/2020 10:23 AM Performed by: Duane Boston, MD  Patient location: Pre-op. Preanesthetic checklist: patient identified, IV checked, site marked, risks and benefits discussed, surgical consent, monitors and equipment checked, pre-op evaluation, timeout performed and anesthesia consent Lidocaine 1% used for infiltration Right, brachial was placed Catheter size: 20 Fr Hand hygiene performed  and maximum sterile barriers used   Attempts: 1 Procedure performed using ultrasound guided technique. Ultrasound Notes:anatomy identified, needle tip was noted to be adjacent to the nerve/plexus identified, no ultrasound evidence of intravascular and/or intraneural injection and image(s) printed for medical record Following insertion, dressing applied and Biopatch. Post procedure assessment: normal and unchanged  Patient tolerated the procedure well with no immediate complications.

## 2020-05-14 NOTE — Transfer of Care (Signed)
Immediate Anesthesia Transfer of Care Note  Patient: Angel Lane  Procedure(s) Performed: BILATERAL CAROTID-SUBCLAVIAN  TRANSPOSITION (Bilateral Neck) THORACIC AORTIC ENDOVASCULAR STENT USING GORE TAG 59mm X 15cm GRAFT AND GORE TAG 58mm X 20cm GRAFT (N/A Chest) ULTRASOUND GUIDANCE FOR VASCULAR ACCESS (Bilateral Groin)  Patient Location: PACU  Anesthesia Type:General  Level of Consciousness: awake and alert   Airway & Oxygen Therapy: Patient Spontanous Breathing and Patient connected to face mask oxygen  Post-op Assessment: Report given to RN and Post -op Vital signs reviewed and stable  Post vital signs: Reviewed and stable  Last Vitals:  Vitals Value Taken Time  BP 96/63 05/14/20 1854  Temp    Pulse 72 05/14/20 1856  Resp 18 05/14/20 1856  SpO2 98 % 05/14/20 1856  Vitals shown include unvalidated device data.  Last Pain:  Vitals:   05/14/20 0752  TempSrc: Oral  PainSc: 10-Worst pain ever      Patients Stated Pain Goal: 2 (09/60/45 4098)  Complications: No complications documented.

## 2020-05-14 NOTE — Anesthesia Postprocedure Evaluation (Signed)
Anesthesia Post Note  Patient: Angel Lane  Procedure(s) Performed: BILATERAL CAROTID-SUBCLAVIAN  TRANSPOSITION (Bilateral Neck) THORACIC AORTIC ENDOVASCULAR STENT USING GORE TAG 74mm X 15cm GRAFT AND GORE TAG 73mm X 20cm GRAFT (N/A Chest) ULTRASOUND GUIDANCE FOR VASCULAR ACCESS (Bilateral Groin)     Patient location during evaluation: PACU Anesthesia Type: General Level of consciousness: awake and alert Pain management: pain level controlled Vital Signs Assessment: post-procedure vital signs reviewed and stable Respiratory status: spontaneous breathing, nonlabored ventilation, respiratory function stable and patient connected to nasal cannula oxygen Cardiovascular status: blood pressure returned to baseline and stable Postop Assessment: no apparent nausea or vomiting Anesthetic complications: no   No complications documented.  Last Vitals:  Vitals:   05/14/20 1930 05/14/20 1945  BP: 102/62 (!) 98/57  Pulse: 73 73  Resp: 19 20  Temp: (!) 36.2 C   SpO2: 98% 98%    Last Pain:  Vitals:   05/14/20 1945  TempSrc:   PainSc: Asleep                 Catalina Gravel

## 2020-05-14 NOTE — Interval H&P Note (Signed)
History and Physical Interval Note:  05/14/2020 10:41 AM  Angel Lane  has presented today for surgery, with the diagnosis of [place].  The various methods of treatment have been discussed with the patient and family. After consideration of risks, benefits and other options for treatment, the patient has consented to  Procedure(s) with comments: BILATERAL CAROTID-SUBCLAVIAN BYPASS GRAFT (Bilateral) - Needs Lumbar drain BILATERAL COIL SUBCLAVIAN (Bilateral) THORACIC AORTIC ENDOVASCULAR STENT GRAFT (N/A) as a surgical intervention.  The patient's history has been reviewed, patient examined, no change in status, stable for surgery.  I have reviewed the patient's chart and labs.  Questions were answered to the patient's satisfaction.     [name]

## 2020-05-14 NOTE — Anesthesia Procedure Notes (Signed)
Lumbar Drain  Patient location during procedure: OR Start time: 05/14/2020 11:16 AM End time: 05/14/2020 11:26 AM Staffing Performed: anesthesiologist  Anesthesiologist: Duane Boston, MD Preanesthetic Checklist Completed: patient identified, IV checked, risks and benefits discussed, surgical consent, monitors and equipment checked, pre-op evaluation and timeout performed Lumbar Puncture:  Patient position: left lateral decubitus Prep: DuraPrep Patient monitoring: heart rate, cardiac monitor, continuous pulse ox and blood pressure Approach: midline Location: L3-4 Injection technique: catheter Needle Needle type: Tuohy  Needle gauge: 14 G Needle length: 9 cm Catheter type: closed end flexible Catheter size: 20 g Catheter at skin depth: 13 cm Assessment Attempts: 1 CSF: clear Post Procedure: drain attached to lumbar drainage system, lumbar pressure transduced, site cleaned and sterile dressing applied

## 2020-05-14 NOTE — Progress Notes (Addendum)
Patient awake in PACU.  She is moving both of her lower extremities.  She is able to talk and converse and say her name.  She has palpable pulses in her feet.  Transfer to [facility] when bed available.  She needs the right brachial A-line removed.  Patient's condition explained and updated by phone to the patient's husband.  [name], MD [facility] [place] Office: [phone]

## 2020-05-14 NOTE — Op Note (Signed)
Procedure: 1.  Left subclavian transposition bypass  2.  Right subclavian transposition bypass  3.  Placement of Gore thoracic endograft, 34 x 20, 34 x 15  4.  Insertion of right femoral venous catheter  5.  Ultrasound right groin and left groin  6.  Right femoral cutdown and repair of right common femoral artery  Preoperative diagnosis: Type B aortic dissection  Postoperative diagnosis: Same  Anesthesia: General  Assistant: Marjean Donna, MD, Annamarie Major, MD to expedite procedure and assist with creation of anastomosis manipulation of wires and exposure.  Operative findings: 1.  Gore thoracic stent graft extending from the left common carotid artery to the celiac  2.  Lumbar drain  3.  Bilateral Jackson-Pratt drains  4.  5 French sheath left common femoral vein for access  Operative details: After obtaining form consent, the patient was taken the operating room.  A lumbar drain was placed by the anesthesia team.  The patient also had a radial A-line and a brachial A-line placed by the anesthesia team and opposite arms. The patient was placed in supine position operating table.  After induction general anesthesia endotracheal intubation, the patient was prepped and draped from the chin to the knees in usual sterile fashion.  Next an oblique incision was made in the left supraclavicular space over the heads of the sternocleidomastoid muscle.  Incision was carried down through the subcutaneous tissues down the level of platysma.  Platysma was incised for the full length the incision.  Jugular vein was retracted laterally and the common carotid artery and vagus nerve exposed.  The vagus nerve was protected.  Dissection was then carried down the level of the subclavian artery.  The costocervical thyrocervical trunks were dissected free circumferentially and Vesseloops placed around these.  Internal mammary was dissected free and a vessel placed around this.  Vertebral artery was identified  after ligating the vertebral vein and this was dissected free circumferentially and Vesseloops placed around it.  Care was taken to try to preserve all nerve structures and the surrounding area.  The anterior scalene muscle was identified but the phrenic nerve was not in this portion of the dissection.  Next dissection proceeded down the proximal subclavian as far as I could get towards the aortic arch to give [place] enough room proximal to the vertebral artery.  The patient was given heparin to maintain an ACT greater than 250 for the remainder of the procedure including both bypasses and her stent graft.  A Hanley clamp was placed on the proximal and mid subclavian artery just proximal to the vertebral.  The artery was then transected and the proximal aspect oversewn with a running 5-0 Prolene suture.  The artery was then swung up to level the common carotid artery and the branches controlled with Vesseloops.  A longitudinal opening was made in the common carotid artery after clamping proximally distally.  5 punch was used to extend the arteriotomy in the common carotid artery.  The proximal subclavian artery was then sewn end of subclavian to side of common carotid artery using a running 6-0 Prolene suture.  Despite completion anastomosis it was for blood backbled and thoroughly flushed reanastomosed was secured clamps were released there was a good pulse within the common carotid and subclavian artery immediately and there was good Doppler flow in the vertebral artery.  There was some bleeding from the heel and one pledgeted suture was placed to repair this.  This was then packed with Avitene and gauze.  Attention  was then turned to the right neck.  A similar incision was made carried down through the skin and platysma.  The jugular vein was dissected free circumferentially to expose the common carotid artery and the vagus nerve.  These were protected.  Vesseloops placed around the common carotid artery.   Dissection was then carried down to the level of the thyrocervical trunk and this was traced to the level of the right subclavian artery.  It was dissected free circumferentially.  It was then traced all the way up to level the vertebral artery and Vesseloops placed around this.  We then dissected as far proximally onto the right subclavian artery as was practical.  This gave [place] enough room to clamped proximally distally.  The artery was clamped proximally distally and transected and the proximal stump oversewn with running 5-0 Prolene suture.  Common carotid artery was clamped proximally and distally.  A longitudinal opening was made in the common carotid artery and opened further with a 5 punch.  The distal end of the subclavian artery was then sewn end-to-side to the common carotid artery using a running 6-0 Prolene suture.  Just prior to completion anastomosis it was for blood backbled and thoroughly flushed reanastomosed was secured clamps released there was good pulse in the common carotid and subclavian arteries and good Doppler flow in the vertebral artery.  This was then packed with dry gauze as well.  At this point it was noted she was still bleeding some from the left anastomosis.  We placed the retractors back into the left neck incision and during this time the vagus nerve was irritated and the patient bradycardia down to a brief episode of asystole lasting about 10 seconds.  She then quickly recovered.  She did get one brief dose of epinephrine and her pressure recovered.  There was no further instability.  One additional stitch was placed in the heel of the anastomosis and hemostasis was obtained.  Attention was then turned to the groins.  Ultrasound was used to identify the left common femoral vein and a micropuncture needle used to cannulate the left common femoral vein and the microwire advanced into the femoral vein.  Micropuncture sheath was then placed over this and swapped out for a Bentson  wire.  A 5 French sheath was then placed over the Bentson wire into the femoral vein to be used for venous access if necessary for large-volume infusion.  This was sutured the skin with a silk suture.  Attention was then turned to the right groin.  The right common femoral artery was identified with ultrasound.  The femoral bifurcation was also identified.  Micropuncture needle was used to cannulate the right common femoral artery and the micropuncture wire advanced in the right iliac system.  Micropuncture sheath placed over this.  It was then swapped out for a Bentson wire.  The 9 French dilator was placed over this and 2 pro glides were placed at the 3:00 and 9 o'clock position.  The Bentson wire was then swapped out for the double curved Lunderquist wire wire and this was advanced into the abdominal aorta and a 22 French dry seal sheath advanced over this into the distal descending thoracic aorta.  The Lunderquist wire was advanced up into the aortic arch and a pigtail catheter placed over this and contrast angiogram obtained in a 60 degree LAO position.  This showed wide patency of the pre-existing subclavian transpositions with wide patency of the and of the vertebral arteries as  well.  The left common carotid and right common carotid arteries were identified.  Everything was marked.  We then loaded a 34 x 20 gore tag graft and advanced this up to the level of the left common carotid and deployed this.  The pigtail catheter was then pulled down over the guidewire and readvanced into the stent graft and the stent graft had the pigtail catheter placed back up inside of it and a contrast angiogram was done to perform and show the celiac origin.  This was then marked and a 34 x 15 graft extension placed going all the way down to about 2 cm above the celiac axis.  This was then deployed in the delivery system removed.  The pigtail catheter was then advanced back up into the ascending aorta and a completion  angiogram obtained which showed the origin of the right and left common carotid arteries were still patent there was good wall apposition of the stent graft right adjacent to the left common carotid and no evidence of endoleak.  The celiac artery still filled.  At this point the sheath was removed over a Bentson wire.  I then secured the pro glides and there was still bleeding.  I fired 2 additional pro glides and there was still bleeding from the artery.  Therefore at this point we placed the 9 French sheath into the right common femoral artery and cut down on this.  There was some bleeding around the sheath but this was controlled with digital pressure.  I was able to get the common femoral artery dissected free circumferentially just underneath inguinal ligament.  There were a couple circumflex iliac branches which Vesseloops were placed around.  The common femoral artery was then dissected free circumferentially below the arteriotomy.  All the Pro-glide sutures were removed.  The artery was inspected the posterior wall of the artery was intact with no elevated intimal flaps.  The arteriotomy was then repaired with a running 5-0 Prolene suture.  His prior to completion anastomosis it was for blood backbled thoroughly flushed reanastomosed was secured clamps released was pulsatile flow in the common femoral artery medially.  Patient had good Doppler signals in both feet.  Hemostasis was obtained with the administration of 50 mg of protamine.  We then proceeded to close the right groin incision with multiple layers of running 2-0 and 3-0 Vicryl suture and 4-0 Vicryl subcuticular stitch in the skin.  Each neck incision had a 10 flat Jackson-Pratt drain placed through the subcutaneous tissues and down adjacent to the anastomosis.  These were both thoroughly irrigated normal saline solution.  The drains were sutured to the skin with a nylon stitch.  The platysma muscle was then reapproximated using running 3-0 Vicryl  suture.  The skin of both incisions was closed with a 4-0 Vicryl subcuticular stitch.  Patient tied procedure well and there were no complications.  The incident sponge needle counts correct in the case.  Patient was taken to recovery room in stable condition.  [name], MD [facility] Office: [phone]

## 2020-05-14 NOTE — Anesthesia Preprocedure Evaluation (Addendum)
Anesthesia Evaluation  Patient identified by MRN, date of birth, ID band Patient awake    Reviewed: Allergy & Precautions, NPO status , Patient's Chart, lab work & pertinent test results  History of Anesthesia Complications Negative for: history of anesthetic complications  Airway Mallampati: II  TM Distance: >3 FB Neck ROM: Full    Dental no notable dental hx. (+) Dental Advisory Given   Pulmonary sleep apnea and Continuous Positive Airway Pressure Ventilation , Current Smoker,    Pulmonary exam normal        Cardiovascular hypertension, Pt. on medications and Pt. on home beta blockers Normal cardiovascular exam + Systolic murmurs IMPRESSIONS   1. Left ventricular ejection fraction, by estimation, is 60 to 65%. The left ventricle has normal function. The left ventricle has no regional wall motion abnormalities. Left ventricular diastolic parameters are consistent with Grade I diastolic  dysfunction (impaired relaxation). Elevated left atrial pressure. 2. Right ventricular systolic function is normal. The right ventricular size is normal. 3. The mitral valve is normal in structure. No evidence of mitral valve regurgitation. No evidence of mitral stenosis. 4. The aortic valve is tricuspid. Aortic valve regurgitation is moderate. 5. Descending aorta not well visualized. 6. The inferior vena cava is normal in size with greater than 50% respiratory variability, suggesting right atrial pressure of 3 mmHg.    Neuro/Psych PSYCHIATRIC DISORDERS Depression CVA, Residual Symptoms    GI/Hepatic negative GI ROS, Neg liver ROS,   Endo/Other  negative endocrine ROS  Renal/GU negative Renal ROS     Musculoskeletal negative musculoskeletal ROS (+)   Abdominal   Peds  Hematology negative hematology ROS (+)   Anesthesia Other Findings   Reproductive/Obstetrics                            Anesthesia  Physical Anesthesia Plan  ASA: IV  Anesthesia Plan: General   Post-op Pain Management:    Induction: Intravenous  PONV Risk Score and Plan: 3 and Ondansetron and Dexamethasone  Airway Management Planned: Oral ETT  Additional Equipment: Arterial line  Intra-op Plan:   Post-operative Plan: Possible Post-op intubation/ventilation  Informed Consent: I have reviewed the patients History and Physical, chart, labs and discussed the procedure including the risks, benefits and alternatives for the proposed anesthesia with the patient or authorized representative who has indicated his/her understanding and acceptance.     Dental advisory given  Plan Discussed with: Anesthesiologist, CRNA and Surgeon  Anesthesia Plan Comments: (Plan: bilateral A-lines, Lumbar drain (after induction and intubation), Surgeon to place femoral sheath after induction/intubation.)     Anesthesia Quick Evaluation

## 2020-05-14 NOTE — Anesthesia Procedure Notes (Signed)
Procedure Name: Intubation Date/Time: 05/14/2020 11:00 AM Performed by: Clearnce Sorrel, CRNA Pre-anesthesia Checklist: Patient identified, Emergency Drugs available, Suction available and Patient being monitored Patient Re-evaluated:Patient Re-evaluated prior to induction Oxygen Delivery Method: Circle system utilized Preoxygenation: Pre-oxygenation with 100% oxygen Induction Type: IV induction Ventilation: Mask ventilation without difficulty Laryngoscope Size: Mac and 3 Grade View: Grade III Tube type: Oral Number of attempts: 1 Airway Equipment and Method: Stylet and Oral airway Placement Confirmation: ETT inserted through vocal cords under direct vision,  positive ETCO2 and breath sounds checked- equal and bilateral Tube secured with: Tape Dental Injury: Teeth and Oropharynx as per pre-operative assessment  Comments: First look by Lady Deutscher with miller 2 with grade 3 view.

## 2020-05-14 NOTE — Anesthesia Procedure Notes (Signed)
Arterial Line Insertion Start/End3/01/2021 10:25 AM, 05/14/2020 10:25 AM Performed by: Renato Shin, CRNA, CRNA  Patient location: Pre-op. Preanesthetic checklist: patient identified, IV checked, site marked, risks and benefits discussed, surgical consent, monitors and equipment checked, pre-op evaluation, timeout performed and anesthesia consent Lidocaine 1% used for infiltration Left, radial was placed Catheter size: 20 G Hand hygiene performed , maximum sterile barriers used  and Seldinger technique used Allen's test indicative of satisfactory collateral circulation Attempts: 1 Procedure performed without using ultrasound guided technique. Following insertion, dressing applied and Biopatch. Post procedure assessment: normal  Patient tolerated the procedure well with no immediate complications.

## 2020-05-14 NOTE — Progress Notes (Signed)
Progress Note  [name] Date of Encounter: 05/14/2020  [facility] Cardiologist: Skeet Latch, MD   Subjective   Continues to have chest discomfort.  Otherwise no complaints.   Inpatient Medications    Scheduled Meds: . sodium chloride   Intravenous Once  . amLODipine  5 mg Oral Q24H  . carvedilol  25 mg Oral BID WC  . Chlorhexidine Gluconate Cloth  6 each Topical Daily  . docusate sodium  100 mg Oral BID  . hydrochlorothiazide  12.5 mg Oral Daily  . mouth rinse  15 mL Mouth Rinse BID  . pantoprazole  40 mg Oral Daily  . rosuvastatin  5 mg Oral Daily  . sodium chloride flush  10-40 mL Intracatheter Q12H   Continuous Infusions: . clevidipine Stopped (05/11/20 1900)  . esmolol Stopped (05/11/20 0417)   PRN Meds: acetaminophen **OR** acetaminophen, alum & mag hydroxide-simeth, guaiFENesin-dextromethorphan, hydrALAZINE, labetalol, metoprolol tartrate, morphine injection, ondansetron, oxyCODONE, phenol, sodium chloride flush   Vital Signs    Vitals:   05/14/20 0300 05/14/20 0321 05/14/20 0500 05/14/20 0752  BP: (!) 142/55 (!) 117/57  125/70  Pulse:    79  Resp: 15  (!) 25 18  Temp: 98.8 F (37.1 C)   98.8 F (37.1 C)  TempSrc: Oral   Oral  SpO2: 100%   98%  Weight:   89.6 kg   Height:       No intake or output data in the 24 hours ending 05/14/20 0852 Last 3 Weights 05/14/2020 05/13/2020 05/11/2020  Weight (lbs) 197 lb 9.6 oz 198 lb 6.6 oz 198 lb 6.6 oz  Weight (kg) 89.631 kg 90 kg 90 kg      Telemetry    Sinus rhythm.  No events- Personally Reviewed  ECG    n/a - Personally Reviewed  Physical Exam   GEN: No acute distress.   Neck: No JVD Cardiac: RRR, no murmurs, rubs, or gallops.  Respiratory: Clear to auscultation bilaterally. GI: Soft, nontender, non-distended  MS: No edema; No deformity. Neuro:  Nonfocal  Psych: Normal affect   Labs    High Sensitivity Troponin:   Recent Labs  Lab 05/01/20 1610 05/01/20 1801  05/10/20 1001 05/10/20 1230  TROPONINIHS 5 9 4 5       Chemistry Recent Labs  Lab 05/10/20 1001 05/11/20 0553 05/12/20 0351 05/13/20 0454  NA 135 130* 129* 133*  K 3.2* 3.9 3.0* 3.4*  CL 97* 98 95* 94*  CO2 27 24 25 29   GLUCOSE 121* 168* 237* 103*  BUN 15 13 8 8   CREATININE 0.64 0.75 0.62 0.71  CALCIUM 9.1 8.5* 8.3* 9.0  PROT 7.0 6.0*  --   --   ALBUMIN 3.1* 2.4*  --   --   AST 21 21  --   --   ALT 19 18  --   --   ALKPHOS 47 43  --   --   BILITOT 1.0 1.1  --   --   GFRNONAA >60 >60 >60 >60  ANIONGAP 11 8 9 10      Hematology Recent Labs  Lab 05/11/20 0553 05/12/20 0351 05/13/20 0454  WBC 9.7 5.1 8.4  RBC 3.71* 4.69 3.54*  HGB 11.4* 14.0 10.8*  HCT 34.6* 43.3 32.0*  MCV 93.3 92.3 90.4  MCH 30.7 29.9 30.5  MCHC 32.9 32.3 33.8  RDW 12.5 12.4 12.3  PLT 213 134* 239    BNPNo results for input(s): BNP, PROBNP in the last 168 hours.  DDimer No results for input(s): DDIMER in the last 168 hours.   Radiology    No results found.  Cardiac Studies   Echo 05/02/20: 1. Left ventricular ejection fraction, by estimation, is 60 to 65%. The  left ventricle has normal function. The left ventricle has no regional  wall motion abnormalities. Left ventricular diastolic parameters are  consistent with Grade I diastolic  dysfunction (impaired relaxation). Elevated left atrial pressure.  2. Right ventricular systolic function is normal. The right ventricular  size is normal.  3. The mitral valve is normal in structure. No evidence of mitral valve  regurgitation. No evidence of mitral stenosis.  4. The aortic valve is tricuspid. Aortic valve regurgitation is moderate.  5. Descending aorta not well visualized.  6. The inferior vena cava is normal in size with greater than 50%  respiratory variability, suggesting right atrial pressure of 3 mmHg.   Patient Profile     Angel Lane is a 18F with coronary calcification, hypertension, prior stroke, tobacco abuse, and  anemia with recent hospitalization for type B aortic dissection and hypertensive emergency   Assessment & Plan    # Hypertensive emergency:  # Type B aortic dissection:   Blood pressure well-controlled.  Continue HCTZ to 12.5 mg, amlodipine and carvedilol.  Prior to her last hospitalization she was only on metoprolol and hydrochlorothiazide.  In the hospital she has had significant hypokalemia.  This raises the question of hyperaldosteronism.  Renin/aldo pending.  However on review of her CT scan she has no adrenal adenomas.  Plan to transition HCTZ to spironolactone postoperatively.     Secondary Causes of Hypertension    Medications/Herbal: OCP, steroids, stimulants, antidepressants, weight loss medication, immune suppressants, NSAIDs, sympathomimetics, alcohol, caffeine, licorice, ginseng, St. John's wort, chemo (none identified)    Sleep Apnea: Resume CPAP  Renal artery stenosis: None on CT-A 05/2020.  Renin/aldo pending Hyperaldosteronism: No adenomas on CT 05/2020  Hyper/hypothyroidism: TSH normal 02/2020  Pheochromocytoma: (testing not indicated)   Cushing's syndrome: (testing not indicated)   Coarctation of the aorta: none on CT 05/2020    # Coronary calcification:   # Hyperlipidemia: Noted on CT.  Her chest pain is likely related to her Type B aortic dissection.  If it persists, would assess for ischemia post-procedure.  LDL was 105 02/2020.  Increased rosuvastatin to 20mg  and repeat lipids/CMP in 6-8 weeks.     # Tobacco abuse: Continue to encourage cessation.         For questions or updates, please contact [facility] Please consult [email or web address] for contact info under        Signed, Skeet Latch, MD  05/14/2020, 8:52 AM

## 2020-05-15 ENCOUNTER — Encounter (HOSPITAL_COMMUNITY): Payer: Self-pay | Admitting: Anesthesiology

## 2020-05-15 LAB — CBC
HCT: 25.5 % — ABNORMAL LOW (ref 36.0–46.0)
Hemoglobin: 8.3 g/dL — ABNORMAL LOW (ref 12.0–15.0)
MCH: 29.6 pg (ref 26.0–34.0)
MCHC: 32.5 g/dL (ref 30.0–36.0)
MCV: 91.1 fL (ref 80.0–100.0)
Platelets: 190 10*3/uL (ref 150–400)
RBC: 2.8 MIL/uL — ABNORMAL LOW (ref 3.87–5.11)
RDW: 13.3 % (ref 11.5–15.5)
WBC: 6.9 10*3/uL (ref 4.0–10.5)
nRBC: 0 % (ref 0.0–0.2)

## 2020-05-15 LAB — BASIC METABOLIC PANEL
Anion gap: 6 (ref 5–15)
BUN: 9 mg/dL (ref 8–23)
CO2: 26 mmol/L (ref 22–32)
Calcium: 8 mg/dL — ABNORMAL LOW (ref 8.9–10.3)
Chloride: 104 mmol/L (ref 98–111)
Creatinine, Ser: 0.67 mg/dL (ref 0.44–1.00)
GFR, Estimated: >60 mL/min (ref >60)
Glucose, Bld: 167 mg/dL — ABNORMAL HIGH (ref 70–99)
Potassium: 3.2 mmol/L — ABNORMAL LOW (ref 3.5–5.1)
Sodium: 136 mmol/L (ref 135–145)

## 2020-05-15 MED ORDER — SODIUM CHLORIDE 0.9 % IV BOLUS
1000.0000 mL | Freq: Once | INTRAVENOUS | Status: AC
  Administered 2020-05-15: 1000 mL via INTRAVENOUS

## 2020-05-15 MED ORDER — SODIUM CHLORIDE 0.9 % IV BOLUS: 1000.0000 mL | Freq: Once | INTRAVENOUS | Status: DC

## 2020-05-15 MED ORDER — WHITE PETROLATUM EX OINT
TOPICAL_OINTMENT | CUTANEOUS | Status: AC
  Administered 2020-05-15: 0.2 via TOPICAL
  Filled 2020-05-15: qty 28.35

## 2020-05-15 MED ORDER — WHITE PETROLATUM EX OINT: TOPICAL_OINTMENT | CUTANEOUS | Status: DC | PRN

## 2020-05-15 MED ORDER — POTASSIUM CHLORIDE CRYS ER 20 MEQ PO TBCR
40.0000 meq | EXTENDED_RELEASE_TABLET | Freq: Two times a day (BID) | ORAL | Status: AC
  Administered 2020-05-15 (×2): 40 meq via ORAL
  Filled 2020-05-15 (×2): qty 2

## 2020-05-15 MED ORDER — SODIUM CHLORIDE 0.9 % IV BOLUS
1000.0000 mL | Freq: Once | INTRAVENOUS | Status: DC
  Administered 2020-05-15: 1000 mL via INTRAVENOUS

## 2020-05-15 MED ORDER — MAGNESIUM SULFATE 4 GM/100ML IV SOLN
4.0000 g | Freq: Once | INTRAVENOUS | Status: AC
  Administered 2020-05-15: 4 g via INTRAVENOUS
  Filled 2020-05-15: qty 100

## 2020-05-15 NOTE — Anesthesia Post-op Follow-up Note (Signed)
  Anesthesia Pain Follow-up Note  Patient: Angel Lane  Day #: 1  Date of Follow-up: 05/15/2020 Time: 9:58 PM  Last Vitals:  Vitals:   05/15/20 2000 05/15/20 2100  BP: (!) 86/55 (!) 105/55  Pulse: 87 86  Resp: (!) 21 (!) 21  Temp:    SpO2: (!) 88% 96%    Level of Consciousness: alert  Pain: mild   Side Effects:None  Catheter Site Exam:clean, dry, no drainage     Plan:Called to evaluate the lumbar drain. The tubing had become separated from the catheter and the nurse had placed a clamp on the catheter to prevent drainage of CSF. After d/w Dr. Trula Slade I pulled the lumbar drain out and the tip was intact. Catheter removed/tip intact at surgeon's request and D/C from anesthesia care at surgeon's request  FITZGERALD,W. EDMOND

## 2020-05-15 NOTE — Addendum Note (Signed)
Addendum  created 05/15/20 2202 by Roderic Palau, MD   Clinical Note Signed

## 2020-05-15 NOTE — Progress Notes (Signed)
    Subjective  - POD #1, s/p Bilateral carotid subclavian transposition and TEVAR for dissection  C/o pain at incision sites and HA Soft BP overnight, given 1 L fluid   Physical Exam:  Triphasic bilateral radial and pedal signals bilateral Incision soft without hematoma ABD soft       Assessment/Plan:  POD #1  CV:  Keep MAP > 60 PULM:  Encourage IS GI:  Advance diet GU:  Monitor UOP, can d/c foley Neuro:  HA likely secondary to spinal drain.  Will cap today and monitor Prophylaxis:  SCDs, protonix.  Hold on SQ heparin  Wells Eliceo Gladu 05/15/2020 8:42 AM --  Vitals:   05/15/20 0600 05/15/20 0700  BP: 120/66 (!) 115/58  Pulse: 65 66  Resp: 19 (!) 23  Temp:  97.9 F (36.6 C)  SpO2: 97% 96%    Intake/Output Summary (Last 24 hours) at 05/15/2020 0842 Last data filed at 05/15/2020 0700 Gross per 24 hour  Intake 5705.61 ml  Output 2063 ml  Net 3642.61 ml     Laboratory CBC    Component Value Date/Time   WBC 6.9 05/15/2020 0340   HGB 8.3 (L) 05/15/2020 0340   HGB 13.4 02/19/2020 1604   HCT 25.5 (L) 05/15/2020 0340   HCT 40.8 02/19/2020 1604   PLT 190 05/15/2020 0340   PLT 200 02/19/2020 1604    BMET    Component Value Date/Time   NA 136 05/15/2020 0340   NA 141 02/19/2020 1604   NA 141 10/29/2011 1553   K 3.2 (L) 05/15/2020 0340   K 3.3 (L) 10/29/2011 1553   CL 104 05/15/2020 0340   CL 108 (H) 10/29/2011 1553   CO2 26 05/15/2020 0340   CO2 26 10/29/2011 1553   GLUCOSE 167 (H) 05/15/2020 0340   GLUCOSE 104 (H) 10/29/2011 1553   BUN 9 05/15/2020 0340   BUN 17 02/19/2020 1604   BUN 18 10/29/2011 1553   CREATININE 0.67 05/15/2020 0340   CREATININE 0.87 10/29/2011 1553   CALCIUM 8.0 (L) 05/15/2020 0340   CALCIUM 9.1 10/29/2011 1553   GFRNONAA >60 05/15/2020 0340   GFRNONAA >60 10/29/2011 1553   GFRAA 78 02/19/2020 1604   GFRAA >60 10/29/2011 1553    COAG Lab Results  Component Value Date   INR 1.5 (H) 05/14/2020   INR 0.96 01/05/2017    No results found for: PTT  Antibiotics Anti-infectives (From admission, onward)   Start     Dose/Rate Route Frequency Ordered Stop   05/14/20 2030  ceFAZolin (ANCEF) IVPB 2g/100 mL premix        2 g 200 mL/hr over 30 Minutes Intravenous Every 8 hours 05/14/20 2013 05/15/20 0425   05/14/20 1031  ceFAZolin (ANCEF) 2-4 GM/100ML-% IVPB       Note to Pharmacy: Granville Lewis, Lindsi   : cabinet override      05/14/20 1031 05/14/20 2244   05/11/20 1000  cefdinir (OMNICEF) capsule 300 mg        300 mg Oral 2 times daily 05/11/20 0411 05/12/20 2142       V. Leia Alf, M.D., [facility] [place] Office: [phone] Pager:  [phone]

## 2020-05-15 NOTE — Addendum Note (Signed)
Addendum  created 05/15/20 1456 by Roderic Palau, MD   Clinical Note Signed

## 2020-05-15 NOTE — Progress Notes (Signed)
Patient refused use of CPAP for the evening 

## 2020-05-15 NOTE — Progress Notes (Addendum)
Cuff pressures reading 14N systolic and L radial a-line reading 82-95A systolic. Patient complaining of L arm tingling and numbness. Pulses palpable/dopplered in all extremities.   Lumbar drain output ~68mL/hr.   Brabham paged and ordered 1L fluid bolus and MAP >60   Addendum 0045: Brabham paged again for updates after 1L bolus. R arm cuff pressures correlate to L radial a-line with readings of 88/45 (55). Patient's L arm tingling still present. MD aware of decreased urine output and lumbar drain output. No new orders at this time.

## 2020-05-15 NOTE — Plan of Care (Signed)
  Problem: Pain Management: Goal: General experience of comfort will improve Outcome: Not Progressing   Highly sensitive to slight movement and/or light touch; reluctant to participate in activity progression; continues to experience severe pain requiring higher than usual doses of medication.

## 2020-05-15 NOTE — Anesthesia Post-op Follow-up Note (Signed)
  Anesthesia Pain Follow-up Note  Patient: Angel Lane  Day #: 1  Date of Follow-up: 05/15/2020 Time: 2:54 PM  Last Vitals:  Vitals:   05/15/20 1200 05/15/20 1300  BP: (!) 95/49 (!) 94/54  Pulse: 67 70  Resp: 14 (!) 28  Temp:    SpO2: 93% 93%    Level of Consciousness: alert  Pain: moderate   Side Effects:None  Catheter Site Exam:clean, dry, no drainage     Plan: Continue Lumbar drain at surgeons request.  FITZGERALD,W. EDMOND

## 2020-05-16 DIAGNOSIS — I7101 Dissection of thoracic aorta: Secondary | ICD-10-CM | POA: Diagnosis not present

## 2020-05-16 DIAGNOSIS — I161 Hypertensive emergency: Secondary | ICD-10-CM | POA: Diagnosis not present

## 2020-05-16 LAB — BASIC METABOLIC PANEL
Anion gap: 3 — ABNORMAL LOW (ref 5–15)
BUN: 8 mg/dL (ref 8–23)
CO2: 25 mmol/L (ref 22–32)
Calcium: 7.4 mg/dL — ABNORMAL LOW (ref 8.9–10.3)
Chloride: 107 mmol/L (ref 98–111)
Creatinine, Ser: 0.56 mg/dL (ref 0.44–1.00)
GFR, Estimated: >60 mL/min (ref >60)
Glucose, Bld: 123 mg/dL — ABNORMAL HIGH (ref 70–99)
Potassium: 3.6 mmol/L (ref 3.5–5.1)
Sodium: 135 mmol/L (ref 135–145)

## 2020-05-16 LAB — CBC
HCT: 22.4 % — ABNORMAL LOW (ref 36.0–46.0)
Hemoglobin: 7.6 g/dL — ABNORMAL LOW (ref 12.0–15.0)
MCH: 31 pg (ref 26.0–34.0)
MCHC: 33.9 g/dL (ref 30.0–36.0)
MCV: 91.4 fL (ref 80.0–100.0)
Platelets: 180 10*3/uL (ref 150–400)
RBC: 2.45 MIL/uL — ABNORMAL LOW (ref 3.87–5.11)
RDW: 13.2 % (ref 11.5–15.5)
WBC: 9.3 10*3/uL (ref 4.0–10.5)
nRBC: 0 % (ref 0.0–0.2)

## 2020-05-16 LAB — PREPARE RBC (CROSSMATCH)

## 2020-05-16 LAB — MAGNESIUM: Magnesium: 2.1 mg/dL (ref 1.7–2.4)

## 2020-05-16 LAB — HEMOGLOBIN AND HEMATOCRIT, BLOOD
HCT: 31 % — ABNORMAL LOW (ref 36.0–46.0)
Hemoglobin: 10.1 g/dL — ABNORMAL LOW (ref 12.0–15.0)

## 2020-05-16 MED ORDER — HEPARIN SODIUM (PORCINE) 5000 UNIT/ML IJ SOLN
5000.0000 [IU] | Freq: Three times a day (TID) | INTRAMUSCULAR | Status: DC
  Administered 2020-05-17 – 2020-05-21 (×14): 5000 [IU] via SUBCUTANEOUS
  Filled 2020-05-16 (×14): qty 1

## 2020-05-16 MED ORDER — POTASSIUM CHLORIDE CRYS ER 20 MEQ PO TBCR
40.0000 meq | EXTENDED_RELEASE_TABLET | Freq: Once | ORAL | Status: AC
  Administered 2020-05-16: 40 meq via ORAL
  Filled 2020-05-16: qty 2

## 2020-05-16 MED ORDER — SODIUM CHLORIDE 0.9% FLUSH
10.0000 mL | Freq: Two times a day (BID) | INTRAVENOUS | Status: DC
  Administered 2020-05-16: 20 mL
  Administered 2020-05-16 – 2020-05-20 (×4): 10 mL

## 2020-05-16 MED ORDER — FUROSEMIDE 10 MG/ML IJ SOLN
20.0000 mg | Freq: Once | INTRAMUSCULAR | Status: AC
  Administered 2020-05-16: 20 mg via INTRAVENOUS
  Filled 2020-05-16: qty 2

## 2020-05-16 MED ORDER — SODIUM CHLORIDE 0.9% FLUSH
10.0000 mL | INTRAVENOUS | Status: DC | PRN
Start: 2020-05-16 — End: 2020-05-21
  Administered 2020-05-20: 10 mL

## 2020-05-16 MED ORDER — HEPARIN SODIUM (PORCINE) 5000 UNIT/ML IJ SOLN: 5000.0000 [IU] | Freq: Three times a day (TID) | INTRAMUSCULAR | Status: DC

## 2020-05-16 MED ORDER — SODIUM CHLORIDE 0.9 % IV BOLUS
1000.0000 mL | Freq: Once | INTRAVENOUS | Status: AC
  Administered 2020-05-16: 1000 mL via INTRAVENOUS

## 2020-05-16 MED ORDER — CARVEDILOL 6.25 MG PO TABS
6.2500 mg | ORAL_TABLET | Freq: Two times a day (BID) | ORAL | Status: DC
  Administered 2020-05-16 – 2020-05-21 (×10): 6.25 mg via ORAL
  Filled 2020-05-16 (×10): qty 1

## 2020-05-16 NOTE — Progress Notes (Signed)
Assessed pt for PIV.  Recommend to Terie Purser RN and pt to agree to PICC placement. Husband also at bedside. Pt adamantly refuses PICC placement "I do not want anything near my heart".  Pt agreeable to midline placement. Placed with difficulty due to pts veins.  RN aware it is in place and ready for use.  Reinforced to pt to consider PICC placement if loses IV access again if MD is willing to order.

## 2020-05-16 NOTE — Progress Notes (Signed)
    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  Will hold Norvasc and HCTZ, and cut Coreg to 6.35 Heme:  Hb dropped and with relative hypotension, will transfuse 2 units pRBC Renal:  UOP dropped overnight.  Creatinine remains normal.  Will get lasix with blood ID:  No issues Neuro:  Lumbar drain out last night.  No signs of neuro deficit GI:  Advance diet Prophylaxis:  Start SQ heparin, SCD's and protonix  Wells Brabham 05/16/2020 8:01 AM --  Vitals:   05/16/20 0600 05/16/20 0700  BP: 102/64 114/63  Pulse: 74 72  Resp: 20 (!) 25  Temp:    SpO2: 96% 94%    Intake/Output Summary (Last 24 hours) at 05/16/2020 0801 Last data filed at 05/16/2020 0600 Gross per 24 hour  Intake 2886.43 ml  Output 515 ml  Net 2371.43 ml     Laboratory CBC    Component Value Date/Time   WBC 9.3 05/16/2020 0456   HGB 7.6 (L) 05/16/2020 0456   HGB 13.4 02/19/2020 1604   HCT 22.4 (L) 05/16/2020 0456   HCT 40.8 02/19/2020 1604   PLT 180 05/16/2020 0456   PLT 200 02/19/2020 1604    BMET    Component Value Date/Time   NA 135 05/16/2020 0456   NA 141 02/19/2020 1604   NA 141 10/29/2011 1553   K 3.6 05/16/2020 0456   K 3.3 (L) 10/29/2011 1553   CL 107 05/16/2020 0456   CL 108 (H) 10/29/2011 1553   CO2 25 05/16/2020 0456   CO2 26 10/29/2011 1553   GLUCOSE 123 (H) 05/16/2020 0456   GLUCOSE 104 (H) 10/29/2011 1553   BUN 8 05/16/2020 0456   BUN 17 02/19/2020 1604   BUN 18 10/29/2011 1553   CREATININE 0.56 05/16/2020 0456   CREATININE 0.87 10/29/2011 1553    CALCIUM 7.4 (L) 05/16/2020 0456   CALCIUM 9.1 10/29/2011 1553   GFRNONAA >60 05/16/2020 0456   GFRNONAA >60 10/29/2011 1553   GFRAA 78 02/19/2020 1604   GFRAA >60 10/29/2011 1553    COAG Lab Results  Component Value Date   INR 1.5 (H) 05/14/2020   INR 0.96 01/05/2017   No results found for: PTT  Antibiotics Anti-infectives (From admission, onward)   Start     Dose/Rate Route Frequency Ordered Stop   05/14/20 2030  ceFAZolin (ANCEF) IVPB 2g/100 mL premix        2 g 200 mL/hr over 30 Minutes Intravenous Every 8 hours 05/14/20 2013 05/15/20 0425   05/11/20 1000  cefdinir (OMNICEF) capsule 300 mg        300 mg Oral 2 times daily 05/11/20 0411 05/12/20 2142       V. Leia Alf, M.D., [facility] [place] Office: [phone] Pager:  [phone]

## 2020-05-16 NOTE — Plan of Care (Signed)
  Problem: Clinical Measurements: Goal: Will remain free from infection Outcome: Progressing Goal: Diagnostic test results will improve Outcome: Progressing Goal: Respiratory complications will improve Outcome: Progressing Goal: Cardiovascular complication will be avoided Outcome: Progressing   Problem: Coping: Goal: Level of anxiety will decrease Outcome: Progressing   Problem: Pain Managment: Goal: General experience of comfort will improve Outcome: Progressing   Problem: Clinical Measurements: Goal: Postoperative complications will be avoided or minimized Outcome: Progressing   Problem: Skin Integrity: Goal: Demonstration of wound healing without infection will improve Outcome: Progressing   Problem: Urinary Elimination: Goal: Ability to achieve and maintain adequate renal perfusion and functioning will improve Outcome: Progressing

## 2020-05-16 NOTE — Progress Notes (Signed)
Progress Note  [name] Date of Encounter: 05/16/2020  [facility] Cardiologist: Skeet Latch, MD   Subjective   Patient states her back hurts from sleeping in the bed. Otherwise, doing well. No chest pain, lightheadedness or SOB. Drains in place.  POD #2 from bilateral carotid subclavian transposition and TEVAR for dissection. Blood pressures 100-110s/50-60s  Inpatient Medications    Scheduled Meds: . amLODipine  5 mg Oral Q24H  . carvedilol  25 mg Oral BID WC  . Chlorhexidine Gluconate Cloth  6 each Topical Daily  . docusate sodium  100 mg Oral BID  . hydrochlorothiazide  12.5 mg Oral Daily  . mouth rinse  15 mL Mouth Rinse BID  . pantoprazole  40 mg Oral Daily  . rosuvastatin  5 mg Oral Daily  . sodium chloride flush  10-40 mL Intracatheter Q12H   Continuous Infusions: . clevidipine Stopped (05/11/20 1900)  . dextrose 5 % and 0.9% NaCl Stopped (05/15/20 1426)  . esmolol Stopped (05/11/20 0417)  . magnesium sulfate bolus IVPB     PRN Meds: acetaminophen **OR** acetaminophen, alum & mag hydroxide-simeth, guaiFENesin-dextromethorphan, hydrALAZINE, labetalol, magnesium sulfate bolus IVPB, metoprolol tartrate, morphine injection, ondansetron, oxyCODONE, oxyCODONE-acetaminophen, phenol, potassium chloride, senna-docusate, sodium chloride flush, white petrolatum   Vital Signs    Vitals:   05/16/20 0400 05/16/20 0500 05/16/20 0600 05/16/20 0700  BP: (!) 106/59 110/62 102/64 114/63  Pulse: 72 74 74 72  Resp: (!) 22 (!) 21 20 (!) 25  Temp: 98.1 F (36.7 C)     TempSrc: Oral     SpO2: 96% 100% 96% 94%  Weight:      Height:        Intake/Output Summary (Last 24 hours) at 05/16/2020 0733 Last data filed at 05/16/2020 0600 Gross per 24 hour  Intake 3045.74 ml  Output 627 ml  Net 2418.74 ml   Last 3 Weights 05/14/2020 05/14/2020 05/13/2020  Weight (lbs) 192 lb 0.3 oz 197 lb 9.6 oz 198 lb 6.6 oz  Weight (kg) 87.1 kg 89.631 kg 90 kg       Telemetry    NSR - Personally Reviewed  ECG    No new tracing - Personally Reviewed  Physical Exam   GEN: No acute distress.   Neck: Incision sites c/d/i Cardiac: RRR, no murmurs, rubs, or gallops. Drains in place Respiratory: Clear to auscultation bilaterally. GI: Soft, nontender, non-distended  MS: No edema; No deformity. Neuro:  Nonfocal  Psych: Normal affect   Labs    High Sensitivity Troponin:   Recent Labs  Lab 05/01/20 1610 05/01/20 1801 05/10/20 1001 05/10/20 1230  TROPONINIHS 5 9 4 5       Chemistry Recent Labs  Lab 05/10/20 1001 05/11/20 0553 05/12/20 0351 05/14/20 1930 05/15/20 0340 05/16/20 0456  NA 135 130*   < > 133* 136 135  K 3.2* 3.9   < > 3.6 3.2* 3.6  CL 97* 98   < > 99 104 107  CO2 27 24   < > 23 26 25   GLUCOSE 121* 168*   < > 151* 167* 123*  BUN 15 13   < > 12 9 8   CREATININE 0.64 0.75   < > 0.82 0.67 0.56  CALCIUM 9.1 8.5*   < > 8.2* 8.0* 7.4*  PROT 7.0 6.0*  --   --   --   --   ALBUMIN 3.1* 2.4*  --   --   --   --   AST  21 21  --   --   --   --   ALT 19 18  --   --   --   --   ALKPHOS 47 43  --   --   --   --   BILITOT 1.0 1.1  --   --   --   --   GFRNONAA >60 >60   < > >60 >60 >60  ANIONGAP 11 8   < > 11 6 3*   < > = values in this interval not displayed.     Hematology Recent Labs  Lab 05/14/20 1930 05/15/20 0340 05/16/20 0456  WBC 8.1 6.9 9.3  RBC 3.05* 2.80* 2.45*  HGB 9.2* 8.3* 7.6*  HCT 27.9* 25.5* 22.4*  MCV 91.5 91.1 91.4  MCH 30.2 29.6 31.0  MCHC 33.0 32.5 33.9  RDW 13.2 13.3 13.2  PLT 200 190 180    BNPNo results for input(s): BNP, PROBNP in the last 168 hours.   DDimer No results for input(s): DDIMER in the last 168 hours.   Radiology    DG Chest Port 1 View  Result Date: 05/14/2020 CLINICAL DATA:  Postop. EXAM: PORTABLE CHEST 1 VIEW COMPARISON:  Preoperative radiograph 05/10/2020 FINDINGS: Interval placement of descending thoracic aortic stent graft. Mild cardiomegaly. Small left pleural effusion  and basilar atelectasis, slightly increased from prior exam. No pneumothorax. Lower lung volumes with bronchovascular crowding. Presumed external artifact projecting over the right hemithorax extending beyond the shoulder. IMPRESSION: 1. Interval placement of descending thoracic aortic stent graft. 2. Small left pleural effusion and basilar atelectasis, slightly increased from prior exam. 3. Low lung volumes. Mild cardiomegaly likely accentuated by technique. Electronically Signed   By: Keith Rake M.D.   On: 05/14/2020 19:53   HYBRID OR IMAGING ([facility] ONLY)  Result Date: 05/14/2020 There is no interpretation for this exam.  This order is for images obtained during a surgical procedure.  Please See "Surgeries" Tab for more information regarding the procedure.    Cardiac Studies   Echo 05/02/20: 1. Left ventricular ejection fraction, by estimation, is 60 to 65%. The  left ventricle has normal function. The left ventricle has no regional  wall motion abnormalities. Left ventricular diastolic parameters are  consistent with Grade I diastolic  dysfunction (impaired relaxation). Elevated left atrial pressure.  2. Right ventricular systolic function is normal. The right ventricular  size is normal.  3. The mitral valve is normal in structure. No evidence of mitral valve  regurgitation. No evidence of mitral stenosis.  4. The aortic valve is tricuspid. Aortic valve regurgitation is moderate.  5. Descending aorta not well visualized.  6. The inferior vena cava is normal in size with greater than 50%  respiratory variability, suggesting right atrial pressure of 3 mmHg.   Patient Profile     70 y.o. female coronary calcification, hypertension, prior stroke, tobacco abuse, and anemia with recent hospitalization for type B aortic dissection and hypertensive emergency  Assessment & Plan    # Hypertensive emergency: # Type B aortic dissection:  Now s/p bilateral carotid subclavian  transposition and TEVAR for dissection with CV surgery. Doing well with blood pressures well controlled. Currently on amlodipine and coreg. Prior to her last hospitalization she was only on metoprolol and hydrochlorothiazide. In the hospital she has had significant hypokalemia. This raises the question of hyperaldosteronism. Renin/aldo pending.  However on review of her CT scan she has no adrenal adenomas.  Will transition HCTZ to spironolactone prior to discharge.  Secondary Causes of Hypertension  Medications/Herbal: OCP, steroids, stimulants, antidepressants, weight loss medication, immune suppressants, NSAIDs, sympathomimetics, alcohol, caffeine, licorice, ginseng, St. John's wort, chemo (none identified)  Sleep Apnea: Resume CPAP Renal artery stenosis: None on CT-A 05/2020.  Renin/aldo pending Hyperaldosteronism: No adenomas on CT 05/2020 Hyper/hypothyroidism: TSH normal 02/2020 Pheochromocytoma: (testing not indicated)  Cushing's syndrome: (testing not indicated)  Coarctation of the aorta: none on CT 05/2020  # Coronary calcification:  # Hyperlipidemia: Noted on CT. Her chest pain is likely related to her Type B aortic dissection. Resolved post-procedure. LDL was 105 02/2020. Increased rosuvastatin to 20mg  and repeat lipids/CMP in 6-8 weeks.   # Tobacco abuse: Continue to encourage cessation.       For questions or updates, please contact [place] Please consult [email or web address] for contact info under        Signed, Freada Bergeron, MD  05/16/2020, 7:33 AM

## 2020-05-16 NOTE — Progress Notes (Signed)
Pt refuses CPAP for tonight.   

## 2020-05-17 ENCOUNTER — Encounter (HOSPITAL_COMMUNITY): Payer: Self-pay | Admitting: Vascular Surgery

## 2020-05-17 DIAGNOSIS — I2584 Coronary atherosclerosis due to calcified coronary lesion: Secondary | ICD-10-CM | POA: Diagnosis not present

## 2020-05-17 DIAGNOSIS — I7101 Dissection of thoracic aorta: Secondary | ICD-10-CM | POA: Diagnosis not present

## 2020-05-17 DIAGNOSIS — I161 Hypertensive emergency: Secondary | ICD-10-CM | POA: Diagnosis not present

## 2020-05-17 DIAGNOSIS — I2510 Atherosclerotic heart disease of native coronary artery without angina pectoris: Secondary | ICD-10-CM | POA: Diagnosis not present

## 2020-05-17 LAB — CBC
HCT: 33.5 % — ABNORMAL LOW (ref 36.0–46.0)
Hemoglobin: 11 g/dL — ABNORMAL LOW (ref 12.0–15.0)
MCH: 29.3 pg (ref 26.0–34.0)
MCHC: 32.8 g/dL (ref 30.0–36.0)
MCV: 89.3 fL (ref 80.0–100.0)
Platelets: 201 10*3/uL (ref 150–400)
RBC: 3.75 MIL/uL — ABNORMAL LOW (ref 3.87–5.11)
RDW: 15 % (ref 11.5–15.5)
WBC: 10 10*3/uL (ref 4.0–10.5)
nRBC: 0 % (ref 0.0–0.2)

## 2020-05-17 LAB — BPAM RBC
Blood Product Expiration Date: 202204082359
Blood Product Expiration Date: 202204082359
Blood Product Expiration Date: 202204122359
Blood Product Expiration Date: 202204122359
ISSUE DATE / TIME: 202203111611
ISSUE DATE / TIME: 202203111611
ISSUE DATE / TIME: 202203130917
ISSUE DATE / TIME: 202203131250
Unit Type and Rh: 5100
Unit Type and Rh: 5100
Unit Type and Rh: 5100
Unit Type and Rh: 5100

## 2020-05-17 LAB — TYPE AND SCREEN
ABO/RH(D): O POS
Antibody Screen: NEGATIVE
Unit division: 0
Unit division: 0
Unit division: 0
Unit division: 0

## 2020-05-17 LAB — BASIC METABOLIC PANEL
Anion gap: 10 (ref 5–15)
BUN: 9 mg/dL (ref 8–23)
CO2: 24 mmol/L (ref 22–32)
Calcium: 8.5 mg/dL — ABNORMAL LOW (ref 8.9–10.3)
Chloride: 104 mmol/L (ref 98–111)
Creatinine, Ser: 0.6 mg/dL (ref 0.44–1.00)
GFR, Estimated: >60 mL/min (ref >60)
Glucose, Bld: 112 mg/dL — ABNORMAL HIGH (ref 70–99)
Potassium: 3.9 mmol/L (ref 3.5–5.1)
Sodium: 138 mmol/L (ref 135–145)

## 2020-05-17 MED ORDER — SODIUM CHLORIDE 0.9 % IV SOLN: INTRAVENOUS | Status: DC

## 2020-05-17 MED ORDER — SODIUM CHLORIDE 0.9 % IV BOLUS
1000.0000 mL | Freq: Once | INTRAVENOUS | Status: AC
  Administered 2020-05-17: 1000 mL via INTRAVENOUS

## 2020-05-17 NOTE — Progress Notes (Signed)
Patient experiencing decreasing urine output over the course of shift. Dropping from approximately 20-30 ml per hour to 6 ml in the last hour. MAPs have read between 66 and 77, and BUN and Creatinine are WNL. Received and implemented order for a 1L bolus of NS, and increase in rate of maintenance fluids to 50 ml per hour.

## 2020-05-17 NOTE — Progress Notes (Signed)
Progress Note  [name] Date of Encounter: 05/17/2020  [facility] Cardiologist: Skeet Latch, MD   Subjective   Denies any chest pain or SOB  POD #3 from bilateral carotid subclavian transposition and TEVAR for dissection. BP remains stable at 107/35mmHg  Inpatient Medications    Scheduled Meds: . carvedilol  6.25 mg Oral BID WC  . Chlorhexidine Gluconate Cloth  6 each Topical Daily  . docusate sodium  100 mg Oral BID  . heparin injection (subcutaneous)  5,000 Units Subcutaneous Q8H  . mouth rinse  15 mL Mouth Rinse BID  . pantoprazole  40 mg Oral Daily  . rosuvastatin  5 mg Oral Daily  . sodium chloride flush  10-40 mL Intracatheter Q12H   Continuous Infusions: . sodium chloride 50 mL/hr at 05/17/20 0553  . dextrose 5 % and 0.9% NaCl Stopped (05/15/20 1426)  . magnesium sulfate bolus IVPB     PRN Meds: acetaminophen **OR** acetaminophen, alum & mag hydroxide-simeth, guaiFENesin-dextromethorphan, hydrALAZINE, labetalol, magnesium sulfate bolus IVPB, metoprolol tartrate, morphine injection, ondansetron, oxyCODONE, oxyCODONE-acetaminophen, phenol, potassium chloride, senna-docusate, sodium chloride flush, white petrolatum   Vital Signs    Vitals:   05/17/20 0530 05/17/20 0600 05/17/20 0630 05/17/20 0746  BP:  (!) 94/55    Pulse: (!) 126 81 75   Resp: (!) 21 (!) 25 (!) 24   Temp:    99.2 F (37.3 C)  TempSrc:    Oral  SpO2: (!) 42% 99% 98%   Weight: 89 kg     Height:        Intake/Output Summary (Last 24 hours) at 05/17/2020 9678 Last data filed at 05/17/2020 0700 Gross per 24 hour  Intake 910 ml  Output 1311 ml  Net -401 ml   Last 3 Weights 05/17/2020 05/14/2020 05/14/2020  Weight (lbs) 196 lb 3.4 oz 192 lb 0.3 oz 197 lb 9.6 oz  Weight (kg) 89 kg 87.1 kg 89.631 kg      Telemetry    NSR - Personally Reviewed  ECG    No new EKG to review - Personally Reviewed  Physical Exam   GEN: Well nourished, well developed in no acute  distress HEENT: Normal NECK: No JVD; No carotid bruits LYMPHATICS: No lymphadenopathy CARDIAC:RRR, no murmurs, rubs, gallops RESPIRATORY:  Clear to auscultation without rales, wheezing or rhonchi  ABDOMEN: Soft, non-tender, non-distended MUSCULOSKELETAL:  No edema; No deformity  SKIN: Warm and dry NEUROLOGIC:  Alert and oriented x 3 PSYCHIATRIC:  Normal affect    Labs    High Sensitivity Troponin:   Recent Labs  Lab 05/01/20 1610 05/01/20 1801 05/10/20 1001 05/10/20 1230  TROPONINIHS 5 9 4 5       Chemistry Recent Labs  Lab 05/10/20 1001 05/11/20 0553 05/12/20 0351 05/15/20 0340 05/16/20 0456 05/17/20 0116  NA 135 130*   < > 136 135 138  K 3.2* 3.9   < > 3.2* 3.6 3.9  CL 97* 98   < > 104 107 104  CO2 27 24   < > 26 25 24   GLUCOSE 121* 168*   < > 167* 123* 112*  BUN 15 13   < > 9 8 9   CREATININE 0.64 0.75   < > 0.67 0.56 0.60  CALCIUM 9.1 8.5*   < > 8.0* 7.4* 8.5*  PROT 7.0 6.0*  --   --   --   --   ALBUMIN 3.1* 2.4*  --   --   --   --  AST 21 21  --   --   --   --   ALT 19 18  --   --   --   --   ALKPHOS 47 43  --   --   --   --   BILITOT 1.0 1.1  --   --   --   --   GFRNONAA >60 >60   < > >60 >60 >60  ANIONGAP 11 8   < > 6 3* 10   < > = values in this interval not displayed.     Hematology Recent Labs  Lab 05/15/20 0340 05/16/20 0456 05/16/20 1743 05/17/20 0116  WBC 6.9 9.3  --  10.0  RBC 2.80* 2.45*  --  3.75*  HGB 8.3* 7.6* 10.1* 11.0*  HCT 25.5* 22.4* 31.0* 33.5*  MCV 91.1 91.4  --  89.3  MCH 29.6 31.0  --  29.3  MCHC 32.5 33.9  --  32.8  RDW 13.3 13.2  --  15.0  PLT 190 180  --  201    BNPNo results for input(s): BNP, PROBNP in the last 168 hours.   DDimer No results for input(s): DDIMER in the last 168 hours.   Radiology    No results found.  Cardiac Studies   Echo 05/02/20: 1. Left ventricular ejection fraction, by estimation, is 60 to 65%. The  left ventricle has normal function. The left ventricle has no regional  wall  motion abnormalities. Left ventricular diastolic parameters are  consistent with Grade I diastolic  dysfunction (impaired relaxation). Elevated left atrial pressure.  2. Right ventricular systolic function is normal. The right ventricular  size is normal.  3. The mitral valve is normal in structure. No evidence of mitral valve  regurgitation. No evidence of mitral stenosis.  4. The aortic valve is tricuspid. Aortic valve regurgitation is moderate.  5. Descending aorta not well visualized.  6. The inferior vena cava is normal in size with greater than 50%  respiratory variability, suggesting right atrial pressure of 3 mmHg.   Patient Profile     70 y.o. female coronary calcification, hypertension, prior stroke, tobacco abuse, and anemia with recent hospitalization for type B aortic dissection and hypertensive emergency  Assessment & Plan    # Hypertensive emergency: # Type B aortic dissection:  -Now s/p bilateral carotid subclavian transposition and TEVAR for dissection with CV surgery.  -BP remains very well controlled at 107/25mmHg -Currently coreg 6.25mg  BID.  -Prior to her last hospitalization she was only on metoprolol and hydrochlorothiazide but in the hospital she has had significant hypokalemia.  -This raises the question of hyperaldosteronism. Renin/aldo pending.    -CT scan she has no adrenal adenomas.   -currently diuretics on hold due to soft BP but plan was to transition HCTZ to spiro at discharge  Secondary Causes of Hypertension  Medications/Herbal: OCP, steroids, stimulants, antidepressants, weight loss medication, immune suppressants, NSAIDs, sympathomimetics, alcohol, caffeine, licorice, ginseng, St. John's wort, chemo (none identified)  Sleep Apnea: Resume CPAP Renal artery stenosis: None on CT-A 05/2020.  Renin/aldo pending Hyperaldosteronism: No adenomas on CT 05/2020 Hyper/hypothyroidism: TSH normal 02/2020 Pheochromocytoma: (testing not  indicated)  Cushing's syndrome: (testing not indicated)  Coarctation of the aorta: none on CT 05/2020  # Coronary calcification:  # Hyperlipidemia: -Coronary Ca Noted on CT.  -Her chest pain is likely related to her Type B aortic dissection and resolved post-procedure.  -LDL was 105 02/2020.  -Increased rosuvastatin to 20mg  daily -repeat FLP and ALT  in 6 weeks  # Tobacco abuse: Continue to encourage cessation.   She is doing well.  Will follow at a distance and once ready for discharge will make final recommendations on D/C meds  I have spent a total of 35 minutes with patient reviewing 2D echo , telemetry, EKGs, labs and examining patient as well as establishing an assessment and plan that was discussed with the patient.  > 50% of time was spent in direct patient care.        For questions or updates, please contact [place] Please consult [email or web address] for contact info under        Signed, Fransico Him, MD  05/17/2020, 9:03 AM

## 2020-05-17 NOTE — Progress Notes (Addendum)
[facility] [place]  Subjective  - Pain issues B subclavian and groins.  She states she feels dizzy when she gets up.   Objective (!) 94/55 75 99.4 F (37.4 C) (Oral) (!) 24 98%  Intake/Output Summary (Last 24 hours) at 05/17/2020 0708 Last data filed at 05/17/2020 0700 Gross per 24 hour  Intake 910 ml  Output 1396 ml  Net -486 ml   B groins soft without hematoma B subclavian incisions healing well without hematoma. JP's 35 cc output SS Moving all ext.  Palpable radial and pedal pulses. BP hypotensive systolic 40-981, received bolus 1,00 cc NS at 5 am. Lungs non labored breathing Heart Tachy with movement   Assessment/Planning: POD # 3 s/p Bilateral carotid subclavian transposition and TEVAR for dissection  Urine Op 1300, Cr 0.6 may D/C foley Drains SS 35 cc OP.  Not sure if there was lymph leak will wait for Dr. Oneida Alar to decide about drains being D/C'd Mobility, pain control, watch BP.  HGB 11.0 stable A & O x 3  Angel Lane 05/17/2020 7:08 AM --  Acute blood loss anemia improved post transfusion Renal function stable Palpable DP and radial pulses D/c foley Transfer to 4E Advance diet Ambulate  Home next one to 2 days Keep drains for today most likely remove in am if output less than 30 cc  Angel Hinds, MD [facility] [place]: [phone]  Laboratory Lab Results: Recent Labs    05/16/20 0456 05/16/20 1743 05/17/20 0116  WBC 9.3  --  10.0  HGB 7.6* 10.1* 11.0*  HCT 22.4* 31.0* 33.5*  PLT 180  --  201   BMET Recent Labs    05/16/20 0456 05/17/20 0116  NA 135 138  K 3.6 3.9  CL 107 104  CO2 25 24  GLUCOSE 123* 112*  BUN 8 9  CREATININE 0.56 0.60  CALCIUM 7.4* 8.5*    COAG Lab Results  Component Value Date   INR 1.5 (H) 05/14/2020   INR 0.96 01/05/2017   No results found for: PTT

## 2020-05-17 NOTE — Progress Notes (Signed)
Patient declined CPAP for the night. Patient on 2lpm with Sp02=99%.

## 2020-05-17 NOTE — Evaluation (Signed)
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 PMhx: HTN, CVA, anemia  Clinical Impression  Pt pleasant and able to transition to standing without orthostatic hypotension this session and progress to hall ambulation. Pt educated for RW use, increased mobility and OOB for toileting and meals. Pt with decreased activity tolerance, mobility and function who will benefit from acute therapy to maximize independence prior to return home.   Orthostatic BPs  Supine 102/51  Sitting 109/76     Standing 103/62          Follow Up Recommendations Home health PT    Equipment Recommendations  None recommended by PT    Recommendations for Other Services       Precautions / Restrictions Precautions Precautions: Fall Precaution Comments: bil Jp drains      Mobility  Bed Mobility Overal bed mobility: Needs Assistance       Supine to sit: Supervision     General bed mobility comments: cues with increased time and effort to roll toward right and elevate trunk from sitting    Transfers Overall transfer level: Needs assistance   Transfers: Sit to/from Stand Sit to Stand: Supervision         General transfer comment: cues for hand placement and safety to stand from bed, pivot to bSC and stand from [facility]  Ambulation/Gait Ambulation/Gait assistance: Min guard Gait Distance (Feet): 120 Feet Assistive device: Rolling walker (2 wheeled) Gait Pattern/deviations: Step-through pattern;Decreased stride length   Gait velocity interpretation: 1.31 - 2.62 ft/sec, indicative of limited community ambulator General Gait Details: limited by fatigue with cues for proximity to RW and directional cues  Stairs            Wheelchair Mobility    Modified Rankin  (Stroke Patients Only)       Balance Overall balance assessment: Mild deficits observed, not formally tested                                           Pertinent Vitals/Pain Pain Assessment: No/denies pain    Home Living Family/patient expects to be discharged to:: Private residence Living Arrangements: Spouse/significant other Available Help at Discharge: Available 24 hours/day Type of Home: House Home Access: Stairs to enter Entrance Stairs-Rails: Psychiatric nurse of Steps: 2 Home Layout: One level Home Equipment: Bedside commode;Shower seat;Walker - 2 wheels;Hand held shower head      Prior Function Level of Independence: Independent               Hand Dominance        Extremity/Trunk Assessment   Upper Extremity Assessment Upper Extremity Assessment: Overall WFL for tasks assessed    Lower Extremity Assessment Lower Extremity Assessment: Overall WFL for tasks assessed    Cervical / Trunk Assessment Cervical / Trunk Assessment: Normal  Communication      Cognition Arousal/Alertness: Awake/alert Behavior During Therapy: WFL for tasks assessed/performed Overall Cognitive Status: Within Functional Limits for tasks assessed                                        General Comments  Exercises     Assessment/Plan    PT Assessment Patient needs continued PT services  PT Problem List Decreased strength;Decreased balance;Decreased mobility;Decreased activity tolerance;Cardiopulmonary status limiting activity;Decreased knowledge of use of DME       PT Treatment Interventions DME instruction;Gait training;Functional mobility training;Stair training;Therapeutic activities;Therapeutic exercise;Balance training;Patient/family education    PT Goals (Current goals can be found in the Care Plan section)  Acute Rehab PT Goals Patient Stated Goal: return home and stay there PT Goal Formulation: With  patient Time For Goal Achievement: 05/31/20 Potential to Achieve Goals: Good    Frequency Min 3X/week   Barriers to discharge        Co-evaluation               AM-PAC PT "6 Clicks" Mobility  Outcome Measure Help needed turning from your back to your side while in a flat bed without using bedrails?: None Help needed moving from lying on your back to sitting on the side of a flat bed without using bedrails?: A Little Help needed moving to and from a bed to a chair (including a wheelchair)?: A Little Help needed standing up from a chair using your arms (e.g., wheelchair or bedside chair)?: A Little Help needed to walk in hospital room?: A Little Help needed climbing 3-5 steps with a railing? : A Little 6 Click Score: 19    End of Session Equipment Utilized During Treatment: Gait belt Activity Tolerance: Patient tolerated treatment well Patient left: with call bell/phone within reach;with nursing/sitter in room;in chair Nurse Communication: Mobility status PT Visit Diagnosis: Other abnormalities of gait and mobility (R26.89)    Time: 2707-8675 PT Time Calculation (min) (ACUTE ONLY): 33 min   Charges:   PT Evaluation $PT Eval Moderate Complexity: 1 Mod PT Treatments $Gait Training: 8-22 mins        [place] Pager: [phone] Office: [place] 05/17/2020, 1:21 PM

## 2020-05-18 ENCOUNTER — Inpatient Hospital Stay (HOSPITAL_COMMUNITY): Payer: Medicare Other

## 2020-05-18 LAB — POCT I-STAT 7, (LYTES, BLD GAS, ICA,H+H)
Acid-Base Excess: 4 mmol/L — ABNORMAL HIGH (ref 0.0–2.0)
Bicarbonate: 27.1 mmol/L (ref 20.0–28.0)
Calcium, Ion: 1.24 mmol/L (ref 1.15–1.40)
HCT: 32 % — ABNORMAL LOW (ref 36.0–46.0)
Hemoglobin: 10.9 g/dL — ABNORMAL LOW (ref 12.0–15.0)
O2 Saturation: 86 %
Patient temperature: 99.2
Potassium: 3.4 mmol/L — ABNORMAL LOW (ref 3.5–5.1)
Sodium: 138 mmol/L (ref 135–145)
TCO2: 28 mmol/L (ref 22–32)
pCO2 arterial: 36.9 mmHg (ref 32.0–48.0)
pH, Arterial: 7.475 — ABNORMAL HIGH (ref 7.350–7.450)
pO2, Arterial: 49 mmHg — ABNORMAL LOW (ref 83.0–108.0)

## 2020-05-18 MED ORDER — NICOTINE 21 MG/24HR TD PT24
21.0000 mg | MEDICATED_PATCH | Freq: Every day | TRANSDERMAL | Status: DC
  Administered 2020-05-18 – 2020-05-21 (×4): 21 mg via TRANSDERMAL
  Filled 2020-05-18 (×4): qty 1

## 2020-05-18 NOTE — Progress Notes (Addendum)
[facility] [place]  Subjective  - Doing a little better and walks some.   Objective (!) 125/58 69 99 F (37.2 C) (Oral) (!) 26 94%  Intake/Output Summary (Last 24 hours) at 05/18/2020 0725 Last data filed at 05/18/2020 0700 Gross per 24 hour  Intake 1088.48 ml  Output 400 ml  Net 688.48 ml    JP drains 10 cc Op will d/c Palpable radial and pedal pulses Subclavian incisions healing well without hematoma Right groin incision healing well BP stable 125/58 Urine OP decreased 390 last 24, IV fluids @ 50 cc/hr.  PO intake good. Lungs non labored breathing   Assessment/Planning: POD # 4 s/p Bilateral carotid subclavian transposition and TEVAR for dissection  Plan of care pain control and mobility D/C JP drains BP stable Transfer to 4E Peri wick in place not reliable for urine Op NS @ 50 will cont. CBC and BMET will be ordered for tomorrow am  Nicotine patch ordered.  [name] 05/18/2020 7:25 AM --  Agree with above. 2+ radial 2+ DP Incisions healing  Continue to ambulate desats with exertion similar to prehospital May need home O2.  Will check chest xray today to rule out pneumonia. Will also check room air ABG for home O2 assessment Encourage PO intake Home next one to 2 days A.M labs  [name], MD [facility] [place]: [phone]    Laboratory Lab Results: Recent Labs    05/16/20 0456 05/16/20 1743 05/17/20 0116  WBC 9.3  --  10.0  HGB 7.6* 10.1* 11.0*  HCT 22.4* 31.0* 33.5*  PLT 180  --  201   BMET Recent Labs    05/16/20 0456 05/17/20 0116  NA 135 138  K 3.6 3.9  CL 107 104  CO2 25 24  GLUCOSE 123* 112*  BUN 8 9  CREATININE 0.56 0.60  CALCIUM 7.4* 8.5*    COAG Lab Results  Component Value Date   INR 1.5 (H) 05/14/2020   INR 0.96 01/05/2017   No results found for: PTT

## 2020-05-18 NOTE — Progress Notes (Signed)
Pt refused CPAP for tonight.  

## 2020-05-18 NOTE — Evaluation (Signed)
Occupational Therapy Evaluation Patient Details Name: Angel Lane MRN: [id number] DOB: September 26, 1950 Today's Date: 05/18/2020    History of Present Illness 70 yo admitted 3/7 with type B aortic dissection s/p bil carotid subclavian transposition and TEVAR for dissection on 3/11. PMhx: HTN, CVA, anemia   Clinical Impression   This 70 yo female admitted and underwent above presents to acute OT with PLOF of being totally independent with basic ADLs. Currently pt is setup/S- min A with basic ADLs with pt dropping O2 sats with activity and notable SOB. She will continue to benefit from acute OT with follow up [place].    Follow Up Recommendations  Home health OT;Supervision/Assistance - 24 hour    Equipment Recommendations  None recommended by OT       Precautions / Restrictions Precautions Precautions: Fall Precaution Comments: bil Jp drains--removed 3/15 Restrictions Weight Bearing Restrictions: No      Mobility Bed Mobility Overal bed mobility: Needs Assistance Bed Mobility: Supine to Sit;Sit to Supine     Supine to sit: Supervision Sit to supine: Supervision   General bed mobility comments: increased time and use of rail    Transfers Overall transfer level: Needs assistance Equipment used: Rolling walker (2 wheeled) Transfers: Sit to/from Stand Sit to Stand: Min guard              Balance Overall balance assessment: Mild deficits observed, not formally tested                                         ADL either performed or assessed with clinical judgement   ADL Overall ADL's : Needs assistance/impaired Eating/Feeding: Independent;Sitting Eating/Feeding Details (indicate cue type and reason): EOB Grooming: Oral care;Set up;Sitting Grooming Details (indicate cue type and reason): EOB Upper Body Bathing: Set up;Sitting Upper Body Bathing Details (indicate cue type and reason): EOB Lower Body Bathing: Sit to/from stand;Min guard   Upper Body  Dressing : Set up;Sitting Upper Body Dressing Details (indicate cue type and reason): EOB Lower Body Dressing: Sit to/from stand;Min guard   Toilet Transfer: Minimal assistance Toilet Transfer Details (indicate cue type and reason): side step up to [place] and Hygiene: Min guard;Sit to/from stand          Educated pt on purse lip breathing     Vision Patient Visual Report: No change from baseline              Pertinent Vitals/Pain Pain Assessment: No/denies pain     Hand Dominance Right   Extremity/Trunk Assessment Upper Extremity Assessment Upper Extremity Assessment: Generalized weakness           Communication Communication Communication: HOH   Cognition Arousal/Alertness: Awake/alert Behavior During Therapy: WFL for tasks assessed/performed Overall Cognitive Status: Within Functional Limits for tasks assessed                                                Home Living Family/patient expects to be discharged to:: Private residence Living Arrangements: Spouse/significant other Available Help at Discharge: Available 24 hours/day Type of Home: House Home Access: Stairs to enter [employer] of Steps: 2 Entrance Stairs-Rails: Right;Left Home Layout: One level     Bathroom Shower/Tub: Walk-in shower;Door   Bathroom Toilet: Handicapped height  Home Equipment: Bedside commode;Shower seat;Walker - 2 wheels;Hand held shower head          Prior Functioning/Environment Level of Independence: Independent                 OT Problem List: Decreased strength;Impaired balance (sitting and/or standing);Cardiopulmonary status limiting activity      OT Treatment/Interventions: Self-care/ADL training;DME and/or AE instruction;Patient/family education;Balance training;Energy conservation    OT Goals(Current goals can be found in the care plan section) Acute Rehab OT Goals Patient Stated Goal: to  go home OT Goal Formulation: With patient Time For Goal Achievement: 06/01/20 Potential to Achieve Goals: Good  OT Frequency: Min 2X/week              AM-PAC OT "6 Clicks" Daily Activity     Outcome Measure Help from another person eating meals?: None Help from another person taking care of personal grooming?: A Little Help from another person toileting, which includes using toliet, bedpan, or urinal?: A Little Help from another person bathing (including washing, rinsing, drying)?: A Little Help from another person to put on and taking off regular upper body clothing?: A Little Help from another person to put on and taking off regular lower body clothing?: A Little 6 Click Score: 19   End of Session Nurse Communication:  (drop in O2 sats (RN reports she has been doing this and MD in room made aware as well))  Activity Tolerance:  (Pt's sats did drop from 97 to as low as 88 with activity) Patient left: in bed;with call bell/phone within reach;with bed alarm set  OT Visit Diagnosis: Unsteadiness on feet (R26.81);Muscle weakness (generalized) (M62.81)                Time: 3154-0086 OT Time Calculation (min): 29 min Charges:  OT General Charges $OT Visit: 1 Visit OT Evaluation $OT Eval Moderate Complexity: 1 Mod OT Treatments $Self Care/Home Management : 8-22 mins  [place], OTR/L Acute [employer] Pager [phone] Office [phone]     Almon Register 05/18/2020, 10:40 AM

## 2020-05-19 ENCOUNTER — Inpatient Hospital Stay (HOSPITAL_COMMUNITY): Payer: Medicare Other

## 2020-05-19 DIAGNOSIS — I7101 Dissection of thoracic aorta: Secondary | ICD-10-CM | POA: Diagnosis not present

## 2020-05-19 DIAGNOSIS — I2510 Atherosclerotic heart disease of native coronary artery without angina pectoris: Secondary | ICD-10-CM | POA: Diagnosis not present

## 2020-05-19 DIAGNOSIS — I2584 Coronary atherosclerosis due to calcified coronary lesion: Secondary | ICD-10-CM | POA: Diagnosis not present

## 2020-05-19 DIAGNOSIS — I161 Hypertensive emergency: Secondary | ICD-10-CM | POA: Diagnosis not present

## 2020-05-19 LAB — BASIC METABOLIC PANEL
Anion gap: 7 (ref 5–15)
BUN: 8 mg/dL (ref 8–23)
CO2: 26 mmol/L (ref 22–32)
Calcium: 8.6 mg/dL — ABNORMAL LOW (ref 8.9–10.3)
Chloride: 106 mmol/L (ref 98–111)
Creatinine, Ser: 0.52 mg/dL (ref 0.44–1.00)
GFR, Estimated: >60 mL/min (ref >60)
Glucose, Bld: 120 mg/dL — ABNORMAL HIGH (ref 70–99)
Potassium: 4.1 mmol/L (ref 3.5–5.1)
Sodium: 139 mmol/L (ref 135–145)

## 2020-05-19 LAB — CBC
HCT: 31.9 % — ABNORMAL LOW (ref 36.0–46.0)
Hemoglobin: 10.4 g/dL — ABNORMAL LOW (ref 12.0–15.0)
MCH: 29.5 pg (ref 26.0–34.0)
MCHC: 32.6 g/dL (ref 30.0–36.0)
MCV: 90.4 fL (ref 80.0–100.0)
Platelets: 207 10*3/uL (ref 150–400)
RBC: 3.53 MIL/uL — ABNORMAL LOW (ref 3.87–5.11)
RDW: 14.2 % (ref 11.5–15.5)
WBC: 9.7 10*3/uL (ref 4.0–10.5)
nRBC: 0 % (ref 0.0–0.2)

## 2020-05-19 LAB — TROPONIN I (HIGH SENSITIVITY): Troponin I (High Sensitivity): 11 ng/L (ref <18)

## 2020-05-19 MED ORDER — ROSUVASTATIN CALCIUM 20 MG PO TABS
20.0000 mg | ORAL_TABLET | Freq: Every day | ORAL | Status: DC
  Administered 2020-05-19 – 2020-05-21 (×3): 20 mg via ORAL
  Filled 2020-05-19 (×3): qty 1

## 2020-05-19 MED ORDER — MAGNESIUM CITRATE PO SOLN
1.0000 | Freq: Once | ORAL | Status: AC
  Administered 2020-05-19: 1 via ORAL
  Filled 2020-05-19: qty 296

## 2020-05-19 MED ORDER — FLEET ENEMA 7-19 GM/118ML RE ENEM
1.0000 | ENEMA | Freq: Once | RECTAL | Status: AC
  Administered 2020-05-19: 1 via RECTAL
  Filled 2020-05-19: qty 1

## 2020-05-19 NOTE — Progress Notes (Signed)
Pt has been complaining of abdominal pain and discomfort unrelieved by morphine.  Paged Dr. Oneida Alar and received an orders for mag citrate and a fleets enema.  No result from either intervention, pt now complaining of worsening pain radiating into chest and neck states it feels like a lot of pressure and fluid on her chest.   Orders put in for troponin, EKG, and CXR.  Dr. Donnetta Hutching paged and will come to bedside to assess pt.

## 2020-05-19 NOTE — Progress Notes (Addendum)
Progress Note  [name] Date of Encounter: 05/19/2020  [facility] Cardiologist: Skeet Latch, MD   Subjective   No chest pain or SOB.  POD #5 from bilateral carotid subclavian transposition and TEVAR for dissection. BP remains stable at 114/35mmHg  Inpatient Medications    Scheduled Meds: . carvedilol  6.25 mg Oral BID WC  . Chlorhexidine Gluconate Cloth  6 each Topical Daily  . docusate sodium  100 mg Oral BID  . heparin injection (subcutaneous)  5,000 Units Subcutaneous Q8H  . mouth rinse  15 mL Mouth Rinse BID  . nicotine  21 mg Transdermal Daily  . pantoprazole  40 mg Oral Daily  . rosuvastatin  5 mg Oral Daily  . sodium chloride flush  10-40 mL Intracatheter Q12H   Continuous Infusions: . sodium chloride 50 mL/hr at 05/19/20 0403  . dextrose 5 % and 0.9% NaCl Stopped (05/15/20 1426)  . magnesium sulfate bolus IVPB     PRN Meds: acetaminophen **OR** acetaminophen, alum & mag hydroxide-simeth, guaiFENesin-dextromethorphan, hydrALAZINE, labetalol, magnesium sulfate bolus IVPB, metoprolol tartrate, morphine injection, ondansetron, oxyCODONE, oxyCODONE-acetaminophen, phenol, potassium chloride, senna-docusate, sodium chloride flush, white petrolatum   Vital Signs    Vitals:   05/18/20 1430 05/18/20 1951 05/18/20 2346 05/19/20 0404  BP: (!) 99/46 129/66 127/60 114/63  Pulse: 72 72 67 66  Resp: 15 20 20 20   Temp: 98.1 F (36.7 C) 98.6 F (37 C) 98.1 F (36.7 C) 98.2 F (36.8 C)  TempSrc: Oral Oral Oral Oral  SpO2: 96% 98% 93% 97%  Weight:      Height:        Intake/Output Summary (Last 24 hours) at 05/19/2020 0810 Last data filed at 05/19/2020 0600 Gross per 24 hour  Intake 247.02 ml  Output 225 ml  Net 22.02 ml   Last 3 Weights 05/18/2020 05/17/2020 05/14/2020  Weight (lbs) 199 lb 4.8 oz 196 lb 3.4 oz 192 lb 0.3 oz  Weight (kg) 90.402 kg 89 kg 87.1 kg      Telemetry    NSR - Personally Reviewed  ECG    No new EKG to  review - Personally Reviewed  Physical Exam   GEN: Well nourished, well developed in no acute distress HEENT: Normal NECK: No JVD; No carotid bruits LYMPHATICS: No lymphadenopathy CARDIAC:RRR, no murmurs, rubs, gallops RESPIRATORY:  Clear to auscultation without rales, wheezing or rhonchi  ABDOMEN: Soft, non-tender, non-distended MUSCULOSKELETAL:  No edema; No deformity  SKIN: Warm and dry NEUROLOGIC:  Alert and oriented x 3 PSYCHIATRIC:  Normal affect    Labs    High Sensitivity Troponin:   Recent Labs  Lab 05/01/20 1610 05/01/20 1801 05/10/20 1001 05/10/20 1230  TROPONINIHS 5 9 4 5       Chemistry Recent Labs  Lab 05/16/20 0456 05/17/20 0116 05/18/20 1117 05/19/20 0154  NA 135 138 138 139  K 3.6 3.9 3.4* 4.1  CL 107 104  --  106  CO2 25 24  --  26  GLUCOSE 123* 112*  --  120*  BUN 8 9  --  8  CREATININE 0.56 0.60  --  0.52  CALCIUM 7.4* 8.5*  --  8.6*  GFRNONAA >60 >60  --  >60  ANIONGAP 3* 10  --  7     Hematology Recent Labs  Lab 05/16/20 0456 05/16/20 1743 05/17/20 0116 05/18/20 1117 05/19/20 0154  WBC 9.3  --  10.0  --  9.7  RBC 2.45*  --  3.75*  --  3.53*  HGB 7.6*   < > 11.0* 10.9* 10.4*  HCT 22.4*   < > 33.5* 32.0* 31.9*  MCV 91.4  --  89.3  --  90.4  MCH 31.0  --  29.3  --  29.5  MCHC 33.9  --  32.8  --  32.6  RDW 13.2  --  15.0  --  14.2  PLT 180  --  201  --  207   < > = values in this interval not displayed.    BNPNo results for input(s): BNP, PROBNP in the last 168 hours.   DDimer No results for input(s): DDIMER in the last 168 hours.   Radiology    DG Chest 2 View  Result Date: 05/18/2020 CLINICAL DATA:  Dyspnea. EXAM: CHEST - 2 VIEW COMPARISON:  05/14/2020 FINDINGS: Stable cardiomediastinal contours status post thoracic aortic stent graft placement. Small bilateral pleural effusions. Hazy bibasilar opacities. No pneumothorax. IMPRESSION: Small bilateral pleural effusions with hazy bibasilar opacities, atelectasis versus  infiltrate. Electronically Signed   By: Davina Poke D.O.   On: 05/18/2020 15:03    Cardiac Studies   Echo 05/02/20: 1. Left ventricular ejection fraction, by estimation, is 60 to 65%. The  left ventricle has normal function. The left ventricle has no regional  wall motion abnormalities. Left ventricular diastolic parameters are  consistent with Grade I diastolic  dysfunction (impaired relaxation). Elevated left atrial pressure.  2. Right ventricular systolic function is normal. The right ventricular  size is normal.  3. The mitral valve is normal in structure. No evidence of mitral valve  regurgitation. No evidence of mitral stenosis.  4. The aortic valve is tricuspid. Aortic valve regurgitation is moderate.  5. Descending aorta not well visualized.  6. The inferior vena cava is normal in size with greater than 50%  respiratory variability, suggesting right atrial pressure of 3 mmHg.   Patient Profile     70 y.o. female coronary calcification, hypertension, prior stroke, tobacco abuse, and anemia with recent hospitalization for type B aortic dissection and hypertensive emergency  Assessment & Plan    # Hypertensive emergency: # Type B aortic dissection:  -Now s/p bilateral carotid subclavian transposition and TEVAR for dissection with CV surgery.  -BP remains very well controlled at 114/6mmHg -Currently coreg 6.25mg  BID.  -Prior to her last hospitalization she was only on metoprolol and hydrochlorothiazide but in the hospital she has had significant hypokalemia.  -This raises the question of hyperaldosteronism. Renin/aldo appear to never have been sent so I will send it today and will need to be followed up on outpt -CT scan she has no adrenal adenomas.   -would not add any diuretics at this time as Bp is well controlled on carvedilol.  Secondary Causes of Hypertension  Medications/Herbal: OCP, steroids, stimulants, antidepressants, weight loss medication,  immune suppressants, NSAIDs, sympathomimetics, alcohol, caffeine, licorice, ginseng, St. John's wort, chemo (none identified)  Sleep Apnea: Resume CPAP Renal artery stenosis: None on CT-A 05/2020.  Renin/aldo pending Hyperaldosteronism: No adenomas on CT 05/2020 Hyper/hypothyroidism: TSH normal 02/2020 Pheochromocytoma: (testing not indicated)  Cushing's syndrome: (testing not indicated)  Coarctation of the aorta: none on CT 05/2020  # Coronary calcification:  # Hyperlipidemia: -Coronary Ca Noted on CT.  -Her chest pain is likely related to her Type B aortic dissection and resolved post-procedure.  -LDL was 105 02/2020.  -Increased rosuvastatin to 20mg  daily -repeat FLP and ALT in 6 weeks  # Tobacco abuse: Continue to encourage cessation.   I have  spent a total of 30 minutes with patient reviewing 2D echo , telemetry, EKGs, labs and examining patient as well as establishing an assessment and plan that was discussed with the patient.  > 50% of time was spent in direct patient care.    [facility] will sign off.   Medication Recommendations:  Carvedilol 6.25mg  BID and Crestor 20mg  daily Other recommendations (labs, testing, etc):  FLP and ALT in 6 weeks.  Followup on results of PRA/Aldo/renin Follow up as an outpatient:  TOC followup with Dr. Oval Linsey in 7-10 days     For questions or updates, please contact [place] Please consult [email or web address] for contact info under        Signed, Fransico Him, MD  05/19/2020, 8:10 AM

## 2020-05-19 NOTE — Progress Notes (Signed)
Patient ID: Angel Lane, female   DOB: 1950/12/07, 70 y.o.   MRN: [id number]  Progress Note    05/19/2020 5:15 PM 5 Days Post-Op  Subjective: Called about patient having continued epigastric and now with some burning in her chest.  Her husband is here as well.  He is quite concerned about the potential for her to be discharged tomorrow and does not feel that she is ready.   Vitals:   05/19/20 1307 05/19/20 1508  BP: (!) 107/47 (!) 136/53  Pulse: 77 83  Resp: 18 19  Temp: 98.6 F (37 C) 99.1 F (37.3 C)  SpO2: 100%    Physical Exam: Patient does appear anxious.  She is in no distress.  Her saturations are normal she is not hypotensive and heart rate is in the 80s.  Abdomen is soft and nontender.  Neck incisions are without hematoma.  She does have 2+ radial pulses bilaterally.  CBC    Component Value Date/Time   WBC 9.7 05/19/2020 0154   RBC 3.53 (L) 05/19/2020 0154   HGB 10.4 (L) 05/19/2020 0154   HGB 13.4 02/19/2020 1604   HCT 31.9 (L) 05/19/2020 0154   HCT 40.8 02/19/2020 1604   PLT 207 05/19/2020 0154   PLT 200 02/19/2020 1604   MCV 90.4 05/19/2020 0154   MCV 91 02/19/2020 1604   MCV 90 10/29/2011 1553   MCH 29.5 05/19/2020 0154   MCHC 32.6 05/19/2020 0154   RDW 14.2 05/19/2020 0154   RDW 12.3 02/19/2020 1604   RDW 13.2 10/29/2011 1553   LYMPHSABS 2.2 05/10/2020 1001   MONOABS 0.9 05/10/2020 1001   EOSABS 0.1 05/10/2020 1001   BASOSABS 0.0 05/10/2020 1001    BMET    Component Value Date/Time   NA 139 05/19/2020 0154   NA 141 02/19/2020 1604   NA 141 10/29/2011 1553   K 4.1 05/19/2020 0154   K 3.3 (L) 10/29/2011 1553   CL 106 05/19/2020 0154   CL 108 (H) 10/29/2011 1553   CO2 26 05/19/2020 0154   CO2 26 10/29/2011 1553   GLUCOSE 120 (H) 05/19/2020 0154   GLUCOSE 104 (H) 10/29/2011 1553   BUN 8 05/19/2020 0154   BUN 17 02/19/2020 1604   BUN 18 10/29/2011 1553   CREATININE 0.52 05/19/2020 0154   CREATININE 0.87 10/29/2011 1553   CALCIUM 8.6  (L) 05/19/2020 0154   CALCIUM 9.1 10/29/2011 1553   GFRNONAA >60 05/19/2020 0154   GFRNONAA >60 10/29/2011 1553   GFRAA 78 02/19/2020 1604   GFRAA >60 10/29/2011 1553    INR    Component Value Date/Time   INR 1.5 (H) 05/14/2020 1930     Intake/Output Summary (Last 24 hours) at 05/19/2020 1715 Last data filed at 05/19/2020 0600 Gross per 24 hour  Intake --  Output 100 ml  Net -100 ml   Chest x-ray shows some collapse in her left base.  No other acute findings  Assessment/Plan:  70 y.o. female normal EKG no significant change in her chest x-ray and normal vital signs.  Patient and husband reassured.  Continue to observe     Rosetta Posner, MD [facility] [phone] 05/19/2020 5:15 PM

## 2020-05-19 NOTE — Progress Notes (Signed)
PT Cancellation Note  Patient Details Name: Angel Lane MRN: [id number] DOB: 05/22/1950   Cancelled Treatment:    Reason Eval/Treat Not Completed: (P) Other (comment);Patient declined, no reason specified (Attempt 10:35 AM, pt decline due to headache. Attempt again 1728 pt reports fatigue after imaging and again defers PT. Given apple juice as she reports feeling dehydrated.) Will continue efforts next date per PT POC as schedule permits.   Kara Pacer [employer] 05/19/2020, 5:39 PM

## 2020-05-19 NOTE — Progress Notes (Signed)
OT Cancellation Note  Patient Details Name: Angel Lane MRN: [id number] DOB: December 12, 1950   Cancelled Treatment:    Reason Eval/Treat Not Completed: Patient at procedure or test/ unavailable;Medical issues which prohibited therapy;Other (comment) Pt with EKG being conducted upon arrival with RN also reporting pt with new c/o chest pain. Will hold off on OT session for today and check back as pt medically appropriate.   Corinne Ports K., COTA/L Acute Rehabilitation Services [phone]   Precious Haws 05/19/2020, 3:20 PM

## 2020-05-19 NOTE — Care Management Important Message (Signed)
Important Message  Patient Details  Name: ASUZENA WEIS MRN: [id number] Date of Birth: 11/01/1950   Medicare Important Message Given:  Yes     [name] 05/19/2020, 10:44 AM

## 2020-05-19 NOTE — Discharge Instructions (Signed)
°[facility]  ° °Discharge Instructions ° °Endovascular Aortic Aneurysm Repair ° °Please refer to the following instructions for your post-procedure care. Your surgeon or Physician Assistant will discuss any changes with you. ° °Activity ° °You are encouraged to walk as much as you can. You can slowly return to normal activities but must avoid strenuous activity and heavy lifting until your doctor tells you it's OK. Avoid activities such as vacuuming or swinging a gold club. It is normal to feel tired for several weeks after your surgery. Do not drive until your doctor gives the OK and you are no longer taking prescription pain medications. It is also normal to have difficulty with sleep habits, eating, and bowel movements after surgery. These will go away with time. ° °Bathing/Showering ° °Shower daily after you go home.  Do not soak in a bathtub, hot tub, or swim until the incision heals completely. ° °If you have incisions in your groin, wash the groin wounds with soap and water daily and pat dry. (No tub bath-only shower)  Then put a dry gauze or washcloth there to keep this area dry to help prevent wound infection daily and as needed.  Do not use Vaseline or neosporin on your incisions.  Only use soap and water on your incisions and then protect and keep dry. ° °Incision Care ° °Shower every day. Clean your incision with mild soap and water. Pat the area dry with a clean towel. You do not need a bandage unless otherwise instructed. Do not apply any ointments or creams to your incision. If you clothing is irritating, you may cover your incision with a dry gauze pad. ° °Wash the groin wound with soap and water daily and pat dry. (No tub bath-only shower)  Then put a dry gauze or washcloth there to keep this area dry daily and as needed.  Do not use Vaseline or neosporin on your incisions.  Only use soap and water on your incisions and then protect and keep dry. ° ° °Diet ° °Resume  your normal diet. There are no special food restrictions following this procedure. A low fat/low cholesterol diet is recommended for all patients with vascular disease. In order to heal from your surgery, it is CRITICAL to get adequate nutrition. Your body requires vitamins, minerals, and protein. Vegetables are the best source of vitamins and minerals. Vegetables also provide the perfect balance of protein. Processed food has little nutritional value, so try to avoid this. ° °Medications ° °Resume taking all of your medications unless your doctor or nurse practitioner tells you not to. If your incision is causing pain, you may take over-the-counter pain relievers such as acetaminophen (Tylenol). If you were prescribed a stronger pain medication, please be aware these medications can cause nausea and constipation. Prevent nausea by taking the medication with a snack or meal. Avoid constipation by drinking plenty of fluids and eating foods with a high amount of fiber, such as fruits, vegetables, and grains.  °Do not take Tylenol if you are taking prescription pain medications. ° ° °Follow up ° °Our office will schedule a follow-up appointment with a CT scan 3-4 weeks after your surgery. ° °Please call us immediately for any of the following conditions ° °• Severe or worsening pain in your legs or feet or in your abdomen back or chest. °• Increased pain, redness, drainage (pus) from your incision site. °• Increased abdominal pain, bloating, nausea, vomiting or persistent diarrhea. °• Fever of 101   degrees or higher. °• Swelling in your leg (s), °•  °Reduce your risk of vascular disease ° °•Stop smoking. If you would like help call [facility] at 1-800-QUIT-NOW (1-800-784-8669) or [place] at 336-586-4000. °•Manage your cholesterol °•Maintain a desired weight °•Control your diabetes °•Keep your blood pressure down ° °If you have questions, please call the office at [phone]. ° °

## 2020-05-19 NOTE — Progress Notes (Signed)
[facility]  Subjective  - right groin sore   Objective 114/63 66 98.2 F (36.8 C) (Oral) 20 97%  Intake/Output Summary (Last 24 hours) at 05/19/2020 0805 Last data filed at 05/19/2020 0600 Gross per 24 hour  Intake 247.02 ml  Output 225 ml  Net 22.02 ml   Incisions healing Palpable radial pulses  Assessment/Planning: BP well controlled final home meds per cardiology Plan for d/c home tomorrow CT scan in 3-4 weeks  [name] 05/19/2020 8:05 AM --  Laboratory Lab Results: Recent Labs    05/17/20 0116 05/18/20 1117 05/19/20 0154  WBC 10.0  --  9.7  HGB 11.0* 10.9* 10.4*  HCT 33.5* 32.0* 31.9*  PLT 201  --  207   BMET Recent Labs    05/17/20 0116 05/18/20 1117 05/19/20 0154  NA 138 138 139  K 3.9 3.4* 4.1  CL 104  --  106  CO2 24  --  26  GLUCOSE 112*  --  120*  BUN 9  --  8  CREATININE 0.60  --  0.52  CALCIUM 8.5*  --  8.6*    COAG Lab Results  Component Value Date   INR 1.5 (H) 05/14/2020   INR 0.96 01/05/2017   No results found for: PTT

## 2020-05-20 ENCOUNTER — Telehealth: Payer: Self-pay | Admitting: Physician Assistant

## 2020-05-20 ENCOUNTER — Other Ambulatory Visit: Payer: Self-pay | Admitting: Physician Assistant

## 2020-05-20 DIAGNOSIS — I2510 Atherosclerotic heart disease of native coronary artery without angina pectoris: Secondary | ICD-10-CM

## 2020-05-20 LAB — BASIC METABOLIC PANEL
Anion gap: 8 (ref 5–15)
BUN: 7 mg/dL — ABNORMAL LOW (ref 8–23)
CO2: 26 mmol/L (ref 22–32)
Calcium: 8.3 mg/dL — ABNORMAL LOW (ref 8.9–10.3)
Chloride: 105 mmol/L (ref 98–111)
Creatinine, Ser: 0.61 mg/dL (ref 0.44–1.00)
GFR, Estimated: >60 mL/min (ref >60)
Glucose, Bld: 103 mg/dL — ABNORMAL HIGH (ref 70–99)
Potassium: 3.2 mmol/L — ABNORMAL LOW (ref 3.5–5.1)
Sodium: 139 mmol/L (ref 135–145)

## 2020-05-20 LAB — BLOOD GAS, ARTERIAL
Acid-Base Excess: 3.7 mmol/L — ABNORMAL HIGH (ref 0.0–2.0)
Bicarbonate: 27.3 mmol/L (ref 20.0–28.0)
Drawn by: 36527
FIO2: 21
O2 Saturation: 90.5 %
Patient temperature: 37
pCO2 arterial: 38.2 mmHg (ref 32.0–48.0)
pH, Arterial: 7.468 — ABNORMAL HIGH (ref 7.350–7.450)
pO2, Arterial: 55.9 mmHg — ABNORMAL LOW (ref 83.0–108.0)

## 2020-05-20 LAB — CBC
HCT: 30.5 % — ABNORMAL LOW (ref 36.0–46.0)
Hemoglobin: 9.8 g/dL — ABNORMAL LOW (ref 12.0–15.0)
MCH: 29.4 pg (ref 26.0–34.0)
MCHC: 32.1 g/dL (ref 30.0–36.0)
MCV: 91.6 fL (ref 80.0–100.0)
Platelets: 213 10*3/uL (ref 150–400)
RBC: 3.33 MIL/uL — ABNORMAL LOW (ref 3.87–5.11)
RDW: 13.9 % (ref 11.5–15.5)
WBC: 8.3 10*3/uL (ref 4.0–10.5)
nRBC: 0 % (ref 0.0–0.2)

## 2020-05-20 MED ORDER — POTASSIUM CHLORIDE 20 MEQ PO PACK
40.0000 meq | PACK | ORAL | Status: AC
  Administered 2020-05-20 (×3): 40 meq via ORAL
  Filled 2020-05-20 (×3): qty 2

## 2020-05-20 MED ORDER — FUROSEMIDE 40 MG PO TABS
40.0000 mg | ORAL_TABLET | Freq: Two times a day (BID) | ORAL | Status: AC
  Administered 2020-05-20 (×2): 40 mg via ORAL
  Filled 2020-05-20 (×2): qty 1

## 2020-05-20 MED ORDER — POTASSIUM CHLORIDE 20 MEQ PO PACK
20.0000 meq | PACK | Freq: Every day | ORAL | Status: DC | PRN
  Filled 2020-05-20: qty 1

## 2020-05-20 MED ORDER — POTASSIUM CHLORIDE ER 10 MEQ PO TBCR
60.0000 meq | EXTENDED_RELEASE_TABLET | Freq: Two times a day (BID) | ORAL | Status: DC
  Filled 2020-05-20: qty 6

## 2020-05-20 NOTE — Progress Notes (Addendum)
[facility] [place]  Subjective  - States she feels sick from the pain in the neck incisions. She states she is having trouble swallowing.    Objective 118/61 77 98.8 F (37.1 C) (Oral) 20 96%  Intake/Output Summary (Last 24 hours) at 05/20/2020 0733 Last data filed at 05/19/2020 2054 Gross per 24 hour  Intake --  Output 450 ml  Net -450 ml    Appears ill Moving all ext. Palpable radial and pedal pulses B Subclavian incisions soft without hematoma, right groin incision soft without hematoma. Lungs non labored breathing,  IS at beside only able to inhale minimally. Heart RRR   Assessment/Planning: TEVAR, B subclavian bypass, right groin cut down Urine OP 450, Cr normal at 0.61. Fluid +8000 in vol since admission BP low 945'W systolic, HR 77 Troponin 8.3 No CP this am Drop in HGB from 10.4 to 9.8 no source of blood loss Acute blood loss anemia s/p 4 units total transfused  K+ 3.2 will replace  [name] 05/20/2020 7:33 AM --  Laboratory Lab Results: Recent Labs    05/19/20 0154 05/20/20 0125  WBC 9.7 8.3  HGB 10.4* 9.8*  HCT 31.9* 30.5*  PLT 207 213   BMET Recent Labs    05/19/20 0154 05/20/20 0125  NA 139 139  K 4.1 3.2*  CL 106 105  CO2 26 26  GLUCOSE 120* 103*  BUN 8 7*  CREATININE 0.52 0.61  CALCIUM 8.6* 8.3*    COAG Lab Results  Component Value Date   INR 1.5 (H) 05/14/2020   INR 0.96 01/05/2017   No results found for: PTT

## 2020-05-20 NOTE — Progress Notes (Signed)
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 PMhx: HTN, CVA, anemia    PT Comments    Pt received in chair, agreeable to therapy session and with good participation and tolerance for gait/stair training this date. Pt performed stair trial with good tolerance and progressed gait to household distances using RW and Supervision. VSS with SpO2 WNL on 2L O2 [place], she did have 2/4 DOE during stair trial and needed seated break prior to gait trial. Pt continues to benefit from PT services to progress toward functional mobility goals. Will plan to review HEP and progress ambulatory distances next date. Pt continues to benefit from PT services to progress toward functional mobility goals. Continue to recommend HHPT.    Follow Up Recommendations  Home health PT     Equipment Recommendations  None recommended by PT (per eval pt has needed equipment)    Recommendations for Other Services       Precautions / Restrictions Precautions Precautions: Fall Restrictions Weight Bearing Restrictions: No    Mobility  Bed Mobility Overal bed mobility: Needs Assistance Bed Mobility: Sit to Supine       Sit to supine: Modified independent (Device/Increase time);HOB elevated   General bed mobility comments: HOB elevated, no physical assist to perform    Transfers Overall transfer level: Needs assistance Equipment used: Rolling walker (2 wheeled) Transfers: Sit to/from Stand Sit to Stand: Supervision         General transfer comment: from chair and EOB to RW  Ambulation/Gait Ambulation/Gait assistance: Supervision;+2 safety/equipment (chair follow for safety) Gait Distance (Feet): 150 Feet Assistive device: Rolling walker (2 wheeled) Gait Pattern/deviations:  Step-through pattern;Decreased stride length Gait velocity: decreased Gait velocity interpretation: <1.31 ft/sec, indicative of household ambulator General Gait Details: cues for activity pacing, pursed-lip breathing; SpO2 92-95% on 2L O2 [place] with exertion; good use of RW, HR WNL, no orthostatic sxs   Stairs Stairs: Yes Stairs assistance: Min guard;+2 safety/equipment Stair Management: One rail Right;Step to pattern (HHA on LUE R RAIL to simulate B HR at home) Number of Stairs: 2 General stair comments: cues for sequencing, no LOB, 2/4 DOE; seated break after performing   Wheelchair Mobility    Modified Rankin (Stroke Patients Only)       Balance Overall balance assessment: Needs assistance Sitting-balance support: No upper extremity supported;Feet supported Sitting balance-Leahy Scale: Good     Standing balance support: Bilateral upper extremity supported Standing balance-Leahy Scale: Poor Standing balance comment: needs at least U UE support for static/dynamic standing tasks                            Cognition Arousal/Alertness: Awake/alert Behavior During Therapy: WFL for tasks assessed/performed Overall Cognitive Status: Within Functional Limits for tasks assessed                                 General Comments: alert and cooperative, anxious regarding mobility but able to perform w/encouragement      Exercises      General Comments General comments (skin integrity, edema, etc.): no c/o dizziness      Pertinent Vitals/Pain Pain Assessment: 0-10 Pain Score: 4  Pain Location: LBP Pain Descriptors / Indicators:  Grimacing;Discomfort Pain Intervention(s): Monitored during session;Repositioned    Home Living                      Prior Function            PT Goals (current goals can now be found in the care plan section) Acute Rehab PT Goals Patient Stated Goal: to go home PT Goal Formulation: With patient Time For Goal  Achievement: 05/31/20 Potential to Achieve Goals: Good Progress towards PT goals: Progressing toward goals    Frequency    Min 3X/week      PT Plan Current plan remains appropriate    Co-evaluation              AM-PAC PT "6 Clicks" Mobility   Outcome Measure  Help needed turning from your back to your side while in a flat bed without using bedrails?: None Help needed moving from lying on your back to sitting on the side of a flat bed without using bedrails?: None Help needed moving to and from a bed to a chair (including a wheelchair)?: A Little Help needed standing up from a chair using your arms (e.g., wheelchair or bedside chair)?: A Little Help needed to walk in hospital room?: A Little Help needed climbing 3-5 steps with a railing? : A Little 6 Click Score: 20    End of Session Equipment Utilized During Treatment: Gait belt;Oxygen Activity Tolerance: Patient tolerated treatment well Patient left: in bed;with call bell/phone within reach;with bed alarm set Nurse Communication: Mobility status PT Visit Diagnosis: Other abnormalities of gait and mobility (R26.89)     Time: 1458-1530 PT Time Calculation (min) (ACUTE ONLY): 32 min  Charges:  $Gait Training: 8-22 mins $Therapeutic Activity: 8-22 mins                     Quantarius Genrich P., PTA Acute Rehabilitation Services Pager: [phone] Office: [place] 05/20/2020, 4:31 PM

## 2020-05-20 NOTE — Progress Notes (Addendum)
Inpatient Rehabilitation Admissions Coordinator   Inpatient rehab consult received. I wait updated PT assessment today and then can assess patient for a potential admit to CIR pending our consultation.  Danne Baxter, RN, MSN Rehab Admissions Coordinator [phone] 05/20/2020 2:55 PM   I met with patient with PT as she navigated stairs in stairwell and ambulated in hallway. Patient not in need of an intense inpt rehab /CIR admit at this level. I have alerted TOC, Brenda, we will sign off at this time.  Danne Baxter, RN, MSN Rehab Admissions Coordinator [phone] 05/20/2020 3:23 PM

## 2020-05-20 NOTE — Progress Notes (Signed)
ABG collected with patient on room air. Sent to lab.

## 2020-05-20 NOTE — Progress Notes (Addendum)
TOC f/u arranged with APP. No TOC call initiated 2/2 going to CIR. Also placed info regarding fasting labs 4/28 on AVS (FLP, ALT in 6 weeks) but this will need to be reiterated to patient at her follow-up appointment since she's going to CIR in the interim.

## 2020-05-20 NOTE — Progress Notes (Addendum)
[facility] [place]  Subjective  -variety of aches and pains today.  She complains of numbness down the right lateral aspect of her thigh.  She has pain around both of her neck incisions.  She apparently had some epigastric pain yesterday which seems to have improved with a bowel movement.  She complains of total body weakness.  She states she was unable to walk yesterday.  She does not really complain of shortness of breath or chest pain.  She complains of some mild swallowing difficulty.   Objective 118/61 77 98.8 F (37.1 C) (Oral) 20 96%  Intake/Output Summary (Last 24 hours) at 05/20/2020 9381 Last data filed at 05/19/2020 2054 Gross per 24 hour  Intake --  Output 450 ml  Net -450 ml   Left and right neck incisions healing no significant drainage slightly edematous but no focal hematoma  Right groin incision no hematoma no drainage some periincisional ecchymosis  Extremities: 2+ dorsalis pedis pulses 2+ radial pulses  Neuro: Symmetric upper extremity lower extremity motor strength 5/5 no facial asymmetry  Assessment/Planning: Status post bilateral subclavian transpositions and repair of aortic dissection.  Patient with some left lower lobe collapse or consolidation yesterday.  However, she has no leukocytosis and is afebrile.  Will encourage incentive spirometer for now.  If becomes more febrile would consider treatment for pneumonia.  Acute blood loss anemia.  Hemoglobin has overall been fairly stable although slightly decreased from March 16.  Continue to trend.  She is not tachycardic or hypoxic.  She is in positive fluid balance.  However she has a normal BUN and creatinine which suggest that she does not have any significant renal dysfunction.  She is not short of breath to suggest fluid overload.  She does have some cephalization of the vessels on her chest x-ray.  We will give her some Lasix today.  Continue to work with PT and OT.  Encouraged  patient to eat and moved to try to regain some strength.  She has essentially been in bed for several weeks now.  We will get rehab consult.  I will call the patient's husband later today to discuss many of these issues with him as well.  Hypokalemia, replete  Hypertension aortic dissection blood pressure currently controlled on regimen.  Saline lock IV  Needs room air ABG to assess for need of home O2 as she has underlying COPD  [name] 05/20/2020 8:26 AM --  9:17 AM May 20, 2020.  Addendum:  Patient's husband updated by phone.  I discussed with him the issues of getting her motivated for physical activity and oral intake.  I also agreed with him that if she cannot walk she can go home.  He is also leaning towards the possibility of her going to rehab.  I discussed with him today that we will cancel the discharge today and have physical therapy and rehab evaluate her today.  We will also try to diurese her some today.  However, I am not sure that diuresis is going to fix all of the above problems but certainly if there is any contribution to her lung issues this may help with that.  [name], MD [facility] Office: [phone]   Laboratory Lab Results: Recent Labs    05/19/20 0154 05/20/20 0125  WBC 9.7 8.3  HGB 10.4* 9.8*  HCT 31.9* 30.5*  PLT 207 213   BMET Recent Labs    05/19/20 0154 05/20/20 0125  NA  139 139  K 4.1 3.2*  CL 106 105  CO2 26 26  GLUCOSE 120* 103*  BUN 8 7*  CREATININE 0.52 0.61  CALCIUM 8.6* 8.3*    COAG Lab Results  Component Value Date   INR 1.5 (H) 05/14/2020   INR 0.96 01/05/2017   No results found for: PTT

## 2020-05-20 NOTE — Progress Notes (Signed)
Returned call to pt's sister, Clarene Critchley, given update on pt status.  She is very concerned that pt is insistent about being discharged to home when the medical team feels that she would be appropriate for rehab. OT working with her as we spoke and PT in to see her after.  She asked that the medical team please be very straightforward on her needs for rehab.  I told her that this would be communicated, but ultimately, if pt refuses, we cannot force her to go.

## 2020-05-20 NOTE — Telephone Encounter (Signed)
Disregard phone note. Patient going to CIR per chart therefore will not send [facility] phone request.

## 2020-05-20 NOTE — Progress Notes (Signed)
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 PMhx: HTN, CVA, anemia   OT comments  This 70 yo female is making progress with overall mobility and ADLs. She is limited by need for supplemental O2 (which she did not use before) and use of RW (she did not use before); as well as not near her baseline for activity endurance. Her sats on 1 liter at rest (95-96%); RA at rest (92-93%); Ambulation on RA dropped to 88% with mask on and pt c/o not being able to breath. Removed mask with sats hovering around 90% on RA so increased O2 to 1 liter then 2 liters to get her back up to 95%. She will continue to benefit from acute OT with follow up on CIR now recommended to assist pt to get back to her baseline before going home. We will continue to follow.  Follow Up Recommendations  CIR    Equipment Recommendations  None recommended by OT       Precautions / Restrictions Precautions Precautions: Fall Restrictions Weight Bearing Restrictions: No       Mobility Bed Mobility               General bed mobility comments: OOB in recliner    Transfers Overall transfer level: Needs assistance Equipment used: Rolling walker (2 wheeled) Transfers: Sit to/from Stand Sit to Stand: Min guard              Balance Overall balance assessment: Needs assistance Sitting-balance support: No upper extremity supported;Feet supported Sitting balance-Leahy Scale: Good     Standing balance support: Bilateral upper extremity supported Standing balance-Leahy Scale: Poor                             ADL either performed or assessed with clinical judgement   ADL Overall ADL's : Needs assistance/impaired                         Toilet Transfer: Min  guard;Ambulation;RW Toilet Transfer Details (indicate cue type and reason): Simulated recliner>out door and down hallway>back to recliner (total 100 feet)           General ADL Comments: Pt had just completed ADL session with NT. Pt reported she washed her face and her back/front peri areas and NT did all the rest. Encouraged pt to let NT know she can do more of her bath on her own.     Vision Patient Visual Report: No change from baseline            Cognition Arousal/Alertness: Awake/alert Behavior During Therapy: WFL for tasks assessed/performed Overall Cognitive Status: Within Functional Limits for tasks assessed                                                     Pertinent Vitals/ Pain       Pain Assessment: No/denies pain         Frequency  Min 2X/week        Progress Toward Goals  OT Goals(current goals can now be found in the  care plan section)  Progress towards OT goals: Progressing toward goals  Acute Rehab OT Goals Patient Stated Goal: to go home OT Goal Formulation: With patient Time For Goal Achievement: 06/01/20 Potential to Achieve Goals: Good  Plan Discharge plan remains appropriate       AM-PAC OT "6 Clicks" Daily Activity     Outcome Measure   Help from another person eating meals?: None Help from another person taking care of personal grooming?: A Little Help from another person toileting, which includes using toliet, bedpan, or urinal?: A Little Help from another person bathing (including washing, rinsing, drying)?: A Little Help from another person to put on and taking off regular upper body clothing?: A Little Help from another person to put on and taking off regular lower body clothing?: A Little 6 Click Score: 19    End of Session Equipment Utilized During Treatment: Oxygen (RA-2 liters (increased need with acivity))  OT Visit Diagnosis: Unsteadiness on feet (R26.81);Muscle weakness (generalized) (M62.81)    Activity Tolerance Patient tolerated treatment well   Patient Left in chair;with call bell/phone within reach   Nurse Communication  (RN reports pt does not need chair alarm)        Time: 8177-1165 OT Time Calculation (min): 26 min  Charges: OT General Charges $OT Visit: 1 Visit OT Treatments $Self Care/Home Management : 23-37 mins  [place], OTR/L Acute [employer] Pager [phone] Office [phone]    Almon Register 05/20/2020, 11:35 AM

## 2020-05-21 LAB — BASIC METABOLIC PANEL
Anion gap: 9 (ref 5–15)
BUN: 5 mg/dL — ABNORMAL LOW (ref 8–23)
CO2: 29 mmol/L (ref 22–32)
Calcium: 8.5 mg/dL — ABNORMAL LOW (ref 8.9–10.3)
Chloride: 100 mmol/L (ref 98–111)
Creatinine, Ser: 0.67 mg/dL (ref 0.44–1.00)
GFR, Estimated: >60 mL/min (ref >60)
Glucose, Bld: 115 mg/dL — ABNORMAL HIGH (ref 70–99)
Potassium: 3.4 mmol/L — ABNORMAL LOW (ref 3.5–5.1)
Sodium: 138 mmol/L (ref 135–145)

## 2020-05-21 LAB — CBC
HCT: 29.3 % — ABNORMAL LOW (ref 36.0–46.0)
Hemoglobin: 9.7 g/dL — ABNORMAL LOW (ref 12.0–15.0)
MCH: 29.8 pg (ref 26.0–34.0)
MCHC: 33.1 g/dL (ref 30.0–36.0)
MCV: 90.2 fL (ref 80.0–100.0)
Platelets: 230 10*3/uL (ref 150–400)
RBC: 3.25 MIL/uL — ABNORMAL LOW (ref 3.87–5.11)
RDW: 14 % (ref 11.5–15.5)
WBC: 7.5 10*3/uL (ref 4.0–10.5)
nRBC: 0 % (ref 0.0–0.2)

## 2020-05-21 MED ORDER — CARVEDILOL 6.25 MG PO TABS: 6.2500 mg | ORAL_TABLET | Freq: Two times a day (BID) | ORAL | 1 refills | Status: DC

## 2020-05-21 MED ORDER — OXYCODONE-ACETAMINOPHEN 5-325 MG PO TABS: 1.0000 | ORAL_TABLET | Freq: Four times a day (QID) | ORAL | 0 refills | Status: DC | PRN

## 2020-05-21 NOTE — Progress Notes (Signed)
Pts potassium is 3.4 this am. Pt encouraged to drink PRN order of potassium chloride. Pt refused stating that she thinks all of the medicine is making her "not feel good". Pt also complains of dizziness when sitting up to use the bathroom this am. Will continue to monitor. Adella Hare, RN

## 2020-05-21 NOTE — Progress Notes (Signed)
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 PMhx: HTN, CVA, anemia    PT Comments    Pt has progress well toward and met most of the goals.  Emphasis on monitoring need for supplemental O2, see separate progress note, progressing gait stability and general transfer safety.    Follow Up Recommendations  Home health PT     Equipment Recommendations  None recommended by PT    Recommendations for Other Services       Precautions / Restrictions Precautions Precautions: Fall    Mobility  Bed Mobility Overal bed mobility: Needs Assistance Bed Mobility: Supine to Sit     Supine to sit: Modified independent (Device/Increase time)     General bed mobility comments: slow, but from flat bed and no use of the rails    Transfers Overall transfer level: Needs assistance Equipment used: Rolling walker (2 wheeled) Transfers: Sit to/from Stand Sit to Stand: Supervision;Modified independent (Device/Increase time)         General transfer comment: pt used UE's appropriately for safety.  No assist needed  Ambulation/Gait Ambulation/Gait assistance: Supervision;Min guard Gait Distance (Feet): 120 Feet (then standing rest against the wall before returning to room for another 120 feet.) Assistive device: Rolling walker (2 wheeled) Gait Pattern/deviations: Step-through pattern;Decreased stride length   Gait velocity interpretation: <1.8 ft/sec, indicate of risk for recurrent falls General Gait Details: general steady with RW, but carries it at times.  Also steady without AD, but fatigues noticeably.  Sats on RA slowly dropping into 80's to lows of 86-88%.  On 2L [place], pt able to attain and consistently maintain low 90's   Stairs              Wheelchair Mobility    Modified Rankin (Stroke Patients Only)       Balance Overall balance assessment: Needs assistance   Sitting balance-Leahy Scale: Good     Standing balance support: Bilateral upper extremity supported;No upper extremity supported Standing balance-Leahy Scale: Fair Standing balance comment: able to maintain stance with AD, but prefers to have something stable to hold to.                            Cognition Arousal/Alertness: Awake/alert Behavior During Therapy: WFL for tasks assessed/performed Overall Cognitive Status: Within Functional Limits for tasks assessed                                        Exercises      General Comments        Pertinent Vitals/Pain Pain Assessment: Faces Faces Pain Scale: Hurts a little bit Pain Location: LBP Pain Descriptors / Indicators: Discomfort;Grimacing;Guarding Pain Intervention(s): Monitored during session    Home Living                      Prior Function            PT Goals (current goals can now be found in the care plan section) Acute Rehab PT Goals Patient Stated Goal: to go home PT Goal Formulation: With patient Time For Goal Achievement: 05/31/20 Potential to Achieve Goals: Good Progress towards PT goals:  Progressing toward goals    Frequency    Min 3X/week      PT Plan Current plan remains appropriate    Co-evaluation              AM-PAC PT "6 Clicks" Mobility   Outcome Measure  Help needed turning from your back to your side while in a flat bed without using bedrails?: None Help needed moving from lying on your back to sitting on the side of a flat bed without using bedrails?: None Help needed moving to and from a bed to a chair (including a wheelchair)?: A Little Help needed standing up from a chair using your arms (e.g., wheelchair or bedside chair)?: A Little Help needed to walk in hospital room?: A Little Help needed climbing  3-5 steps with a railing? : A Little 6 Click Score: 20    End of Session Equipment Utilized During Treatment: Oxygen Activity Tolerance: Patient tolerated treatment well Patient left: in chair;with call bell/phone within reach Nurse Communication: Mobility status PT Visit Diagnosis: Other abnormalities of gait and mobility (R26.89);Difficulty in walking, not elsewhere classified (R26.2)     Time: 1019-1050 PT Time Calculation (min) (ACUTE ONLY): 31 min  Charges:  $Gait Training: 8-22 mins $Therapeutic Activity: 8-22 mins                     05/21/2020  Ginger Carne., PT Acute Rehabilitation Services [phone]  (pager) [phone]  (office)   Angel Lane 05/21/2020, 12:16 PM

## 2020-05-21 NOTE — Progress Notes (Incomplete)
SATURATION QUALIFICATIONS: (This note is used to comply with regulatory documentation for home oxygen)  Patient Saturations on Room Air at Rest = 95%  Patient Saturations on Room Air while Ambulating = ***%  Patient Saturations on *** Liters of oxygen while Ambulating = ***%  Please briefly explain why patient needs home oxygen:

## 2020-05-21 NOTE — Progress Notes (Addendum)
[facility] [place]  Subjective  - Still complaining of chest tightness, pressure.  She states drinking the K+ made her feel bad and she felt dizzy when she sat up.   Objective (!) 95/50 74 99 F (37.2 C) (Oral) 20 90%  Intake/Output Summary (Last 24 hours) at 05/21/2020 0657 Last data filed at 05/21/2020 4259 Gross per 24 hour  Intake --  Output 2526 ml  Net -2526 ml    Subclavian incision healing without hematoma Right groin incision healing well, dry 4 x 4 placed over groin incision. Lungs non labored breathing, IS at bedside able to inhale better this am than 05/20/20. Palpable pedal pulses Hypotension this am 95/50  Heart RRR, paired PVC at times, but not sustained.  Assessment/Planning: TEVAR, B subclavian bypass, right groin cut down   Urine OP improved with lasix  >2500 Hypokalemia will cont. To replenish HGB stable 9.7 hypotension cont. 95/50 this am  O2 SAT 90 Still requiring IV push pain medication Plans for [facility] PT/OT Rehab coordinator states patient not in need of CIR. Still just not feeling well, chest tightness, feels like a pocket of pressure.   [name] 05/21/2020 6:57 AM --  2+ DP and radial pulses, neuro intact Ambulated yesterday Some indigestion this morning No dyspnea or chest pain SBP currently 119 D/w pt she feels she is ok to go home Rehab eval pt no deconditioned enough to need rehab  Will d/c home today follow up in 3 weeks  Laboratory Lab Results: Recent Labs    05/20/20 0125 05/21/20 0125  WBC 8.3 7.5  HGB 9.8* 9.7*  HCT 30.5* 29.3*  PLT 213 230   BMET Recent Labs    05/20/20 0125 05/21/20 0125  NA 139 138  K 3.2* 3.4*  CL 105 100  CO2 26 29  GLUCOSE 103* 115*  BUN 7* 5*  CREATININE 0.61 0.67  CALCIUM 8.3* 8.5*    COAG Lab Results  Component Value Date   INR 1.5 (H) 05/14/2020   INR 0.96 01/05/2017   No results found for: PTT

## 2020-05-21 NOTE — TOC Initial Note (Signed)
Transition of Care [facility]) - Initial/Assessment Note    Patient Details  Name: Angel Lane MRN: [id number] Date of Birth: [birth year]  [facility] CM/SW Contact:    Bethena Roys, RN Phone Number: 05/21/2020, 12:19 PM  Clinical Narrative:   Patient presented for carotid subclaviam transposition and TEVAR. Prior to arrival patient was from home with her spouse. Patient has durable medical equipment rolling walker and cpap in the home. Patient in need of oxygen- ordered via [employer]. Delivered to the room. Medicare.gov list provided to the patient and she chose [facility] for services. Referral made to [facility] and start of care to begin within 24-48 hours post transition home.               Expected Discharge Plan: [place] Barriers to Discharge: No Barriers Identified   Patient Goals and CMS Choice Patient states their goals for this hospitalization and ongoing recovery are:: to return home CMS Medicare.gov Compare Post Acute Care list provided to:: Patient Choice offered to / list presented to : Patient  Expected Discharge Plan and Services Expected Discharge Plan: [place] In-house Referral: NA Discharge Planning Services: CM Consult Post Acute Care Choice: [place] Living arrangements for the past 2 months: Single Family Home Expected Discharge Date: 05/21/20               DME Arranged: Oxygen DME Agency: [employer] Date DME Agency Contacted: 05/21/20 Time DME Agency Contacted: 11 Representative spoke with at DME Agency: Freda Munro [place]: PT,OT [place] Agency: [place] Date [place]: 05/21/20 Time [place]: 70 Representative spoke with at [place]: Tommi Rumps  Prior Living Arrangements/Services Living arrangements for the past 2 months: [place] with:: Sierra Brooks Patient language and need for interpreter reviewed:: Yes Do you  feel safe going back to the place where you live?: Yes      Need for Family Participation in Patient Care: Yes (Comment) Care giver support system in place?: Yes (comment) Current home services: DME (pt has a cpap, rolling walker,) Criminal Activity/Legal Involvement Pertinent to Current Situation/Hospitalization: No - Comment as needed  Activities of Daily Living Home Assistive Devices/Equipment: None ADL Screening (condition at time of admission) Patient's cognitive ability adequate to safely complete daily activities?: Yes Is the patient deaf or have difficulty hearing?: No Does the patient have difficulty seeing, even when wearing glasses/contacts?: No (reading glasswes) Does the patient have difficulty concentrating, remembering, or making decisions?: No Patient able to express need for assistance with ADLs?: Yes Does the patient have difficulty dressing or bathing?: No Independently performs ADLs?: Yes (appropriate for developmental age) Does the patient have difficulty walking or climbing stairs?: No Weakness of Legs: Left Weakness of Arms/Hands: Left  Permission Sought/Granted Permission sought to share information with : Family Estate agent Permission granted to share information with : Yes, Verbal Permission Granted     Permission granted to share info w AGENCY: [employer] [facility]        Emotional Assessment Appearance:: Appears stated age Attitude/Demeanor/Rapport: Engaged Affect (typically observed): Appropriate Orientation: : Oriented to Situation,Oriented to  Time,Oriented to Self,Oriented to Place Alcohol / Substance Use: Not Applicable Psych Involvement: No (comment)  Admission diagnosis:  Aortic dissection distal to left subclavian ([place]) [I71.01] Dissection of thoracoabdominal aorta ([place]) [I71.03] Patient Active Problem List   Diagnosis Date Noted  . Hypertensive emergency   . Coronary artery calcification   .  Aortic  dissection distal to left subclavian ([place]) 05/10/2020  . HCAP (healthcare-associated pneumonia) 05/08/2020  . Acute respiratory failure with hypoxia ([place]) 05/08/2020  . Dissecting aneurysm of thoracic aorta, Stanford type B ([place]) 05/01/2020  . History of CVA (cerebrovascular accident) 02/19/2020  . OSA on CPAP 02/19/2020  . DOE (dyspnea on exertion) 02/19/2020  . Encounter for annual physical exam 02/19/2020  . Breast cancer screening by mammogram 02/19/2020  . Palpitations 02/19/2020  . Left hip pain 02/19/2020  . Avitaminosis D 07/02/2019  . Trochanteric bursitis of both hips 07/02/2019  . Impacted cerumen of right ear 07/02/2019  . Pure hypercholesterolemia 10/08/2017  . Hyperglycemia 10/08/2017  . Insomnia 03/23/2017  . Tobacco abuse disorder 01/15/2017  . Left hemiparesis ([place]) 01/06/2017  . HTN (hypertension) 01/06/2017   PCP:  Virginia Crews, MD Pharmacy:   [id number] [place], [address] [place] [address] Phone: [phone] Fax: [phone]   Readmission Risk Interventions No flowsheet data found.

## 2020-05-21 NOTE — Progress Notes (Addendum)
SATURATION QUALIFICATIONS: (This note is used to comply with regulatory documentation for home oxygen)  Patient Saturations on Room Air at Rest = 97% initially coming into the room.  Patient Saturations on Room Air while Ambulating = 88%  Patient Saturations on 2 Liters of oxygen while Ambulating = 91-93%  Please briefly explain why patient needs home oxygen: pt consistently dropped to at or below 88% as distance increased on RA.  At rest, pt could bring the SpO2 back up toward 90%, but not sustain during activity.  On 2L, pt could consistently maintain low, but adequate saturations while ambulating.  05/21/2020  Ginger Carne., PT Acute Rehabilitation Services [phone]  (pager) [phone]  (office)

## 2020-05-24 NOTE — Discharge Summary (Addendum)
[facility] Discharge Summary   Patient ID:  Angel Lane MRN: [id number] DOB/AGE: 71/28/1952 71 y.o.  Admit date: 05/10/2020 Discharge date: 05/21/20  Date of Surgery: 05/14/2020 Surgeon: Surgeon(s): Fields, Jessy Oto, MD Cherre Robins, MD Serafina Mitchell, MD  Admission Diagnosis: Aortic dissection distal to left subclavian [facility]) [I71.01] Dissection of thoracoabdominal aorta [facility]) [I71.03]  Discharge Diagnoses:  Aortic dissection distal to left subclavian (H[facility]) [I71.01] Dissection of thoracoabdominal aorta ([place]) [I71.03]  Secondary Diagnoses: Past Medical History:  Diagnosis Date  . Anemia   . Depression   . Dyspnea    due to stroke  . Hypertension   . Neuromuscular disorder ([place])    left sided weakness from stroke  . Sleep apnea   . Stroke [facility])    November 2018     Procedure(s): BILATERAL CAROTID-SUBCLAVIAN  TRANSPOSITION THORACIC AORTIC ENDOVASCULAR STENT USING GORE TAG 52mm X 15cm GRAFT AND GORE TAG 46mm X 20cm GRAFT ULTRASOUND GUIDANCE FOR VASCULAR ACCESS  Discharged Condition: stable  HPI: This is a 70 y.o. 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 Admitted for BP control and then discharged.  She returned with increased pain.  Plan for repair Friday. Bilateral carotid subclavian bypass and TEVAR   Hospital Course:  Angel Lane is a 70 y.o. female is S/P Procedure(s): BILATERAL CAROTID-SUBCLAVIAN  TRANSPOSITION THORACIC AORTIC ENDOVASCULAR STENT USING GORE TAG 98mm X 15cm GRAFT AND GORE TAG 50mm X 20cm GRAFT ULTRASOUND GUIDANCE FOR VASCULAR ACCESS  Triphasic bilateral radial and pedal signals bilateral, Pain issues post op.  Hypokalemia was corrected and BP controlled.  Post op she was hypotensive and tachy.  She received PRBC an lasix.  Bilateral subclavian drainage were d/c'd post op day 3.  Once she was more mobile and pain was  controlled she was discharge home.  Moving all 4 ext. With palpable pulses.  HH ordered.      Significant Diagnostic Studies: CBC Lab Results  Component Value Date   WBC 7.5 05/21/2020   HGB 9.7 (L) 05/21/2020   HCT 29.3 (L) 05/21/2020   MCV 90.2 05/21/2020   PLT 230 05/21/2020    BMET    Component Value Date/Time   NA 138 05/21/2020 0125   NA 141 02/19/2020 1604   NA 141 10/29/2011 1553   K 3.4 (L) 05/21/2020 0125   K 3.3 (L) 10/29/2011 1553   CL 100 05/21/2020 0125   CL 108 (H) 10/29/2011 1553   CO2 29 05/21/2020 0125   CO2 26 10/29/2011 1553   GLUCOSE 115 (H) 05/21/2020 0125   GLUCOSE 104 (H) 10/29/2011 1553   BUN 5 (L) 05/21/2020 0125   BUN 17 02/19/2020 1604   BUN 18 10/29/2011 1553   CREATININE 0.67 05/21/2020 0125   CREATININE 0.87 10/29/2011 1553   CALCIUM 8.5 (L) 05/21/2020 0125   CALCIUM 9.1 10/29/2011 1553   GFRNONAA >60 05/21/2020 0125   GFRNONAA >60 10/29/2011 1553   GFRAA 78 02/19/2020 1604   GFRAA >60 10/29/2011 1553   COAG Lab Results  Component Value Date   INR 1.5 (H) 05/14/2020   INR 0.96 01/05/2017     Disposition:  Discharge to :Home Discharge Instructions    Call MD for:  redness, tenderness, or signs of infection (pain, swelling, bleeding, redness, odor or green/yellow discharge around incision site)   Complete by: As directed    Call MD for:  redness, tenderness, or signs of  infection (pain, swelling, bleeding, redness, odor or green/yellow discharge around incision site)   Complete by: As directed    Call MD for:  severe or increased pain, loss or decreased feeling  in affected limb(s)   Complete by: As directed    Call MD for:  severe or increased pain, loss or decreased feeling  in affected limb(s)   Complete by: As directed    Call MD for:  temperature >100.5   Complete by: As directed    Call MD for:  temperature >100.5   Complete by: As directed    Discharge patient   Complete by: As directed    Discharge disposition:  06-Home-Health Care Svc   Discharge patient date: 05/21/2020   Face-to-face encounter (required for Medicare/Medicaid patients)   Complete by: As directed    I Barbie Banner certify that this patient is under my care and that I, or a nurse practitioner or physician's assistant working with me, had a face-to-face encounter that meets the physician face-to-face encounter requirements with this patient on 05/21/2020. The encounter with the patient was in whole, or in part for the following medical condition(s) which is the primary reason for home health care (List medical condition): PAD   The encounter with the patient was in whole, or in part, for the following medical condition, which is the primary reason for home health care: PAD   I certify that, based on my findings, the following services are medically necessary home health services: Physical therapy   Reason for Medically Necessary Home Health Services: Therapy- Instruction on Safe use of Assistive Devices for ADLs   My clinical findings support the need for the above services: Pain interferes with ambulation/mobility   Further, I certify that my clinical findings support that this patient is homebound due to: Pain interferes with ambulation/mobility   Home Health   Complete by: As directed    To provide the following care/treatments: PT   Resume previous diet   Complete by: As directed    Resume previous diet   Complete by: As directed      Allergies as of 05/21/2020      Reactions   Penicillins Other (See Comments)   chills      Medication List    STOP taking these medications   cefdinir 300 MG capsule Commonly known as: OMNICEF     TAKE these medications   acetaminophen 500 MG tablet Commonly known as: TYLENOL Take 1,000 mg by mouth every 6 (six) hours as needed for mild pain.   amLODipine 5 MG tablet Commonly known as: NORVASC Take 1 tablet (5 mg total) by mouth daily.   carvedilol 6.25 MG tablet Commonly known as:  COREG Take 1 tablet (6.25 mg total) by mouth 2 (two) times daily with a meal. What changed:   medication strength  how much to take   hydrochlorothiazide 12.5 MG tablet Commonly known as: HYDRODIURIL Take 2 tablets (25 mg total) by mouth daily.   oxyCODONE-acetaminophen 5-325 MG tablet Commonly known as: PERCOCET/ROXICET Take 1 tablet by mouth every 6 (six) hours as needed for moderate pain.   rosuvastatin 5 MG tablet Commonly known as: Crestor Take 1 tablet (5 mg total) by mouth daily.      Verbal and written Discharge instructions given to the patient. Wound care per Discharge AVS  Follow-up Information    Elam Dutch, MD Follow up on 06/10/2020.   Specialties: Vascular Surgery, Cardiology Why: appointment time is 2:20pm Contact information:  [address] [place] [address] [phone]        [facility] Follow up.   Specialty: Cardiology Why: [place] location - please come to this office on 07/01/20 in the morning for fasting bloodwork (lipid profile and ALT level). Nothing to eat or drink for 6 hours beforehand except sips of water with your morning medicines. Contact information: [address] [place] [phone]       Abigail Butts., PA-C Follow up.   Specialties: Physician Assistant, Cardiology Why: [facility] (Cardiology) [facility] location - a follow up has been scheduled Monday May 31, 2020 11:45 AM (Arrive by 11:30 AM). Daleen Snook is one of our cardiology PAs. Contact information: [address] [place] [address] [place] [address] [phone]        [facility] Follow up.   Specialty: Home Health Services Why: Physical Therapy, Occupational Therapy-office to call with visit times.  Contact information: [address] [place] [address] [phone]        [facility] Follow up.   Why: oxygen to be delivered to the room prior  to transition home.  Contact information: [place] [address] [phone]               Signed: [name] 05/24/2020, 9:12 AM - For VQI Registry use --- Instructions: Press F2 to tab through selections.  Delete question if not applicable.   Post-op:  Time to Extubation: [x ] In OR, [ ]  < 12 hrs, [ ]  12-24 hrs, [ ]  >=24 hrs Vasopressors Req. Post-op: No MI: [x ] No, [ ]  Troponin only, [ ]  EKG or Clinical New Arrhythmia: No CHF: Yes ICU Stay: 2 days Transfusion: Yes  If yes, 4 units given  Complications: Resp failure: [x ] none, [ ]  Pneumonia, [ ]  Ventilator Chg in renal function: [x ] none, [ ]  Inc. Cr > 0.5, [ ]  Temp. Dialysis, [ ]  Permanent dialysis Leg ischemia: [x ] No, [ ]  Yes, no Surgery needed, [ ]  Yes, Surgery needed, [ ]  Amputation Bowel ischemia: [x ] No, [ ]  Medical Rx, [ ]  Surgical Rx Wound complication: [x ] No, [ ]  Superficial separation/infection, [ ]  Return to OR Return to OR: No  Return to OR for bleeding: No Stroke: [x ] None, [ ]  Minor, [ ]  Major  Discharge medications: Statin use:  Yes ASA use:  No  for medical reason   Plavix use:  No  for medical reason   Beta blocker use:  Yes

## 2020-05-25 ENCOUNTER — Other Ambulatory Visit: Payer: Self-pay

## 2020-05-25 DIAGNOSIS — I7101 Dissection of thoracic aorta: Secondary | ICD-10-CM

## 2020-05-25 DIAGNOSIS — I71019 Dissection of thoracic aorta, unspecified: Secondary | ICD-10-CM

## 2020-05-27 ENCOUNTER — Telehealth: Payer: Self-pay

## 2020-05-27 ENCOUNTER — Ambulatory Visit: Payer: Federal, State, Local not specified - PPO | Admitting: General Practice

## 2020-05-27 NOTE — Telephone Encounter (Signed)
Dr. Oneida Alar spoke to patient's husband - triage call - per [facility] - patient may be placed on PA schedule tomorrow for wound check.

## 2020-05-27 NOTE — Telephone Encounter (Signed)
Patient's husband called and left message to speak to Dr. Oneida Alar - advised MD - he attempted call back -UTR.  Gave verbal orders to Mel from PT to have a [place] come out to assess wounds.

## 2020-05-28 ENCOUNTER — Inpatient Hospital Stay (HOSPITAL_COMMUNITY): Payer: Medicare Other

## 2020-05-28 ENCOUNTER — Ambulatory Visit: Payer: Federal, State, Local not specified - PPO

## 2020-05-28 ENCOUNTER — Other Ambulatory Visit: Payer: Self-pay

## 2020-05-28 ENCOUNTER — Ambulatory Visit (INDEPENDENT_AMBULATORY_CARE_PROVIDER_SITE_OTHER): Payer: Federal, State, Local not specified - PPO | Admitting: Physician Assistant

## 2020-05-28 ENCOUNTER — Inpatient Hospital Stay (HOSPITAL_COMMUNITY)
Admission: AD | Admit: 2020-05-28 | Discharge: 2020-06-04 | DRG: 641 | Disposition: A | Discharge status: Home / Self Care | Payer: Medicare Other | Source: Ambulatory Visit | Attending: Vascular Surgery | Admitting: Vascular Surgery

## 2020-05-28 VITALS — BP 92/65 | HR 76 | Temp 98.3°F | Resp 20 | Ht 65.0 in | Wt 193.0 lb

## 2020-05-28 DIAGNOSIS — E876 Hypokalemia: Secondary | ICD-10-CM | POA: Diagnosis present

## 2020-05-28 DIAGNOSIS — Z8679 Personal history of other diseases of the circulatory system: Secondary | ICD-10-CM

## 2020-05-28 DIAGNOSIS — I10 Essential (primary) hypertension: Secondary | ICD-10-CM | POA: Diagnosis present

## 2020-05-28 DIAGNOSIS — K648 Other hemorrhoids: Secondary | ICD-10-CM | POA: Diagnosis present

## 2020-05-28 DIAGNOSIS — K5641 Fecal impaction: Secondary | ICD-10-CM | POA: Diagnosis present

## 2020-05-28 DIAGNOSIS — F32A Depression, unspecified: Secondary | ICD-10-CM | POA: Diagnosis present

## 2020-05-28 DIAGNOSIS — Z8249 Family history of ischemic heart disease and other diseases of the circulatory system: Secondary | ICD-10-CM

## 2020-05-28 DIAGNOSIS — I71019 Dissection of thoracic aorta, unspecified: Secondary | ICD-10-CM | POA: Insufficient documentation

## 2020-05-28 DIAGNOSIS — R079 Chest pain, unspecified: Secondary | ICD-10-CM

## 2020-05-28 DIAGNOSIS — Z79899 Other long term (current) drug therapy: Secondary | ICD-10-CM

## 2020-05-28 DIAGNOSIS — R42 Dizziness and giddiness: Secondary | ICD-10-CM | POA: Diagnosis present

## 2020-05-28 DIAGNOSIS — Z9049 Acquired absence of other specified parts of digestive tract: Secondary | ICD-10-CM | POA: Diagnosis not present

## 2020-05-28 DIAGNOSIS — I7101 Dissection of thoracic aorta: Secondary | ICD-10-CM

## 2020-05-28 DIAGNOSIS — F17210 Nicotine dependence, cigarettes, uncomplicated: Secondary | ICD-10-CM | POA: Diagnosis present

## 2020-05-28 DIAGNOSIS — I69354 Hemiplegia and hemiparesis following cerebral infarction affecting left non-dominant side: Secondary | ICD-10-CM

## 2020-05-28 DIAGNOSIS — K625 Hemorrhage of anus and rectum: Secondary | ICD-10-CM | POA: Diagnosis present

## 2020-05-28 DIAGNOSIS — G4733 Obstructive sleep apnea (adult) (pediatric): Secondary | ICD-10-CM | POA: Diagnosis present

## 2020-05-28 DIAGNOSIS — R627 Adult failure to thrive: Principal | ICD-10-CM | POA: Diagnosis present

## 2020-05-28 DIAGNOSIS — K5730 Diverticulosis of large intestine without perforation or abscess without bleeding: Secondary | ICD-10-CM | POA: Diagnosis present

## 2020-05-28 DIAGNOSIS — Z880 Allergy status to penicillin: Secondary | ICD-10-CM | POA: Diagnosis not present

## 2020-05-28 DIAGNOSIS — Z9889 Other specified postprocedural states: Secondary | ICD-10-CM

## 2020-05-28 LAB — COMPREHENSIVE METABOLIC PANEL
ALT: 18 U/L (ref 0–44)
AST: 21 U/L (ref 15–41)
Albumin: 2.6 g/dL — ABNORMAL LOW (ref 3.5–5.0)
Alkaline Phosphatase: 50 U/L (ref 38–126)
Anion gap: 10 (ref 5–15)
BUN: 17 mg/dL (ref 8–23)
CO2: 34 mmol/L — ABNORMAL HIGH (ref 22–32)
Calcium: 9.4 mg/dL (ref 8.9–10.3)
Chloride: 94 mmol/L — ABNORMAL LOW (ref 98–111)
Creatinine, Ser: 0.73 mg/dL (ref 0.44–1.00)
GFR, Estimated: >60 mL/min (ref >60)
Glucose, Bld: 113 mg/dL — ABNORMAL HIGH (ref 70–99)
Potassium: 3 mmol/L — ABNORMAL LOW (ref 3.5–5.1)
Sodium: 138 mmol/L (ref 135–145)
Total Bilirubin: 0.9 mg/dL (ref 0.3–1.2)
Total Protein: 6.9 g/dL (ref 6.5–8.1)

## 2020-05-28 LAB — CBC
HCT: 34.7 % — ABNORMAL LOW (ref 36.0–46.0)
HCT: 34.3 % — ABNORMAL LOW (ref 36.0–46.0)
Hemoglobin: 11.4 g/dL — ABNORMAL LOW (ref 12.0–15.0)
Hemoglobin: 11 g/dL — ABNORMAL LOW (ref 12.0–15.0)
MCH: 29.8 pg (ref 26.0–34.0)
MCH: 29.3 pg (ref 26.0–34.0)
MCHC: 32.9 g/dL (ref 30.0–36.0)
MCHC: 32.1 g/dL (ref 30.0–36.0)
MCV: 90.8 fL (ref 80.0–100.0)
MCV: 91.2 fL (ref 80.0–100.0)
Platelets: 266 10*3/uL (ref 150–400)
Platelets: 277 10*3/uL (ref 150–400)
RBC: 3.82 MIL/uL — ABNORMAL LOW (ref 3.87–5.11)
RBC: 3.76 MIL/uL — ABNORMAL LOW (ref 3.87–5.11)
RDW: 13.3 % (ref 11.5–15.5)
RDW: 13.2 % (ref 11.5–15.5)
WBC: 7.6 10*3/uL (ref 4.0–10.5)
WBC: 7 10*3/uL (ref 4.0–10.5)
nRBC: 0 % (ref 0.0–0.2)
nRBC: 0 % (ref 0.0–0.2)

## 2020-05-28 MED ORDER — PHENOL 1.4 % MT LIQD: 1.0000 | OROMUCOSAL | Status: DC | PRN

## 2020-05-28 MED ORDER — HYDROMORPHONE HCL 1 MG/ML IJ SOLN: 0.5000 mg | INTRAMUSCULAR | Status: DC | PRN

## 2020-05-28 MED ORDER — GUAIFENESIN-DM 100-10 MG/5ML PO SYRP: 15.0000 mL | ORAL_SOLUTION | ORAL | Status: DC | PRN

## 2020-05-28 MED ORDER — POLYETHYLENE GLYCOL 3350 17 G PO PACK
17.0000 g | PACK | Freq: Every day | ORAL | Status: DC | PRN
  Administered 2020-05-31: 17 g via ORAL
  Filled 2020-05-28 (×2): qty 1

## 2020-05-28 MED ORDER — LABETALOL HCL 5 MG/ML IV SOLN
10.0000 mg | INTRAVENOUS | Status: DC | PRN
  Administered 2020-05-30: 10 mg via INTRAVENOUS
  Filled 2020-05-28: qty 4

## 2020-05-28 MED ORDER — HYDROMORPHONE HCL 2 MG/ML IJ SOLN: 0.5000 mg | INTRAMUSCULAR | Status: DC | PRN

## 2020-05-28 MED ORDER — ONDANSETRON HCL 4 MG/2ML IJ SOLN: 4.0000 mg | Freq: Four times a day (QID) | INTRAMUSCULAR | Status: DC | PRN

## 2020-05-28 MED ORDER — PANTOPRAZOLE SODIUM 40 MG PO TBEC
40.0000 mg | DELAYED_RELEASE_TABLET | Freq: Every day | ORAL | Status: DC
  Administered 2020-05-28 – 2020-06-04 (×8): 40 mg via ORAL
  Filled 2020-05-28 (×8): qty 1

## 2020-05-28 MED ORDER — SODIUM CHLORIDE 0.9 % IV SOLN: INTRAVENOUS | Status: DC

## 2020-05-28 MED ORDER — METOPROLOL TARTRATE 5 MG/5ML IV SOLN: 2.0000 mg | INTRAVENOUS | Status: DC | PRN

## 2020-05-28 MED ORDER — ALUM & MAG HYDROXIDE-SIMETH 200-200-20 MG/5ML PO SUSP: 15.0000 mL | ORAL | Status: DC | PRN

## 2020-05-28 MED ORDER — HEPARIN SODIUM (PORCINE) 5000 UNIT/ML IJ SOLN
5000.0000 [IU] | Freq: Three times a day (TID) | INTRAMUSCULAR | Status: DC
  Administered 2020-05-28 – 2020-06-04 (×19): 5000 [IU] via SUBCUTANEOUS
  Filled 2020-05-28 (×20): qty 1

## 2020-05-28 MED ORDER — HYDRALAZINE HCL 20 MG/ML IJ SOLN
5.0000 mg | INTRAMUSCULAR | Status: DC | PRN
Start: 2020-05-28 — End: 2020-06-04

## 2020-05-28 MED ORDER — OXYCODONE-ACETAMINOPHEN 5-325 MG PO TABS: 1.0000 | ORAL_TABLET | ORAL | Status: DC | PRN

## 2020-05-28 MED ORDER — POTASSIUM CHLORIDE CRYS ER 20 MEQ PO TBCR
20.0000 meq | EXTENDED_RELEASE_TABLET | Freq: Once | ORAL | Status: AC
  Administered 2020-05-28: 40 meq via ORAL
  Filled 2020-05-28: qty 2

## 2020-05-28 MED ORDER — LABETALOL HCL 5 MG/ML IV SOLN: 10.0000 mg | INTRAVENOUS | Status: DC | PRN

## 2020-05-28 MED ORDER — ONDANSETRON HCL 4 MG/2ML IJ SOLN
4.0000 mg | Freq: Four times a day (QID) | INTRAMUSCULAR | Status: DC | PRN
  Administered 2020-05-28: 4 mg via INTRAVENOUS
  Filled 2020-05-28: qty 2

## 2020-05-28 MED ORDER — CEFAZOLIN SODIUM-DEXTROSE 1-4 GM/50ML-% IV SOLN
1.0000 g | Freq: Three times a day (TID) | INTRAVENOUS | Status: DC
  Administered 2020-05-28 – 2020-06-03 (×18): 1 g via INTRAVENOUS
  Filled 2020-05-28 (×20): qty 50

## 2020-05-28 MED ORDER — BISACODYL 10 MG RE SUPP: 10.0000 mg | Freq: Every day | RECTAL | Status: DC | PRN

## 2020-05-28 MED ORDER — PANTOPRAZOLE SODIUM 20 MG PO TBEC: 40.0000 mg | DELAYED_RELEASE_TABLET | Freq: Every day | ORAL | Status: DC

## 2020-05-28 MED ORDER — OXYCODONE-ACETAMINOPHEN 5-325 MG PO TABS
1.0000 | ORAL_TABLET | Freq: Four times a day (QID) | ORAL | Status: DC | PRN
  Administered 2020-05-28: 1 via ORAL
  Administered 2020-05-28 – 2020-05-30 (×3): 2 via ORAL
  Administered 2020-05-30 – 2020-05-31 (×4): 1 via ORAL
  Administered 2020-06-01: 2 via ORAL
  Administered 2020-06-02: 1 via ORAL
  Administered 2020-06-02 – 2020-06-03 (×3): 2 via ORAL
  Filled 2020-05-28 (×2): qty 1
  Filled 2020-05-28 (×2): qty 2
  Filled 2020-05-28: qty 1
  Filled 2020-05-28: qty 2
  Filled 2020-05-28: qty 1
  Filled 2020-05-28 (×4): qty 2
  Filled 2020-05-28: qty 1
  Filled 2020-05-28: qty 2

## 2020-05-28 MED ORDER — HEPARIN SODIUM (PORCINE) 5000 UNIT/ML IJ SOLN: 5000.0000 [IU] | Freq: Three times a day (TID) | INTRAMUSCULAR | Status: DC

## 2020-05-28 MED ORDER — IOHEXOL 350 MG/ML SOLN
100.0000 mL | Freq: Once | INTRAVENOUS | Status: AC | PRN
  Administered 2020-05-28: 100 mL via INTRAVENOUS

## 2020-05-28 MED ORDER — POTASSIUM CHLORIDE CRYS ER 10 MEQ PO TBCR: 20.0000 meq | EXTENDED_RELEASE_TABLET | Freq: Once | ORAL | Status: DC

## 2020-05-28 MED ORDER — POLYETHYLENE GLYCOL 3350 17 G PO PACK: 17.0000 g | PACK | Freq: Every day | ORAL | Status: DC | PRN

## 2020-05-28 MED ORDER — HYDRALAZINE HCL 20 MG/ML IJ SOLN: 5.0000 mg | INTRAMUSCULAR | Status: DC | PRN

## 2020-05-28 MED ORDER — ACETAMINOPHEN 325 MG PO TABS
325.0000 mg | ORAL_TABLET | ORAL | Status: DC | PRN
  Administered 2020-05-28 – 2020-06-04 (×7): 325 mg via ORAL
  Filled 2020-05-28 (×8): qty 1

## 2020-05-28 NOTE — Progress Notes (Signed)
Pharmacy Antibiotic Note  Angel Lane is a 70 y.o. female admitted on 05/28/2020 with cellulitis.  Pharmacy has been consulted for vancomycin dosing. Discussed with PA given mild erythema and MRSA negative to consider cefazolin. PA agreed.   Plan: Start cefazolin 1g q8 hr Monitor cultures, clinical status, renal fx Narrow abx as able and f/u duration   Temp (24hrs), Avg:98.6 F (37 C), Min:98.3 F (36.8 C), Max:98.9 F (37.2 C)  No results for input(s): WBC, CREATININE, LATICACIDVEN, VANCOTROUGH, VANCOPEAK, VANCORANDOM, GENTTROUGH, GENTPEAK, GENTRANDOM, TOBRATROUGH, TOBRAPEAK, TOBRARND, AMIKACINPEAK, AMIKACINTROU, AMIKACIN in the last 168 hours.  Estimated Creatinine Clearance: 70.5 mL/min (by C-G formula based on SCr of 0.67 mg/dL).    Allergies  Allergen Reactions  . Penicillins Other (See Comments)    chills    Antimicrobials this admission: cefaz  3/25 >>    Microbiology results: None sent   Thank you for allowing pharmacy to be a part of this patient's care.  Benetta Spar, PharmD, BCPS, BCCP Clinical Pharmacist  Please check AMION for all [place] phone numbers After 10:00 PM, call [place] [phone]

## 2020-05-28 NOTE — Progress Notes (Signed)
Pt direct admit, pt oriented to unit, CHG given, IV team consult in. Will continue to monitor    Phoebe Sharps, RN

## 2020-05-28 NOTE — Progress Notes (Addendum)
  POST OPERATIVE OFFICE NOTE    CC:  F/u for surgery  HPI:  This is a 70 y.o. female who is s/p Bilateral carotid subclavian bypass and TEVAR on 05/14/2020 by Dr. Oneida Alar.    Pt returns today for follow up and here with her husband.  Pt states she overall doesn't feel good.  She states that she has tightness around her clavicular incisions and in her chest.  She occasionally feels sharp pains in her abdomen, mostly on the right lateral side.  She also has c/o tingling in her right thigh.  She states she isn't eating much that she doesn't have much of an appetite and states she doesn't feel like she is making much urine.  She states she hasn't had a BM in a couple of days.  She hasn't had any fevers.  He BP is low today. She also feels dizzy.  Pt states she hasn't showered bc she was told not to.  Allergies  Allergen Reactions  . Penicillins Other (See Comments)    chills    Current Outpatient Medications  Medication Sig Dispense Refill  . acetaminophen (TYLENOL) 500 MG tablet Take 1,000 mg by mouth every 6 (six) hours as needed for mild pain.    Marland Kitchen amLODipine (NORVASC) 5 MG tablet Take 1 tablet (5 mg total) by mouth daily. 30 tablet 0  . carvedilol (COREG) 6.25 MG tablet Take 1 tablet (6.25 mg total) by mouth 2 (two) times daily with a meal. 60 tablet 1  . hydrochlorothiazide (HYDRODIURIL) 12.5 MG tablet Take 2 tablets (25 mg total) by mouth daily. 60 tablet 0  . oxyCODONE-acetaminophen (PERCOCET/ROXICET) 5-325 MG tablet Take 1 tablet by mouth every 6 (six) hours as needed for moderate pain. 30 tablet 0  . rosuvastatin (CRESTOR) 5 MG tablet Take 1 tablet (5 mg total) by mouth daily. 30 tablet 5   No current facility-administered medications for this visit.     ROS:  See HPI  Physical Exam:  Today's Vitals   05/28/20 1207  BP: 92/65  Pulse: 76  Resp: 20  Temp: 98.3 F (36.8 C)  TempSrc: Temporal  SpO2: 100%  Weight: 193 lb (87.5 kg)  Height: 5\' 5"  (1.651 m)  PainSc: 4    Body  mass index is 32.12 kg/m.   Incision:  Right clavicular incision with mild erythema.  Left claviciular and right groin incisions look good.  Extremities:  Easily palpable bilateral DP/PT/radial/ulnar Abdomen:  soft   Assessment/Plan:  This is a 70 y.o. female who is s/p: Bilateral carotid subclavian bypass and TEVAR  -pt with failure to thrive not eating much and by report not making much urine.  Will admit to hospital for hydration, labs and CTA to evaluate stent graft.  She has bed on 4E.  Will also put on IV abx for some erythema and recent drainage from right clavicular incision. -feel the tingling in the right thigh is due to nerve irritation from right groin cut down.  Discussed this with pt and husband.  She has easily palpable pulses bilateral feet and hands.  -discussed with pt that it is ok to shower.  Discussed keeping right groin dry at all other times.   Leontine Locket, [facility] [phone]   Clinic MD:  Pt seen and examined with Dr. Donzetta Matters

## 2020-05-29 LAB — URINALYSIS, ROUTINE W REFLEX MICROSCOPIC
Bilirubin Urine: NEGATIVE
Glucose, UA: NEGATIVE mg/dL
Ketones, ur: NEGATIVE mg/dL
Nitrite: NEGATIVE
Protein, ur: NEGATIVE mg/dL
Specific Gravity, Urine: >1.046 — ABNORMAL HIGH (ref 1.005–1.030)
pH: 6 (ref 5.0–8.0)

## 2020-05-29 MED ORDER — NICOTINE 21 MG/24HR TD PT24
21.0000 mg | MEDICATED_PATCH | Freq: Every day | TRANSDERMAL | Status: DC
  Administered 2020-05-29 – 2020-06-03 (×6): 21 mg via TRANSDERMAL
  Filled 2020-05-29 (×6): qty 1

## 2020-05-29 MED ORDER — DIPHENHYDRAMINE HCL 25 MG PO CAPS
25.0000 mg | ORAL_CAPSULE | Freq: Four times a day (QID) | ORAL | Status: DC | PRN
  Administered 2020-05-29: 25 mg via ORAL
  Filled 2020-05-29: qty 1

## 2020-05-29 NOTE — Progress Notes (Signed)
[facility] [place] PROGRESS NOTE  ASSESSMENT / PLAN: Angel Lane is a 70 y.o. female with failure to thrive after bilateral carotid-subclavian transposition and TEVAR for type B aortic dissection.  Symptomatically improved today.  Continue IV hydration.  Continue diet as tolerated.  Continue to mobilize.  SUBJECTIVE: Feels much better.  Rested overnight.  Tolerating some food today.  OBJECTIVE: BP 123/66 (BP Location: Right Arm)   Pulse 76   Temp 98.4 F (36.9 C) (Oral)   Resp 20   SpO2 94%  No intake or output data in the 24 hours ending 05/29/20 1130   Neck incisions clean dry and intact  CBC Latest Ref Rng & Units 05/28/2020 05/28/2020 05/21/2020  WBC 4.0 - 10.5 K/uL 7.0 7.6 7.5  Hemoglobin 12.0 - 15.0 g/dL 11.0(L) 11.4(L) 9.7(L)  Hematocrit 36.0 - 46.0 % 34.3(L) 34.7(L) 29.3(L)  Platelets 150 - 400 K/uL 277 266 230     CMP Latest Ref Rng & Units 05/28/2020 05/21/2020 05/20/2020  Glucose 70 - 99 mg/dL 113(H) 115(H) 103(H)  BUN 8 - 23 mg/dL 17 5(L) 7(L)  Creatinine 0.44 - 1.00 mg/dL 0.73 0.67 0.61  Sodium 135 - 145 mmol/L 138 138 139  Potassium 3.5 - 5.1 mmol/L 3.0(L) 3.4(L) 3.2(L)  Chloride 98 - 111 mmol/L 94(L) 100 105  CO2 22 - 32 mmol/L 34(H) 29 26  Calcium 8.9 - 10.3 mg/dL 9.4 8.5(L) 8.3(L)  Total Protein 6.5 - 8.1 g/dL 6.9 - -  Total Bilirubin 0.3 - 1.2 mg/dL 0.9 - -  Alkaline Phos 38 - 126 U/L 50 - -  AST 15 - 41 U/L 21 - -  ALT 0 - 44 U/L 18 - -    Estimated Creatinine Clearance: 70.5 mL/min (by C-G formula based on SCr of 0.73 mg/dL).  Yevonne Aline. Stanford Breed, MD [facility] Phone Number: [phone] 05/29/2020 11:30 AM

## 2020-05-30 NOTE — Progress Notes (Addendum)
  Progress Note    05/30/2020 8:41 AM * No surgery found *  Subjective:  Says she is still feeling much better than when she came to hospital. Slept well. Ambulated yesterday without issues. Tolerating diet. Still having a lot of soreness and stiffness across incisions   Vitals:   05/30/20 0410 05/30/20 0825  BP: (!) 144/74 (!) 146/72  Pulse: 88 82  Resp: 18 20  Temp: 98.5 F (36.9 C) 98.1 F (36.7 C)  SpO2: 96% 97%   Physical Exam: Cardiac: regular Lungs: non labored Incisions:  Bilateral subclavian incisions clean, dry and intact. No swelling or hematoma Extremities: well perfused and warm. 2+ radial pulses bilaterally, 2+ femoral pulses bilaterally, 2+ DP pulses bilaterally. Right femoral incision is clean, dry and intact Abdomen:  Soft, non tender Neurologic: alert and oriented  CBC    Component Value Date/Time   WBC 7.0 05/28/2020 1456   RBC 3.76 (L) 05/28/2020 1456   HGB 11.0 (L) 05/28/2020 1456   HGB 13.4 02/19/2020 1604   HCT 34.3 (L) 05/28/2020 1456   HCT 40.8 02/19/2020 1604   PLT 277 05/28/2020 1456   PLT 200 02/19/2020 1604   MCV 91.2 05/28/2020 1456   MCV 91 02/19/2020 1604   MCV 90 10/29/2011 1553   MCH 29.3 05/28/2020 1456   MCHC 32.1 05/28/2020 1456   RDW 13.2 05/28/2020 1456   RDW 12.3 02/19/2020 1604   RDW 13.2 10/29/2011 1553   LYMPHSABS 2.2 05/10/2020 1001   MONOABS 0.9 05/10/2020 1001   EOSABS 0.1 05/10/2020 1001   BASOSABS 0.0 05/10/2020 1001    BMET    Component Value Date/Time   NA 138 05/28/2020 1456   NA 141 02/19/2020 1604   NA 141 10/29/2011 1553   K 3.0 (L) 05/28/2020 1456   K 3.3 (L) 10/29/2011 1553   CL 94 (L) 05/28/2020 1456   CL 108 (H) 10/29/2011 1553   CO2 34 (H) 05/28/2020 1456   CO2 26 10/29/2011 1553   GLUCOSE 113 (H) 05/28/2020 1456   GLUCOSE 104 (H) 10/29/2011 1553   BUN 17 05/28/2020 1456   BUN 17 02/19/2020 1604   BUN 18 10/29/2011 1553   CREATININE 0.73 05/28/2020 1456   CREATININE 0.87 10/29/2011 1553    CALCIUM 9.4 05/28/2020 1456   CALCIUM 9.1 10/29/2011 1553   GFRNONAA >60 05/28/2020 1456   GFRNONAA >60 10/29/2011 1553   GFRAA 78 02/19/2020 1604   GFRAA >60 10/29/2011 1553    INR    Component Value Date/Time   INR 1.5 (H) 05/14/2020 1930     Intake/Output Summary (Last 24 hours) at 05/30/2020 0841 Last data filed at 05/29/2020 1600 Gross per 24 hour  Intake 240 ml  Output --  Net 240 ml     Assessment/Plan:  70 y.o. female admitted with failure to thrive after bilateral carotid-subclavian transposition and TEVAR for type B aortic dissection.  Symptomatically improved.  Continue IV hydration. Continue IV ABX. Continue diet as tolerated.  Continue to mobilize   Karoline Caldwell, [place] [facility] [phone] 05/30/2020 8:41 AM   VASCULAR STAFF ADDENDUM: I have independently interviewed and examined the patient. I agree with the above.  Complaining about nasal congestion, episodic R sided chest discomfort, and R foot intermittent paresthesia. Neck incisions clean and dry 2+ Dps Continue IVF  Yevonne Aline. Stanford Breed, MD [facility] Phone Number: [phone] 05/30/2020 11:26 AM

## 2020-05-30 NOTE — Progress Notes (Deleted)
Cardiology Office Note   Date:  05/30/2020   ID:  Angel Lane 04-21-1950, MRN [id number]  PCP:  Virginia Crews, MD  Cardiologist:  Skeet Latch, MD EP: None  No chief complaint on file.     History of Present Illness: Angel Lane is a 70 y.o. female PMH of coronary artery calcifications on CT, hypertension, hyperlipidemia, prior stroke, tobacco abuse, and recent hospitalization for type B aortic dissection s/p bilateral carotid subclavian transposition and TEVAR who presents for post hospital follow-up.  She was recently admitted to [facility] from 05/10/2020-05/21/2020 for management of recently diagnosed type B aortic dissection.  Prior to this she was discharged from the hospital 05/08/2020 after being diagnosed with type B aortic dissection, at which time the initial plan was for conservative management with plans to repeat a CTA in 1 month.  Unfortunately she continued to have chest pain prompting her to return to the hospital 05/10/2020.  She ultimately underwent bilateral carotid subclavian transposition and TEVAR on 05/14/2020.  Her postop course was complicated by blood loss anemia requiring transfusion.  She was last seen by cardiology 05/19/2020 and recommended for ongoing aggressive BP control, as well as risk factor modifications for coronary artery calcifications.  Her last echocardiogram 05/02/2020 showed EF 60 to 65%, G1 DD, no RWMA, and moderate AI.  It does not appear that she has had ischemic testing, though as mentioned above prior CT scans have suggested coronary artery calcifications namely in the LAD and RCA.  She was seen outpatient by vascular surgery 05/28/2020 at which time she had numerous complaints concerning for failure to thrive.  There was concern for possible infection at her incision site.  She was directly admitted to [facility] from the office that day.  She received IV fluids and antibiotics with improvement in symptoms.  She was discharged  home***  1. Type B aortic dissection s/p bilateral carotid sublavian transposition and TEVAR: felt to be 2/2 poorly controlled HTN. There was concern for possible hyperaldosteronism - Renin/aldo labs are still pending results. Recent admission over the weekend with ***  2. HTN: BP*** today - Continue amlodipine, carvedilol, and HCTZ,   3. HLD: LDL 105 02/2020, subsequently started on statin - Continue crestor - Plan for repeat FLP/LFTs in 1 month - orders already placed.   4. Coronary artery calcifications: noted on CT scan, specifically LAD and RCA calcifications. No complaints of chest pain *** - Continue aggressive risk factor modifications for goal BP <130/80, LDL <70, and A1C <7 - Low threshold for ischemic testing if chest pain.   5. Tobacco abuse: smoking *** - Continue to encourage cessation    Past Medical History:  Diagnosis Date  . Anemia   . Depression   . Dyspnea    due to stroke  . Hypertension   . 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 Sleep apnea   . Stroke [facility])    November 2018     Past Surgical History:  Procedure Laterality Date  . ABDOMINAL SURGERY    . CARDIAC CATHETERIZATION    . CAROTID-SUBCLAVIAN BYPASS GRAFT Bilateral 05/14/2020   Procedure: BILATERAL CAROTID-SUBCLAVIAN  TRANSPOSITION;  Surgeon: Elam Dutch, MD;  Location: [facility] OR;  Service: Vascular;  Laterality: Bilateral;  . CATARACT EXTRACTION W/PHACO Left 05/16/2017   Procedure: CATARACT EXTRACTION PHACO AND INTRAOCULAR LENS PLACEMENT ([place]) LEFT;  Surgeon: Leandrew Koyanagi, MD;  Location: [place];  Service: Ophthalmology;  Laterality:  Left;  . CATARACT EXTRACTION W/PHACO Right 06/13/2017   Procedure: CATARACT EXTRACTION PHACO AND INTRAOCULAR LENS PLACEMENT ([place]) RIGHT;  Surgeon: Leandrew Koyanagi, MD;  Location: [place];  Service: Ophthalmology;  Laterality: Right;  sleep apnea  . CHOLECYSTECTOMY    . COLONOSCOPY N/A 01/21/2020    Procedure: COLONOSCOPY;  Surgeon: Toledo, Benay Pike, MD;  Location: [facility] ENDOSCOPY;  Service: Gastroenterology;  Laterality: N/A;  . THORACIC AORTIC ENDOVASCULAR STENT GRAFT N/A 05/14/2020   Procedure: THORACIC AORTIC ENDOVASCULAR STENT USING GORE TAG 24mm X 15cm GRAFT AND GORE TAG 66mm X 20cm GRAFT;  Surgeon: Elam Dutch, MD;  Location: [place];  Service: Vascular;  Laterality: N/A;  . ULTRASOUND GUIDANCE FOR VASCULAR ACCESS Bilateral 05/14/2020   Procedure: ULTRASOUND GUIDANCE FOR VASCULAR ACCESS;  Surgeon: Elam Dutch, MD;  Location: [place];  Service: Vascular;  Laterality: Bilateral;     No current facility-administered medications for this visit.   No current outpatient medications on file.   Facility-Administered Medications Ordered in Other Visits  Medication Dose Route Frequency Provider Last Rate Last Admin  . 0.9 %  sodium chloride infusion   Intravenous Continuous Gabriel Earing, PA-C 100 mL/hr at 05/29/20 1433 New Bag at 05/29/20 1433  . acetaminophen (TYLENOL) tablet 325 mg  325 mg Oral Q4H PRN Gabriel Earing, PA-C   325 mg at 05/28/20 1742  . alum & mag hydroxide-simeth (MAALOX/MYLANTA) 200-200-20 MG/5ML suspension 15-30 mL  15-30 mL Oral Q2H PRN Rhyne, Samantha J, PA-C      . bisacodyl (DULCOLAX) suppository 10 mg  10 mg Rectal Daily PRN Rhyne, Samantha J, PA-C      . ceFAZolin (ANCEF) IVPB 1 g/50 mL premix  1 g Intravenous Q8H Rhyne, Samantha J, PA-C 100 mL/hr at 05/29/20 2241 1 g at 05/29/20 2241  . diphenhydrAMINE (BENADRYL) capsule 25 mg  25 mg Oral Q6H PRN Cherre Robins, MD   25 mg at 05/29/20 1643  . guaiFENesin-dextromethorphan (ROBITUSSIN DM) 100-10 MG/5ML syrup 15 mL  15 mL Oral Q4H PRN Rhyne, Samantha J, PA-C      . heparin injection 5,000 Units  5,000 Units Subcutaneous Q8H Rhyne, Samantha J, PA-C   5,000 Units at 05/29/20 2241  . hydrALAZINE (APRESOLINE) injection 5 mg  5 mg Intravenous Q20 Min PRN Rhyne, Hulen Shouts, PA-C      . HYDROmorphone  (DILAUDID) injection 0.5 mg  0.5 mg Intravenous Q3H PRN Rhyne, Samantha J, PA-C      . labetalol (NORMODYNE) injection 10 mg  10 mg Intravenous Q10 min PRN Rhyne, Samantha J, PA-C      . metoprolol tartrate (LOPRESSOR) injection 2-5 mg  2-5 mg Intravenous Q2H PRN Rhyne, Samantha J, PA-C      . nicotine (NICODERM CQ - dosed in mg/24 hours) patch 21 mg  21 mg Transdermal Daily Cherre Robins, MD   21 mg at 05/29/20 1642  . ondansetron (ZOFRAN) injection 4 mg  4 mg Intravenous Q6H PRN Rhyne, Samantha J, PA-C   4 mg at 05/28/20 1756  . oxyCODONE-acetaminophen (PERCOCET/ROXICET) 5-325 MG per tablet 1-2 tablet  1-2 tablet Oral Q6H PRN Gabriel Earing, PA-C   1 tablet at 05/30/20 0431  . pantoprazole (PROTONIX) EC tablet 40 mg  40 mg Oral Daily Rhyne, Samantha J, PA-C   40 mg at 05/29/20 1028  . phenol (CHLORASEPTIC) mouth spray 1 spray  1 spray Mouth/Throat PRN Rhyne, Samantha J, PA-C      . polyethylene glycol (MIRALAX /  GLYCOLAX) packet 17 g  17 g Oral Daily PRN Rhyne, Hulen Shouts, PA-C        Allergies:   Penicillins    Social History:  The patient  reports that she has been smoking cigarettes. She has a 53.00 pack-year smoking history. She has never used smokeless tobacco. She reports that she does not drink alcohol and does not use drugs.   Family History:  The patient's ***family history includes Asthma in her mother; Breast cancer in her cousin and maternal aunt; Heart attack in her father; Heart failure in her mother; Hypertension in her mother.    ROS:  Please see the history of present illness.   Otherwise, review of systems are positive for {NONE DEFAULTED:18576::"none"}.   All other systems are reviewed and negative.    PHYSICAL EXAM: VS:  There were no vitals taken for this visit. , BMI There is no height or weight on file to calculate BMI. GEN: Well nourished, well developed, in no acute distress HEENT: normal Neck: no JVD, carotid bruits, or masses Cardiac: ***RRR; no murmurs,  rubs, or gallops,no edema  Respiratory:  clear to auscultation bilaterally, normal work of breathing GI: soft, nontender, nondistended, + BS MS: no deformity or atrophy Skin: warm and dry, no rash Neuro:  Strength and sensation are intact Psych: euthymic mood, full affect   EKG:  EKG {ACTION; IS/IS IHK:74259563} ordered today. The ekg ordered today demonstrates ***   Recent Labs: 02/19/2020: TSH 1.270 05/16/2020: Magnesium 2.1 05/28/2020: ALT 18; BUN 17; Creatinine, Ser 0.73; Hemoglobin 11.0; Platelets 277; Potassium 3.0; Sodium 138    Lipid Panel    Component Value Date/Time   CHOL 170 02/19/2020 1604   TRIG 62 05/06/2020 0501   HDL 51 02/19/2020 1604   CHOLHDL 3.3 02/19/2020 1604   CHOLHDL 3.3 01/06/2017 0643   VLDL 10 01/06/2017 0643   LDLCALC 105 (H) 02/19/2020 1604      Wt Readings from Last 3 Encounters:  05/28/20 193 lb (87.5 kg)  05/18/20 199 lb 4.8 oz (90.4 kg)  05/10/20 185 lb (83.9 kg)      Other studies Reviewed: Additional studies/ records that were reviewed today include:   Echo 05/02/20: 1. Left ventricular ejection fraction, by estimation, is 60 to 65%. The  left ventricle has normal function. The left ventricle has no regional  wall motion abnormalities. Left ventricular diastolic parameters are  consistent with Grade I diastolic  dysfunction (impaired relaxation). Elevated left atrial pressure.  2. Right ventricular systolic function is normal. The right ventricular  size is normal.  3. The mitral valve is normal in structure. No evidence of mitral valve  regurgitation. No evidence of mitral stenosis.  4. The aortic valve is tricuspid. Aortic valve regurgitation is moderate.  5. Descending aorta not well visualized.  6. The inferior vena cava is normal in size with greater than 50%  respiratory variability, suggesting right atrial pressure of 3 mmHg    ASSESSMENT AND PLAN:  1.  ***   Current medicines are reviewed at length with the  patient today.  The patient {ACTIONS; HAS/DOES NOT HAVE:19233} concerns regarding medicines.  The following changes have been made:  {PLAN; NO CHANGE:13088:s}  Labs/ tests ordered today include: *** No orders of the defined types were placed in this encounter.    Disposition:   FU with *** in {gen number 8-75:643329} {Days to years:10300}  Signed, Abigail Butts, PA-C  05/30/2020 5:54 AM

## 2020-05-31 ENCOUNTER — Inpatient Hospital Stay (HOSPITAL_COMMUNITY): Payer: Medicare Other

## 2020-05-31 ENCOUNTER — Ambulatory Visit: Payer: Federal, State, Local not specified - PPO | Admitting: Medical

## 2020-05-31 DIAGNOSIS — I10 Essential (primary) hypertension: Secondary | ICD-10-CM

## 2020-05-31 DIAGNOSIS — E7800 Pure hypercholesterolemia, unspecified: Secondary | ICD-10-CM

## 2020-05-31 DIAGNOSIS — Z720 Tobacco use: Secondary | ICD-10-CM

## 2020-05-31 DIAGNOSIS — I7101 Dissection of thoracic aorta: Secondary | ICD-10-CM

## 2020-05-31 DIAGNOSIS — I2510 Atherosclerotic heart disease of native coronary artery without angina pectoris: Secondary | ICD-10-CM

## 2020-05-31 LAB — CBC
HCT: 32.7 % — ABNORMAL LOW (ref 36.0–46.0)
Hemoglobin: 10.4 g/dL — ABNORMAL LOW (ref 12.0–15.0)
MCH: 29 pg (ref 26.0–34.0)
MCHC: 31.8 g/dL (ref 30.0–36.0)
MCV: 91.1 fL (ref 80.0–100.0)
Platelets: 257 10*3/uL (ref 150–400)
RBC: 3.59 MIL/uL — ABNORMAL LOW (ref 3.87–5.11)
RDW: 12.9 % (ref 11.5–15.5)
WBC: 5.9 10*3/uL (ref 4.0–10.5)
nRBC: 0 % (ref 0.0–0.2)

## 2020-05-31 LAB — RESPIRATORY PANEL BY PCR

## 2020-05-31 LAB — COMPREHENSIVE METABOLIC PANEL
ALT: 11 U/L (ref 0–44)
AST: 17 U/L (ref 15–41)
Albumin: 2.4 g/dL — ABNORMAL LOW (ref 3.5–5.0)
Alkaline Phosphatase: 49 U/L (ref 38–126)
Anion gap: 9 (ref 5–15)
BUN: <5 mg/dL — ABNORMAL LOW (ref 8–23)
CO2: 25 mmol/L (ref 22–32)
Calcium: 8.6 mg/dL — ABNORMAL LOW (ref 8.9–10.3)
Chloride: 101 mmol/L (ref 98–111)
Creatinine, Ser: 0.61 mg/dL (ref 0.44–1.00)
GFR, Estimated: >60 mL/min (ref >60)
Glucose, Bld: 128 mg/dL — ABNORMAL HIGH (ref 70–99)
Potassium: 2.8 mmol/L — ABNORMAL LOW (ref 3.5–5.1)
Sodium: 135 mmol/L (ref 135–145)
Total Bilirubin: 0.6 mg/dL (ref 0.3–1.2)
Total Protein: 6.4 g/dL — ABNORMAL LOW (ref 6.5–8.1)

## 2020-05-31 MED ORDER — POTASSIUM CHLORIDE CRYS ER 20 MEQ PO TBCR
20.0000 meq | EXTENDED_RELEASE_TABLET | Freq: Once | ORAL | Status: AC
  Administered 2020-05-31: 40 meq via ORAL
  Filled 2020-05-31: qty 2

## 2020-05-31 MED ORDER — POTASSIUM CHLORIDE CRYS ER 20 MEQ PO TBCR
40.0000 meq | EXTENDED_RELEASE_TABLET | Freq: Once | ORAL | Status: AC
  Administered 2020-06-01: 40 meq via ORAL
  Filled 2020-05-31: qty 2

## 2020-05-31 MED ORDER — CARVEDILOL 6.25 MG PO TABS
6.2500 mg | ORAL_TABLET | Freq: Two times a day (BID) | ORAL | Status: DC
  Administered 2020-05-31 – 2020-06-04 (×9): 6.25 mg via ORAL
  Filled 2020-05-31 (×9): qty 1

## 2020-05-31 MED ORDER — AMLODIPINE BESYLATE 5 MG PO TABS
5.0000 mg | ORAL_TABLET | Freq: Every day | ORAL | Status: DC
Start: 2020-05-31 — End: 2020-06-04
  Administered 2020-05-31 – 2020-06-04 (×5): 5 mg via ORAL
  Filled 2020-05-31 (×5): qty 1

## 2020-05-31 NOTE — Progress Notes (Addendum)
   VASCULAR SURGERY ASSESSMENT & PLAN:   70 y.o. female admitted with failure to thrive after bilateral carotid-subclavian transposition and TEVAR for type B aortic dissection.Fluid resuscitated. Tm 100.0.  Systolic pressure [id number] restart her home meds K+ is 3.0>replete WBC normal. Hand H improved.  SUBJECTIVE:   Complaining of mild nausea and right-sided headache this morning. Says she started feeling "hot and cold" beginning yesterday evening.   PHYSICAL EXAM:   Vitals:   05/30/20 1203 05/30/20 1629 05/30/20 1935 05/30/20 2324  BP:  (!) 158/75 (!) 156/74 (!) 153/76  Pulse:  95 86 85  Resp:  16 18 18   Temp: 99 F (37.2 C) 98.7 F (37.1 C) 100 F (37.8 C) 98.7 F (37.1 C)  TempSrc: Axillary Oral Oral Oral  SpO2:  96% 97% 95%   General appearance: Awake, alert in no apparent distress Cardiac: Heart rate and rhythm are regular Respirations: Nonlabored Incisions: Right groin, bilateral upper anterior chest incisions are all well approximated without bleeding or hematoma. No drainage.  Extremities: Both feet are warm with intact sensation and motor function. 3+ right DP and 3+ left PT pulses.    Low grade fever 100. Non specific complaints, occasional chest pain mild some nausea. Physical exam as above  Repeat labs today Check COVID Check chest xray OT PT for deconditioning Monitor BP if continues to be high will have Dr Oval Linsey review  [name], MD [facility] [place] Office: [phone]   LABS:   Lab Results  Component Value Date   WBC 7.0 05/28/2020   HGB 11.0 (L) 05/28/2020   HCT 34.3 (L) 05/28/2020   MCV 91.2 05/28/2020   PLT 277 05/28/2020   Lab Results  Component Value Date   CREATININE 0.73 05/28/2020   Lab Results  Component Value Date   INR 1.5 (H) 05/14/2020   CBG (last 3)  No results for input(s): GLUCAP in the last 72 hours.  PROBLEM LIST:    Active Problems:   History of repair of dissecting  thoracic aneurysm   CURRENT MEDS:   . heparin  5,000 Units Subcutaneous Q8H  . nicotine  21 mg Transdermal Daily  . pantoprazole  40 mg Oral Daily   Barbie Banner, [place] Office: [phone] 05/31/2020

## 2020-05-31 NOTE — H&P (Signed)
  POST OPERATIVE OFFICE NOTE    CC:  F/u for surgery  HPI:  This is a 70 y.o. female who is s/p Bilateral carotid subclavian bypass and TEVAR on 05/14/2020 by Dr. Oneida Alar.    Pt returns today for follow up and here with her husband.  Pt states she overall doesn't feel good.  She states that she has tightness around her clavicular incisions and in her chest.  She occasionally feels sharp pains in her abdomen, mostly on the right lateral side.  She also has c/o tingling in her right thigh.  She states she isn't eating much that she doesn't have much of an appetite and states she doesn't feel like she is making much urine.  She states she hasn't had a BM in a couple of days.  She hasn't had any fevers.  He BP is low today. She also feels dizzy.  Pt states she hasn't showered bc she was told not to.       Allergies  Allergen Reactions  . Penicillins Other (See Comments)    chills          Current Outpatient Medications  Medication Sig Dispense Refill  . acetaminophen (TYLENOL) 500 MG tablet Take 1,000 mg by mouth every 6 (six) hours as needed for mild pain.    Marland Kitchen amLODipine (NORVASC) 5 MG tablet Take 1 tablet (5 mg total) by mouth daily. 30 tablet 0  . carvedilol (COREG) 6.25 MG tablet Take 1 tablet (6.25 mg total) by mouth 2 (two) times daily with a meal. 60 tablet 1  . hydrochlorothiazide (HYDRODIURIL) 12.5 MG tablet Take 2 tablets (25 mg total) by mouth daily. 60 tablet 0  . oxyCODONE-acetaminophen (PERCOCET/ROXICET) 5-325 MG tablet Take 1 tablet by mouth every 6 (six) hours as needed for moderate pain. 30 tablet 0  . rosuvastatin (CRESTOR) 5 MG tablet Take 1 tablet (5 mg total) by mouth daily. 30 tablet 5   No current facility-administered medications for this visit.     ROS:  See HPI  Physical Exam:     Today's Vitals   05/28/20 1207  BP: 92/65  Pulse: 76  Resp: 20  Temp: 98.3 F (36.8 C)  TempSrc: Temporal  SpO2: 100%  Weight: 193 lb (87.5 kg)  Height: 5'  5" (1.651 m)  PainSc: 4    Body mass index is 32.12 kg/m.   Incision:  Right clavicular incision with mild erythema.  Left claviciular and right groin incisions look good.  Extremities:  Easily palpable bilateral DP/PT/radial/ulnar Abdomen:  soft   Assessment/Plan:  This is a 70 y.o. female who is s/p: Bilateral carotid subclavian bypass and TEVAR  -pt with failure to thrive not eating much and by report not making much urine.  Will admit to hospital for hydration, labs and CTA to evaluate stent graft.  She has bed on 4E.  Will also put on IV abx for some erythema and recent drainage from right clavicular incision. -feel the tingling in the right thigh is due to nerve irritation from right groin cut down.  Discussed this with pt and husband.  She has easily palpable pulses bilateral feet and hands.  -discussed with pt that it is ok to shower.  Discussed keeping right groin dry at all other times.   Leontine Locket, [facility] [phone]

## 2020-05-31 NOTE — Progress Notes (Signed)
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  Pt received B subclavian transposition bypass.  PMhx: HTN, CVA, anemia    PT Comments    Pt is up to walk with controlled HR and sats, but very tired after completion of the trip.  Her initial expectation is that the walk will not be very strenuous, but after so fatigued have deferred stairs to tomorrow.  Her plan is to get home from [place] is still a good idea to monitor her tolerance of activity.  Continue on with goals of PT acutely.   Follow Up Recommendations  Home health PT     Equipment Recommendations  None recommended by PT    Recommendations for Other Services       Precautions / Restrictions Precautions Precautions: Fall Restrictions Weight Bearing Restrictions: No    Mobility  Bed Mobility Overal bed mobility: Modified Independent             General bed mobility comments: requires extra time and uses elevation on bed for comfort    Transfers Overall transfer level: Needs assistance Equipment used: Rolling walker (2 wheeled) Transfers: Sit to/from Stand Sit to Stand: Supervision            Ambulation/Gait Ambulation/Gait assistance: Min guard Gait Distance (Feet): 150 Feet Assistive device: Rolling walker (2 wheeled) Gait Pattern/deviations: Step-through pattern;Decreased stride length Gait velocity: decreased Gait velocity interpretation: <1.31 ft/sec, indicative of household ambulator General Gait Details: steady on RW, using cues for pace with sats remaining in 90's   Stairs             Wheelchair Mobility    Modified Rankin (Stroke Patients Only)       Balance Overall balance assessment: Needs assistance Sitting-balance support: Feet  supported Sitting balance-Leahy Scale: Good     Standing balance support: Bilateral upper extremity supported;During functional activity Standing balance-Leahy Scale: Fair                              Cognition Arousal/Alertness: Awake/alert Behavior During Therapy: WFL for tasks assessed/performed Overall Cognitive Status: Within Functional Limits for tasks assessed                                        Exercises      General Comments General comments (skin integrity, edema, etc.): Pt was seen for mobility on RW with sats remaining in 90's and no sob with the walk      Pertinent Vitals/Pain Pain Assessment: No/denies pain    Home Living                      Prior Function            PT Goals (current goals can now be found in the care plan section) Acute Rehab PT Goals Patient Stated Goal: to go home Progress towards PT goals: Progressing toward goals    Frequency    Min 3X/week      PT Plan Current plan remains appropriate    Co-evaluation              AM-PAC PT "6 Clicks"  Mobility   Outcome Measure  Help needed turning from your back to your side while in a flat bed without using bedrails?: None Help needed moving from lying on your back to sitting on the side of a flat bed without using bedrails?: None Help needed moving to and from a bed to a chair (including a wheelchair)?: None Help needed standing up from a chair using your arms (e.g., wheelchair or bedside chair)?: A Little Help needed to walk in hospital room?: A Little Help needed climbing 3-5 steps with a railing? : A Little 6 Click Score: 21    End of Session Equipment Utilized During Treatment: Gait belt Activity Tolerance: Patient tolerated treatment well Patient left: in bed;with call bell/phone within reach Nurse Communication: Mobility status PT Visit Diagnosis: Other abnormalities of gait and mobility (R26.89);Difficulty in walking, not  elsewhere classified (R26.2)     Time: 4709-2957 PT Time Calculation (min) (ACUTE ONLY): 19 min  Charges:  $Gait Training: 8-22 mins $Therapeutic Activity: 8-22 mins                   Ramond Dial 05/31/2020, 2:26 PM Mee Hives, PT MS Acute Rehab Dept. Number: [place] and [place]

## 2020-06-01 LAB — ALDOSTERONE + RENIN ACTIVITY W/ RATIO
ALDO / PRA Ratio: 4.1 (ref 0.0–30.0)
ALDO / PRA Ratio: UNDETERMINED
Aldosterone: 8.5 ng/dL (ref 0.0–30.0)
Aldosterone: <1 ng/dL (ref 0.0–30.0)
PRA LC/MS/MS: 2.054 ng/mL/hr (ref 0.167–5.380)
PRA LC/MS/MS: <0.167 ng/mL/hr — ABNORMAL LOW (ref 0.167–5.380)

## 2020-06-01 LAB — BASIC METABOLIC PANEL
Anion gap: 10 (ref 5–15)
BUN: 5 mg/dL — ABNORMAL LOW (ref 8–23)
CO2: 26 mmol/L (ref 22–32)
Calcium: 9.2 mg/dL (ref 8.9–10.3)
Chloride: 101 mmol/L (ref 98–111)
Creatinine, Ser: 0.59 mg/dL (ref 0.44–1.00)
GFR, Estimated: >60 mL/min (ref >60)
Glucose, Bld: 109 mg/dL — ABNORMAL HIGH (ref 70–99)
Potassium: 3 mmol/L — ABNORMAL LOW (ref 3.5–5.1)
Sodium: 137 mmol/L (ref 135–145)

## 2020-06-01 MED ORDER — HYDROCORTISONE (PERIANAL) 2.5 % EX CREA
TOPICAL_CREAM | CUTANEOUS | Status: DC | PRN
  Filled 2020-06-01: qty 28.35

## 2020-06-01 MED ORDER — ROSUVASTATIN CALCIUM 5 MG PO TABS
5.0000 mg | ORAL_TABLET | Freq: Every day | ORAL | Status: DC
  Administered 2020-06-01 – 2020-06-04 (×4): 5 mg via ORAL
  Filled 2020-06-01 (×4): qty 1

## 2020-06-01 MED ORDER — LOPERAMIDE HCL 2 MG PO CAPS
2.0000 mg | ORAL_CAPSULE | ORAL | Status: DC | PRN
  Administered 2020-06-01 – 2020-06-02 (×2): 2 mg via ORAL
  Filled 2020-06-01 (×2): qty 1

## 2020-06-01 MED ORDER — WITCH HAZEL-GLYCERIN EX PADS
MEDICATED_PAD | CUTANEOUS | Status: DC | PRN
  Filled 2020-06-01: qty 100

## 2020-06-01 NOTE — Care Management Important Message (Signed)
Important Message  Patient Details  Name: Angel Lane MRN: [id number] Date of Birth: 1951-03-04   Medicare Important Message Given:  Yes     [name] 06/01/2020, 1:43 PM

## 2020-06-01 NOTE — Evaluation (Signed)
Occupational Therapy Evaluation Patient Details Name: Angel Lane MRN: [id number] DOB: January 09, 1951 Today's Date: 06/01/2020    History of Present Illness 70 yo admitted 3/7 with type B aortic dissection s/p bil carotid subclavian transposition and TEVAR for dissection on 3/11.  Pt received B subclavian transposition bypass.  PMhx: HTN, CVA, anemia   Clinical Impression   Pt PTA: Pt living with spouse and reports independence; pt has daughter who is a cook that enjoys cooking for pt. Pt currently, performing ADL at sink with no difficulty with modified independence. Pt props single LE on EOB for LB bathing/dressing. Pt uses [place] in shower at home. Pt ambulating in room and hallway ~200' with no difficulty. Pt's HR 90s with mobility, on RA >90% O2 and no SOB noted. Pt appears at functional baseline. Pt does not require continued OT skilled services. OT signing off. Thank you.    Follow Up Recommendations  No OT follow up    Equipment Recommendations  None recommended by OT    Recommendations for Other Services       Precautions / Restrictions Precautions Precautions: Fall Restrictions Weight Bearing Restrictions: No      Mobility Bed Mobility Overal bed mobility: Modified Independent             General bed mobility comments: requires extra time and uses elevation on bed for comfort    Transfers Overall transfer level: Needs assistance Equipment used: None Transfers: Sit to/from Stand Sit to Stand: Modified independent (Device/Increase time)         General transfer comment: no physical assist required    Balance Overall balance assessment: Needs assistance Sitting-balance support: Feet supported Sitting balance-Leahy Scale: Good     Standing balance support: Bilateral upper extremity supported;During functional activity Standing balance-Leahy Scale: Fair                             ADL either performed or assessed with clinical judgement    ADL Overall ADL's : Modified independent                                     Functional mobility during ADLs: Modified independent General ADL Comments: Pt performing ADL at sink with no difficulty; pt standing. Pt props single LE on EOB for LB bathing/dressing. Pt uses [place] in shower at home. Pt ambulating in room with no difficulty.     Vision Baseline Vision/History: No visual deficits Patient Visual Report: No change from baseline Vision Assessment?: No apparent visual deficits     Perception     Praxis      Pertinent Vitals/Pain Pain Assessment: No/denies pain     Hand Dominance Right   Extremity/Trunk Assessment Upper Extremity Assessment Upper Extremity Assessment: Overall WFL for tasks assessed   Lower Extremity Assessment Lower Extremity Assessment: Overall WFL for tasks assessed LLE Deficits / Details: LLE weakness from previous CVA   Cervical / Trunk Assessment Cervical / Trunk Assessment: Normal   Communication Communication Communication: HOH   Cognition Arousal/Alertness: Awake/alert Behavior During Therapy: WFL for tasks assessed/performed Overall Cognitive Status: Within Functional Limits for tasks assessed                                 General Comments: A/O x4. Anxious about her hemorrhoids burning, but after the  pain resolved, pt was pleasant and cooperative.   General Comments  Pt's HR 90s with mobility, on RA >90% O2 and no SOB noted. Pt appears at functional baseline.    Exercises     Shoulder Instructions      Home Living Family/patient expects to be discharged to:: Private residence Living Arrangements: Spouse/significant other Available Help at Discharge: Available 24 hours/day Type of Home: House Home Access: Stairs to enter [employer] of Steps: 2 Entrance Stairs-Rails: Right;Left Home Layout: One level     Bathroom Shower/Tub: Walk-in shower;Door   [employer] Toilet: Handicapped  height Bathroom Accessibility: Yes How Accessible: Accessible via walker Home Equipment: Bedside commode;Shower seat;Walker - 2 wheels;Hand held shower head          Prior Functioning/Environment Level of Independence: Independent        Comments: L side a little weak from CVA; drives; independently manages medication        OT Problem List:        OT Treatment/Interventions:      OT Goals(Current goals can be found in the care plan section) Acute Rehab OT Goals Patient Stated Goal: to go home  OT Frequency:     Barriers to D/C:            Co-evaluation              AM-PAC OT "6 Clicks" Daily Activity     Outcome Measure Help from another person eating meals?: None Help from another person taking care of personal grooming?: None Help from another person toileting, which includes using toliet, bedpan, or urinal?: None Help from another person bathing (including washing, rinsing, drying)?: None Help from another person to put on and taking off regular upper body clothing?: None Help from another person to put on and taking off regular lower body clothing?: None 6 Click Score: 24   End of Session Nurse Communication: Mobility status  Activity Tolerance: Patient tolerated treatment well Patient left: in bed;with call bell/phone within reach  OT Visit Diagnosis: Unsteadiness on feet (R26.81)                Time: 8315-1761 OT Time Calculation (min): 20 min Charges:  OT General Charges $OT Visit: 1 Visit OT Evaluation $OT Eval Moderate Complexity: 1 Mod  Jefferey Pica, OTR/L Acute Rehabilitation Services Pager: [phone] Office: [phone]   Melody Cirrincione  C 06/01/2020, 2:34 PM

## 2020-06-01 NOTE — TOC Transition Note (Signed)
Transition of Care (TOC) - CM/SW Discharge Note Marvetta Gibbons RN, BSN Transitions of Care Unit 4E- RN Case Manager See Treatment Team for direct phone #    Patient Details  Name: Angel Lane MRN: [id number] Date of Birth: 1950-03-26  Transition of Care [facility]) CM/SW Contact:  Dawayne Patricia, RN Phone Number: 06/01/2020, 2:01 PM   Clinical Narrative:    Pt from home with family, active with [facility] for [facility] services, has home 02 with [employer].  Pt has needed DME at home, family to transport home.  Order has been placed to resume HHPT needs at home, contacted [facility] with Alvis Lemmings to confirm resumption of [facility] services with them.      Final next level of care: [place] Barriers to Discharge: No Barriers Identified   Patient Goals and CMS Choice Patient states their goals for this hospitalization and ongoing recovery are:: return home CMS Medicare.gov Compare Post Acute Care list provided to:: Patient Choice offered to / list presented to : Patient  Discharge Placement                Home with [facility]        Discharge Plan and Services In-house Referral: NA Discharge Planning Services: CM Consult Post Acute Care Choice: Home Health,Resumption of Svcs/PTA Provider          DME Arranged: N/A DME Agency: NA       HH Arranged: PT HH Agency: [place] Date [place]: 06/01/20 Time HH Agency Contacted: 1000 Representative spoke with at [place]: [place] ([place]) Interventions     Readmission Risk Interventions Readmission Risk Prevention Plan 06/01/2020  Transportation Screening Complete  PCP or Specialist Appt within 5-7 Days Complete  Home Care Screening Complete  Medication Review (RN CM) Complete  Some recent data might be hidden

## 2020-06-01 NOTE — Progress Notes (Addendum)
PT Cancellation Note  Patient Details Name: Angel Lane MRN: [id number] DOB: [birth year]   Cancelled Treatment:    Reason Eval/Treat Not Completed: (P) Patient declined, no reason specified;Other (comment) (pt reporting bowel incontinence after taking miralax and hemorrhoid pain, deferring OOB at this time.) Will continue efforts next date per PT POC as schedule permits.   Kara Pacer Monesha Monreal 06/01/2020, 4:39 PM

## 2020-06-01 NOTE — Progress Notes (Signed)
Patients pain scale is 9/10. Patient states she feels like there are hemorrhoids inside of her rectum.  Medication given.  Daymon Larsen, RN

## 2020-06-01 NOTE — Discharge Summary (Signed)
Discharge Summary    LORIN HAUCK 1950/12/21 70 y.o. female  [id number]  Admission Date: 05/28/2020  Discharge Date: 06/09/2020  Physician: No att. providers found  Admission Diagnosis: History of repair of dissecting thoracic aneurysm [Z98.890, Z86.79]   HPI:   This is a 70 y.o. female who is s/p Bilateral carotid subclavian bypass and TEVARon 3/11/2022by Dr. Oneida Alar.   Pt returns today for follow upand here with her husband. Pt states she overall doesn't feel good. She states that she has tightness around her clavicular incisions and in her chest. She occasionally feels sharp pains in her abdomen, mostly on the right lateral side. She also has c/o tingling in her right thigh. She states she isn't eating much that she doesn't have much of an appetite and states she doesn't feel like she is making much urine. She states she hasn't had a BM in a couple of days. She hasn't had any fevers. He BP is low today. She also feels dizzy. Pt states she hasn't showered bc she was told not to.  Hospital Course:  The patient was admitted to the hospital and started on IV abx as well as IVF.  Her labs were good.  Over her hospital course, her abdominal pain resolved.  Incisions looked good and abx discontinued. She was going to discharge, but did have a hemorrhoid flare.  GI was consulted.  5% lidocaine to hemorroidal area and increase miralax to help with constipation.  The pt was disimpacted, difficulty tolerated but she did notice improvement.  By the next day, she was feeling much better and was discharged home.     CBC    Component Value Date/Time   WBC 5.9 05/31/2020 0917   RBC 3.59 (L) 05/31/2020 0917   HGB 10.4 (L) 05/31/2020 0917   HGB 13.4 02/19/2020 1604   HCT 32.7 (L) 05/31/2020 0917   HCT 40.8 02/19/2020 1604   PLT 257 05/31/2020 0917   PLT 200 02/19/2020 1604   MCV 91.1 05/31/2020 0917   MCV 91 02/19/2020 1604   MCV 90 10/29/2011 1553   MCH 29.0 05/31/2020  0917   MCHC 31.8 05/31/2020 0917   RDW 12.9 05/31/2020 0917   RDW 12.3 02/19/2020 1604   RDW 13.2 10/29/2011 1553   LYMPHSABS 2.2 05/10/2020 1001   MONOABS 0.9 05/10/2020 1001   EOSABS 0.1 05/10/2020 1001   BASOSABS 0.0 05/10/2020 1001    BMET    Component Value Date/Time   NA 138 06/02/2020 1017   NA 141 02/19/2020 1604   NA 141 10/29/2011 1553   K 3.5 06/02/2020 1017   K 3.3 (L) 10/29/2011 1553   CL 105 06/02/2020 1017   CL 108 (H) 10/29/2011 1553   CO2 24 06/02/2020 1017   CO2 26 10/29/2011 1553   GLUCOSE 109 (H) 06/02/2020 1017   GLUCOSE 104 (H) 10/29/2011 1553   BUN 9 06/02/2020 1017   BUN 17 02/19/2020 1604   BUN 18 10/29/2011 1553   CREATININE 0.59 06/02/2020 1017   CREATININE 0.87 10/29/2011 1553   CALCIUM 9.2 06/02/2020 1017   CALCIUM 9.1 10/29/2011 1553   GFRNONAA >60 06/02/2020 1017   GFRNONAA >60 10/29/2011 1553   GFRAA 78 02/19/2020 1604   GFRAA >60 10/29/2011 1553      Discharge Instructions    Call MD for:  redness, tenderness, or signs of infection (pain, swelling, bleeding, redness, odor or green/yellow discharge around incision site)   Complete by: As directed    Call MD for:  severe or increased pain, loss or decreased feeling  in affected limb(s)   Complete by: As directed    Call MD for:  temperature >100.5   Complete by: As directed    Discharge wound care:   Complete by: As directed    Shower daily with soap and water starting 06/01/2020   Driving Restrictions   Complete by: As directed    No driving until returning for post operative visit   Lifting restrictions   Complete by: As directed    No heavy lifting for 4 weeks   Resume previous diet   Complete by: As directed       Discharge Diagnosis:  History of repair of dissecting thoracic aneurysm [C37.628, Z86.79]  Secondary Diagnosis: Patient Active Problem List   Diagnosis Date Noted  . Thoracic aortic dissection ([place]) 05/28/2020  . History of repair of dissecting thoracic  aneurysm 05/28/2020  . Hypertensive emergency   . Coronary artery calcification   . Aortic dissection distal to left subclavian ([place]) 05/10/2020  . HCAP (healthcare-associated pneumonia) 05/08/2020  . Acute respiratory failure with hypoxia ([place]) 05/08/2020  . Dissecting aneurysm of thoracic aorta, [place] type B ([place]) 05/01/2020  . History of CVA (cerebrovascular accident) 02/19/2020  . OSA on CPAP 02/19/2020  . DOE (dyspnea on exertion) 02/19/2020  . Encounter for annual physical exam 02/19/2020  . Breast cancer screening by mammogram 02/19/2020  . Palpitations 02/19/2020  . Left hip pain 02/19/2020  . Avitaminosis D 07/02/2019  . Trochanteric bursitis of both hips 07/02/2019  . Impacted cerumen of right ear 07/02/2019  . Pure hypercholesterolemia 10/08/2017  . Hyperglycemia 10/08/2017  . Insomnia 03/23/2017  . Tobacco abuse disorder 01/15/2017  . Left hemiparesis ([place]) 01/06/2017  . HTN (hypertension) 01/06/2017   Past Medical History:  Diagnosis Date  . Anemia   . Depression   . Dyspnea    due to stroke  . Hypertension   . Neuromuscular disorder ([place])    left sided weakness from stroke  . Sleep apnea   . Stroke [facility])    November 2018      Allergies as of 06/04/2020      Reactions   Penicillins Other (See Comments)   chills      Medication List    TAKE these medications   acetaminophen 500 MG tablet Commonly known as: TYLENOL Take 500 mg by mouth every 6 (six) hours as needed for moderate pain.   amLODipine 5 MG tablet Commonly known as: NORVASC Take 1 tablet (5 mg total) by mouth daily.   carvedilol 6.25 MG tablet Commonly known as: COREG Take 1 tablet (6.25 mg total) by mouth 2 (two) times daily with a meal.   hydrochlorothiazide 12.5 MG tablet Commonly known as: HYDRODIURIL Take 2 tablets (25 mg total) by mouth daily.   nicotine 21 mg/24hr patch Commonly known as: NICODERM CQ - dosed in mg/24 hours Place 1 patch (21 mg total) onto the skin  daily.   oxyCODONE-acetaminophen 5-325 MG tablet Commonly known as: PERCOCET/ROXICET Take 1 tablet by mouth every 6 (six) hours as needed for moderate pain.   rosuvastatin 5 MG tablet Commonly known as: Crestor Take 1 tablet (5 mg total) by mouth daily.            Discharge Care Instructions  (From admission, onward)         Start     Ordered   06/01/20 0000  Discharge wound care:       Comments: Shower daily  with soap and water starting 06/01/2020   06/01/20 1302          Prescriptions given: None given  Instructions: 1.  No driving until return for post op visit 2.  No heavy lifting x 4 weeks 3.  Shower daily starting 06/01/2020  Disposition: home  Patient's condition: is Good  Follow up: 1. Dr. Oneida Alar on 06/17/2020 (does not need CTA as she had one in hospital)   Leontine Locket, PA-C [facility] [phone] 06/09/2020  9:14 AM

## 2020-06-01 NOTE — Progress Notes (Addendum)
Patient is experiencing severe diarrhea as well as hemorrhoids. Given tucks pads. Pt stated she feels it is not the right choice to go home feeling this bad at this time. Paged MD. Waiting on return call.  Daymon Larsen, RN    ** paged MD again @ [phone]

## 2020-06-01 NOTE — Progress Notes (Addendum)
  Progress Note    06/01/2020 7:27 AM   Subjective:  Feels much better; says she can tell her blood pressure is up bc she has a headache when it is elevated.  Says she walked yesterday.    Tm 99.2 now afebrile HR 70's-80's NSR 706'C-376'E systolic 83% RA  Vitals:   05/31/20 2322 06/01/20 0336  BP: (!) 155/74 133/72  Pulse: 84 83  Resp: 17 19  Temp: 99.2 F (37.3 C) 98.4 F (36.9 C)  SpO2: 96% 94%    Physical Exam: Cardiac:  regular Lungs:  Non labored Incisions:  Bilateral supraclavicular incisions are clean and dry Abdomen:  Soft, NT/ND  CBC    Component Value Date/Time   WBC 5.9 05/31/2020 0917   RBC 3.59 (L) 05/31/2020 0917   HGB 10.4 (L) 05/31/2020 0917   HGB 13.4 02/19/2020 1604   HCT 32.7 (L) 05/31/2020 0917   HCT 40.8 02/19/2020 1604   PLT 257 05/31/2020 0917   PLT 200 02/19/2020 1604   MCV 91.1 05/31/2020 0917   MCV 91 02/19/2020 1604   MCV 90 10/29/2011 1553   MCH 29.0 05/31/2020 0917   MCHC 31.8 05/31/2020 0917   RDW 12.9 05/31/2020 0917   RDW 12.3 02/19/2020 1604   RDW 13.2 10/29/2011 1553   LYMPHSABS 2.2 05/10/2020 1001   MONOABS 0.9 05/10/2020 1001   EOSABS 0.1 05/10/2020 1001   BASOSABS 0.0 05/10/2020 1001    BMET    Component Value Date/Time   NA 135 05/31/2020 0917   NA 141 02/19/2020 1604   NA 141 10/29/2011 1553   K 2.8 (L) 05/31/2020 0917   K 3.3 (L) 10/29/2011 1553   CL 101 05/31/2020 0917   CL 108 (H) 10/29/2011 1553   CO2 25 05/31/2020 0917   CO2 26 10/29/2011 1553   GLUCOSE 128 (H) 05/31/2020 0917   GLUCOSE 104 (H) 10/29/2011 1553   BUN <5 (L) 05/31/2020 0917   BUN 17 02/19/2020 1604   BUN 18 10/29/2011 1553   CREATININE 0.61 05/31/2020 0917   CREATININE 0.87 10/29/2011 1553   CALCIUM 8.6 (L) 05/31/2020 0917   CALCIUM 9.1 10/29/2011 1553   GFRNONAA >60 05/31/2020 0917   GFRNONAA >60 10/29/2011 1553   GFRAA 78 02/19/2020 1604   GFRAA >60 10/29/2011 1553    INR    Component Value Date/Time   INR 1.5 (H)  05/14/2020 1930     Intake/Output Summary (Last 24 hours) at 06/01/2020 0727 Last data filed at 05/31/2020 1800 Gross per 24 hour  Intake 970 ml  Output --  Net 970 ml     Assessment:  70 y.o. female was admittedwith failure to thrive after bilateral carotid-subclavian transposition and TEVAR for type B aortic dissection     Plan: -pt doing much better this am -abdominal soreness resolved -hypokalemia yesterday that was supplemented-will recheck BMP this am -consult to [facility] to resume HH needs.  -supraclavicular incisions looks good.  On abx.  Can probably stop these.   -possibly home later today.   -DVT prophylaxis:  Sq heparin   Leontine Locket, PA-C [facility] [phone] 06/01/2020 7:27 AM   Agree with above.  Pt feels better today.  Incisions clean no abdomen pain  D/c home  [name], MD [facility] [place] Office: [phone]

## 2020-06-02 LAB — BASIC METABOLIC PANEL
Anion gap: 9 (ref 5–15)
BUN: 9 mg/dL (ref 8–23)
CO2: 24 mmol/L (ref 22–32)
Calcium: 9.2 mg/dL (ref 8.9–10.3)
Chloride: 105 mmol/L (ref 98–111)
Creatinine, Ser: 0.59 mg/dL (ref 0.44–1.00)
GFR, Estimated: >60 mL/min (ref >60)
Glucose, Bld: 109 mg/dL — ABNORMAL HIGH (ref 70–99)
Potassium: 3.5 mmol/L (ref 3.5–5.1)
Sodium: 138 mmol/L (ref 135–145)

## 2020-06-02 MED ORDER — LIDOCAINE 5 % EX OINT
TOPICAL_OINTMENT | CUTANEOUS | Status: DC | PRN
  Administered 2020-06-02: 1 via TOPICAL
  Filled 2020-06-02: qty 35.44

## 2020-06-02 NOTE — Progress Notes (Signed)
PT Cancellation Note  Patient Details Name: Angel Lane MRN: [id number] DOB: 11/28/1950   Cancelled Treatment:    Reason Eval/Treat Not Completed: Patient not medically ready;Patient declined, no reason specified.  Pt reporting cycles of hemorrhoid pain each of 2 visits and stated she could participate "at this time".  Unable to go back a third time to check on ability to get OOB.  Will see as able 3/31. 06/02/2020  Ginger Carne., PT Acute Rehabilitation Services [phone]  (pager) [phone]  (office)   Tessie Fass Pierre Dellarocco 06/02/2020, 5:16 PM

## 2020-06-02 NOTE — Consult Note (Addendum)
UNASSIGNED PATIENT Reason for Consult: Rectal bleeding from hemorrhoids. Referring Physician: Dr. Murrell Converse, CVTS. Angel Lane is an 70 y.o. female.  HPI: 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  She was given a dose of MiraLAX which caused her to have severe rectal burning and discomfort. She claims she usually has regular bowel movements but since has been in the hospital has not been eating well she has had some constipation.  On reviewing the record she had had a colonoscopy done by Dr. Alice Reichert at [facility] on 01/21/2020 when she was noted to have a prolapsing internal hemorrhoids and scattered sigmoid diverticula.  Past Medical History:  Diagnosis Date  . Anemia   . Depression   . Dyspnea    due to stroke  . Hypertension   . 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 Sleep apnea   . Stroke [facility])    November 2018    Past Surgical History:  Procedure Laterality Date  . ABDOMINAL SURGERY    . CARDIAC CATHETERIZATION    . CAROTID-SUBCLAVIAN BYPASS GRAFT Bilateral 05/14/2020   Procedure: BILATERAL CAROTID-SUBCLAVIAN  TRANSPOSITION;  Surgeon: Elam Dutch, MD;  Location: [facility] OR;  Service: Vascular;  Laterality: Bilateral;  . CATARACT EXTRACTION W/PHACO Left 05/16/2017   Procedure: CATARACT EXTRACTION PHACO AND INTRAOCULAR LENS PLACEMENT ([place]) LEFT;  Surgeon: Leandrew Koyanagi, MD;  Location: [place];  Service: Ophthalmology;  Laterality: Left;  . CATARACT EXTRACTION W/PHACO Right 06/13/2017   Procedure: CATARACT EXTRACTION PHACO AND INTRAOCULAR LENS PLACEMENT ([place]) RIGHT;  Surgeon: Leandrew Koyanagi, MD;  Location: [place];  Service: Ophthalmology;  Laterality: Right;  sleep apnea  . CHOLECYSTECTOMY    . COLONOSCOPY N/A 01/21/2020   Procedure: COLONOSCOPY;  Surgeon: Toledo, Benay Pike, MD;  Location: [facility] ENDOSCOPY;  Service: Gastroenterology;  Laterality: N/A;  . THORACIC AORTIC ENDOVASCULAR STENT GRAFT N/A 05/14/2020   Procedure: THORACIC AORTIC ENDOVASCULAR STENT USING GORE TAG 24mm X 15cm GRAFT AND GORE TAG 24mm X 20cm GRAFT;  Surgeon: Elam Dutch, MD;  Location: [place];  Service: Vascular;  Laterality: N/A;  . ULTRASOUND GUIDANCE FOR VASCULAR ACCESS Bilateral 05/14/2020   Procedure: ULTRASOUND GUIDANCE FOR VASCULAR ACCESS;  Surgeon: Elam Dutch, MD;  Location: [facility] OR;  Service: Vascular;  Laterality: Bilateral;   Family History  Problem Relation Age of Onset  . Asthma Mother   . Heart failure Mother   . Hypertension Mother   . Heart attack Father   . Breast cancer Maternal Aunt   . Breast cancer Cousin        maternal 1st cousin   Social History:  reports that she has been smoking cigarettes. She has a 53.00 pack-year smoking history. She has never used smokeless tobacco. She reports that she does not drink alcohol and does not use drugs.  Allergies:  Allergies  Allergen Reactions  . Penicillins Other (See Comments)    chills   Medications: I have reviewed the patient's current medications.  Results for orders placed or performed during the hospital encounter of 05/28/20 (from the past 48 hour(s))  Basic metabolic panel     Status: Abnormal  Collection Time: 06/01/20  8:19 AM  Result Value Ref Range   Sodium 137 135 - 145 mmol/L   Potassium 3.0 (L) 3.5 - 5.1 mmol/L   Chloride 101 98 - 111 mmol/L   CO2 26 22 - 32 mmol/L   Glucose, Bld 109 (H) 70 - 99 mg/dL    Comment: Glucose reference range applies only to samples taken after fasting for at least 8 hours.   BUN 5 (L) 8 - 23 mg/dL   Creatinine, Ser 0.59 0.44 - 1.00 mg/dL   Calcium 9.2 8.9  - 10.3 mg/dL   GFR, Estimated >60 >60 mL/min    Comment: (NOTE) Calculated using the CKD-EPI Creatinine Equation (2021)    Anion gap 10 5 - 15    Comment: Performed at [facility] [address], [place], [place] [address]  Basic metabolic panel     Status: Abnormal   Collection Time: 06/02/20 10:17 AM  Result Value Ref Range   Sodium 138 135 - 145 mmol/L   Potassium 3.5 3.5 - 5.1 mmol/L   Chloride 105 98 - 111 mmol/L   CO2 24 22 - 32 mmol/L   Glucose, Bld 109 (H) 70 - 99 mg/dL    Comment: Glucose reference range applies only to samples taken after fasting for at least 8 hours.   BUN 9 8 - 23 mg/dL   Creatinine, Ser 0.59 0.44 - 1.00 mg/dL   Calcium 9.2 8.9 - 10.3 mg/dL   GFR, Estimated >60 >60 mL/min    Comment: (NOTE) Calculated using the CKD-EPI Creatinine Equation (2021)    Anion gap 9 5 - 15    Comment: Performed at [facility] [address], [place], [place] [address]  No results found.  Review of Systems  Constitutional: Positive for activity change. Negative for appetite change, chills, diaphoresis, fatigue, fever and unexpected weight change.  HENT: Negative.   Eyes: Negative.   Respiratory: Negative.   Cardiovascular: Negative.   Gastrointestinal: Positive for anal bleeding, blood in stool, constipation, diarrhea and rectal pain. Negative for abdominal distention, abdominal pain, nausea and vomiting.  Endocrine: Negative.   Genitourinary: Negative.   Musculoskeletal: Negative.   Allergic/Immunologic: Negative.   Neurological: Negative.   Hematological: Negative.   Psychiatric/Behavioral: Negative.    Blood pressure (!) 142/76, pulse 89, temperature 98.6 F (37 C), temperature source Oral, resp. rate 17, SpO2 100 %. Physical Exam Constitutional:      General: She is not in acute distress.    Appearance: Normal appearance. She is obese. She is not ill-appearing.  HENT:     Head: Normocephalic and atraumatic.     Mouth/Throat:     Mouth:  Mucous membranes are moist.  Eyes:     Extraocular Movements: Extraocular movements intact.     Pupils: Pupils are equal, round, and reactive to light.  Cardiovascular:     Rate and Rhythm: Normal rate and regular rhythm.     Pulses: Normal pulses.     Heart sounds: Normal heart sounds.  Pulmonary:     Effort: Pulmonary effort is normal.     Breath sounds: Normal breath sounds.  Abdominal:     General: Bowel sounds are normal. There is no distension.     Palpations: Abdomen is soft.     Tenderness: There is no abdominal tenderness. There is no guarding.     Comments: There is a large open cholecystectomy scar present on right upper quadrant  Genitourinary:    Comments:  Prolapsed internal hemorrhoid is noted on exam which seems somewhat inflamed and on digital rectal exam is large amount of inspissated stool in the rectal vault Musculoskeletal:     Cervical back: Normal range of motion and neck supple.  Skin:    General: Skin is warm and dry.  Neurological:     Mental Status: She is alert and oriented to person, place, and time.  Psychiatric:        Mood and Affect: Mood normal.        Behavior: Behavior normal.        Thought Content: Thought content normal.        Judgment: Judgment normal.   Assessment/Plan: 1) Rectal bleeding with an inflamed prolapsed internal hemorrhoid complicated by constipation-I have asked the patient's nurse to give her some 5% lidocaine to apply to the hemorrhoidal area and give her some more MiraLAX to help with the constipation.  The nurses question the use of MiraLAX as the patient has some loose stools which might be seepage of stools around ther fecal impaction in the rectum. 2) Sigmoid diverticulosis. 3) S/P bilateral carotid subclavian transposition and TAVR for type B aortic root dissection. 4) HTN. 5) History of sleep apnea. 6) History of stroke with left-sided weakness. Juanita Craver 06/02/2020, 4:00 PM

## 2020-06-02 NOTE — Progress Notes (Signed)
Patient stated her pain is 10/10 in her rectum. I offered anusol as well as Tucks. Patient declined these medications. Tylenol was given earlier with no relief. Sitz bath completed with no relief. Oxycodone given per patient request. MD stated patient will be seen by GI.  Daymon Larsen, RN

## 2020-06-02 NOTE — Progress Notes (Signed)
Patient has order for miralax and lidocaine due to stool impaction. Patient is not comfortable taking miralax again as she said "it is too harsh". Patient wouldl like another option. Notified MD, waiting on response.  Daymon Larsen, RN

## 2020-06-02 NOTE — Progress Notes (Signed)
[facility] [place]  Subjective  - painful hemorrhoids with some bleeding   Objective (!) 142/76 89 98.6 F (37 C) (Oral) 17 100%  Intake/Output Summary (Last 24 hours) at 06/02/2020 1901 Last data filed at 06/02/2020 0035 Gross per 24 hour  Intake 400 ml  Output 100 ml  Net 300 ml   Neck and groin incisions healing 2+ DP pulses  Assessment/Planning: Felt better for a little bit yesterday then had hemorrhoid flare Will try sitz bath today Continue tucks Will get GI to see since some bleeding today  [name] 06/02/2020 8:33 AM --  Laboratory Lab Results: Recent Labs    05/31/20 0917  WBC 5.9  HGB 10.4*  HCT 32.7*  PLT 257   BMET Recent Labs    05/31/20 0917 06/01/20 0819  NA 135 137  K 2.8* 3.0*  CL 101 101  CO2 25 26  GLUCOSE 128* 109*  BUN <5* 5*  CREATININE 0.61 0.59  CALCIUM 8.6* 9.2    COAG Lab Results  Component Value Date   INR 1.5 (H) 05/14/2020   INR 0.96 01/05/2017   No results found for: PTT

## 2020-06-03 NOTE — Progress Notes (Signed)
[facility] [place]  Subjective  - rectal pain   Objective (!) 144/81 86 97.8 F (36.6 C) (Oral) 20 99%  Intake/Output Summary (Last 24 hours) at 06/03/2020 0831 Last data filed at 06/03/2020 0100 Gross per 24 hour  Intake 300 ml  Output 300 ml  Net 0 ml   Incisions all clean and healing 2+ dP pulses  Assessment/Planning: D/w pt that GI service has prescribed Miralax and discussed that the pain will hopefully get better when the stool impaction is improved  She is now consenting to miralax and I discussed stool softener will not help. Also discussed possible manual disimpaction if miralax fails  POD 19 repair of aortic dissection stable  Angel Lane 06/03/2020 8:31 AM --  Laboratory Lab Results: Recent Labs    05/31/20 0917  WBC 5.9  HGB 10.4*  HCT 32.7*  PLT 257   BMET Recent Labs    06/01/20 0819 06/02/20 1017  NA 137 138  K 3.0* 3.5  CL 101 105  CO2 26 24  GLUCOSE 109* 109*  BUN 5* 9  CREATININE 0.59 0.59  CALCIUM 9.2 9.2    COAG Lab Results  Component Value Date   INR 1.5 (H) 05/14/2020   INR 0.96 01/05/2017   No results found for: PTT

## 2020-06-03 NOTE — Progress Notes (Signed)
Physical Therapy Treatment Patient Details Name: Angel Lane MRN: [id number] DOB: 1950-09-09 Today's Date: 06/03/2020    History of Present Illness 70 yo admitted 3/7 with type B aortic dissection s/p bil carotid subclavian transposition and TEVAR for dissection on 3/11.  Pt received B subclavian transposition bypass. Re-admit on 3/25 due to constipation along with some rectal bleeding with some rectal discomfort and pain. Found to have fecal impaction. PMhx: HTN, CVA, anemia    PT Comments    Pt supine on arrival, agreeable to therapy session and with good participation and tolerance for mobility within room. Pt performed stair/gait training with no assistive device, needing min guard via HHA to perform 4 steps and no buckling or LOB observed. Pt continues to report rectal discomfort and has not yet had BM, encouraged pt to ambulate more throughout day with nursing/mobility tech assist. Reviewed seated/standing HEP which she performed with good tolerance and encouraged pt to perform t/o day with Supervision for any standing tasks. Pt continues to benefit from PT services to progress toward functional mobility goals. Continue to recommend HHPT.  Follow Up Recommendations  Home health PT     Equipment Recommendations  None recommended by PT    Recommendations for Other Services       Precautions / Restrictions Precautions Precautions: Fall Restrictions Weight Bearing Restrictions: No    Mobility  Bed Mobility Overal bed mobility: Modified Independent             General bed mobility comments: requires extra time    Transfers Overall transfer level: Needs assistance Equipment used: None Transfers: Sit to/from Stand Sit to Stand: Modified independent (Device/Increase time)         General transfer comment: no physical assist required, from bed/toilet heights with use of wall rail and x5 reps from chair  Ambulation/Gait Ambulation/Gait assistance:  Supervision Gait Distance (Feet): 110 Feet (55ft, seated break, 110ft) Assistive device: None Gait Pattern/deviations: Step-through pattern;Decreased stride length Gait velocity: decreased Gait velocity interpretation: <1.31 ft/sec, indicative of household ambulator General Gait Details: HR 80's-90's bpm, no DOE, no LOB; occasional cues for line awareness due to IV line; pt deferring hallway ambulation so short laps completed in room   Stairs Stairs: Yes Stairs assistance: Min guard Stair Management: Step to pattern;Forwards;Backwards;No rails (HHA on RUE per home setup no rails) Number of Stairs: 4 General stair comments: cues for sequencing/safety, no LOB, no significant dyspnea   Wheelchair Mobility    Modified Rankin (Stroke Patients Only)       Balance Overall balance assessment: Needs assistance Sitting-balance support: Feet supported Sitting balance-Leahy Scale: Good     Standing balance support: Bilateral upper extremity supported;During functional activity Standing balance-Leahy Scale: Good Standing balance comment: good tolerance for gait no AD no LOB supervision only due to IV line          Cognition Arousal/Alertness: Awake/alert Behavior During Therapy: WFL for tasks assessed/performed Overall Cognitive Status: Within Functional Limits for tasks assessed             General Comments: A/O x4. Anxious about her hemorrhoids burning, but pleasant and cooperative.      Exercises General Exercises - Lower Extremity Ankle Circles/Pumps: AROM;Both;20 reps;Seated Long Arc Quad: AROM;Both;10 reps;Seated Hip Flexion/Marching: AROM;Both;10 reps;Seated Other Exercises Other Exercises: serial STS x 5 for BLE strengthening Other Exercises: Gait billed as TE for BLE strengthening    General Comments General comments (skin integrity, edema, etc.): VSS on RA, no c/o dizziness  Pertinent Vitals/Pain Pain Assessment: Faces Faces Pain Scale: Hurts a little  bit Pain Location: perineal area/rectum due to hemorrhoids Pain Descriptors / Indicators: Discomfort;Grimacing Pain Intervention(s): Monitored during session;Limited activity within patient's tolerance;Repositioned (tucks pads in room for comfort, pt to use after bathing post-session)    Home Living                      Prior Function            PT Goals (current goals can now be found in the care plan section) Acute Rehab PT Goals Patient Stated Goal: to go home PT Goal Formulation: With patient Time For Goal Achievement: 06/13/20 Potential to Achieve Goals: Good Progress towards PT goals: Progressing toward goals    Frequency    Min 3X/week      PT Plan Current plan remains appropriate    Co-evaluation              AM-PAC PT "6 Clicks" Mobility   Outcome Measure  Help needed turning from your back to your side while in a flat bed without using bedrails?: None Help needed moving from lying on your back to sitting on the side of a flat bed without using bedrails?: None Help needed moving to and from a bed to a chair (including a wheelchair)?: None Help needed standing up from a chair using your arms (e.g., wheelchair or bedside chair)?: None Help needed to walk in hospital room?: A Little Help needed climbing 3-5 steps with a railing? : A Little 6 Click Score: 22    End of Session Equipment Utilized During Treatment: Gait belt Activity Tolerance: Patient tolerated treatment well Patient left: in chair;with call bell/phone within reach (awaiting NT for bath) Nurse Communication: Mobility status PT Visit Diagnosis: Other abnormalities of gait and mobility (R26.89);Difficulty in walking, not elsewhere classified (R26.2)     Time: 1205-1224 PT Time Calculation (min) (ACUTE ONLY): 19 min  Charges:  $Therapeutic Exercise: 8-22 mins                     Kaynan Klonowski P., PTA Acute Rehabilitation Services Pager: [phone] Office: [place] 06/03/2020, 12:53 PM

## 2020-06-03 NOTE — Progress Notes (Signed)
Subjective: Still with proctalgia.  Objective: Vital signs in last 24 hours: Temp:  [97.7 F (36.5 C)-99.1 F (37.3 C)] 97.7 F (36.5 C) (03/31 1635) Pulse Rate:  [78-90] 83 (03/31 1635) Resp:  [18-20] 18 (03/31 1635) BP: (134-146)/(68-81) 141/72 (03/31 1635) SpO2:  [97 %-100 %] 99 % (03/31 1635) Last BM Date: 06/01/20  Intake/Output from previous day: 03/30 0701 - 03/31 0700 In: 300 [P.O.:200; IV Piggyback:100] Out: 300 [Urine:300] Intake/Output this shift: Total I/O In: 50 [IV Piggyback:50] Out: -   General appearance: alert and no distress GI: soft, non-tender; bowel sounds normal; no masses,  no organomegaly Rectal: Impacted stool.  Lab Results: No results for input(s): WBC, HGB, HCT, PLT in the last 72 hours. BMET Recent Labs    06/01/20 0819 06/02/20 1017  NA 137 138  K 3.0* 3.5  CL 101 105  CO2 26 24  GLUCOSE 109* 109*  BUN 5* 9  CREATININE 0.59 0.59  CALCIUM 9.2 9.2   LFT No results for input(s): PROT, ALBUMIN, AST, ALT, ALKPHOS, BILITOT, BILIDIR, IBILI in the last 72 hours. PT/INR No results for input(s): LABPROT, INR in the last 72 hours. Hepatitis Panel No results for input(s): HEPBSAG, HCVAB, HEPAIGM, HEPBIGM in the last 72 hours. C-Diff No results for input(s): CDIFFTOX in the last 72 hours. Fecal Lactopherrin No results for input(s): FECLLACTOFRN in the last 72 hours.  Studies/Results: No results found.  Medications:  Scheduled: . amLODipine  5 mg Oral Daily  . carvedilol  6.25 mg Oral BID WC  . heparin  5,000 Units Subcutaneous Q8H  . nicotine  21 mg Transdermal Daily  . pantoprazole  40 mg Oral Daily  . rosuvastatin  5 mg Oral Daily   Continuous:   Assessment/Plan: 1) Fecal impaction. 2) Constipation.   The patient was disimpacted.  Lidocaine ointment 5% was applied and she was able to be disimpacted.  It was difficult for her to tolerate, but she did notice an improvement.  Currently her hemorrhoids are burning with the  disimpaction.  Plan: 1) Continue with Miralax.  LOS: 6 days   Derrian Rodak D 06/03/2020, 6:00 PM

## 2020-06-03 NOTE — Plan of Care (Signed)

## 2020-06-03 NOTE — Progress Notes (Signed)
Pt refused to take miralax, dulcolax suppository, or prune juice. Said its too harsh. Pt does not want to walk bc her rectum hurts too bad. Educated pt pain medication will constipate her even more. Pt requested stool softener. Called on call vascular number that was office number. Will pass on . Pt resting in bed. Will continue to monitor.

## 2020-06-04 MED ORDER — NICOTINE 21 MG/24HR TD PT24: 21.0000 mg | MEDICATED_PATCH | Freq: Every day | TRANSDERMAL | 0 refills | Status: DC

## 2020-06-04 NOTE — Plan of Care (Signed)
  Problem: Education: Goal: Knowledge of General Education information will improve Description: Including pain rating scale, medication(s)/side effects and non-pharmacologic comfort measures Outcome: Adequate for Discharge   

## 2020-06-04 NOTE — Progress Notes (Signed)
Discharge instructions (including medications) discussed with and copy provided to patient/caregiver 

## 2020-06-04 NOTE — Progress Notes (Addendum)
  Progress Note    06/04/2020 10:26 AM * No surgery found *  Subjective:  Rectal pain resolved.  Wants to go home   Vitals:   06/04/20 0501 06/04/20 0756  BP: 126/66 116/79  Pulse: 70 88  Resp: 18 17  Temp: 98.4 F (36.9 C) 98.3 F (36.8 C)  SpO2: 98% 97%   Physical Exam: Lungs:  Non labored Extremities:  Palpable DP pulses Neurologic: A&O  CBC    Component Value Date/Time   WBC 5.9 05/31/2020 0917   RBC 3.59 (L) 05/31/2020 0917   HGB 10.4 (L) 05/31/2020 0917   HGB 13.4 02/19/2020 1604   HCT 32.7 (L) 05/31/2020 0917   HCT 40.8 02/19/2020 1604   PLT 257 05/31/2020 0917   PLT 200 02/19/2020 1604   MCV 91.1 05/31/2020 0917   MCV 91 02/19/2020 1604   MCV 90 10/29/2011 1553   MCH 29.0 05/31/2020 0917   MCHC 31.8 05/31/2020 0917   RDW 12.9 05/31/2020 0917   RDW 12.3 02/19/2020 1604   RDW 13.2 10/29/2011 1553   LYMPHSABS 2.2 05/10/2020 1001   MONOABS 0.9 05/10/2020 1001   EOSABS 0.1 05/10/2020 1001   BASOSABS 0.0 05/10/2020 1001    BMET    Component Value Date/Time   NA 138 06/02/2020 1017   NA 141 02/19/2020 1604   NA 141 10/29/2011 1553   K 3.5 06/02/2020 1017   K 3.3 (L) 10/29/2011 1553   CL 105 06/02/2020 1017   CL 108 (H) 10/29/2011 1553   CO2 24 06/02/2020 1017   CO2 26 10/29/2011 1553   GLUCOSE 109 (H) 06/02/2020 1017   GLUCOSE 104 (H) 10/29/2011 1553   BUN 9 06/02/2020 1017   BUN 17 02/19/2020 1604   BUN 18 10/29/2011 1553   CREATININE 0.59 06/02/2020 1017   CREATININE 0.87 10/29/2011 1553   CALCIUM 9.2 06/02/2020 1017   CALCIUM 9.1 10/29/2011 1553   GFRNONAA >60 06/02/2020 1017   GFRNONAA >60 10/29/2011 1553   GFRAA 78 02/19/2020 1604   GFRAA >60 10/29/2011 1553    INR    Component Value Date/Time   INR 1.5 (H) 05/14/2020 1930     Intake/Output Summary (Last 24 hours) at 06/04/2020 1026 Last data filed at 06/03/2020 1640 Gross per 24 hour  Intake 50 ml  Output --  Net 50 ml     Assessment/Plan:  70 y.o. female is s/p TEVAR  with bilateral carotid subclavian bypass re-admitted due to failure to thrive * No surgery found *   BLE well perfused Pain from hemorrhoids resolved Ready for discharge home   Dagoberto Ligas, [place] [facility] [phone] 06/04/2020 10:26 AM  Agree with above  D/c home  [name], MD [facility] [place]: [phone]

## 2020-06-04 NOTE — Care Management Important Message (Signed)
Important Message  Patient Details  Name: Angel Lane MRN: [id number] Date of Birth: 1950/12/21   Medicare Important Message Given:  Yes     [name] 06/04/2020, 10:30 AM

## 2020-06-04 NOTE — Progress Notes (Signed)
Subjective: Feeling better today.  No complaints of proctalgia and rectal fullness.  Objective: Vital signs in last 24 hours: Temp:  [97.7 F (36.5 C)-99.5 F (37.5 C)] 98.4 F (36.9 C) (04/01 0501) Pulse Rate:  [70-87] 70 (04/01 0501) Resp:  [17-20] 18 (04/01 0501) BP: (123-144)/(58-81) 126/66 (04/01 0501) SpO2:  [97 %-99 %] 98 % (04/01 0501) Last BM Date: 06/03/20  Intake/Output from previous day: 03/31 0701 - 04/01 0700 In: 50 [IV Piggyback:50] Out: -  Intake/Output this shift: No intake/output data recorded.  General appearance: alert and no distress GI: soft, non-tender; bowel sounds normal; no masses,  no organomegaly  Lab Results: No results for input(s): WBC, HGB, HCT, PLT in the last 72 hours. BMET Recent Labs    06/01/20 0819 06/02/20 1017  NA 137 138  K 3.0* 3.5  CL 101 105  CO2 26 24  GLUCOSE 109* 109*  BUN 5* 9  CREATININE 0.59 0.59  CALCIUM 9.2 9.2   LFT No results for input(s): PROT, ALBUMIN, AST, ALT, ALKPHOS, BILITOT, BILIDIR, IBILI in the last 72 hours. PT/INR No results for input(s): LABPROT, INR in the last 72 hours. Hepatitis Panel No results for input(s): HEPBSAG, HCVAB, HEPAIGM, HEPBIGM in the last 72 hours. C-Diff No results for input(s): CDIFFTOX in the last 72 hours. Fecal Lactopherrin No results for input(s): FECLLACTOFRN in the last 72 hours.  Studies/Results: No results found.  Medications:  Scheduled: . amLODipine  5 mg Oral Daily  . carvedilol  6.25 mg Oral BID WC  . heparin  5,000 Units Subcutaneous Q8H  . nicotine  21 mg Transdermal Daily  . pantoprazole  40 mg Oral Daily  . rosuvastatin  5 mg Oral Daily   Continuous:   Assessment/Plan: 1) Fecal impaction s/p disimpaction. 2) Constipation.   She is feeling well.  The disimpaction was traumatic, but she is grateful for the relief.  Plan: 1) Continue with Miralax QD. 2) Signing off. 3) Any GI follow up will be wit her primary GI, Dr. Alice Reichert at [facility].  LOS: 7 days   Kevion Fatheree D 06/04/2020, 7:28 AM

## 2020-06-04 NOTE — Progress Notes (Signed)
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  Pt received B subclavian transposition bypass. Re-admit on 3/25 due to constipation along with some rectal bleeding with some rectal discomfort and pain. Found to have fecal impaction. Underwent disimpaction 3/31. PMhx: HTN, CVA, anemia    PT Comments    Pt demonstrates independence with bed mobility, transfers, and ambulation 250' without AD. Steady gait noted. Plan is for d/c home today. All goals met. PT signing off.   Follow Up Recommendations  Home health PT     Equipment Recommendations  None recommended by PT    Recommendations for Other Services       Precautions / Restrictions Precautions Precautions: None    Mobility  Bed Mobility Overal bed mobility: Modified Independent                  Transfers Overall transfer level: Modified independent                  Ambulation/Gait Ambulation/Gait assistance: Modified independent (Device/Increase time) Gait Distance (Feet): 250 Feet Assistive device: None   Gait velocity: decreased Gait velocity interpretation: 1.31 - 2.62 ft/sec, indicative of limited community ambulator General Gait Details: steady gait without AD   Stairs             Wheelchair Mobility    Modified Rankin (Stroke Patients Only)       Balance Overall balance assessment: Mild deficits observed, not formally tested                                          Cognition Arousal/Alertness: Awake/alert Behavior During Therapy: WFL for tasks assessed/performed Overall Cognitive Status: Within Functional Limits for tasks assessed                                        Exercises      General Comments General  comments (skin integrity, edema, etc.): VSS on RA      Pertinent Vitals/Pain Pain Assessment: Faces Faces Pain Scale: Hurts a little bit Pain Location: rectal area due to hemorrhoids Pain Descriptors / Indicators: Discomfort Pain Intervention(s): Monitored during session    Home Living                      Prior Function            PT Goals (current goals can now be found in the care plan section) Acute Rehab PT Goals Patient Stated Goal: home today Progress towards PT goals: Goals met/education completed, patient discharged from PT    Frequency    Min 3X/week      PT Plan Current plan remains appropriate    Co-evaluation              AM-PAC PT "6 Clicks" Mobility   Outcome Measure  Help needed turning from your back to your side while in a flat bed without using bedrails?: None Help needed moving from lying on your back to sitting on the side of a flat bed without using bedrails?: None Help needed moving to and from  a bed to a chair (including a wheelchair)?: None Help needed standing up from a chair using your arms (e.g., wheelchair or bedside chair)?: None Help needed to walk in hospital room?: None Help needed climbing 3-5 steps with a railing? : A Little 6 Click Score: 23    End of Session   Activity Tolerance: Patient tolerated treatment well Patient left: in chair;with call bell/phone within reach Nurse Communication: Mobility status PT Visit Diagnosis: Other abnormalities of gait and mobility (R26.89);Difficulty in walking, not elsewhere classified (R26.2)     Time: 1947-1252 PT Time Calculation (min) (ACUTE ONLY): 12 min  Charges:  $Gait Training: 8-22 mins                     Angel Lane, PT  Office # [phone] Pager [phone]    Angel Lane 06/04/2020, 9:34 AM

## 2020-06-07 ENCOUNTER — Telehealth: Payer: Self-pay

## 2020-06-07 NOTE — Telephone Encounter (Signed)
Copied from [place] [id number]. Topic: Appointment Scheduling - Scheduling Inquiry for Clinic >> Jun 07, 2020  3:36 PM Erick Blinks wrote: Reason for CRM: Pt wants to be seen as soon as possible, she had heart surgery. (8 hours) pt needs blood work for potassium and needs to be seen by her PCP.   Best contact: [phone]

## 2020-06-07 NOTE — Telephone Encounter (Signed)
The 10:20 on 4/7 is a 20 min IUD insertion that is supposed to be rescheduled. Could use that slot for this patient to have hospital f/u appt.

## 2020-06-10 ENCOUNTER — Ambulatory Visit: Payer: Federal, State, Local not specified - PPO | Admitting: Family Medicine

## 2020-06-10 ENCOUNTER — Telehealth: Payer: Self-pay

## 2020-06-10 ENCOUNTER — Ambulatory Visit: Payer: Federal, State, Local not specified - PPO | Admitting: Vascular Surgery

## 2020-06-10 NOTE — Telephone Encounter (Signed)
Diane from [facility] called about patient's blood pressure s/p bil carotid subclavian transposition and TEVAR. Discussed with CEF - parameters are above 100 and below 140. Advised Diane that Skeet Latch from [facility] was managing her BP meds. She verbalized understanding.

## 2020-06-11 ENCOUNTER — Telehealth: Payer: Self-pay | Admitting: Family Medicine

## 2020-06-11 NOTE — Telephone Encounter (Signed)
Dianne RN calling from [facility] is calling regarding the Pt BP medications.  Angel Lane is in need of parameters the Pt Bp is dropping to in 96/60 on no medications. Pt reports having no amLODipine (NORVASC) 5 MG tablet [[id number]]  ENDED in the home. Cb- [phone]

## 2020-06-11 NOTE — Telephone Encounter (Signed)
Please review. Should patient continue amlodipine? Patient was also scheduled for a HFU on 4/11 (Monday) w/ Dr. B but it had to be canceled. When can the patient f/u? Please advise. Thanks!

## 2020-06-11 NOTE — Telephone Encounter (Signed)
When was her last dose of hctz and when was her last dose of amlodipine?

## 2020-06-14 ENCOUNTER — Telehealth: Payer: Self-pay

## 2020-06-14 ENCOUNTER — Ambulatory Visit: Payer: Federal, State, Local not specified - PPO | Admitting: Family Medicine

## 2020-06-14 MED ORDER — AMLODIPINE BESYLATE 2.5 MG PO TABS: 2.5000 mg | ORAL_TABLET | Freq: Every day | ORAL | 0 refills | Status: DC

## 2020-06-14 NOTE — Telephone Encounter (Signed)
Patient advised as below. Patient verbalizes understanding and is in agreement with treatment plan.  

## 2020-06-14 NOTE — Telephone Encounter (Signed)
I would recommend taking hctz 1 x 12.5mg  once a day, and reduce amlodipine to 2.5mg  once a day which should keep her blood pressure more level. Can send in prescription for 30 of each with 1 refill.

## 2020-06-14 NOTE — Telephone Encounter (Signed)
Patient called to report that her BP is 98/66, she reports headache and dizziness. She reports she did take hctz 12.5 mg this morning at 8:51am. Patient has not taken Amlodipine today. Patient reports eating and drinking well, ate lunch at 2pm. Patient denies any blurred vision, nausea, or vomiting. Please advise.  Thank you,

## 2020-06-14 NOTE — Telephone Encounter (Signed)
Patient reports taking hctz and amlodipine as needed. Patient reports taking hctz 12.5mg  only on 06/11/2020 in the morning after the low BP reading. Patient reports her BP went up to 109/70. Patient reports she did take amlodipine later Friday evening.

## 2020-06-14 NOTE — Telephone Encounter (Signed)
Would put amlodipine on hold, only take if BP gets above 130/80

## 2020-06-15 NOTE — Telephone Encounter (Signed)
Patient advised as below. Patient verbalizes understanding and is in agreement with treatment plan.  

## 2020-06-15 NOTE — Progress Notes (Signed)
Established patient visit   Patient: Angel Lane   DOB: [birth year]   70 y.o. Female  MRN: [id number] Visit Date: 06/16/2020  Today's healthcare provider: Wilhemena Durie, MD   Chief Complaint  Patient presents with  . Hospitalization Follow-up   Subjective    HPI  Follow up Hospitalization Hospital records were reviewed and she was admitted for abdominal discomfort in the setting of recent major surgery.  She is feeling much better her issues today are chronic right leg numbness since her surgery and right arm discomfort mainly aggravated with movement in her neck.  She is able to lift her right shoulder and arm.  He is off of all blood pressure medicines. Patient was admitted on 05/28/2020 and discharged on 06/09/2020. She was treated for History of repair of dissecting thoracic aneurysm . Treatment for this included HCTZ and Carvedilol. Telephone follow up was done on 06/07/2020 She reports excellent compliance with treatment. She reports this condition is improved. Patient still has chest tightness.    Patient is also having numbness and pain in left arm from shoulder down to fingers. Patient is also having numbness in right leg.   ----------------------------------------------------------------------------------------- -        Medications: Outpatient Medications Prior to Visit  Medication Sig  . acetaminophen (TYLENOL) 500 MG tablet Take 500 mg by mouth every 6 (six) hours as needed for moderate pain.  . carvedilol (COREG) 6.25 MG tablet Take 1 tablet (6.25 mg total) by mouth 2 (two) times daily with a meal.  . nicotine (NICODERM CQ - DOSED IN MG/24 HOURS) 21 mg/24hr patch Place 1 patch (21 mg total) onto the skin daily.  . rosuvastatin (CRESTOR) 5 MG tablet Take 1 tablet (5 mg total) by mouth daily.  Marland Kitchen amLODipine (NORVASC) 2.5 MG tablet Take 1 tablet (2.5 mg total) by mouth daily. (Patient not taking: Reported on 06/16/2020)  . hydrochlorothiazide (HYDRODIURIL)  12.5 MG tablet Take 2 tablets (25 mg total) by mouth daily.  Marland Kitchen oxyCODONE-acetaminophen (PERCOCET/ROXICET) 5-325 MG tablet Take 1 tablet by mouth every 6 (six) hours as needed for moderate pain. (Patient not taking: Reported on 06/16/2020)   No facility-administered medications prior to visit.    Review of Systems  Constitutional: Negative for appetite change, chills, fatigue and fever.  Respiratory: Negative for chest tightness and shortness of breath.   Cardiovascular: Negative for chest pain, palpitations and leg swelling.       Chest tightness   Gastrointestinal: Negative for abdominal pain, nausea and vomiting.  Neurological: Negative for dizziness and weakness.        Objective    BP 134/81 (BP Location: Right Arm, Patient Position: Sitting, Cuff Size: Normal)   Pulse 92   Ht 5\' 5"  (1.651 m)   Wt 173 lb (78.5 kg)   SpO2 100%   BMI 28.79 kg/m  BP Readings from Last 3 Encounters:  06/19/20 (!) 162/79  06/17/20 130/76  06/16/20 134/81   Wt Readings from Last 3 Encounters:  06/17/20 173 lb (78.5 kg)  06/16/20 173 lb (78.5 kg)  05/28/20 193 lb (87.5 kg)       Physical Exam Vitals reviewed.  Constitutional:      Appearance: Normal appearance.  HENT:     Head: Normocephalic and atraumatic.     Right Ear: External ear normal.     Left Ear: External ear normal.     Mouth/Throat:     Pharynx: Oropharynx is clear.  Eyes:  General: No scleral icterus.    Conjunctiva/sclera: Conjunctivae normal.  Cardiovascular:     Rate and Rhythm: Normal rate and regular rhythm.     Pulses: Normal pulses.     Heart sounds: Normal heart sounds.  Pulmonary:     Effort: Pulmonary effort is normal.     Breath sounds: Normal breath sounds.  Abdominal:     Palpations: Abdomen is soft.  Skin:    General: Skin is warm and dry.     Comments: Scar under both clavicles from her vascular surgery.  Healing nicely  Neurological:     General: No focal deficit present.     Mental Status:  She is alert and oriented to person, place, and time.     Comments: Grossly nonfocal exam.  Psychiatric:        Mood and Affect: Mood normal.        Behavior: Behavior normal.        Thought Content: Thought content normal.        Judgment: Judgment normal.     ECG reveals normal sinus rhythm at a rate of 86 with no ischemic changes.  No results found for any visits on 06/16/20.  Assessment & Plan     1. Left arm numbness I do not think this is vascular.  Patient is recently quit smoking so we will go ahead and refer to cardiology to clear her from a cardiac standpoint - EKG 12-Lead - DG Cervical Spine Complete - Ambulatory referral to Cardiology  2. right arm pain May have a cervical radiculopathy.  Use prednisone daily for 5 days. - Ambulatory referral to Cardiology  3. Cervical radiculopathy   4. Tobacco abuse disorder She has completely quit as of the time of her surgery  5. OSA on CPAP   6. ASCVD (arteriosclerotic cardiovascular disease) All risk factors treated  7. Coronary artery disease of native artery of native heart with stable angina pectoris ([place]) Found on imaging. 8.  Status post endoluminal stent graft repair of type B thoracic aortic dissection   No follow-ups on file.      I, Wilhemena Durie, MD, have reviewed all documentation for this visit. The documentation on 06/23/20 for the exam, diagnosis, procedures, and orders are all accurate and complete.    Arnetta Odeh Cranford Mon, MD  [facility] [phone] (phone) [phone] (fax)  [address] [place]

## 2020-06-16 ENCOUNTER — Other Ambulatory Visit: Payer: Self-pay

## 2020-06-16 ENCOUNTER — Ambulatory Visit
Admission: RE | Admit: 2020-06-16 | Discharge: 2020-06-16 | Disposition: A | Discharge status: Home / Self Care | Payer: Federal, State, Local not specified - PPO | Source: Ambulatory Visit | Attending: Family Medicine | Admitting: Family Medicine

## 2020-06-16 ENCOUNTER — Ambulatory Visit (INDEPENDENT_AMBULATORY_CARE_PROVIDER_SITE_OTHER): Payer: Federal, State, Local not specified - PPO | Admitting: Family Medicine

## 2020-06-16 ENCOUNTER — Ambulatory Visit
Admission: RE | Admit: 2020-06-16 | Discharge: 2020-06-16 | Disposition: A | Discharge status: Home / Self Care | Payer: Federal, State, Local not specified - PPO | Attending: Family Medicine | Admitting: Family Medicine

## 2020-06-16 VITALS — BP 134/81 | HR 92 | Ht 65.0 in | Wt 173.0 lb

## 2020-06-16 DIAGNOSIS — R200 Anesthesia of skin: Secondary | ICD-10-CM

## 2020-06-16 DIAGNOSIS — Z720 Tobacco use: Secondary | ICD-10-CM | POA: Diagnosis not present

## 2020-06-16 DIAGNOSIS — M5412 Radiculopathy, cervical region: Secondary | ICD-10-CM

## 2020-06-16 DIAGNOSIS — G4733 Obstructive sleep apnea (adult) (pediatric): Secondary | ICD-10-CM

## 2020-06-16 DIAGNOSIS — M79602 Pain in left arm: Secondary | ICD-10-CM | POA: Diagnosis not present

## 2020-06-16 DIAGNOSIS — Z9989 Dependence on other enabling machines and devices: Secondary | ICD-10-CM

## 2020-06-16 DIAGNOSIS — I2510 Atherosclerotic heart disease of native coronary artery without angina pectoris: Secondary | ICD-10-CM

## 2020-06-16 DIAGNOSIS — I25118 Atherosclerotic heart disease of native coronary artery with other forms of angina pectoris: Secondary | ICD-10-CM

## 2020-06-16 MED ORDER — TRAMADOL HCL 50 MG PO TABS: 50.0000 mg | ORAL_TABLET | Freq: Four times a day (QID) | ORAL | 1 refills | Status: AC | PRN

## 2020-06-16 MED ORDER — PREDNISONE 20 MG PO TABS: 20.0000 mg | ORAL_TABLET | Freq: Every day | ORAL | 1 refills | Status: DC

## 2020-06-17 ENCOUNTER — Other Ambulatory Visit: Payer: Self-pay

## 2020-06-17 ENCOUNTER — Encounter: Payer: Self-pay | Admitting: Vascular Surgery

## 2020-06-17 ENCOUNTER — Ambulatory Visit: Payer: Federal, State, Local not specified - PPO | Admitting: Vascular Surgery

## 2020-06-17 VITALS — BP 130/76 | HR 82 | Temp 97.9°F | Resp 20 | Ht 65.0 in | Wt 173.0 lb

## 2020-06-17 DIAGNOSIS — I71019 Dissection of thoracic aorta, unspecified: Secondary | ICD-10-CM

## 2020-06-17 DIAGNOSIS — I7101 Dissection of thoracic aorta: Secondary | ICD-10-CM

## 2020-06-17 NOTE — Progress Notes (Addendum)
Patient is a 70 year old female who returns for postoperative follow-up today.  She underwent placement of a Gore C tag graft for type B dissection as well as bilateral carotid subclavian transposition on May 14, 2020.  She returns today for further follow-up.  She was readmitted postop with obstipation and required manual fecal disimpaction.  She complains today of some left arm pains which extend from her left shoulder all the way down into the first 3 digits of her left hand.  She does not really describe pins-and-needles numbness or tingling.  It is mainly just pain.  She has nearly normal motor function in the left upper extremity but states she does have a little bit of weakness from a prior stroke.  She states the pain has been present since her operation.  It has not really improved.  She was placed on tramadol yesterday by Dr. Rosanna Randy.  I reviewed the C-spine x-ray that he order this showed some mild arthritis.  She had a follow-up CT scan performed on May 28, 2020.  This showed patent transpositions with the stent in good position with complete thrombosis of the false lumen.  There was mild narrowing of the celiac artery, no SMA stenosis no renal artery stenosis no aneurysmal degeneration.  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  Current Outpatient Medications on File Prior to Visit  Medication Sig Dispense Refill  . acetaminophen (TYLENOL) 500 MG tablet Take 500 mg by mouth every 6 (six) hours as needed for moderate pain.    . carvedilol (COREG) 6.25 MG tablet Take 1 tablet (6.25 mg total) by mouth 2 (two) times daily with a meal. 60 tablet 1  . nicotine (NICODERM CQ - DOSED IN MG/24 HOURS) 21 mg/24hr patch Place 1 patch (21 mg total) onto the skin daily. 28 patch 0  . predniSONE (DELTASONE) 20 MG tablet Take 1 tablet (20 mg total) by mouth daily with breakfast. 5 tablet 1  . rosuvastatin (CRESTOR) 5 MG tablet Take 1 tablet (5 mg total) by mouth daily.  30 tablet 5  . traMADol (ULTRAM) 50 MG tablet Take 1 tablet (50 mg total) by mouth every 6 (six) hours as needed for up to 5 days. 45 tablet 1  . amLODipine (NORVASC) 2.5 MG tablet Take 1 tablet (2.5 mg total) by mouth daily. (Patient not taking: No sig reported) 90 tablet 0  . hydrochlorothiazide (HYDRODIURIL) 12.5 MG tablet Take 2 tablets (25 mg total) by mouth daily. 60 tablet 0  . oxyCODONE-acetaminophen (PERCOCET/ROXICET) 5-325 MG tablet Take 1 tablet by mouth every 6 (six) hours as needed for moderate pain. (Patient not taking: No sig reported) 30 tablet 0   No current facility-administered medications on file prior to visit.     Physical exam:  Vitals:   06/17/20 1322  BP: 130/76  Pulse: 82  Resp: 20  Temp: 97.9 F (36.6 C)  SpO2: 98%  Weight: 173 lb (78.5 kg)  Height: 5\' 5"  (1.651 m)    Extremities: 2+ dorsalis pedis pulses, 2+ femoral pulses no hematoma  Abdomen: Soft nontender no pulsatile mass  Neck: Well-healed supraclavicular incisions no fluctuance no drainage no erythema  Neuro: Fairly symmetric upper extremity motor strength subtle weakness left hand  Assessment: Doing well status post repair of type B aortic dissection.  Blood pressure is fairly well controlled at this point.  Goal should be to keep her systolic pressure less than 140.  Left arm pain probably secondary to  left supraclavicular incision and hopefully this will continue to get better with time it probably represents some mild neuropraxia.  Plan: The patient will follow up in 6 months time with repeat CT angiogram chest abdomen and pelvis.  [name], MD [facility] Office: [phone]

## 2020-06-19 ENCOUNTER — Emergency Department (HOSPITAL_COMMUNITY)
Admission: EM | Admit: 2020-06-19 | Discharge: 2020-06-19 | Disposition: A | Discharge status: Home / Self Care | Payer: Federal, State, Local not specified - PPO | Attending: Emergency Medicine | Admitting: Emergency Medicine

## 2020-06-19 DIAGNOSIS — K5903 Drug induced constipation: Secondary | ICD-10-CM | POA: Diagnosis not present

## 2020-06-19 DIAGNOSIS — K6289 Other specified diseases of anus and rectum: Secondary | ICD-10-CM

## 2020-06-19 DIAGNOSIS — Z87891 Personal history of nicotine dependence: Secondary | ICD-10-CM | POA: Insufficient documentation

## 2020-06-19 DIAGNOSIS — Z79899 Other long term (current) drug therapy: Secondary | ICD-10-CM | POA: Insufficient documentation

## 2020-06-19 DIAGNOSIS — I10 Essential (primary) hypertension: Secondary | ICD-10-CM | POA: Insufficient documentation

## 2020-06-19 LAB — COMPREHENSIVE METABOLIC PANEL
ALT: 39 U/L (ref 0–44)
AST: 34 U/L (ref 15–41)
Albumin: 3.3 g/dL — ABNORMAL LOW (ref 3.5–5.0)
Alkaline Phosphatase: 63 U/L (ref 38–126)
Anion gap: 11 (ref 5–15)
BUN: 14 mg/dL (ref 8–23)
CO2: 25 mmol/L (ref 22–32)
Calcium: 9.5 mg/dL (ref 8.9–10.3)
Chloride: 100 mmol/L (ref 98–111)
Creatinine, Ser: 0.57 mg/dL (ref 0.44–1.00)
GFR, Estimated: >60 mL/min (ref >60)
Glucose, Bld: 128 mg/dL — ABNORMAL HIGH (ref 70–99)
Potassium: 3.2 mmol/L — ABNORMAL LOW (ref 3.5–5.1)
Sodium: 136 mmol/L (ref 135–145)
Total Bilirubin: 0.5 mg/dL (ref 0.3–1.2)
Total Protein: 6.9 g/dL (ref 6.5–8.1)

## 2020-06-19 LAB — CBC WITH DIFFERENTIAL/PLATELET
Abs Immature Granulocytes: 0.03 10*3/uL (ref 0.00–0.07)
Basophils Absolute: 0 10*3/uL (ref 0.0–0.1)
Basophils Relative: 0 %
Eosinophils Absolute: 0 10*3/uL (ref 0.0–0.5)
Eosinophils Relative: 0 %
HCT: 39.5 % (ref 36.0–46.0)
Hemoglobin: 12.5 g/dL (ref 12.0–15.0)
Immature Granulocytes: 1 %
Lymphocytes Relative: 33 %
Lymphs Abs: 2.1 10*3/uL (ref 0.7–4.0)
MCH: 29.4 pg (ref 26.0–34.0)
MCHC: 31.6 g/dL (ref 30.0–36.0)
MCV: 92.9 fL (ref 80.0–100.0)
Monocytes Absolute: 0.5 10*3/uL (ref 0.1–1.0)
Monocytes Relative: 9 %
Neutro Abs: 3.6 10*3/uL (ref 1.7–7.7)
Neutrophils Relative %: 57 %
Platelets: 197 10*3/uL (ref 150–400)
RBC: 4.25 MIL/uL (ref 3.87–5.11)
RDW: 14.4 % (ref 11.5–15.5)
WBC: 6.3 10*3/uL (ref 4.0–10.5)
nRBC: 0 % (ref 0.0–0.2)

## 2020-06-19 LAB — LIPASE, BLOOD: Lipase: 28 U/L (ref 11–51)

## 2020-06-19 MED ORDER — POLYETHYLENE GLYCOL 3350 17 G PO PACK: 17.0000 g | PACK | Freq: Every day | ORAL | 0 refills | Status: DC | PRN

## 2020-06-19 NOTE — Discharge Instructions (Addendum)
As we discussed today I would recommend that you take MiraLAX as needed for constipation.  Given that you have been constipated I would recommend taking 1 dose every day for at least the next week. In general a good strategy if you feel constipated is to start by taking 1 dose of MiraLAX on day 1, 2 doses on day 2, 3 doses on day 3 and so forth until you have a bowel movement. It can take 1 to 3 days for you to have a noticeable change in your bowel movements with MiraLAX and it is important that you are drinking extra water as if you are not drinking enough water MiraLAX will not work.  I have given you prescription for MiraLAX however this is also available over-the-counter and may be less expensive for you that way.  The prescription states 1 dose daily as needed, however as we discussed you can take 1 dose on day 1, 2 doses on day 2, 3 doses on day 3 and escalate from there. If you develop any new or worsening symptoms, feel like MiraLAX is not improving your symptoms or have other concerns please seek additional medical care and evaluation. Additionally as we discussed narcotic pain medicine including tramadol will slow down your intestines and cause constipation.

## 2020-06-19 NOTE — ED Triage Notes (Signed)
Emergency Medicine Provider Triage Evaluation Note  Angel Lane , a 70 y.o. female  was evaluated in triage.  Pt complains of abdominal pain and constipation for 2 days. No vomiting, not passing gas. Recent ?sbo  Review of Systems  Positive: Abdominal pain Negative: vomiting  Physical Exam  BP (!) 182/91 (BP Location: Right Arm)   Pulse 91   Temp 98 F (36.7 C) (Oral)   Resp (!) 28   SpO2 100%  Gen:   Awake, uncomfortable HEENT:  Atraumatic  Resp:  Normal effort  Cardiac:  Normal rate  Abd:   Nondistended, nontender  MSK:   Moves extremities without difficulty  Neuro:  Speech clear   Medical Decision Making  Medically screening exam initiated at 6:41 PM.  Appropriate orders placed.  Angel Lane was informed that the remainder of the evaluation will be completed by another provider, this initial triage assessment does not replace that evaluation, and the importance of remaining in the ED until their evaluation is complete.  Clinical Impression  Will obtain abdominal labs, nurse working on rooming soon.   Delia Heady, PA-C 06/19/20 1842

## 2020-06-19 NOTE — ED Notes (Signed)
This RN assisted pt into stretcher, placed pt on monitor, and called husband Tinlee Navarrette) to inform him that pt is in room and he can sign in as her visitor (per pt request).

## 2020-06-19 NOTE — ED Notes (Signed)
Pt reports she had a bowel movement and pain has decreased.

## 2020-06-19 NOTE — ED Triage Notes (Signed)
Pt to triage via [facility] from home..  C/o rectal pain.  Reports constipation x 2 days.  Discharged from hospital 5 days ago for bowel obstruction.  Not passing gas and denies nausea and vomiting.

## 2020-06-19 NOTE — ED Provider Notes (Signed)
[facility] EMERGENCY DEPARTMENT Provider Note   CSN: [id number] Arrival date & time: 06/19/20  1819     History Chief Complaint  Patient presents with  . Constipation  . Rectal Pain    Angel Lane is a 70 y.o. 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  She states that she over the past 2 days has had worsening rectal pain and constipation.  She notes that she has hemorrhoids and has been using medicated wipes with improvement.  She was admitted after her aortic aneurysm repair for constipation and required disimpaction.  She recently started on tramadol for pain in her left arm which has been attributed possibly to the surgery.  She states "I am done with pain meds."  She denies significant abdominal pain.  She tried 6 pills of Colace, a suppository, and a enema today prior to arrival. She reports to me that while in the waiting room she had a bowel movement and then felt significantly better stating "it all came out."  She states that she now feels better and wishes to go home.  HPI     Past Medical History:  Diagnosis Date  . Anemia   . Depression   . Dyspnea    due to stroke  . Hypertension   . Neuromuscular disorder ([place])    left sided weakness from stroke  . Sleep apnea   . Stroke [facility])    November 2018     Patient Active Problem List   Diagnosis Date Noted  . Thoracic aortic dissection ([place]) 05/28/2020  . History of repair of dissecting thoracic aneurysm 05/28/2020  . Hypertensive emergency   . Coronary artery calcification   . Aortic dissection distal to left subclavian ([place]) 05/10/2020  . HCAP (healthcare-associated pneumonia) 05/08/2020  . Acute respiratory failure with hypoxia ([place]) 05/08/2020  . 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 History of CVA (cerebrovascular accident)  02/19/2020  . OSA on CPAP 02/19/2020  . DOE (dyspnea on exertion) 02/19/2020  . Encounter for annual physical exam 02/19/2020  . Breast cancer screening by mammogram 02/19/2020  . Palpitations 02/19/2020  . Left hip pain 02/19/2020  . Avitaminosis D 07/02/2019  . Trochanteric bursitis of both hips 07/02/2019  . Impacted cerumen of right ear 07/02/2019  . Pure hypercholesterolemia 10/08/2017  . Hyperglycemia 10/08/2017  . Insomnia 03/23/2017  . Tobacco abuse disorder 01/15/2017  . Left hemiparesis ([place]) 01/06/2017  . HTN (hypertension) 01/06/2017    Past Surgical History:  Procedure Laterality Date  . ABDOMINAL SURGERY    . CARDIAC CATHETERIZATION    . CAROTID-SUBCLAVIAN BYPASS GRAFT Bilateral 05/14/2020   Procedure: BILATERAL CAROTID-SUBCLAVIAN  TRANSPOSITION;  Surgeon: Elam Dutch, MD;  [facility] OR;  Service: Vascular;  Laterality: Bilateral;  . CATARACT EXTRACTION W/PHACO Left 05/16/2017   Procedure: CATARACT EXTRACTION PHACO AND INTRAOCULAR LENS PLACEMENT ([place]) LEFT;  Surgeon: Leandrew Koyanagi, MD;  Location: [place];  Service: Ophthalmology;  Laterality: Left;  . CATARACT EXTRACTION W/PHACO Right 06/13/2017   Procedure: CATARACT EXTRACTION PHACO AND INTRAOCULAR LENS PLACEMENT ([place]) RIGHT;  Surgeon: Leandrew Koyanagi, MD;  Location: [place];  Service: Ophthalmology;  Laterality: Right;  sleep apnea  . CHOLECYSTECTOMY    . COLONOSCOPY N/A 01/21/2020   Procedure: COLONOSCOPY;  Surgeon: Toledo, Benay Pike, MD;  Location: [facility] ENDOSCOPY;  Service: Gastroenterology;  Laterality: N/A;  . THORACIC  AORTIC ENDOVASCULAR STENT GRAFT N/A 05/14/2020   Procedure: THORACIC AORTIC ENDOVASCULAR STENT USING GORE TAG 68mm X 15cm GRAFT AND GORE TAG 47mm X 20cm GRAFT;  Surgeon: Elam Dutch, MD;  Location: [place];  Service: Vascular;  Laterality: N/A;  . ULTRASOUND GUIDANCE FOR VASCULAR ACCESS Bilateral 05/14/2020   Procedure: ULTRASOUND GUIDANCE FOR VASCULAR  ACCESS;  Surgeon: Elam Dutch, MD;  Location: [place];  Service: Vascular;  Laterality: Bilateral;     OB History   No obstetric history on file.     Family History  Problem Relation Age of Onset  . Asthma Mother   . Heart failure Mother   . Hypertension Mother   . Heart attack Father   . Breast cancer Maternal Aunt   . Breast cancer Cousin        maternal 1st cousin    Social History   Tobacco Use  . Smoking status: Former Smoker    Packs/day: 1.00    Years: 53.00    Pack years: 53.00    Types: Cigarettes    Quit date: 06/04/2020    Years since quitting: 0.0  . Smokeless tobacco: Never Used  . Tobacco comment: Less than 1/2 PPD  Vaping Use  . Vaping Use: Former  Substance Use Topics  . Alcohol use: No  . Drug use: No    Home Medications Prior to Admission medications   Medication Sig Start Date End Date Taking? Authorizing Provider  polyethylene glycol (MIRALAX / GLYCOLAX) 17 g packet Take 17 g by mouth daily as needed for mild constipation, moderate constipation or severe constipation. 06/19/20  Yes Lorin Glass, PA-C  acetaminophen (TYLENOL) 500 MG tablet Take 500 mg by mouth every 6 (six) hours as needed for moderate pain.    [provider]  amLODipine (NORVASC) 2.5 MG tablet Take 1 tablet (2.5 mg total) by mouth daily. Patient not taking: No sig reported 06/14/20   Birdie Sons, MD  carvedilol (COREG) 6.25 MG tablet Take 1 tablet (6.25 mg total) by mouth 2 (two) times daily with a meal. 05/21/20   Theda Sers, Susette Racer, PA-C  hydrochlorothiazide (HYDRODIURIL) 12.5 MG tablet Take 2 tablets (25 mg total) by mouth daily. 05/08/20 06/07/20  Darliss Cheney, MD  nicotine (NICODERM CQ - DOSED IN MG/24 HOURS) 21 mg/24hr patch Place 1 patch (21 mg total) onto the skin daily. 06/04/20   Dagoberto Ligas, PA-C  oxyCODONE-acetaminophen (PERCOCET/ROXICET) 5-325 MG tablet Take 1 tablet by mouth every 6 (six) hours as needed for moderate pain. Patient not taking: No sig  reported 05/21/20   Ulyses Amor, PA-C  predniSONE (DELTASONE) 20 MG tablet Take 1 tablet (20 mg total) by mouth daily with breakfast. 06/16/20   Jerrol Banana., MD  rosuvastatin (CRESTOR) 5 MG tablet Take 1 tablet (5 mg total) by mouth daily. 02/19/20   Bacigalupo, [name], MD  traMADol (ULTRAM) 50 MG tablet Take 1 tablet (50 mg total) by mouth every 6 (six) hours as needed for up to 5 days. 06/16/20 06/21/20  Jerrol Banana., MD    Allergies    Penicillins  Review of Systems   Review of Systems  Constitutional: Negative for chills and fever.  Respiratory: Negative for shortness of breath.   Cardiovascular: Negative for chest pain.  Gastrointestinal: Positive for constipation (Now has resolved) and rectal pain. Negative for abdominal pain, nausea and vomiting.  Genitourinary: Negative for dysuria, frequency and urgency.  Neurological: Negative for headaches.  All other  systems reviewed and are negative.   Physical Exam Updated Vital Signs BP (!) 162/79   Pulse 83   Temp 98 F (36.7 C) (Oral)   Resp 18   SpO2 98%   Physical Exam Vitals and nursing note reviewed.  Constitutional:      General: She is not in acute distress.    Appearance: She is not diaphoretic.  HENT:     Head: Normocephalic and atraumatic.  Eyes:     General: No scleral icterus.       Right eye: No discharge.        Left eye: No discharge.     Conjunctiva/sclera: Conjunctivae normal.  Cardiovascular:     Rate and Rhythm: Normal rate and regular rhythm.  Pulmonary:     Effort: Pulmonary effort is normal. No respiratory distress.     Breath sounds: No stridor.  Abdominal:     General: There is no distension.     Tenderness: There is no abdominal tenderness. There is no guarding.  Genitourinary:    Comments: Deferred Musculoskeletal:        General: No deformity.     Cervical back: Normal range of motion.  Skin:    General: Skin is warm and dry.  Neurological:     Mental Status:  She is alert.     Motor: No abnormal muscle tone.     Comments: Patient is awake and alert, answers questions appropriately without difficulty.  Speech is not slurred.  Able to ambulate without significant difficulty.  Psychiatric:        Behavior: Behavior normal.     ED Results / Procedures / Treatments   Labs (all labs ordered are listed, but only abnormal results are displayed) Labs Reviewed  COMPREHENSIVE METABOLIC PANEL - Abnormal; Notable for the following components:      Result Value   Potassium 3.2 (*)    Glucose, Bld 128 (*)    Albumin 3.3 (*)    All other components within normal limits  CBC WITH DIFFERENTIAL/PLATELET  LIPASE, BLOOD    EKG None  Radiology No results found.  Procedures Procedures   Medications Ordered in ED Medications - No data to display  ED Course  I have reviewed the triage vital signs and the nursing notes.  Pertinent labs & imaging results that were available during my care of the patient were reviewed by me and considered in my medical decision making (see chart for details).    MDM Rules/Calculators/A&P                         Patient is a 70 year old woman approximately 1 month postop aortic stent who presents today for evaluation of constipation after she recently started tramadol.  Prior to arrival she took about 6 pills of stool softener, used a suppository and attempted to use an enema.  While waiting for room she had a large bowel movement and states that she feels significantly better at this time.  She did have labs obtained, does not have a significant leukocytosis.  Potassium is low slightly at 3.2.  Lipase is not elevated, abdomen is soft nontender nondistended and at the time of my exam she states she feels better and wishes to go home.  I recommended miralax for use as needed,  We discussed that it can take 1-3 days to see a change in bowel movents.   Return precautions were discussed with patient who states their  understanding.  At the time of discharge patient denied any unaddressed complaints or concerns.  Patient is agreeable for discharge home.  Note: Portions of this report may have been transcribed using voice recognition software. Every effort was made to ensure accuracy; however, inadvertent computerized transcription errors may be present  Final Clinical Impression(s) / ED Diagnoses Final diagnoses:  Rectal pain  Drug-induced constipation    Rx / DC Orders ED Discharge Orders         Ordered    polyethylene glycol (MIRALAX / GLYCOLAX) 17 g packet  Daily PRN        06/19/20 2118           Lorin Glass, Hershal Coria 06/19/20 2340    Pattricia Boss, MD 06/20/20 1500

## 2020-06-24 ENCOUNTER — Telehealth: Payer: Self-pay | Admitting: Family Medicine

## 2020-06-24 ENCOUNTER — Other Ambulatory Visit: Payer: Self-pay | Admitting: *Deleted

## 2020-06-24 ENCOUNTER — Telehealth: Payer: Self-pay | Admitting: *Deleted

## 2020-06-24 DIAGNOSIS — I71019 Dissection of thoracic aorta, unspecified: Secondary | ICD-10-CM

## 2020-06-24 DIAGNOSIS — I7101 Dissection of thoracic aorta: Secondary | ICD-10-CM

## 2020-06-24 MED ORDER — GABAPENTIN 100 MG PO CAPS: 100.0000 mg | ORAL_CAPSULE | Freq: Every day | ORAL | 0 refills | Status: DC

## 2020-06-24 NOTE — Telephone Encounter (Signed)
Received call from home health nurse [place] regarding patient. Patient has a red irritated bump on her incision that is likely due to a knot in the suture. Advised that the suture would dissolve over time. Pt is also complaining of numbness from her incision up to her face that started 2 days ago and continued left arm pain. The [facility] nurse requested gabapentin for the arm pain and wanted to know if the patient could resume her CPAP. After speaking with Dr. Oneida Alar, I advised that the suture would dissolve over time and the face numbness sometimes happens after carotid surgery. The pt can resume CPAP. Gabapentin was ordered.

## 2020-06-24 NOTE — Telephone Encounter (Signed)
Informed RN that Dr B was out of the office until next week. Since it was a new medication the patient may need to be seen.   RN stated that she would ask the surgeon and if he was unable to prescribe it she would call back next week.

## 2020-06-24 NOTE — Telephone Encounter (Signed)
Diane rn [facility] is calling and would like to know if mf  would prescribe this patient  gabapentin for nerve pain. [facility] [address] phone number [phone]

## 2020-07-01 ENCOUNTER — Ambulatory Visit: Payer: Federal, State, Local not specified - PPO | Admitting: Family Medicine

## 2020-07-01 ENCOUNTER — Encounter: Payer: Self-pay | Admitting: Family Medicine

## 2020-07-01 ENCOUNTER — Other Ambulatory Visit: Payer: Self-pay

## 2020-07-01 VITALS — BP 121/77 | HR 89 | Wt 174.0 lb

## 2020-07-01 DIAGNOSIS — Z9889 Other specified postprocedural states: Secondary | ICD-10-CM

## 2020-07-01 DIAGNOSIS — R200 Anesthesia of skin: Secondary | ICD-10-CM

## 2020-07-01 DIAGNOSIS — L910 Hypertrophic scar: Secondary | ICD-10-CM | POA: Diagnosis not present

## 2020-07-01 DIAGNOSIS — R202 Paresthesia of skin: Secondary | ICD-10-CM

## 2020-07-01 DIAGNOSIS — I10 Essential (primary) hypertension: Secondary | ICD-10-CM | POA: Diagnosis not present

## 2020-07-01 DIAGNOSIS — Z8679 Personal history of other diseases of the circulatory system: Secondary | ICD-10-CM

## 2020-07-01 DIAGNOSIS — G629 Polyneuropathy, unspecified: Secondary | ICD-10-CM | POA: Insufficient documentation

## 2020-07-01 NOTE — Assessment & Plan Note (Signed)
Followed by vascular surgery She reports having some atypical pain after this, this not really chest pain Advised her that she does need to follow-up with her cardiologist No red flags currently

## 2020-07-01 NOTE — Assessment & Plan Note (Signed)
Early keloid formation on right supraclavicular surgical incision Advised scar massage, sunscreen over scar and avoiding sun exposure If does not improve or worsens, will refer to dermatology

## 2020-07-01 NOTE — Patient Instructions (Signed)
Follow-up with Dr. Clayborn Bigness  Phone: [phone]

## 2020-07-01 NOTE — Assessment & Plan Note (Signed)
Well controlled Continue current medications Reviewed recent metabolic panel 

## 2020-07-01 NOTE — Assessment & Plan Note (Signed)
Numbness follows the median nerve path of her left hand She did not have any residual numbness after her stroke in this hand and her weakness has improved She does not have any cervical neck findings and cervical spine x-ray was relatively benign Suspect that her supraclavicular swelling as a result of her recent surgeries is causing some residual numbness Discussed red flags and return precautions Advised ice, movement, elevation as possible to decrease swelling Follow-up with vascular surgeons if numbness is not improving and may consider neurology versus neurosurgery referral at that time

## 2020-07-01 NOTE — Progress Notes (Signed)
Established patient visit   Patient: Angel Lane   DOB: Apr 28, 1950   70 y.o. Female  MRN: [id number] Visit Date: 07/01/2020  Today's healthcare provider: Lavon Paganini, MD   Chief Complaint  Patient presents with  . Arm Pain   Subjective    Arm Pain  Associated symptoms include numbness (left hand). Pertinent negatives include no chest pain.    She reports not feeling to well since her last visit. She has a "knot" on her upper right shoulder and she is experiencing tightness in her neck. These symptoms are post her neck surgery. She has seen her vascular doctor and physical therapist. She is healing well and tolerating her at home therapy sessions.     Also, she has pain, swelling, numbness, and tingling in her left hand that feels sharp. She is currently taking gabapentin for her nerve pain, but she is experiencing drowsiness. The gabapentin has helped some.   She also endorses having tingling in her right leg but denies it being due to her recover from her stroke. She feels like there may likely be some nerve damage in that area.  She sometimes feels "pins" in her back when she lays on her left side, although there is relief when she lays on her right side. She denies chest pain, SOB, fever, abdominal pain, cough, chills, sore throat, dysuria, urinary incontinence, HA, or N/VD.     Follow up for arm numbness  The patient was last seen for this 2 weeks ago. Changes made at last visit include starting gabapentin.  She reports excellent compliance with treatment. She feels that condition is Improved. She is having side effects. Pt states the medicine is causing fatigue.  -----------------------------------------------------------------------------------------    Patient Active Problem List   Diagnosis Date Noted  . Numbness and tingling of left hand 07/01/2020  . Keloid 07/01/2020  . Thoracic aortic dissection ([place]) 05/28/2020  . History of repair of  dissecting thoracic aneurysm 05/28/2020  . Coronary artery calcification   . Aortic dissection distal to left subclavian ([place]) 05/10/2020  . 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 History of CVA (cerebrovascular accident) 02/19/2020  . OSA on CPAP 02/19/2020  . DOE (dyspnea on exertion) 02/19/2020  . Breast cancer screening by mammogram 02/19/2020  . Palpitations 02/19/2020  . Left hip pain 02/19/2020  . Avitaminosis D 07/02/2019  . Trochanteric bursitis of both hips 07/02/2019  . Pure hypercholesterolemia 10/08/2017  . Hyperglycemia 10/08/2017  . Insomnia 03/23/2017  . Tobacco abuse disorder 01/15/2017  . Left hemiparesis ([place]) 01/06/2017  . HTN (hypertension) 01/06/2017   Past Medical History:  Diagnosis Date  . Acute respiratory failure with hypoxia ([place]) 05/08/2020  . Anemia   . Depression   . Dyspnea    due to stroke  . HCAP (healthcare-associated pneumonia) 05/08/2020  . Hypertension   . Neuromuscular disorder ([place])    left sided weakness from stroke  . Sleep apnea   . Stroke [facility])    November 2018    Social History   Tobacco Use  . Smoking status: Former Smoker    Packs/day: 1.00    Years: 53.00    Pack years: 53.00    Types: Cigarettes    Quit date: 06/04/2020    Years since quitting: 0.0  . Smokeless tobacco: Never Used  . Tobacco comment: Less than 1/2 PPD  Vaping Use  . Vaping Use: Former  Substance Use Topics  . Alcohol use: No  .  Drug use: No   Allergies  Allergen Reactions  . Penicillins Other (See Comments)    chills     Medications: Outpatient Medications Prior to Visit  Medication Sig  . acetaminophen (TYLENOL) 500 MG tablet Take 500 mg by mouth every 6 (six) hours as needed for moderate pain.  . carvedilol (COREG) 6.25 MG tablet Take 1 tablet (6.25 mg total) by mouth 2 (two) times daily with a meal.  . gabapentin (NEURONTIN) 100 MG capsule Take 1 capsule (100 mg total) by mouth at bedtime.  . nicotine  (NICODERM CQ - DOSED IN MG/24 HOURS) 21 mg/24hr patch Place 1 patch (21 mg total) onto the skin daily.  . rosuvastatin (CRESTOR) 5 MG tablet Take 1 tablet (5 mg total) by mouth daily.  . hydrochlorothiazide (HYDRODIURIL) 12.5 MG tablet Take 2 tablets (25 mg total) by mouth daily.  . [DISCONTINUED] amLODipine (NORVASC) 2.5 MG tablet Take 1 tablet (2.5 mg total) by mouth daily. (Patient not taking: No sig reported)  . [DISCONTINUED] oxyCODONE-acetaminophen (PERCOCET/ROXICET) 5-325 MG tablet Take 1 tablet by mouth every 6 (six) hours as needed for moderate pain.  . [DISCONTINUED] polyethylene glycol (MIRALAX / GLYCOLAX) 17 g packet Take 17 g by mouth daily as needed for mild constipation, moderate constipation or severe constipation.  . [DISCONTINUED] predniSONE (DELTASONE) 20 MG tablet Take 1 tablet (20 mg total) by mouth daily with breakfast.   No facility-administered medications prior to visit.    Review of Systems  Constitutional: Negative for activity change, fatigue and fever.  HENT: Negative for congestion, ear pain, sinus pressure, sinus pain, sneezing and sore throat.   Eyes: Negative for pain.  Respiratory: Negative for cough, choking, shortness of breath and wheezing.   Cardiovascular: Negative for chest pain, palpitations and leg swelling.  Gastrointestinal: Negative for abdominal pain, blood in stool, diarrhea, nausea and vomiting.  Genitourinary: Negative for flank pain, frequency, pelvic pain and urgency.  Musculoskeletal: Positive for back pain, neck pain (right) and neck stiffness. Negative for joint swelling.  Neurological: Positive for numbness (left hand). Negative for dizziness, weakness and headaches.        Objective    BP 121/77 (BP Location: Right Arm, Patient Position: Sitting, Cuff Size: Large)   Pulse 89   Wt 174 lb (78.9 kg)   BMI 28.96 kg/m    Physical Exam Vitals reviewed.  Constitutional:      General: She is not in acute distress.    Appearance:  Normal appearance. She is well-developed. She is not diaphoretic.  HENT:     Head: Normocephalic and atraumatic.  Eyes:     General: No scleral icterus.    Conjunctiva/sclera: Conjunctivae normal.  Neck:     Thyroid: No thyromegaly.     Comments: Keloid visible on right surgical siit of neck, supraclavicular swelling bilaterally Cardiovascular:     Rate and Rhythm: Normal rate and regular rhythm.     Pulses: Normal pulses.     Heart sounds: Normal heart sounds. No murmur heard.   Pulmonary:     Effort: Pulmonary effort is normal. No respiratory distress.     Breath sounds: Normal breath sounds. No wheezing, rhonchi or rales.  Musculoskeletal:     Cervical back: Neck supple.     Right lower leg: No edema.     Left lower leg: No edema.  Lymphadenopathy:     Cervical: No cervical adenopathy.  Skin:    General: Skin is warm and dry.     Findings: No rash.  Neurological:     Mental Status: She is alert and oriented to person, place, and time. Mental status is at baseline.  Psychiatric:        Mood and Affect: Mood normal.        Behavior: Behavior normal.      No results found for any visits on 07/01/20.  Assessment & Plan     Problem List Items Addressed This Visit      Cardiovascular and Mediastinum   HTN (hypertension) - Primary    Well controlled Continue current medications Reviewed recent metabolic panel        Musculoskeletal and Integument   Keloid    Early keloid formation on right supraclavicular surgical incision Advised scar massage, sunscreen over scar and avoiding sun exposure If does not improve or worsens, will refer to dermatology        Other   History of repair of dissecting thoracic aneurysm    Followed by vascular surgery She reports having some atypical pain after this, this not really chest pain Advised her that she does need to follow-up with her cardiologist No red flags currently      Numbness and tingling of left hand    Numbness  follows the median nerve path of her left hand She did not have any residual numbness after her stroke in this hand and her weakness has improved She does not have any cervical neck findings and cervical spine x-ray was relatively benign Suspect that her supraclavicular swelling as a result of her recent surgeries is causing some residual numbness Discussed red flags and return precautions Advised ice, movement, elevation as possible to decrease swelling Follow-up with vascular surgeons if numbness is not improving and may consider neurology versus neurosurgery referral at that time         Return in about 2 months (around 08/31/2020) for chronic disease f/u, as scheduled.       I,Essence Turner,acting as a Education administrator for Lavon Paganini, MD.,have documented all relevant documentation on the behalf of Lavon Paganini, MD,as directed by  Lavon Paganini, MD while in the presence of Lavon Paganini, MD.  I, Lavon Paganini, MD, have reviewed all documentation for this visit. The documentation on 07/01/20 for the exam, diagnosis, procedures, and orders are all accurate and complete.   Theophilus Walz, [name], MD, MPH [facility]

## 2020-07-02 ENCOUNTER — Telehealth: Payer: Self-pay

## 2020-07-02 NOTE — Telephone Encounter (Signed)
Copied from [place] [id number]. Topic: General - Other >> Jul 02, 2020  3:58 PM Tessa Lerner A wrote: Reason for CRM: Diane with [facility] has made contact to notify the practice that patient has declined home visits for the week of 5/1-5/7 due to traveling out of town for a loss in their family   Patient is set to resume visits with [facility] the following week beginning 07/11/20  Please contact to further advise if needed

## 2020-07-05 NOTE — Telephone Encounter (Signed)
Noted  

## 2020-07-08 ENCOUNTER — Other Ambulatory Visit: Payer: Self-pay | Admitting: Family Medicine

## 2020-07-08 NOTE — Telephone Encounter (Signed)
Pt called stating that she only has one pill left. She states that she is going out of town this weekend and is needing to have this before she leaves. Please advise.

## 2020-07-08 NOTE — Telephone Encounter (Signed)
Patient called in to ask Dr B if this refill can be sent to the pharmacy today please because she only have enough for today. Please advise

## 2020-07-08 NOTE — Telephone Encounter (Signed)
Requested medication (s) are due for refill today:   Yes  Requested medication (s) are on the active medication list:   Yes  Future visit scheduled:   Yes   Last ordered: 05/08/2020 #60, 0 refills  This is from her hospitalization.   Prescribed by another provider.     Requested Prescriptions  Pending Prescriptions Disp Refills   hydrochlorothiazide (HYDRODIURIL) 12.5 MG tablet [Pharmacy Med Name: HYDROCHLOROTHIAZIDE 12.5 MG TB] 90 tablet     Sig: TAKE 1 TABLET BY MOUTH EVERY DAY      Cardiovascular: Diuretics - Thiazide Failed - 07/08/2020 11:16 AM      Failed - K in normal range and within 360 days    Potassium  Date Value Ref Range Status  06/19/2020 3.2 (L) 3.5 - 5.1 mmol/L Final  10/29/2011 3.3 (L) 3.5 - 5.1 mmol/L Final          Passed - Ca in normal range and within 360 days    Calcium  Date Value Ref Range Status  06/19/2020 9.5 8.9 - 10.3 mg/dL Final   Calcium, Total  Date Value Ref Range Status  10/29/2011 9.1 8.5 - 10.1 mg/dL Final   Calcium, Ion  Date Value Ref Range Status  05/18/2020 1.24 1.15 - 1.40 mmol/L Final          Passed - Cr in normal range and within 360 days    Creatinine  Date Value Ref Range Status  10/29/2011 0.87 0.60 - 1.30 mg/dL Final   Creatinine, Ser  Date Value Ref Range Status  06/19/2020 0.57 0.44 - 1.00 mg/dL Final          Passed - Na in normal range and within 360 days    Sodium  Date Value Ref Range Status  06/19/2020 136 135 - 145 mmol/L Final  02/19/2020 141 134 - 144 mmol/L Final  10/29/2011 141 136 - 145 mmol/L Final          Passed - Last BP in normal range    BP Readings from Last 1 Encounters:  07/01/20 121/77          Passed - Valid encounter within last 6 months    Recent Outpatient Visits           1 week ago Primary hypertension   [facility] [place], [name], MD   3 weeks ago Left arm numbness   [facility] Jerrol Banana., MD   4 months ago Encounter for  annual physical exam   [facility] [place], [name], MD   10 months ago Acute non-recurrent pansinusitis   [facility] [place], Clearnce Sorrel, [place]   1 year ago Essential hypertension   [place], [name], MD       Future Appointments             In 1 month Bacigalupo, [name], MD [facility], [place]

## 2020-07-13 ENCOUNTER — Telehealth: Payer: Self-pay

## 2020-07-13 NOTE — Telephone Encounter (Signed)
Patient reports that the setting is not correct that it reads "L". I have wrote a order for resetting to 12. Please advise.

## 2020-07-13 NOTE — Telephone Encounter (Signed)
Copied from [place] [id number]. Topic: General - Inquiry >> Jul 13, 2020  9:17 AM Greggory Keen D wrote: Reason for CRM: Pt has a c-pap that she uses.  She needs the pressure reset.  She spoke to [facility] and they said they can reset it if they get an order from Dr. Jacinto Reap.   [facility]'s # is [phone].  Pt states she know the pressure is not correct.  She would like this to be done today if possible.  They can do this remotely.

## 2020-07-13 NOTE — Telephone Encounter (Signed)
We need to know what pressure setting it is currently set at.  She was recommended to have a pressure setting of 12 after her last sleep study.  This is what I recommend.  If she is unable to tolerate this, we could try dropping it to 10, knowing that this may not be as helpful as the 12, but may be more tolerable so she will actually use it.

## 2020-07-14 ENCOUNTER — Telehealth: Payer: Self-pay

## 2020-07-14 NOTE — Telephone Encounter (Signed)
Copied from [place] [id number]. Topic: General - Other >> Jul 14, 2020  4:42 PM Angel Lane A wrote: Reason for CRM: Patient has made an additional call regarding their CPAP machine  Patient would like to know if an order has been sent to [facility] regarding the machine's reset  Please contact to further advise when possible

## 2020-07-15 NOTE — Telephone Encounter (Signed)
Provider is aware. KW

## 2020-07-15 NOTE — Telephone Encounter (Signed)
Pt is calling and [facility] is waiting for correct order

## 2020-07-15 NOTE — Telephone Encounter (Signed)
It has been signed and faxed today

## 2020-07-15 NOTE — Telephone Encounter (Signed)
See previous telephone encounter. KW

## 2020-07-15 NOTE — Telephone Encounter (Signed)
Okay.  I will sign the order for them to corrected to 12 and we can send that off today

## 2020-07-19 ENCOUNTER — Other Ambulatory Visit: Payer: Self-pay

## 2020-07-19 ENCOUNTER — Ambulatory Visit (INDEPENDENT_AMBULATORY_CARE_PROVIDER_SITE_OTHER): Payer: Federal, State, Local not specified - PPO | Admitting: Physician Assistant

## 2020-07-19 VITALS — BP 119/72 | HR 82 | Temp 97.7°F | Resp 20 | Ht 65.0 in | Wt 176.7 lb

## 2020-07-19 DIAGNOSIS — O009 Unspecified ectopic pregnancy without intrauterine pregnancy: Secondary | ICD-10-CM

## 2020-07-19 DIAGNOSIS — I7101 Dissection of thoracic aorta: Secondary | ICD-10-CM

## 2020-07-19 DIAGNOSIS — IMO0002 Reserved for concepts with insufficient information to code with codable children: Secondary | ICD-10-CM | POA: Insufficient documentation

## 2020-07-19 DIAGNOSIS — Z8619 Personal history of other infectious and parasitic diseases: Secondary | ICD-10-CM | POA: Insufficient documentation

## 2020-07-19 DIAGNOSIS — B86 Scabies: Secondary | ICD-10-CM | POA: Insufficient documentation

## 2020-07-19 DIAGNOSIS — R0683 Snoring: Secondary | ICD-10-CM | POA: Insufficient documentation

## 2020-07-19 DIAGNOSIS — I71019 Dissection of thoracic aorta, unspecified: Secondary | ICD-10-CM

## 2020-07-19 HISTORY — DX: Unspecified ectopic pregnancy without intrauterine pregnancy: O00.90

## 2020-07-19 NOTE — Progress Notes (Signed)
Office Note     CC:  follow up Requesting Provider:  Virginia Crews, MD  HPI: Angel Lane is a 70 y.o. (08/06/1950) female who presents for evaluation of right neck redness around incision.  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 Oneida Alar.  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  She denies any drainage from right neck incision.  She denies any shortness of breath chest pain, or back pain.  She also denies any claudication, rest pain, or nonhealing wounds of bilateral lower extremities.  Right groin incision is well-healed per patient.  She is a former smoker.   Past Medical History:  Diagnosis Date  . Acute respiratory failure with hypoxia ([place]) 05/08/2020  . Anemia   . Depression   . Dyspnea    due to stroke  . HCAP (healthcare-associated pneumonia) 05/08/2020  . Hypertension   . 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 Sleep apnea   . Stroke [facility])    November 2018     Past Surgical History:  Procedure Laterality Date  . ABDOMINAL SURGERY    . CARDIAC CATHETERIZATION    . CAROTID-SUBCLAVIAN BYPASS GRAFT Bilateral 05/14/2020   Procedure: BILATERAL CAROTID-SUBCLAVIAN  TRANSPOSITION;  Surgeon: Elam Dutch, MD;  Location: [facility] OR;  Service: Vascular;  Laterality: Bilateral;  . CATARACT EXTRACTION W/PHACO Left 05/16/2017   Procedure: CATARACT EXTRACTION PHACO AND INTRAOCULAR LENS PLACEMENT ([place]) LEFT;  Surgeon: Leandrew Koyanagi, MD;  Location: [place];  Service: Ophthalmology;  Laterality: Left;  . CATARACT EXTRACTION W/PHACO Right 06/13/2017   Procedure: CATARACT EXTRACTION PHACO AND INTRAOCULAR LENS PLACEMENT ([place]) RIGHT;  Surgeon: Leandrew Koyanagi, MD;  Location: [place];  Service: Ophthalmology;  Laterality: Right;  sleep apnea  . CHOLECYSTECTOMY    .  COLONOSCOPY N/A 01/21/2020   Procedure: COLONOSCOPY;  Surgeon: Toledo, Benay Pike, MD;  Location: [facility] ENDOSCOPY;  Service: Gastroenterology;  Laterality: N/A;  . THORACIC AORTIC ENDOVASCULAR STENT GRAFT N/A 05/14/2020   Procedure: THORACIC AORTIC ENDOVASCULAR STENT USING GORE TAG 29mm X 15cm GRAFT AND GORE TAG 71mm X 20cm GRAFT;  Surgeon: Elam Dutch, MD;  Location: [place];  Service: Vascular;  Laterality: N/A;  . ULTRASOUND GUIDANCE FOR VASCULAR ACCESS Bilateral 05/14/2020   Procedure: ULTRASOUND GUIDANCE FOR VASCULAR ACCESS;  Surgeon: Elam Dutch, MD;  Location: [place];  Service: Vascular;  Laterality: Bilateral;    Social History   Socioeconomic History  . Marital status: Married    Spouse name: Not on file  . Number of children: Not on file  . Years of education: Not on file  . Highest education level: Not on file  Occupational History  . Not on file  Tobacco Use  . Smoking status: Former Smoker    Packs/day: 1.00    Years: 53.00    Pack years: 53.00    Types: Cigarettes    Quit date: 06/04/2020    Years since quitting: 0.1  . Smokeless tobacco: Never Used  . Tobacco comment: Less than 1/2 PPD  Vaping Use  . Vaping Use: Former  Substance and Sexual Activity  . Alcohol use: No  . Drug use: No  . Sexual activity: Yes  Other Topics Concern  . Not on file  Social History Narrative  . Not on file   Social Determinants of Health  Financial Resource Strain: Not on file  Food Insecurity: Not on file  Transportation Needs: Not on file  Physical Activity: Not on file  Stress: Not on file  Social Connections: Not on file  Intimate Partner Violence: Not on file    Family History  Problem Relation Age of Onset  . Asthma Mother   . Heart failure Mother   . Hypertension Mother   . Heart attack Father   . Breast cancer Maternal Aunt   . Breast cancer Cousin        maternal 1st cousin    Current Outpatient Medications  Medication Sig Dispense Refill  .  acetaminophen (TYLENOL) 500 MG tablet Take 500 mg by mouth every 6 (six) hours as needed for moderate pain.    . carvedilol (COREG) 6.25 MG tablet Take 1 tablet (6.25 mg total) by mouth 2 (two) times daily with a meal. 60 tablet 1  . gabapentin (NEURONTIN) 100 MG capsule Take 1 capsule (100 mg total) by mouth at bedtime. 30 capsule 0  . hydrochlorothiazide (HYDRODIURIL) 12.5 MG tablet TAKE 1 TABLET BY MOUTH EVERY DAY 90 tablet 1  . nicotine (NICODERM CQ - DOSED IN MG/24 HOURS) 21 mg/24hr patch Place 1 patch (21 mg total) onto the skin daily. 28 patch 0  . rosuvastatin (CRESTOR) 5 MG tablet Take 1 tablet (5 mg total) by mouth daily. 30 tablet 5   No current facility-administered medications for this visit.    Allergies  Allergen Reactions  . Penicillins Other (See Comments)    chills     REVIEW OF SYSTEMS:   [X]  denotes positive finding, [ ]  denotes negative finding Cardiac  Comments:  Chest pain or chest pressure:    Shortness of breath upon exertion:    Short of breath when lying flat:    Irregular heart rhythm:        Vascular    Pain in calf, thigh, or hip brought on by ambulation:    Pain in feet at night that wakes you up from your sleep:     Blood clot in your veins:    Leg swelling:         Pulmonary    Oxygen at home:    Productive cough:     Wheezing:         Neurologic    Sudden weakness in arms or legs:     Sudden numbness in arms or legs:     Sudden onset of difficulty speaking or slurred speech:    Temporary loss of vision in one eye:     Problems with dizziness:         Gastrointestinal    Blood in stool:     Vomited blood:         Genitourinary    Burning when urinating:     Blood in urine:        Psychiatric    Major depression:         Hematologic    Bleeding problems:    Problems with blood clotting too easily:        Skin    Rashes or ulcers:        Constitutional    Fever or chills:      PHYSICAL EXAMINATION:  Vitals:   07/19/20  1019 07/19/20 1023  BP: 126/75 119/72  Pulse: 82   Resp: 20   Temp: 97.7 F (36.5 C)   TempSrc: Temporal   SpO2: 99%   Weight: 176  lb 11.2 oz (80.2 kg)   Height: 5\' 5"  (1.651 m)     General:  WDWN in NAD; vital signs documented above Gait: Not observed HENT: WNL, normocephalic; right neck incision well-healed with some redness and swelling at the lateralmost portion without any fluctuance or drainage Pulmonary: normal non-labored breathing Cardiac: regular HR Abdomen: soft, NT, no masses Skin: without rashes Vascular Exam/Pulses:  Right Left  Radial 2+ (normal) 2+ (normal)  DP 1+ (weak) 1+ (weak)   Extremities: No nonhealing wounds of bilateral lower extremities Musculoskeletal: no muscle wasting or atrophy  Neurologic: A&O X 3;  No focal weakness or paresthesias are detected Psychiatric:  The pt has Normal affect.    ASSESSMENT/PLAN:: 70 y.o. female here for evaluation of right neck redness and pain  -Lateral most portion of right neck incision likely in the process of spitting a suture however no skin interruption or fluctuance currently.  No indication for intervention currently. -Extremities are well perfused with intact distal pulses; she has some numbness and weakness in the first 3 fingers of her left hand however some is attributable to prior stroke -Plan is to repeat CTA chest abdomen pelvis and October with Dr. Oneida Alar -Patient can call/return to office sooner with any problems or concerns   Dagoberto Ligas, PA-C [facility] [phone]  Clinic MD:   Trula Slade

## 2020-07-20 ENCOUNTER — Encounter: Payer: Self-pay | Admitting: Family Medicine

## 2020-07-20 ENCOUNTER — Telehealth: Payer: Self-pay

## 2020-07-20 NOTE — Telephone Encounter (Signed)
Copied from [place] [phone]. Topic: General - Other >> Jul 14, 2020  4:42 PM Tessa Lerner A wrote: Reason for CRM: Patient has made an additional call regarding their CPAP machine  Patient would like to know if an order has been sent to [facility] regarding the machine's reset  Please contact to further advise when possible >> Jul 20, 2020 11:08 AM Keene Breath wrote: Patient is calling again to get an update on her cpap machine.  Please advise and call patient to give her the status of her request

## 2020-07-21 ENCOUNTER — Telehealth: Payer: Self-pay

## 2020-07-21 NOTE — Telephone Encounter (Signed)
It was signed and sent. Can someone check on it

## 2020-07-21 NOTE — Telephone Encounter (Signed)
Called patient to rescheduled her June 16 appointment that she had scheduled with Dr.B. patient scheduled.  Patient asking if the order for her cpap machine has been signed and faxed to [facility]? I told her that according to the notes that I see in her chart it was faxed on 05/10 per patient he was going to double check with them because they told her they were waiting on the order to be sign.

## 2020-07-22 NOTE — Telephone Encounter (Signed)
Order has been faxed and patient advised. Per medical records.

## 2020-07-22 NOTE — Telephone Encounter (Signed)
Please advise 

## 2020-07-22 NOTE — Telephone Encounter (Signed)
Patient advised order was faxed 07/16/20 and again today 07/22/20 to [facility]. TNP

## 2020-08-15 ENCOUNTER — Other Ambulatory Visit: Payer: Self-pay | Admitting: Family Medicine

## 2020-08-15 NOTE — Telephone Encounter (Signed)
Requested Prescriptions  Pending Prescriptions Disp Refills  . rosuvastatin (CRESTOR) 5 MG tablet [Pharmacy Med Name: ROSUVASTATIN CALCIUM 5 MG TAB] 90 tablet 1    Sig: TAKE 1 TABLET (5 MG TOTAL) BY MOUTH DAILY.     Cardiovascular:  Antilipid - Statins Failed - 08/15/2020  9:15 AM      Failed - LDL in normal range and within 360 days    LDL Chol Calc (NIH)  Date Value Ref Range Status  02/19/2020 105 (H) 0 - 99 mg/dL Final         Passed - Total Cholesterol in normal range and within 360 days    Cholesterol, Total  Date Value Ref Range Status  02/19/2020 170 100 - 199 mg/dL Final         Passed - HDL in normal range and within 360 days    HDL  Date Value Ref Range Status  02/19/2020 51 >39 mg/dL Final         Passed - Triglycerides in normal range and within 360 days    Triglycerides  Date Value Ref Range Status  05/06/2020 62 <150 mg/dL Final    Comment:    Performed at [facility], [place] [address] [place], [place] [address]         Passed - Patient is not pregnant      Passed - Valid encounter within last 12 months    Recent Outpatient Visits          1 month ago Primary hypertension   [facility] Virginia Crews, MD   2 months ago Left arm numbness   [facility] Jerrol Banana., MD   5 months ago Encounter for annual physical exam   [facility] [place], [name], MD   11 months ago Acute non-recurrent pansinusitis   [facility] [place], Clearnce Sorrel, [place]   1 year ago Essential hypertension   [place], [name], MD      Future Appointments            In 1 month Bacigalupo, [name], MD [facility], [place]

## 2020-08-19 ENCOUNTER — Ambulatory Visit: Payer: Self-pay | Admitting: Family Medicine

## 2020-09-16 ENCOUNTER — Other Ambulatory Visit: Payer: Self-pay | Admitting: Family Medicine

## 2020-09-16 NOTE — Telephone Encounter (Signed)
  Notes to clinic:  Medication filled by different provider Review for refill    Requested Prescriptions  Pending Prescriptions Disp Refills   carvedilol (COREG) 6.25 MG tablet 60 tablet 1    Sig: Take 1 tablet (6.25 mg total) by mouth 2 (two) times daily with a meal.      Cardiovascular:  Beta Blockers Passed - 09/16/2020  3:06 PM      Passed - Last BP in normal range    BP Readings from Last 1 Encounters:  07/19/20 119/72          Passed - Last Heart Rate in normal range    Pulse Readings from Last 1 Encounters:  07/19/20 82          Passed - Valid encounter within last 6 months    Recent Outpatient Visits           2 months ago Primary hypertension   [facility] Virginia Crews, MD   3 months ago Left arm numbness   [facility] Jerrol Banana., MD   7 months ago Encounter for annual physical exam   [facility] [place], [name], MD   1 year ago Acute non-recurrent pansinusitis   [facility] [place], Clearnce Sorrel, [place]   1 year ago Essential hypertension   [place], [name], MD       Future Appointments             In 1 week Bacigalupo, [name], MD [facility], [place]

## 2020-09-16 NOTE — Telephone Encounter (Signed)
Medication Refill - Medication: Carvidilol 6.25 mg  Has the patient contacted their pharmacy? Yes.  They told her to call the office (Agent: If no, request that the patient contact the pharmacy for the refill.) (Agent: If yes, when and what did the pharmacy advise?)  Preferred Pharmacy (with phone number or street name): [facility]  Agent: Please be advised that RX refills may take up to 3 business days. We ask that you follow-up with your pharmacy.

## 2020-09-22 ENCOUNTER — Telehealth: Payer: Self-pay

## 2020-09-22 MED ORDER — CARVEDILOL 6.25 MG PO TABS: 6.2500 mg | ORAL_TABLET | Freq: Two times a day (BID) | ORAL | 1 refills | Status: DC

## 2020-09-22 NOTE — Telephone Encounter (Signed)
Patient advised her appt is still on the schedule for 09/27/20 at 1:00 p.m.

## 2020-09-22 NOTE — Telephone Encounter (Signed)
Copied from [place] [phone]. Topic: Appointment Scheduling - Scheduling Inquiry for Clinic >> Sep 22, 2020 12:18 PM Alanda Slim E wrote: Reason for CRM: Pt called and stated she thinks she may have hit the cancel button during th appt reminder call and wants office to know she wants to confirm that appt and keep it/ please advise   Appt was still showing up when I opened chart and confirmed

## 2020-09-27 ENCOUNTER — Ambulatory Visit: Payer: Self-pay | Admitting: Family Medicine

## 2020-09-27 NOTE — Progress Notes (Deleted)
      Established patient visit   Patient: Angel Lane   DOB: [birth year]   70 y.o. Female  MRN: [id number] Visit Date: 09/27/2020  Today's healthcare provider: Lavon Paganini, MD   No chief complaint on file.  Subjective    HPI  Hypertension, follow-up  BP Readings from Last 3 Encounters:  07/19/20 119/72  07/01/20 121/77  06/19/20 (!) 162/79   Wt Readings from Last 3 Encounters:  07/19/20 176 lb 11.2 oz (80.2 kg)  07/01/20 174 lb (78.9 kg)  06/17/20 173 lb (78.5 kg)     She was last seen for hypertension 3 months ago.  BP at that visit was as above. Management since that visit includes none.  She reports {excellent/good/fair/poor:19665} compliance with treatment. She {is/is not:9024} having side effects. {document side effects if present:1} She is following a {diet:21022986} diet. She {is/is not:9024} exercising. She {does/does not:200015} smoke.  Use of agents associated with hypertension: {bp agents assoc with hypertension:511::"none"}.   Outside blood pressures are {***enter patient reported home BP readings, or 'not being checked':1}. Symptoms: {Yes/No:20286} chest pain {Yes/No:20286} chest pressure  {Yes/No:20286} palpitations {Yes/No:20286} syncope  {Yes/No:20286} dyspnea {Yes/No:20286} orthopnea  {Yes/No:20286} paroxysmal nocturnal dyspnea {Yes/No:20286} lower extremity edema   Pertinent labs: Lab Results  Component Value Date   CHOL 170 02/19/2020   HDL 51 02/19/2020   LDLCALC 105 (H) 02/19/2020   TRIG 62 05/06/2020   CHOLHDL 3.3 02/19/2020   Lab Results  Component Value Date   NA 136 06/19/2020   K 3.2 (L) 06/19/2020   CREATININE 0.57 06/19/2020   GFRNONAA >60 06/19/2020   GFRAA 78 02/19/2020   GLUCOSE 128 (H) 06/19/2020     The ASCVD Risk score (Goff DC Jr., et al., 2013) failed to calculate for the following reasons:   The patient has a prior MI or stroke diagnosis    ---------------------------------------------------------------------------------------------------   {Show patient history (optional):23778}   Medications: Outpatient Medications Prior to Visit  Medication Sig   acetaminophen (TYLENOL) 500 MG tablet Take 500 mg by mouth every 6 (six) hours as needed for moderate pain.   carvedilol (COREG) 6.25 MG tablet Take 1 tablet (6.25 mg total) by mouth 2 (two) times daily with a meal.   gabapentin (NEURONTIN) 100 MG capsule Take 1 capsule (100 mg total) by mouth at bedtime.   hydrochlorothiazide (HYDRODIURIL) 12.5 MG tablet TAKE 1 TABLET BY MOUTH EVERY DAY   nicotine (NICODERM CQ - DOSED IN MG/24 HOURS) 21 mg/24hr patch Place 1 patch (21 mg total) onto the skin daily.   rosuvastatin (CRESTOR) 5 MG tablet TAKE 1 TABLET (5 MG TOTAL) BY MOUTH DAILY.   No facility-administered medications prior to visit.    Review of Systems  {Labs  Heme  Chem  Endocrine  Serology  Results Review (optional):23779}   Objective    There were no vitals taken for this visit. {Show previous vital signs (optional):23777}   Physical Exam  ***  No results found for any visits on 09/27/20.  Assessment & Plan     ***  No follow-ups on file.      {provider attestation***:1}   Lavon Paganini, MD  [facility] [phone] (phone) [phone] (fax)  [place]

## 2020-09-30 ENCOUNTER — Ambulatory Visit: Payer: Federal, State, Local not specified - PPO | Admitting: Family Medicine

## 2020-09-30 ENCOUNTER — Other Ambulatory Visit: Payer: Self-pay

## 2020-09-30 ENCOUNTER — Encounter: Payer: Self-pay | Admitting: Family Medicine

## 2020-09-30 VITALS — BP 122/75 | HR 77 | Temp 98.5°F | Wt 187.0 lb

## 2020-09-30 DIAGNOSIS — R739 Hyperglycemia, unspecified: Secondary | ICD-10-CM

## 2020-09-30 DIAGNOSIS — I10 Essential (primary) hypertension: Secondary | ICD-10-CM

## 2020-09-30 DIAGNOSIS — G629 Polyneuropathy, unspecified: Secondary | ICD-10-CM

## 2020-09-30 DIAGNOSIS — G8194 Hemiplegia, unspecified affecting left nondominant side: Secondary | ICD-10-CM | POA: Diagnosis not present

## 2020-09-30 DIAGNOSIS — E7800 Pure hypercholesterolemia, unspecified: Secondary | ICD-10-CM

## 2020-09-30 DIAGNOSIS — G4733 Obstructive sleep apnea (adult) (pediatric): Secondary | ICD-10-CM

## 2020-09-30 DIAGNOSIS — Z9989 Dependence on other enabling machines and devices: Secondary | ICD-10-CM

## 2020-09-30 MED ORDER — PREGABALIN 75 MG PO CAPS: 75.0000 mg | ORAL_CAPSULE | Freq: Two times a day (BID) | ORAL | 2 refills | Status: DC

## 2020-09-30 NOTE — Assessment & Plan Note (Signed)
Uncontrolled at home, but well controlled her today Reports fluctuations in BP Taking her meds prn - states she was told to in the hospital Will use coreg regularly BID Has f/u in 1 m with Cardiology

## 2020-09-30 NOTE — Assessment & Plan Note (Signed)
Chronic and stable H/o stroke

## 2020-09-30 NOTE — Assessment & Plan Note (Signed)
Not tolerating pressure from CPAP again Had recent adjustment Will refer to Pulm/sleep medicine for further management

## 2020-09-30 NOTE — Assessment & Plan Note (Signed)
Ongoing neuropathy  Did not tolerate low dose gabapentin Trial of lyrica

## 2020-09-30 NOTE — Progress Notes (Signed)
Established patient visit   Patient: Angel Lane   DOB: 1950-04-01   70 y.o. Female  MRN: [id number] Visit Date: 09/30/2020  Today's healthcare provider: Lavon Paganini, MD   Chief Complaint  Patient presents with   Hypertension    Subjective    HPI   Hypertension, follow-up  BP Readings from Last 3 Encounters:  09/30/20 122/75  07/19/20 119/72  07/01/20 121/77   Wt Readings from Last 3 Encounters:  09/30/20 187 lb (84.8 kg)  07/19/20 176 lb 11.2 oz (80.2 kg)  07/01/20 174 lb (78.9 kg)     She was last seen for hypertension 3 months ago.  BP at that visit was as above. Management since that visit includes none.    She reports good compliance with treatment.  However the patient reports that she takes her blood pressure medication only when her pressure is high. She is not having side effects.  Use of agents associated with hypertension: none.   Outside blood pressures: patient states she checks her blood pressure 3 and 4 times a day.  She states her readings are good during the day but then up at night. She reports that the readings have been running systolic of [id number] and diastolic of [id number].    When questioned on this she insisted that her systolic last night was 60. She was recommended to take the HCTZ and carvedilol as needed. She will take her medication when she notices her blood pressure is in the 130s. She seen vascular last month and her follow-up is in 3 months.   Nerve Pain  She is still feeling pins in her hands and neck and tingling in her right leg. She was taking gabapentin but she report it doesn't make her feel well. She denies taking lyrica and is amenable to starting this medication for her nerve pain. She will check back in with an up-date in about 3 months.   Sleep apnea  She is having a problem with the pressure of the cpap. She believes it is set at 12 cmH2O. She reports it feels like she is suffocating however she is doing well  sleeping at night. She is interested in getting the pressure adjusted to make the device more comfortable to sleep with.    Symptoms: No chest pain No chest pressure  Yes palpitations No syncope  Yes dyspnea No orthopnea  No paroxysmal nocturnal dyspnea No lower extremity edema   Pertinent labs: Lab Results  Component Value Date   CHOL 170 02/19/2020   HDL 51 02/19/2020   LDLCALC 105 (H) 02/19/2020   TRIG 62 05/06/2020   CHOLHDL 3.3 02/19/2020   Lab Results  Component Value Date   NA 136 06/19/2020   K 3.2 (L) 06/19/2020   CREATININE 0.57 06/19/2020   GFRNONAA >60 06/19/2020   GFRAA 78 02/19/2020   GLUCOSE 128 (H) 06/19/2020     The ASCVD Risk score (Goff DC Jr., et al., 2013) failed to calculate for the following reasons:   The patient has a prior MI or stroke diagnosis   ---------------------------------------------------------------------------------------------------     Medications: Outpatient Medications Prior to Visit  Medication Sig   acetaminophen (TYLENOL) 500 MG tablet Take 500 mg by mouth every 6 (six) hours as needed for moderate pain.   carvedilol (COREG) 6.25 MG tablet Take 1 tablet (6.25 mg total) by mouth 2 (two) times daily with a meal.   hydrochlorothiazide (HYDRODIURIL) 12.5 MG tablet TAKE 1 TABLET BY MOUTH EVERY  DAY   rosuvastatin (CRESTOR) 5 MG tablet TAKE 1 TABLET (5 MG TOTAL) BY MOUTH DAILY.   [DISCONTINUED] gabapentin (NEURONTIN) 100 MG capsule Take 1 capsule (100 mg total) by mouth at bedtime.   [DISCONTINUED] nicotine (NICODERM CQ - DOSED IN MG/24 HOURS) 21 mg/24hr patch Place 1 patch (21 mg total) onto the skin daily.   No facility-administered medications prior to visit.    Review of Systems  Constitutional:  Negative for chills, fatigue and fever.  HENT:  Negative for ear pain, sinus pressure, sinus pain and sore throat.   Eyes:  Negative for pain and visual disturbance.  Respiratory:  Negative for cough, chest tightness, shortness of  breath and wheezing.   Cardiovascular:  Positive for palpitations. Negative for chest pain and leg swelling.  Gastrointestinal:  Negative for abdominal pain, blood in stool, constipation, diarrhea, nausea and vomiting.  Genitourinary:  Negative for flank pain, frequency, pelvic pain and urgency.  Musculoskeletal:  Negative for back pain, myalgias and neck pain.  Neurological:  Positive for dizziness, numbness and headaches. Negative for weakness and light-headedness.      Objective    BP 122/75 (BP Location: Left Arm, Patient Position: Sitting, Cuff Size: Normal)   Pulse 77   Temp 98.5 F (36.9 C) (Oral)   Wt 187 lb (84.8 kg)   SpO2 98%   BMI 31.12 kg/m     Physical Exam Vitals reviewed.  Constitutional:      General: She is not in acute distress.    Appearance: Normal appearance. She is well-developed. She is not diaphoretic.  HENT:     Head: Normocephalic and atraumatic.  Eyes:     General: No scleral icterus.    Conjunctiva/sclera: Conjunctivae normal.  Neck:     Thyroid: No thyromegaly.  Cardiovascular:     Rate and Rhythm: Normal rate and regular rhythm.     Pulses: Normal pulses.     Heart sounds: Normal heart sounds. No murmur heard. Pulmonary:     Effort: Pulmonary effort is normal. No respiratory distress.     Breath sounds: Normal breath sounds. No wheezing, rhonchi or rales.  Musculoskeletal:     Cervical back: Neck supple.     Right lower leg: No edema.     Left lower leg: No edema.  Lymphadenopathy:     Cervical: No cervical adenopathy.  Skin:    General: Skin is warm and dry.     Findings: No rash.  Neurological:     Mental Status: She is alert and oriented to person, place, and time. Mental status is at baseline.  Psychiatric:        Mood and Affect: Mood normal.        Behavior: Behavior normal.     No results found for any visits on 09/30/20.  Assessment & Plan     Problem List Items Addressed This Visit       Cardiovascular and  Mediastinum   HTN (hypertension) - Primary    Uncontrolled at home, but well controlled her today Reports fluctuations in BP Taking her meds prn - states she was told to in the hospital Will use coreg regularly BID Has f/u in 1 m with Cardiology       Relevant Orders   Comprehensive metabolic panel     Respiratory   OSA on CPAP    Not tolerating pressure from CPAP again Had recent adjustment Will refer to Pulm/sleep medicine for further management       Relevant  Orders   Ambulatory referral to Pulmonology     Nervous and Auditory   Left hemiparesis (HCC)    Chronic and stable H/o stroke       Neuropathy    Ongoing neuropathy  Did not tolerate low dose gabapentin Trial of lyrica         Other   Pure hypercholesterolemia    Previously well controlled Continue statin Repeat FLP and CMP Goal LDL < 70       Relevant Orders   Lipid panel   Comprehensive metabolic panel   Hyperglycemia    Recommend low carb diet Recheck A1c        Relevant Orders   Hemoglobin A1c     Return in about 3 months (around 12/31/2020) for chronic disease f/u.      I,Essence Turner,acting as a Education administrator for Lavon Paganini, MD.,have documented all relevant documentation on the behalf of Lavon Paganini, MD,as directed by  Lavon Paganini, MD while in the presence of Lavon Paganini, MD.  I, Lavon Paganini, MD, have reviewed all documentation for this visit. The documentation on 09/30/20 for the exam, diagnosis, procedures, and orders are all accurate and complete.   Armilda Vanderlinden, [name], MD, MPH [facility]

## 2020-09-30 NOTE — Assessment & Plan Note (Signed)
Recommend low carb diet °Recheck A1c  °

## 2020-09-30 NOTE — Assessment & Plan Note (Signed)
Previously well controlled Continue statin Repeat FLP and CMP Goal LDL < 70 

## 2020-11-09 ENCOUNTER — Other Ambulatory Visit: Payer: Self-pay

## 2020-11-09 ENCOUNTER — Ambulatory Visit: Payer: Federal, State, Local not specified - PPO | Admitting: Adult Health

## 2020-11-09 ENCOUNTER — Encounter: Payer: Self-pay | Admitting: Adult Health

## 2020-11-09 DIAGNOSIS — G4733 Obstructive sleep apnea (adult) (pediatric): Secondary | ICD-10-CM

## 2020-11-09 DIAGNOSIS — Z9989 Dependence on other enabling machines and devices: Secondary | ICD-10-CM

## 2020-11-09 DIAGNOSIS — E669 Obesity, unspecified: Secondary | ICD-10-CM | POA: Insufficient documentation

## 2020-11-09 NOTE — Patient Instructions (Signed)
Continue on CPAP At bedtime   Adjust CPAP pressure to 8-15 cm  CPAP download in 4 weeks  Work on healthy weight  Do not drive if sleepy .  Follow up in 4 months and As needed

## 2020-11-09 NOTE — Assessment & Plan Note (Signed)
Patient is encouraged on healthy weight loss.

## 2020-11-09 NOTE — Assessment & Plan Note (Addendum)
Very severe sleep apnea with reported good control and compliance on CPAP.  CPAP download has been requested.  Patient does feel that her pressure is too high.  We will change her set pressure at 12 cm to AutoSet 8 to 15 cm. Do a CPAP download in 4 weeks.  Plan  Patient Instructions  Continue on CPAP At bedtime   Adjust CPAP pressure to 8-15 cm  CPAP download in 4 weeks  Work on healthy weight  Do not drive if sleepy .  Follow up in 4 months and As needed      Late add CPAP download shows excellent compliance with 100% usage.  Daily average usage at 9 hours.  Patient is on CPAP 12 cm H2O.  AHI is 1.1.  Minimal leaks.

## 2020-11-09 NOTE — Progress Notes (Signed)
$'@Patient'u$  ID: Angel Lane, female    DOB: 1951-02-07, 70 y.o.   MRN: [id number]  Chief Complaint  Patient presents with   Follow-up    Referring provider: Virginia Crews, MD  HPI: 70 year old female presents November 09, 2020 for sleep consult to establish for sleep apnea.  Patient underwent a sleep study Jul 24, 2019 found to have severe obstructive sleep apnea started on CPAP  Medical history of dissecting thoracic aneurysm   TEST/EVENTS :  Sleep study report 07/24/2019 severe sleep apnea AHI 62.1/hour, SPO2 low 80%, worse during REM sleep AHI 96/hour.  Patient was recommended for a CPAP titration study  11/09/2020 Sleep Consult  Patient presents today for a sleep consult.  Patient complains of daytime sleepiness snoring and restless sleep last year.   She was set up for a sleep study last year this was completed Jul 24, 2019 that showed severe sleep apnea with an AHI at 62.1/hour and SPO2 low at 80%, this was worse during REM sleep with AHI at 96/hour.  She was recommended for a CPAP titration study.  This was completed on 1 October 16, 2019 that showed optimal control at CPAP 12 cm H2O.  Patient says that since beginning CPAP she is feeling much better.  Her daytime sleepiness has decreased quite a bit.  She feels that it has really helped her a lot.  However she has noticed that at times her pressure is too much and feels that it is uncomfortable.  Patient says she wears her CPAP every night.  Never misses a night.  Gets on average 8 to 10 hours each night. Epworth score is 8.  Patient typically goes to bed around 10 PM.  Gets up 3-4 times each night.  Gets up around 8 AM.  Weight is up about 20 pounds over the last 2 years.  Drinks 1/2 cup of coffee .  No symptoms of Sleep paralysis or cataplexy .  Patient uses a full facemask.   Married. 2 adult kids, Grandkids 5 . 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  Former smoker . No alcohol .   SH : etopic pregnancy s/p  surgery  Gallbladder surgery .   PMH :  HTN , Aortic dissection, CVA 2018 w/ left sided hemiparesis , Hyperlipidemia ,   Allergies  Allergen Reactions   Penicillins Other (See Comments)    chills    Immunization History  Administered Date(s) Administered   PFIZER Comirnaty(Gray Top)Covid-19 Tri-Sucrose Vaccine 04/17/2020   PFIZER(Purple Top)SARS-COV-2 Vaccination 09/24/2019, 10/15/2019   Pneumococcal Conjugate-13 03/23/2017   Pneumococcal Polysaccharide-23 05/29/2019   Tdap 10/08/2017    Past Medical History:  Diagnosis Date   Acute respiratory failure with hypoxia ([place]) 05/08/2020   Anemia    Depression    Dyspnea    due to stroke   HCAP (healthcare-associated pneumonia) 05/08/2020   Hypertension    Neuromuscular disorder ([place])    left sided weakness from stroke   Sleep apnea    Stroke [facility])    November 2018     Tobacco History: Social History   Tobacco Use  Smoking Status Former   Packs/day: 1.00   Years: 53.00   Pack years: 53.00   Types: Cigarettes   Quit date: 06/04/2020   Years since quitting: 0.4  Smokeless Tobacco Never  Tobacco Comments   Less than 1/2 PPD   Counseling given: Not Answered Tobacco comments: Less than 1/2 PPD   Outpatient Medications Prior to Visit  Medication Sig  Dispense Refill   carvedilol (COREG) 6.25 MG tablet Take 1 tablet (6.25 mg total) by mouth 2 (two) times daily with a meal. 60 tablet 1   hydrochlorothiazide (HYDRODIURIL) 12.5 MG tablet TAKE 1 TABLET BY MOUTH EVERY DAY 90 tablet 1   pregabalin (LYRICA) 75 MG capsule Take 1 capsule (75 mg total) by mouth 2 (two) times daily. 60 capsule 2   rosuvastatin (CRESTOR) 5 MG tablet TAKE 1 TABLET (5 MG TOTAL) BY MOUTH DAILY. 90 tablet 1   acetaminophen (TYLENOL) 500 MG tablet Take 500 mg by mouth every 6 (six) hours as needed for moderate pain. (Patient not taking: Reported on 11/09/2020)     No facility-administered medications prior to visit.     Review of Systems:    Constitutional:   No  weight loss, night sweats,  Fevers, chills,  +fatigue, or  lassitude.  HEENT:   No headaches,  Difficulty swallowing,  Tooth/dental problems, or  Sore throat,                No sneezing, itching, ear ache, nasal congestion, post nasal drip,   CV:  No chest pain,  Orthopnea, PND, swelling in lower extremities, anasarca, dizziness, palpitations, syncope.   GI  No heartburn, indigestion, abdominal pain, nausea, vomiting, diarrhea, change in bowel habits, loss of appetite, bloody stools.   Resp: No shortness of breath with exertion or at rest.  No excess mucus, no productive cough,  No non-productive cough,  No coughing up of blood.  No change in color of mucus.  No wheezing.  No chest wall deformity  Skin: no rash or lesions.  GU: no dysuria, change in color of urine, no urgency or frequency.  No flank pain, no hematuria   MS:  No joint pain or swelling.  No decreased range of motion.  No back pain.    Physical Exam  BP 140/80 (BP Location: Left Arm, Patient Position: Sitting, Cuff Size: Normal)   Pulse 80   Temp 98.2 F (36.8 C) (Oral)   Ht '5\' 5"'$  (1.651 m)   Wt 195 lb 6.4 oz (88.6 kg)   SpO2 95%   BMI 32.52 kg/m   GEN: A/Ox3; pleasant , NAD, well nourished    HEENT:  Carl/AT,  NOSE-clear, THROAT-clear, no lesions, no postnasal drip or exudate noted.  Class II-III MP airway  NECK:  Supple w/ fair ROM; no JVD; normal carotid impulses w/o bruits; no thyromegaly or nodules palpated; no lymphadenopathy.    RESP  Clear  P & A; w/o, wheezes/ rales/ or rhonchi. no accessory muscle use, no dullness to percussion  CARD:  RRR, no m/r/g, TR  peripheral edema, pulses intact, no cyanosis or clubbing.  GI:   Soft & nt; nml bowel sounds; no organomegaly or masses detected.   Musco: Warm bil, no deformities or joint swelling noted.   Neuro: alert, no focal deficits noted.    Skin: Warm, no lesions or rashes    Lab Results:     BNP No results found for:  BNP  ProBNP No results found for: PROBNP  Imaging: No results found.    No flowsheet data found.  No results found for: NITRICOXIDE      Assessment & Plan:   OSA on CPAP Very severe sleep apnea with reported good control and compliance on CPAP.  CPAP download has been requested.  Patient does feel that her pressure is too high.  We will change her set pressure at 12 cm to AutoSet  8 to 15 cm. Do a CPAP download in 4 weeks.  Plan  Patient Instructions  Continue on CPAP At bedtime   Adjust CPAP pressure to 8-15 cm  CPAP download in 4 weeks  Work on healthy weight  Do not drive if sleepy .  Follow up in 4 months and As needed        Morbid obesity [facility]) Patient is encouraged on healthy weight loss.     Rexene Edison, NP 11/09/2020

## 2020-11-10 NOTE — Progress Notes (Signed)
Reviewed and agree with assessment/plan.   Chesley Mires, MD [facility] Pulmonary/Critical Care 11/10/2020, 8:49 AM Pager:  [phone]

## 2020-11-18 ENCOUNTER — Other Ambulatory Visit: Payer: Self-pay

## 2020-11-18 DIAGNOSIS — I71019 Dissection of thoracic aorta, unspecified: Secondary | ICD-10-CM

## 2020-11-18 DIAGNOSIS — I7101 Dissection of thoracic aorta: Secondary | ICD-10-CM

## 2020-11-25 ENCOUNTER — Other Ambulatory Visit: Payer: Self-pay

## 2020-11-25 DIAGNOSIS — I712 Thoracic aortic aneurysm, without rupture, unspecified: Secondary | ICD-10-CM

## 2020-12-19 NOTE — Progress Notes (Signed)
[facility] [place] PROGRESS NOTE  ASSESSMENT / PLAN: Angel Lane is a 70 y.o. 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 Doing well. CT angiogram reviewed in detail with patient. No worrisome features. Follow up with [place] again in 1 year with repeat CTA.   SUBJECTIVE: Multiple varied complaints. Reports fatigue and inability to lay on left side because of mid chest discomfort. I explained these are likely unreleated to remove TEVAR.   OBJECTIVE: BP (!) 143/82 (BP Location: Left Arm, Patient Position: Sitting, Cuff Size: Large)   Pulse 77   Temp 98 F (36.7 C)   Resp 20   Ht 5\' 5"  (1.651 m)   Wt 202 lb (91.6 kg)   SpO2 99%   BMI 33.61 kg/m   Constitutional: well appearing. no acute distress. CNS: 5/5 throughout Neck: incisions well healed Cardiac: RRR. Pulmonary: unlabored Abdomen: soft Vascular: 2+ radial pulses  CBC Latest Ref Rng & Units 06/19/2020 05/31/2020 05/28/2020  WBC 4.0 - 10.5 K/uL 6.3 5.9 7.0  Hemoglobin 12.0 - 15.0 g/dL 12.5 10.4(L) 11.0(L)  Hematocrit 36.0 - 46.0 % 39.5 32.7(L) 34.3(L)  Platelets 150 - 400 K/uL 197 257 277     CMP Latest Ref Rng & Units 12/20/2020 06/19/2020 06/02/2020  Glucose 70 - 99 mg/dL - 128(H) 109(H)  BUN 8 - 23 mg/dL - 14 9  Creatinine 0.44 - 1.00 mg/dL 0.80 0.57 0.59  Sodium 135 - 145 mmol/L - 136 138  Potassium 3.5 - 5.1 mmol/L - 3.2(L) 3.5  Chloride 98 - 111 mmol/L - 100 105  CO2 22 - 32 mmol/L - 25 24  Calcium 8.9 - 10.3 mg/dL - 9.5 9.2  Total Protein 6.5 - 8.1 g/dL - 6.9 -  Total Bilirubin 0.3 - 1.2 mg/dL - 0.5 -  Alkaline Phos 38 - 126 U/L - 63 -  AST 15 - 41 U/L - 34 -  ALT 0 - 44 U/L - 39 -    Estimated Creatinine Clearance: 72.1 mL/min (by C-G formula based on SCr of 0.8 mg/dL).  CT angiogram reviewed in detail.  Bilateral transpositions appear widely patent.  TEVAR appears in good position.  I do not appreciate a type II endoleak as seen in  previous studies.  Yevonne Aline. Stanford Breed, MD [facility] Phone Number: [phone] 12/21/2020 1:41 PM

## 2020-12-20 ENCOUNTER — Ambulatory Visit
Admission: RE | Admit: 2020-12-20 | Discharge: 2020-12-20 | Disposition: A | Discharge status: Home / Self Care | Payer: Federal, State, Local not specified - PPO | Source: Ambulatory Visit | Attending: Vascular Surgery | Admitting: Vascular Surgery

## 2020-12-20 ENCOUNTER — Other Ambulatory Visit: Payer: Self-pay

## 2020-12-20 DIAGNOSIS — I712 Thoracic aortic aneurysm, without rupture, unspecified: Secondary | ICD-10-CM | POA: Diagnosis present

## 2020-12-20 LAB — POCT I-STAT CREATININE: Creatinine, Ser: 0.8 mg/dL (ref 0.44–1.00)

## 2020-12-20 MED ORDER — IOHEXOL 350 MG/ML SOLN
100.0000 mL | Freq: Once | INTRAVENOUS | Status: AC | PRN
  Administered 2020-12-20: 100 mL via INTRAVENOUS

## 2020-12-21 ENCOUNTER — Encounter: Payer: Self-pay | Admitting: Vascular Surgery

## 2020-12-21 ENCOUNTER — Ambulatory Visit: Payer: Federal, State, Local not specified - PPO | Admitting: Vascular Surgery

## 2020-12-21 ENCOUNTER — Ambulatory Visit: Payer: Federal, State, Local not specified - PPO

## 2020-12-21 ENCOUNTER — Other Ambulatory Visit: Payer: Self-pay

## 2020-12-21 VITALS — BP 143/82 | HR 77 | Temp 98.0°F | Resp 20 | Ht 65.0 in | Wt 202.0 lb

## 2020-12-21 DIAGNOSIS — Z95828 Presence of other vascular implants and grafts: Secondary | ICD-10-CM

## 2021-01-03 ENCOUNTER — Other Ambulatory Visit: Payer: Self-pay

## 2021-01-03 ENCOUNTER — Ambulatory Visit: Payer: Federal, State, Local not specified - PPO | Admitting: Family Medicine

## 2021-01-03 ENCOUNTER — Encounter: Payer: Self-pay | Admitting: Family Medicine

## 2021-01-03 VITALS — BP 116/75 | HR 85 | Temp 97.5°F | Ht 65.0 in | Wt 202.6 lb

## 2021-01-03 DIAGNOSIS — Z6833 Body mass index (BMI) 33.0-33.9, adult: Secondary | ICD-10-CM

## 2021-01-03 DIAGNOSIS — E7800 Pure hypercholesterolemia, unspecified: Secondary | ICD-10-CM | POA: Diagnosis not present

## 2021-01-03 DIAGNOSIS — I10 Essential (primary) hypertension: Secondary | ICD-10-CM

## 2021-01-03 DIAGNOSIS — M25552 Pain in left hip: Secondary | ICD-10-CM

## 2021-01-03 DIAGNOSIS — R739 Hyperglycemia, unspecified: Secondary | ICD-10-CM | POA: Diagnosis not present

## 2021-01-03 DIAGNOSIS — E669 Obesity, unspecified: Secondary | ICD-10-CM

## 2021-01-03 DIAGNOSIS — R0609 Other forms of dyspnea: Secondary | ICD-10-CM

## 2021-01-03 LAB — POCT GLYCOSYLATED HEMOGLOBIN (HGB A1C): Hemoglobin A1C: 5.5 % (ref 4.0–5.6)

## 2021-01-03 NOTE — Assessment & Plan Note (Signed)
Continue low carb diet Normal A1c today

## 2021-01-03 NOTE — Patient Instructions (Signed)
Call and schedule your mammogram

## 2021-01-03 NOTE — Assessment & Plan Note (Signed)
Previously well controlled Continue statin Repeat FLP and CMP Goal LDL < 70 

## 2021-01-03 NOTE — Assessment & Plan Note (Signed)
Persistent Concerning for OA Referral to Ortho for further eval and management

## 2021-01-03 NOTE — Assessment & Plan Note (Signed)
Well controlled Continue current medications Recheck metabolic panel F/u in 6 months  

## 2021-01-03 NOTE — Assessment & Plan Note (Signed)
Discussed importance of healthy weight management Discussed diet and exercise  Referral to Healthy weight and wellness

## 2021-01-03 NOTE — Assessment & Plan Note (Signed)
F/b cards Upcoming stress test If stress test normal, may need conditioning

## 2021-01-03 NOTE — Progress Notes (Signed)
Established patient visit   Patient: Angel Lane   DOB: 05/02/1950   70 y.o. Female  MRN: [id number] Visit Date: 01/03/2021  Today's healthcare provider: Lavon Paganini, MD   Chief Complaint  Patient presents with   Diabetes   Hypertension   Hyperlipidemia   Subjective    HPI  Diabetes Mellitus Type II, follow-up  Lab Results  Component Value Date   HGBA1C 5.5 01/03/2021   HGBA1C 5.6 02/19/2020   HGBA1C 5.5 07/02/2019   Last seen for diabetes 3 months ago.  Management since then includes low carb diet She reports poor compliance with treatment. She is not having side effects.   Home blood sugar records:  none  Episodes of hypoglycemia? No      Current insulin regiment: none Most Recent Eye Exam: Time to update  --------------------------------------------------------------------------------------------------- Hypertension, follow-up  BP Readings from Last 3 Encounters:  01/03/21 116/75  12/21/20 (!) 143/82  11/09/20 140/80   Wt Readings from Last 3 Encounters:  01/03/21 202 lb 9.6 oz (91.9 kg)  12/21/20 202 lb (91.6 kg)  11/09/20 195 lb 6.4 oz (88.6 kg)     She was last seen for hypertension 3 months ago.  BP at that visit was 122/75. Management since that visit includes Coreg regularly BID. She reports good compliance with treatment. She is not having side effects.  She is exercising. She is not adherent to low salt diet.   Outside blood pressures are none She does not smoke.  Use of agents associated with hypertension: none.   --------------------------------------------------------------------------------------------------- Lipid/Cholesterol, follow-up  Last Lipid Panel: Lab Results  Component Value Date   CHOL 170 02/19/2020   LDLCALC 105 (H) 02/19/2020   HDL 51 02/19/2020   TRIG 62 05/06/2020    She was last seen for this 3 months ago.  Management since that visit includes continue statin.  She reports good compliance  with treatment. She is not having side effects.   Symptoms: Yes appetite changes No foot ulcerations  No chest pain No chest pressure/discomfort  Yes dyspnea No orthopnea  Yes fatigue No lower extremity edema  No palpitations No paroxysmal nocturnal dyspnea  No nausea No numbness or tingling of extremity  No polydipsia No polyuria  No speech difficulty No syncope   She is following a Regular diet. Current exercise: bicycling and walking  Last metabolic panel Lab Results  Component Value Date   GLUCOSE 128 (H) 06/19/2020   NA 136 06/19/2020   K 3.2 (L) 06/19/2020   BUN 14 06/19/2020   CREATININE 0.80 12/20/2020   GFRNONAA >60 06/19/2020   CALCIUM 9.5 06/19/2020   AST 34 06/19/2020   ALT 39 06/19/2020   The ASCVD Risk score (Arnett DK, et al., 2019) failed to calculate for the following reasons:   The patient has a prior MI or stroke diagnosis   L hip pain x several months. Worse with turning.  SOB with exertion x several months. Upcoming visit with Cardiology. Had stress test. ---------------------------------------------------------------------------------------------------     Medications: Outpatient Medications Prior to Visit  Medication Sig   acetaminophen (TYLENOL) 500 MG tablet Take 500 mg by mouth every 6 (six) hours as needed for moderate pain.   carvedilol (COREG) 6.25 MG tablet Take 1 tablet (6.25 mg total) by mouth 2 (two) times daily with a meal.   hydrochlorothiazide (HYDRODIURIL) 25 MG tablet Take 25 mg by mouth daily.   rosuvastatin (CRESTOR) 5 MG tablet TAKE 1 TABLET (5 MG TOTAL) BY  MOUTH DAILY.   pregabalin (LYRICA) 75 MG capsule Take 1 capsule (75 mg total) by mouth 2 (two) times daily. (Patient not taking: No sig reported)   No facility-administered medications prior to visit.    Review of Systems per HPI     Objective    BP 116/75   Pulse 85   Temp (!) 97.5 F (36.4 C) (Temporal)   Ht 5\' 5"  (1.651 m)   Wt 202 lb 9.6 oz (91.9 kg)   BMI  33.71 kg/m  {Show previous vital signs (optional):23777}  Physical Exam Vitals reviewed.  Constitutional:      General: She is not in acute distress.    Appearance: Normal appearance. She is well-developed. She is not diaphoretic.  HENT:     Head: Normocephalic and atraumatic.  Eyes:     General: No scleral icterus.    Conjunctiva/sclera: Conjunctivae normal.  Neck:     Thyroid: No thyromegaly.  Cardiovascular:     Rate and Rhythm: Normal rate and regular rhythm.     Pulses: Normal pulses.     Heart sounds: Normal heart sounds. No murmur heard. Pulmonary:     Effort: Pulmonary effort is normal. No respiratory distress.     Breath sounds: Normal breath sounds. No wheezing, rhonchi or rales.  Musculoskeletal:     Cervical back: Neck supple.     Right lower leg: No edema.     Left lower leg: No edema.  Lymphadenopathy:     Cervical: No cervical adenopathy.  Skin:    General: Skin is warm and dry.     Findings: No rash.  Neurological:     Mental Status: She is alert and oriented to person, place, and time. Mental status is at baseline.     Motor: Weakness (L sided) present.  Psychiatric:        Mood and Affect: Mood normal.        Behavior: Behavior normal.      Results for orders placed or performed in visit on 01/03/21  POCT HgB A1C  Result Value Ref Range   Hemoglobin A1C 5.5 4.0 - 5.6 %    Assessment & Plan     Problem List Items Addressed This Visit       Cardiovascular and Mediastinum   HTN (hypertension)    Well controlled Continue current medications Recheck metabolic panel F/u in 6 months       Relevant Orders   Comprehensive metabolic panel     Other   Pure hypercholesterolemia    Previously well controlled Continue statin Repeat FLP and CMP Goal LDL < 70      Relevant Orders   Comprehensive metabolic panel   Lipid panel   Hyperglycemia - Primary    Continue low carb diet Normal A1c today      Relevant Orders   POCT HgB A1C  (Completed)   DOE (dyspnea on exertion)    F/b cards Upcoming stress test If stress test normal, may need conditioning      Left hip pain    Persistent Concerning for OA Referral to Ortho for further eval and management      Relevant Orders   Ambulatory referral to Orthopedic Surgery   Obesity    Discussed importance of healthy weight management Discussed diet and exercise  Referral to Healthy weight and wellness      Relevant Orders   Amb Ref to Medical Weight Management     Return in about 6 months (around 07/03/2021)  for CPE, AWV.      I, Lavon Paganini, MD, have reviewed all documentation for this visit. The documentation on 01/03/21 for the exam, diagnosis, procedures, and orders are all accurate and complete.   Jeremia Groot, [name], MD, MPH [facility]

## 2021-01-12 ENCOUNTER — Telehealth: Payer: Self-pay

## 2021-01-12 NOTE — Telephone Encounter (Signed)
Copied from [place] [id number]. Topic: General - Call Back - No Documentation >> Jan 12, 2021 12:42 PM Erick Blinks wrote: Reason for CRM: Wants call back to confirm if fax is received. Missing signed order to bill insurance.   Best contact: [phone] Moshe Cipro calling from [place], provides the patient's oxygen)

## 2021-01-13 NOTE — Telephone Encounter (Signed)
Form was faxed on 01/06/21 re-faxed this morning. Moshe Cipro confirmed receipt of fax. sd

## 2021-03-25 ENCOUNTER — Other Ambulatory Visit: Payer: Self-pay | Admitting: Specialist

## 2021-03-25 DIAGNOSIS — R911 Solitary pulmonary nodule: Secondary | ICD-10-CM

## 2021-03-25 DIAGNOSIS — R0602 Shortness of breath: Secondary | ICD-10-CM

## 2021-05-19 ENCOUNTER — Encounter: Payer: Self-pay | Admitting: Family Medicine

## 2021-06-06 ENCOUNTER — Ambulatory Visit
Admission: RE | Admit: 2021-06-06 | Discharge: 2021-06-06 | Disposition: A | Discharge status: Home / Self Care | Payer: Federal, State, Local not specified - PPO | Source: Ambulatory Visit | Attending: Specialist | Admitting: Specialist

## 2021-06-06 DIAGNOSIS — R0602 Shortness of breath: Secondary | ICD-10-CM | POA: Diagnosis present

## 2021-06-06 DIAGNOSIS — R911 Solitary pulmonary nodule: Secondary | ICD-10-CM | POA: Diagnosis present

## 2021-07-04 ENCOUNTER — Ambulatory Visit (INDEPENDENT_AMBULATORY_CARE_PROVIDER_SITE_OTHER): Payer: Federal, State, Local not specified - PPO | Admitting: Family Medicine

## 2021-07-04 ENCOUNTER — Encounter: Payer: Self-pay | Admitting: Family Medicine

## 2021-07-04 VITALS — BP 130/81 | HR 80 | Temp 98.2°F | Resp 16 | Ht 65.0 in | Wt 215.9 lb

## 2021-07-04 DIAGNOSIS — E7800 Pure hypercholesterolemia, unspecified: Secondary | ICD-10-CM | POA: Diagnosis not present

## 2021-07-04 DIAGNOSIS — G8929 Other chronic pain: Secondary | ICD-10-CM

## 2021-07-04 DIAGNOSIS — M545 Low back pain, unspecified: Secondary | ICD-10-CM

## 2021-07-04 DIAGNOSIS — M940 Chondrocostal junction syndrome [Tietze]: Secondary | ICD-10-CM

## 2021-07-04 DIAGNOSIS — I25118 Atherosclerotic heart disease of native coronary artery with other forms of angina pectoris: Secondary | ICD-10-CM

## 2021-07-04 DIAGNOSIS — Z0000 Encounter for general adult medical examination without abnormal findings: Secondary | ICD-10-CM

## 2021-07-04 DIAGNOSIS — G8194 Hemiplegia, unspecified affecting left nondominant side: Secondary | ICD-10-CM

## 2021-07-04 DIAGNOSIS — R739 Hyperglycemia, unspecified: Secondary | ICD-10-CM

## 2021-07-04 DIAGNOSIS — I10 Essential (primary) hypertension: Secondary | ICD-10-CM

## 2021-07-04 DIAGNOSIS — I71019 Dissection of thoracic aorta, unspecified: Secondary | ICD-10-CM

## 2021-07-04 DIAGNOSIS — Z1231 Encounter for screening mammogram for malignant neoplasm of breast: Secondary | ICD-10-CM | POA: Diagnosis not present

## 2021-07-04 DIAGNOSIS — E559 Vitamin D deficiency, unspecified: Secondary | ICD-10-CM

## 2021-07-04 NOTE — Assessment & Plan Note (Signed)
S/p VVS repair ?Encouraged patient to continue to follow with vascular surgery and get her routine imaging to continue to monitor ?

## 2021-07-04 NOTE — Assessment & Plan Note (Signed)
Chronic and stable ?Her chest discomfort today is related to costochondritis as below and not angina ?Continue to follow with cardiology ?

## 2021-07-04 NOTE — Assessment & Plan Note (Signed)
Chronic and stable ?History of stroke ?Likely contributes to her left lower back pain ?

## 2021-07-04 NOTE — Assessment & Plan Note (Signed)
Well controlled ?Continue current medications-discussed importance of taking her HCTZ every day and not holding when her blood pressure is normal ?Recheck metabolic panel ?F/u in 6 months  ?

## 2021-07-04 NOTE — Assessment & Plan Note (Signed)
New problem ?Pain is reproducible on exam with palpation of the costochondral joints ?Not a good candidate for NSAIDs ?Continue Tylenol arthritis ?Ice as needed ?Avoid movement that causes more pain ?

## 2021-07-04 NOTE — Assessment & Plan Note (Signed)
Previously well controlled Continue statin Repeat FLP and CMP Goal LDL < 70 

## 2021-07-04 NOTE — Patient Instructions (Signed)
   The CDC recommends two doses of Shingrix (the shingles vaccine) separated by 2 to 6 months for adults age 72 years and older. I recommend checking with your insurance plan regarding coverage for this vaccine.   

## 2021-07-04 NOTE — Assessment & Plan Note (Signed)
Discussed importance of healthy weight management Discussed diet and exercise  

## 2021-07-04 NOTE — Progress Notes (Signed)
? ? ?I,Joseline E Rosas,acting as a scribe for Lavon Paganini, MD.,have documented all relevant documentation on the behalf of Lavon Paganini, MD,as directed by  Lavon Paganini, MD while in the presence of Lavon Paganini, MD.  ? ?Complete physical exam ? ? ?Patient: Angel Lane   DOB: Nov 23, 1950   71 y.o. Female  MRN: [id number] ?Visit Date: 07/04/2021 ? ?Today's healthcare provider: Lavon Paganini, MD  ? ?Chief Complaint  ?Patient presents with  ? Annual Exam  ? ?Subjective  ?  ?Angel Lane is a 71 y.o. female who presents today for a complete physical exam.  ?She reports consuming a general diet. Home exercise routine includes walking. She generally feels fairly well. She reports sleeping fairly well. She does not have additional problems to discuss today.  ? ?Chest discomfort for the past 2 weeks. She was seen by Cardio 06/22/2021.  Reproducible with palpation.  Non-exertional.  Intermittent. Worse with laying on back or palpation of L side of sternum.  Never had it previously. Started after riding lawnmower. ? ?Sharp pain in L lower back intermittently "forever" x at least 6 months.  Occasional aching in L leg.  Feels like stabbing. Tylenol arthritis and wearing supportive shoes helps. Worse with exertion. ? ?HPI  ?Colonoscopy:01/21/2020-Repeat 2031 ? ?Past Medical History:  ?Diagnosis Date  ? Acute respiratory failure with hypoxia ([place]) 05/08/2020  ? Anemia   ? Depression   ? Dyspnea   ? due to stroke  ? Ectopic pregnancy 07/19/2020  ? HCAP (healthcare-associated pneumonia) 05/08/2020  ? Hypertension   ? Neuromuscular disorder ([place])   ? 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 Sleep apnea   ? Stroke [facility])   ? November 2018   ? ?Past Surgical History:  ?Procedure Laterality Date  ? ABDOMINAL SURGERY    ? CARDIAC CATHETERIZATION    ? CAROTID-SUBCLAVIAN BYPASS GRAFT Bilateral 05/14/2020  ? Procedure: BILATERAL CAROTID-SUBCLAVIAN  TRANSPOSITION;  Surgeon: Elam Dutch, MD;  Location: [facility] OR;   Service: Vascular;  Laterality: Bilateral;  ? CATARACT EXTRACTION W/PHACO Left 05/16/2017  ? Procedure: CATARACT EXTRACTION PHACO AND INTRAOCULAR LENS PLACEMENT ([place]) LEFT;  Surgeon: Leandrew Koyanagi, MD;  Location: [facility];  Service: Ophthalmology;  Laterality: Left;  ? CATARACT EXTRACTION W/PHACO Right 06/13/2017  ? Procedure: CATARACT EXTRACTION PHACO AND INTRAOCULAR LENS PLACEMENT ([place]) RIGHT;  Surgeon: Leandrew Koyanagi, MD;  Location: [facility];  Service: Ophthalmology;  Laterality: Right;  sleep apnea  ? CHOLECYSTECTOMY    ? COLONOSCOPY N/A 01/21/2020  ? Procedure: COLONOSCOPY;  Surgeon: Toledo, Benay Pike, MD;  Location: [facility] ENDOSCOPY;  Service: Gastroenterology;  Laterality: N/A;  ? THORACIC AORTIC ENDOVASCULAR STENT GRAFT N/A 05/14/2020  ? Procedure: THORACIC AORTIC ENDOVASCULAR STENT USING GORE TAG 21m X 15cm GRAFT AND GORE TAG 35mX 20cm GRAFT;  Surgeon: FiElam DutchMD;  Location[facility] Service: Vascular;  Laterality: N/A;  ? ULTRASOUND GUIDANCE FOR VASCULAR ACCESS Bilateral 05/14/2020  ? Procedure: ULTRASOUND GUIDANCE FOR VASCULAR ACCESS;  Surgeon: FiElam DutchMD;  [place] [facility] Service: Vascular;  Laterality: Bilateral;  ? ?Social History  ? ?Socioeconomic History  ? Marital status: Married  ?  Spouse name: Not on file  ? Number of children: Not on file  ? Years of education: Not on file  ? Highest education level: Not on file  ?Occupational History  ? Not on file  ?Tobacco Use  ? Smoking status: Former  ?  Packs/day: 1.00  ?  Years: 53.00  ?  Pack years: 53.00  ?  Types: Cigarettes  ?  Quit date: 06/04/2020  ?  Years since quitting: 1.0  ? Smokeless tobacco: Never  ? Tobacco comments:  ?  Less than 1/2 PPD  ?Vaping Use  ? Vaping Use: Former  ?Substance and Sexual Activity  ? Alcohol use: No  ? Drug use: No  ? Sexual activity: Yes  ?Other Topics Concern  ? Not on file  ?Social History Narrative  ? Not on file  ? ?Social Determinants of Health  ? ?Financial  Resource Strain: Not on file  ?Food Insecurity: Not on file  ?Transportation Needs: Not on file  ?Physical Activity: Not on file  ?Stress: Not on file  ?Social Connections: Not on file  ?Intimate Partner Violence: Not on file  ? ?Family Status  ?Relation Name Status  ? Mother  Deceased  ? Father  Deceased  ? Mat Aunt  (Not Specified)  ? Cousin  (Not Specified)  ? ?Family History  ?Problem Relation Age of Onset  ? Asthma Mother   ? Heart failure Mother   ? Hypertension Mother   ? Heart attack Father   ? Breast cancer Maternal Aunt   ? Breast cancer Cousin   ?     maternal 1st cousin  ? ?Allergies  ?Allergen Reactions  ? Penicillins Other (See Comments)  ?  chills  ?  ?Patient Care Team: ?Virginia Crews, MD as PCP - General (Family Medicine) ?Skeet Latch, MD as PCP - Cardiology (Cardiology)  ? ?Medications: ?Outpatient Medications Prior to Visit  ?Medication Sig  ? acetaminophen (TYLENOL) 500 MG tablet Take 500 mg by mouth every 6 (six) hours as needed for moderate pain.  ? carvedilol (COREG) 6.25 MG tablet Take 1 tablet (6.25 mg total) by mouth 2 (two) times daily with a meal.  ? hydrochlorothiazide (HYDRODIURIL) 25 MG tablet Take 25 mg by mouth daily.  ? rosuvastatin (CRESTOR) 5 MG tablet TAKE 1 TABLET (5 MG TOTAL) BY MOUTH DAILY.  ? pregabalin (LYRICA) 75 MG capsule Take 1 capsule (75 mg total) by mouth 2 (two) times daily. (Patient not taking: Reported on 12/21/2020)  ? ?No facility-administered medications prior to visit.  ? ? ?Review of Systems  ?Constitutional:  Positive for fatigue.  ?HENT:  Positive for hearing loss.   ?Eyes: Negative.   ?Respiratory:  Positive for apnea, chest tightness and shortness of breath.   ?Cardiovascular:  Positive for chest pain ("for the past 2 weeks" went to see Cardio about this.) and palpitations.  ?Gastrointestinal:  Positive for abdominal distention and constipation.  ?Endocrine: Positive for heat intolerance and polyphagia.  ?Genitourinary:  Positive for enuresis.   ?Musculoskeletal:  Positive for arthralgias and back pain.  ?Skin: Negative.   ?Allergic/Immunologic: Positive for environmental allergies.  ?Neurological: Negative.   ?Hematological: Negative.   ?Psychiatric/Behavioral: Negative.    ? ? ? Objective  ? ?  ?BP 130/81 (BP Location: Left Arm, Patient Position: Sitting, Cuff Size: Large)   Pulse 80   Temp 98.2 ?F (36.8 ?C) (Oral)   Resp 16   Ht '5\' 5"'$  (1.651 m)   Wt 215 lb 14.4 oz (97.9 kg)   BMI 35.93 kg/m?  ? ? ? ?Physical Exam ?Vitals reviewed.  ?Constitutional:   ?   General: She is not in acute distress. ?   Appearance: Normal appearance. She is well-developed. She is not diaphoretic.  ?HENT:  ?   Head: Normocephalic and atraumatic.  ?   Right Ear: Tympanic membrane, ear  canal and external ear normal.  ?   Left Ear: Tympanic membrane, ear canal and external ear normal.  ?   Nose: Nose normal.  ?   Mouth/Throat:  ?   Mouth: Mucous membranes are moist.  ?   Pharynx: Oropharynx is clear. No oropharyngeal exudate.  ?Eyes:  ?   General: No scleral icterus. ?   Conjunctiva/sclera: Conjunctivae normal.  ?   Pupils: Pupils are equal, round, and reactive to light.  ?Neck:  ?   Thyroid: No thyromegaly.  ?Cardiovascular:  ?   Rate and Rhythm: Normal rate and regular rhythm.  ?   Heart sounds: Normal heart sounds. No murmur heard. ?Pulmonary:  ?   Effort: Pulmonary effort is normal. No respiratory distress.  ?   Breath sounds: Normal breath sounds. No wheezing or rales.  ?Chest:  ?   Comments: Tenderness to palpation over left costochondral joints ?Abdominal:  ?   General: There is no distension.  ?   Palpations: Abdomen is soft.  ?   Tenderness: There is no abdominal tenderness.  ?Musculoskeletal:  ?   Cervical back: Neck supple.  ?   Right lower leg: No edema.  ?   Left lower leg: No edema.  ?Lymphadenopathy:  ?   Cervical: No cervical adenopathy.  ?Skin: ?   General: Skin is warm and dry.  ?Neurological:  ?   Mental Status: She is alert and oriented to person, place,  and time. Mental status is at baseline.  ?   Sensory: No sensory deficit.  ?   Motor: Weakness (L leg) present.  ?   Gait: Gait abnormal.  ?Psychiatric:     ?   Mood and Affect: Mood normal.     ?   Behavior: Beh

## 2021-07-04 NOTE — Assessment & Plan Note (Signed)
Chronic and intermittent issue ?Seems to be related to her left leg weakness and therefore atypical gait ?Recommend physical therapy, but patient states she has done a lot of this since her stroke ?Since Tylenol arthritis and wearing supportive shoes is helpful, I did encourage her to continue this ?No need for imaging at this time ?

## 2021-07-04 NOTE — Assessment & Plan Note (Signed)
Recommend low-carb diet ?Recheck A1c ?Last known normal ?

## 2021-07-05 LAB — COMPREHENSIVE METABOLIC PANEL
ALT: 23 IU/L (ref 0–32)
AST: 21 IU/L (ref 0–40)
Albumin/Globulin Ratio: 1.4 (ref 1.2–2.2)
Albumin: 3.9 g/dL (ref 3.7–4.7)
Alkaline Phosphatase: 82 IU/L (ref 44–121)
BUN/Creatinine Ratio: 15 (ref 12–28)
BUN: 13 mg/dL (ref 8–27)
Bilirubin Total: 0.6 mg/dL (ref 0.0–1.2)
CO2: 27 mmol/L (ref 20–29)
Calcium: 9.7 mg/dL (ref 8.7–10.3)
Chloride: 105 mmol/L (ref 96–106)
Creatinine, Ser: 0.86 mg/dL (ref 0.57–1.00)
Globulin, Total: 2.8 g/dL (ref 1.5–4.5)
Glucose: 77 mg/dL (ref 70–99)
Potassium: 4.4 mmol/L (ref 3.5–5.2)
Sodium: 145 mmol/L — ABNORMAL HIGH (ref 134–144)
Total Protein: 6.7 g/dL (ref 6.0–8.5)
eGFR: 72 mL/min/{1.73_m2} (ref >59)

## 2021-07-05 LAB — LIPID PANEL
Chol/HDL Ratio: 3.1 ratio (ref 0.0–4.4)
Cholesterol, Total: 165 mg/dL (ref 100–199)
HDL: 53 mg/dL (ref >39)
LDL Chol Calc (NIH): 95 mg/dL (ref 0–99)
Triglycerides: 94 mg/dL (ref 0–149)
VLDL Cholesterol Cal: 17 mg/dL (ref 5–40)

## 2021-07-05 LAB — HEMOGLOBIN A1C
Est. average glucose Bld gHb Est-mCnc: 120 mg/dL
Hgb A1c MFr Bld: 5.8 % — ABNORMAL HIGH (ref 4.8–5.6)

## 2021-07-05 LAB — VITAMIN D 25 HYDROXY (VIT D DEFICIENCY, FRACTURES): Vit D, 25-Hydroxy: 28 ng/mL — ABNORMAL LOW (ref 30.0–100.0)

## 2021-07-27 ENCOUNTER — Emergency Department: Payer: Federal, State, Local not specified - PPO

## 2021-07-27 ENCOUNTER — Encounter: Payer: Self-pay | Admitting: Family Medicine

## 2021-07-27 ENCOUNTER — Emergency Department
Admission: EM | Admit: 2021-07-27 | Discharge: 2021-07-27 | Disposition: A | Discharge status: Home / Self Care | Payer: Federal, State, Local not specified - PPO | Attending: Emergency Medicine | Admitting: Emergency Medicine

## 2021-07-27 ENCOUNTER — Other Ambulatory Visit: Payer: Self-pay

## 2021-07-27 DIAGNOSIS — R109 Unspecified abdominal pain: Secondary | ICD-10-CM | POA: Diagnosis present

## 2021-07-27 DIAGNOSIS — N200 Calculus of kidney: Secondary | ICD-10-CM

## 2021-07-27 DIAGNOSIS — N132 Hydronephrosis with renal and ureteral calculous obstruction: Secondary | ICD-10-CM | POA: Insufficient documentation

## 2021-07-27 LAB — BASIC METABOLIC PANEL
Anion gap: 9 (ref 5–15)
BUN: 26 mg/dL — ABNORMAL HIGH (ref 8–23)
CO2: 27 mmol/L (ref 22–32)
Calcium: 9.4 mg/dL (ref 8.9–10.3)
Chloride: 101 mmol/L (ref 98–111)
Creatinine, Ser: 1.28 mg/dL — ABNORMAL HIGH (ref 0.44–1.00)
GFR, Estimated: 45 mL/min — ABNORMAL LOW (ref >60)
Glucose, Bld: 103 mg/dL — ABNORMAL HIGH (ref 70–99)
Potassium: 3.9 mmol/L (ref 3.5–5.1)
Sodium: 137 mmol/L (ref 135–145)

## 2021-07-27 LAB — URINALYSIS, ROUTINE W REFLEX MICROSCOPIC
Bilirubin Urine: NEGATIVE
Glucose, UA: NEGATIVE mg/dL
Ketones, ur: NEGATIVE mg/dL
Leukocytes,Ua: NEGATIVE
Nitrite: NEGATIVE
Protein, ur: NEGATIVE mg/dL
RBC / HPF: >50 RBC/hpf — ABNORMAL HIGH (ref 0–5)
Specific Gravity, Urine: 1.017 (ref 1.005–1.030)
pH: 6 (ref 5.0–8.0)

## 2021-07-27 LAB — CBC
HCT: 41.7 % (ref 36.0–46.0)
Hemoglobin: 13.3 g/dL (ref 12.0–15.0)
MCH: 29.2 pg (ref 26.0–34.0)
MCHC: 31.9 g/dL (ref 30.0–36.0)
MCV: 91.4 fL (ref 80.0–100.0)
Platelets: 199 10*3/uL (ref 150–400)
RBC: 4.56 MIL/uL (ref 3.87–5.11)
RDW: 12.8 % (ref 11.5–15.5)
WBC: 8.8 10*3/uL (ref 4.0–10.5)
nRBC: 0 % (ref 0.0–0.2)

## 2021-07-27 MED ORDER — OXYCODONE-ACETAMINOPHEN 5-325 MG PO TABS
1.0000 | ORAL_TABLET | Freq: Once | ORAL | Status: AC
  Administered 2021-07-27: 1 via ORAL
  Filled 2021-07-27: qty 1

## 2021-07-27 MED ORDER — IOHEXOL 350 MG/ML SOLN
75.0000 mL | Freq: Once | INTRAVENOUS | Status: AC | PRN
Start: 2021-07-27 — End: 2021-07-27
  Administered 2021-07-27: 75 mL via INTRAVENOUS

## 2021-07-27 MED ORDER — CARVEDILOL 6.25 MG PO TABS: 6.2500 mg | ORAL_TABLET | Freq: Two times a day (BID) | ORAL | 3 refills | Status: DC

## 2021-07-27 MED ORDER — TAMSULOSIN HCL 0.4 MG PO CAPS: 0.4000 mg | ORAL_CAPSULE | Freq: Every day | ORAL | 0 refills | Status: DC

## 2021-07-27 MED ORDER — OXYCODONE-ACETAMINOPHEN 5-325 MG PO TABS
1.0000 | ORAL_TABLET | Freq: Four times a day (QID) | ORAL | 0 refills | Status: DC | PRN
Start: 2021-07-27 — End: 2021-08-02

## 2021-07-27 MED ORDER — TAMSULOSIN HCL 0.4 MG PO CAPS
0.4000 mg | ORAL_CAPSULE | Freq: Once | ORAL | Status: AC
  Administered 2021-07-27: 0.4 mg via ORAL
  Filled 2021-07-27: qty 1

## 2021-07-27 NOTE — ED Provider Triage Note (Signed)
Emergency Medicine Provider Triage Evaluation Note  Angel Lane , a 71 y.o. female  was evaluated in triage.  Pt complains of left flank pain since this afternoon. Also has decreased urination, but no pain or blood. No history of kidney stone. No fever, nausea, or vomiting.  Review of Systems  Positive: Left flank pain Negative: Fever  Physical Exam  There were no vitals taken for this visit. Gen:   Awake, no distress   Resp:  Normal effort  MSK:   Moves extremities without difficulty  Other:    Medical Decision Making  Medically screening exam initiated at 2:58 PM.  Appropriate orders placed.  JANISHA BUESO was informed that the remainder of the evaluation will be completed by another provider, this initial triage assessment does not replace that evaluation, and the importance of remaining in the ED until their evaluation is complete.   Victorino Dike, FNP 07/27/21 1501

## 2021-07-27 NOTE — ED Triage Notes (Signed)
Pt comes with c/o left sided flank pain that started today. Pt states no N/V. Pt states little output when she goes to urinate.

## 2021-07-27 NOTE — ED Notes (Addendum)
Pt to ED for flank pain that started today, pt denies N/V/D. Denies urinary symptoms, denies hx of kidney stones. Pt states he had generalized abdominal pain last night.   Pt is A&Ox4.

## 2021-07-27 NOTE — ED Notes (Signed)
Discharge instructions including follow up care and prescription discussed with pt. Pt verbalized understanding with no questions at this time. Pt to go home with family at bedside.

## 2021-07-27 NOTE — ED Provider Notes (Signed)
[facility] Provider Note  Patient Contact: 5:42 PM (approximate)   History   Flank Pain   HPI  Angel Lane is a 71 y.o. female who presents the emergency department complaining of sudden onset of left flank pain.  Patient states that symptoms began today.  She states she has had no nausea, vomiting, diarrhea, constipation.  She feels like she is urinating less than normal but does not have any dysuria and no noticeable hematuria.  No history of nephrolithiasis.  Patient denies any abdominal pain with this.  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  The symptoms do not feel consistent with her previous dissection.  Patient denies any chest pain, shortness of breath.  No URI symptoms.  No cough.     Physical Exam   Triage Vital Signs: ED Triage Vitals  Enc Vitals Group     BP 07/27/21 1504 140/82     Pulse Rate 07/27/21 1504 86     Resp 07/27/21 1504 18     Temp 07/27/21 1504 98 F (36.7 C)     Temp src --      SpO2 07/27/21 1504 96 %     Weight --      Height --      Head Circumference --      Peak Flow --      Pain Score 07/27/21 1501 10     Pain Loc --      Pain Edu? --      Excl. in [place]? --     Most recent vital signs: Vitals:   07/27/21 1504 07/27/21 1728  BP: 140/82 (!) 154/93  Pulse: 86 86  Resp: 18 20  Temp: 98 F (36.7 C)   SpO2: 96% 98%     General: Alert and in no acute distress.  Neck: No stridor. No cervical spine tenderness to palpation  Cardiovascular:  Good peripheral perfusion.  No appreciable murmurs, rubs, gallops. Respiratory: Normal respiratory effort without tachypnea or retractions. Lungs CTAB. Good air entry to the bases with no decreased or absent breath sounds Gastrointestinal: Bowel sounds 4 quadrants. Soft and nontender to palpation. No guarding or rigidity. No palpable masses. No distention.  Left-sided CVA tenderness. Musculoskeletal: Full  range of motion to all extremities.  Neurologic:  No gross focal neurologic deficits are appreciated.  Skin:   No rash noted Other:   ED Results / Procedures / Treatments   Labs (all labs ordered are listed, but only abnormal results are displayed) Labs Reviewed  URINALYSIS, ROUTINE W REFLEX MICROSCOPIC - Abnormal; Notable for the following components:      Result Value   Color, Urine YELLOW (*)    APPearance HAZY (*)    Hgb urine dipstick LARGE (*)    RBC / HPF >50 (*)    Bacteria, UA RARE (*)    All other components within normal limits  BASIC METABOLIC PANEL - Abnormal; Notable for the following components:   Glucose, Bld 103 (*)    BUN 26 (*)    Creatinine, Ser 1.28 (*)    GFR, Estimated 45 (*)    All other components within normal limits  CBC     EKG     RADIOLOGY  I personally viewed, evaluated, and interpreted these images as part of my medical decision making, as well as reviewing the written report by the radiologist.  ED Provider Interpretation: Findings consistent with  ureterolithiasis over on the left side.  Patient does have graft in place from previous dissection surgery with no concerning findings surrounding aorta or graft.  CT Angio Chest/Abd/Pel for Dissection W and/or Wo Contrast  Result Date: 07/27/2021 CLINICAL DATA:  Thoracic aortic dissection, status post repair. Flank pain. EXAM: CT ANGIOGRAPHY CHEST, ABDOMEN AND PELVIS TECHNIQUE: Non-contrast CT of the chest was initially obtained. Multidetector CT imaging through the chest, abdomen and pelvis was performed using the standard protocol during bolus administration of intravenous contrast. Multiplanar reconstructed images and MIPs were obtained and reviewed to evaluate the vascular anatomy. RADIATION DOSE REDUCTION: This exam was performed according to the departmental dose-optimization program which includes automated exposure control, adjustment of the mA and/or kV according to patient size and/or use  of iterative reconstruction technique. CONTRAST:  35m OMNIPAQUE IOHEXOL 350 MG/ML SOLN COMPARISON:  CT chest dated 06/06/2021. CTA chest abdomen pelvis dated 12/20/2020. FINDINGS: CTA CHEST FINDINGS Cardiovascular: On unenhanced CT, there is no evidence of intramural hematoma. Status post descending thoracic aortic aneurysm stent graft repair. Again seen is opacification of the false lumen posterior to the stent graft at the T12 level (series 5/image 83), via type 2 endoleak from the T11-12 vertebral arteries, possibly mildly improved from the prior. Otherwise, no acute change. 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 The heart is normal in size.  No pericardial effusion. Coronary atherosclerosis of the LAD and right coronary artery. Mediastinum/Nodes: No suspicious mediastinal lymphadenopathy. Visualized thyroid is unremarkable. Lungs/Pleura: Biapical pleural-parenchymal scarring. Moderate centrilobular and paraseptal emphysematous changes, upper lung predominant. Mild bilateral lower lobe atelectasis, unchanged. Mild lingular and medial right middle lobe atelectasis. No focal consolidation. Stable 8 mm right middle lobe nodule (series 6/image 40), unchanged when remeasured in a similar fashion. No pleural effusion or pneumothorax. Musculoskeletal: No focal osseous lesions. Review of the MIP images confirms the above findings. CTA ABDOMEN AND PELVIS FINDINGS VASCULAR Aorta: No evidence abdominal aortic aneurysm or dissection. Patent. Atherosclerotic calcifications. Celiac: Patent. SMA: Patent.  Atherosclerotic calcifications at the origin. Renals: Patent bilaterally. Atherosclerotic calcifications at the origin on the left. IMA: Patent. Inflow: Patent bilaterally.  Atherosclerotic calcifications. Veins: Grossly unremarkable. Review of the MIP images confirms the above findings. NON-VASCULAR Hepatobiliary: Dominant 5.0 cm cyst in  the left hepatic lobe (series 5/image 36). Status post cholecystectomy. No intrahepatic or extrahepatic duct dilatation. Pancreas: Within normal limits. Spleen: Within normal limits. Adrenals/Urinary Tract: Adrenal glands are within normal limits. Right kidney is within normal limits. Left kidney is notable for mild hydronephrosis. Associated 3 mm distal left ureteral calculus just above the UVJ (series 5/image 175). Bladder is within normal limits. Stomach/Bowel: Stomach is within normal limits. No evidence of bowel obstruction. Normal appendix (series 5/image 134). Mild left colonic diverticulosis, without evidence of diverticulitis. Lymphatic: No suspicious abdominopelvic lymphadenopathy. Reproductive: Uterus is within normal limits. Bilateral ovaries are within normal limits. Other: No abdominopelvic ascites. Musculoskeletal: Mild degenerative changes of the lumbar spine. Review of the MIP images confirms the above findings. IMPRESSION: 3 mm distal left ureteral calculus just above the UVJ. Associated mild left hydronephrosis. Status post descending thoracic aortic aneurysm stent graft repair, without acute change. Persistent opacification of the false lumen posterior to the stent graft at the T12 level, via type 2 endoleak, possibly mildly improved. No evidence of abdominal aortic aneurysm or dissection. Electronically Signed   By: SJulian HyM.D.   On: 07/27/2021 18:42    PROCEDURES:  Critical Care performed:  No  Procedures   MEDICATIONS ORDERED IN ED: Medications  oxyCODONE-acetaminophen (PERCOCET/ROXICET) 5-325 MG per tablet 1 tablet (has no administration in time range)  tamsulosin (FLOMAX) capsule 0.4 mg (has no administration in time range)  iohexol (OMNIPAQUE) 350 MG/ML injection 75 mL (75 mLs Intravenous Contrast Given 07/27/21 1819)     IMPRESSION / MDM / ASSESSMENT AND PLAN / ED COURSE  I reviewed the triage vital signs and the nursing notes.                               Differential diagnosis includes, but is not limited to, nephrolithiasis, pyelonephritis, UTI, AAA, aortic dissection, renal artery dissection   Patient's diagnosis is consistent with ureterolithiasis.  Patient presents the emergency department with left flank pain beginning today.  Patient had a large amount of blood in her urine.  No history of kidney stones.  Patient did have a history of AAA with dissection that was surgically repaired a year ago.  Given the backslash flank pain I pursued CT angio to ensure no evidence of dissection or return of AAA.  Findings on CT were consistent with nephrolithiasis.  Patient will be prescribed pain medication, encouraged to drink fluids and placed on Flomax.  Follow-up with primary care or urology as needed..  Patient is given ED precautions to return to the ED for any worsening or new symptoms.    Clinical Course as of 07/27/21 1908  Wed Jul 27, 2021  1753 Patient presents emergency department with left-sided flank pain and feeling like she is urinating less than normal.  She does have a large amount of hemoglobin in her urine, slight bump in her creatinine.  However patient has a history of aortic aneurysm with dissection a year ago.  This did go for repair a year ago, however one of her initial symptoms was back pain.  Even though the patient has CVA tenderness, hematuria I will evaluate the patient with CT scan to include dissection runoff to evaluate for both nephrolithiasis, pyelonephritis as well as aneurysm or dissection. [JC]    Clinical Course User Index [JC] Mayco Walrond, Charline Bills, PA-C     FINAL CLINICAL IMPRESSION(S) / ED DIAGNOSES   Final diagnoses:  Nephrolithiasis     Rx / DC Orders   ED Discharge Orders          Ordered    oxyCODONE-acetaminophen (PERCOCET/ROXICET) 5-325 MG tablet  Every 6 hours PRN        07/27/21 1908    tamsulosin (FLOMAX) 0.4 MG CAPS capsule  Daily        07/27/21 1908             Note:  This  document was prepared using Dragon voice recognition software and may include unintentional dictation errors.   Brynda Peon 07/27/21 Pia Mau, MD 07/28/21 2352

## 2021-07-29 ENCOUNTER — Ambulatory Visit: Payer: Federal, State, Local not specified - PPO | Admitting: Family Medicine

## 2021-08-02 ENCOUNTER — Ambulatory Visit: Payer: Federal, State, Local not specified - PPO | Admitting: Family Medicine

## 2021-08-02 ENCOUNTER — Encounter: Payer: Self-pay | Admitting: Family Medicine

## 2021-08-02 VITALS — BP 110/70 | HR 79 | Temp 98.6°F | Resp 16 | Wt 214.0 lb

## 2021-08-02 DIAGNOSIS — Z6835 Body mass index (BMI) 35.0-35.9, adult: Secondary | ICD-10-CM | POA: Diagnosis not present

## 2021-08-02 DIAGNOSIS — N200 Calculus of kidney: Secondary | ICD-10-CM

## 2021-08-02 NOTE — Patient Instructions (Signed)
[email or web address]

## 2021-08-02 NOTE — Assessment & Plan Note (Signed)
Discussed importance of healthy weight management Discussed diet and exercise  Discussed healthy weight and wellness clinic - patient would like referral Discussed intermittent fasting vs carb cutting

## 2021-08-02 NOTE — Progress Notes (Signed)
I,Sulibeya S Dimas,acting as a Education administrator for Lavon Paganini, MD.,have documented all relevant documentation on the behalf of Lavon Paganini, MD,as directed by  Lavon Paganini, MD while in the presence of Lavon Paganini, MD.   Established patient visit   Patient: Angel Lane   DOB: 30-Aug-1950   71 y.o. Female  MRN: [id number] Visit Date: 08/02/2021  Today's healthcare provider: Lavon Paganini, MD   Chief Complaint  Patient presents with   Follow-up   Subjective    HPI  Follow up ER visit  Patient was seen in ER for flank pain on 07/27/21. She was treated for ureterolithiasis. Treatment for this included oxycodone-acetaminophen, tamsulosin. She reports excellent compliance with treatment. She reports this condition is Improved.   Patient is working on weight loss - she has noticed a few pounds of weight loss. Started eating more almond milk smoothies.  She will look into cutting back on carbs. -----------------------------------------------------------------------------------------   Medications: Outpatient Medications Prior to Visit  Medication Sig   acetaminophen (TYLENOL) 500 MG tablet Take 500 mg by mouth every 6 (six) hours as needed for moderate pain.   albuterol (VENTOLIN HFA) 108 (90 Base) MCG/ACT inhaler SMARTSIG:2 inhalation Via Inhaler Every 4 Hours PRN   carvedilol (COREG) 6.25 MG tablet Take 1 tablet (6.25 mg total) by mouth 2 (two) times daily with a meal.   hydrochlorothiazide (HYDRODIURIL) 25 MG tablet Take 25 mg by mouth daily.   predniSONE (DELTASONE) 10 MG tablet Take 10 mg by mouth daily.   pregabalin (LYRICA) 75 MG capsule Take 1 capsule (75 mg total) by mouth 2 (two) times daily.   rosuvastatin (CRESTOR) 5 MG tablet TAKE 1 TABLET (5 MG TOTAL) BY MOUTH DAILY.   tamsulosin (FLOMAX) 0.4 MG CAPS capsule Take 1 capsule (0.4 mg total) by mouth daily.   [DISCONTINUED] oxyCODONE-acetaminophen (PERCOCET/ROXICET) 5-325 MG tablet Take 1 tablet  by mouth every 6 (six) hours as needed for severe pain. (Patient not taking: Reported on 08/02/2021)   No facility-administered medications prior to visit.    Review of Systems  Constitutional:  Negative for chills and fever.  Respiratory:  Negative for chest tightness.   Cardiovascular:  Negative for chest pain and palpitations.  Skin:  Positive for color change.   Last CBC Lab Results  Component Value Date   WBC 8.8 07/27/2021   HGB 13.3 07/27/2021   HCT 41.7 07/27/2021   MCV 91.4 07/27/2021   MCH 29.2 07/27/2021   RDW 12.8 07/27/2021   PLT 199 42/35/3614   Last metabolic panel Lab Results  Component Value Date   GLUCOSE 103 (H) 07/27/2021   NA 137 07/27/2021   K 3.9 07/27/2021   CL 101 07/27/2021   CO2 27 07/27/2021   BUN 26 (H) 07/27/2021   CREATININE 1.28 (H) 07/27/2021   GFRNONAA 45 (L) 07/27/2021   CALCIUM 9.4 07/27/2021   PHOS 3.4 05/02/2020   PROT 6.7 07/04/2021   ALBUMIN 3.9 07/04/2021   LABGLOB 2.8 07/04/2021   AGRATIO 1.4 07/04/2021   BILITOT 0.6 07/04/2021   ALKPHOS 82 07/04/2021   AST 21 07/04/2021   ALT 23 07/04/2021   ANIONGAP 9 07/27/2021   Last lipids Lab Results  Component Value Date   CHOL 165 07/04/2021   HDL 53 07/04/2021   LDLCALC 95 07/04/2021   TRIG 94 07/04/2021   CHOLHDL 3.1 07/04/2021   Last hemoglobin A1c Lab Results  Component Value Date   HGBA1C 5.8 (H) 07/04/2021   Last thyroid functions Lab  Results  Component Value Date   TSH 1.270 02/19/2020       Objective    BP 110/70 (BP Location: Left Arm, Patient Position: Sitting, Cuff Size: Large)   Pulse 79   Temp 98.6 F (37 C) (Oral)   Resp 16   Wt 214 lb (97.1 kg)   SpO2 96%   BMI 35.61 kg/m  BP Readings from Last 3 Encounters:  08/02/21 110/70  07/27/21 (!) 140/93  07/04/21 130/81   Wt Readings from Last 3 Encounters:  08/02/21 214 lb (97.1 kg)  07/04/21 215 lb 14.4 oz (97.9 kg)  01/03/21 202 lb 9.6 oz (91.9 kg)      Physical Exam Vitals reviewed.   Constitutional:      General: She is not in acute distress.    Appearance: Normal appearance. She is well-developed. She is not diaphoretic.  HENT:     Head: Normocephalic and atraumatic.  Eyes:     General: No scleral icterus.    Conjunctiva/sclera: Conjunctivae normal.  Neck:     Thyroid: No thyromegaly.  Cardiovascular:     Rate and Rhythm: Normal rate and regular rhythm.     Pulses: Normal pulses.     Heart sounds: Normal heart sounds. No murmur heard. Pulmonary:     Effort: Pulmonary effort is normal. No respiratory distress.     Breath sounds: Normal breath sounds. No wheezing, rhonchi or rales.  Abdominal:     Tenderness: There is no right CVA tenderness or left CVA tenderness.  Musculoskeletal:     Cervical back: Neck supple.     Right lower leg: No edema.     Left lower leg: No edema.  Lymphadenopathy:     Cervical: No cervical adenopathy.  Skin:    General: Skin is warm and dry.  Neurological:     Mental Status: She is alert and oriented to person, place, and time. Mental status is at baseline.  Psychiatric:        Mood and Affect: Mood normal.        Behavior: Behavior normal.      No results found for any visits on 08/02/21.  Assessment & Plan     Problem List Items Addressed This Visit       Other   Obesity    Discussed importance of healthy weight management Discussed diet and exercise  Discussed healthy weight and wellness clinic - patient would like referral Discussed intermittent fasting vs carb cutting      Relevant Orders   Amb Ref to Medical Weight Management   Other Visit Diagnoses     Nephrolithiasis    -  Primary      - new problem from recent ED visit - pain has resolved - assuming it passed - she is no longer taking pain meds - can finish one more week of flomax - no need for repeat imaging given resolution of pain  Return in about 6 months (around 02/02/2022) for chronic disease f/u, as scheduled.      I, Lavon Paganini, MD, have reviewed all documentation for this visit. The documentation on 08/02/21 for the exam, diagnosis, procedures, and orders are all accurate and complete.   Bobbe Quilter, [name], MD, MPH [facility]

## 2021-08-15 ENCOUNTER — Encounter: Payer: Self-pay | Admitting: Family Medicine

## 2021-08-15 MED ORDER — FLUTICASONE PROPIONATE 50 MCG/ACT NA SUSP: 2.0000 | Freq: Every day | NASAL | 6 refills | Status: DC

## 2021-09-29 ENCOUNTER — Other Ambulatory Visit
Admission: RE | Admit: 2021-09-29 | Discharge: 2021-09-29 | Disposition: A | Discharge status: Home / Self Care | Payer: Federal, State, Local not specified - PPO | Source: Ambulatory Visit | Attending: Student | Admitting: Student

## 2021-09-29 DIAGNOSIS — R0609 Other forms of dyspnea: Secondary | ICD-10-CM | POA: Diagnosis present

## 2021-09-29 DIAGNOSIS — Z9889 Other specified postprocedural states: Secondary | ICD-10-CM | POA: Insufficient documentation

## 2021-09-29 DIAGNOSIS — Z8679 Personal history of other diseases of the circulatory system: Secondary | ICD-10-CM | POA: Insufficient documentation

## 2021-09-29 LAB — BRAIN NATRIURETIC PEPTIDE: B Natriuretic Peptide: 15.8 pg/mL (ref 0.0–100.0)

## 2021-10-10 ENCOUNTER — Ambulatory Visit: Payer: Self-pay | Admitting: *Deleted

## 2021-10-10 NOTE — Telephone Encounter (Signed)
There is no availability today. Recommend she go to urgent care if she can't make till morning.

## 2021-10-10 NOTE — Telephone Encounter (Signed)
  Chief Complaint: left side face swelling with rash to outer earlobe  Symptoms: swelling to left face 1 x size of right side of face. "Feels tight". Rash tiny bumps to left outer earlobe. No pain no itching. No drainage  Frequency: woke up today like this  Pertinent Negatives: Patient denies chest pain no difficulty breathing no fever Disposition: '[]'$ ED /'[]'$ Urgent Care (no appt availability in office) / '[x]'$ Appointment(In office/virtual)/ '[]'$  [place]/ '[]'$ Home Care/ '[]'$ Refused Recommended Disposition /'[]'$  Mobile Bus/ '[]'$  Follow-up with PCP Additional Notes:   Please advise if appt can be scheduled today instead of tomorrow. Care advise given patient would like for PCP to give care advise how to treat today as well.   Reason for Disposition  [1] Mild face swelling (puffiness) AND [2] persists > 3 days  Answer Assessment - Initial Assessment Questions 1. ONSET: "When did the swelling start?" (e.g., minutes, hours, days)     Today upon awakening  2. LOCATION: "What part of the face is swollen?"     Left side of face and earlobe outer area 3. SEVERITY: "How swollen is it?"     1 x size of right side and feels tight  4. ITCHING: "Is there any itching?" If Yes, ask: "How much?"   (Scale 1-10; mild, moderate or severe)     No  5. PAIN: "Is the swelling painful to touch?" If Yes, ask: "How painful is it?"   (Scale 1-10; mild, moderate or severe)   - NONE (0): no pain   - MILD (1-3): doesn't interfere with normal activities    - MODERATE (4-7): interferes with normal activities or awakens from sleep    - SEVERE (8-10): excruciating pain, unable to do any normal activities      No  6. FEVER: "Do you have a fever?" If Yes, ask: "What is it, how was it measured, and when did it start?"      Na  7. CAUSE: "What do you think is causing the face swelling?"     Not sure 8. RECURRENT SYMPTOM: "Have you had face swelling before?" If Yes, ask: "When was the last time?" "What happened  that time?"     Na  9. OTHER SYMPTOMS: "Do you have any other symptoms?" (e.g., toothache, leg swelling)     Rash  with tiny bumps around earlobe left side of face  and face swollen  10. PREGNANCY: "Is there any chance you are pregnant?" "When was your last menstrual period?"       Na  Protocols used: Face Swelling-A-AH

## 2021-10-10 NOTE — Telephone Encounter (Signed)
Pt informed and states she feels she can wait until tomorrow.

## 2021-10-11 ENCOUNTER — Encounter: Payer: Self-pay | Admitting: Family Medicine

## 2021-10-11 ENCOUNTER — Ambulatory Visit: Payer: Federal, State, Local not specified - PPO | Admitting: Family Medicine

## 2021-10-11 VITALS — BP 116/60 | HR 92 | Temp 98.2°F | Resp 20 | Wt 215.5 lb

## 2021-10-11 DIAGNOSIS — B029 Zoster without complications: Secondary | ICD-10-CM | POA: Diagnosis not present

## 2021-10-11 MED ORDER — VALACYCLOVIR HCL 1 G PO TABS: 1000.0000 mg | ORAL_TABLET | Freq: Three times a day (TID) | ORAL | 0 refills | Status: DC

## 2021-10-11 MED ORDER — PREDNISONE 10 MG (21) PO TBPK
ORAL_TABLET | ORAL | 0 refills | Status: DC
Start: 2021-10-11 — End: 2021-12-06

## 2021-10-11 NOTE — Progress Notes (Signed)
   SUBJECTIVE:   CHIEF COMPLAINT / HPI:   RASH Duration:   yesterday   Location: face  Itching: no Burning: yes Redness: yes Blisters: few bumps, crusting Painful: no Fevers: no Change in detergents/soaps/personal care products: no Recent illness: no History of same: no Context: stable Alleviating factors: antihistamine Treatments attempted:antihistamine Shortness of breath: no  Throat/tongue swelling: no   OBJECTIVE:   BP 116/60 (BP Location: Left Arm, Patient Position: Sitting, Cuff Size: Large)   Pulse 92   Temp 98.2 F (36.8 C) (Oral)   Resp 20   Wt 215 lb 8 oz (97.8 kg)   BMI 35.86 kg/m   Gen: well appearing, in NAD Card: RRR Lungs: CTAB Skin: erythematous rash located to L side upper face from lateral eyebrow and cheekbone to L ear along V2/V3 dermatome with crusted papules along L ear helix.     ASSESSMENT/PLAN:   Shingles Appearance and symptoms consistent with early shingles. Rx prednisone and valtrex x7 days. Encouraged shingles vaccine once healed. F/u prn.    Myles Gip, DO

## 2021-10-12 ENCOUNTER — Encounter: Payer: Self-pay | Admitting: Family Medicine

## 2021-10-23 ENCOUNTER — Other Ambulatory Visit: Payer: Self-pay | Admitting: Family Medicine

## 2021-11-09 ENCOUNTER — Other Ambulatory Visit: Payer: Self-pay

## 2021-11-09 DIAGNOSIS — I712 Thoracic aortic aneurysm, without rupture, unspecified: Secondary | ICD-10-CM

## 2021-11-11 ENCOUNTER — Other Ambulatory Visit: Payer: Self-pay

## 2021-11-11 DIAGNOSIS — I71019 Dissection of thoracic aorta, unspecified: Secondary | ICD-10-CM

## 2021-11-18 ENCOUNTER — Other Ambulatory Visit: Payer: Self-pay | Admitting: Family Medicine

## 2021-11-22 ENCOUNTER — Other Ambulatory Visit: Payer: Federal, State, Local not specified - PPO

## 2021-11-22 ENCOUNTER — Ambulatory Visit
Admission: RE | Admit: 2021-11-22 | Discharge: 2021-11-22 | Disposition: A | Discharge status: Home / Self Care | Payer: Federal, State, Local not specified - PPO | Source: Ambulatory Visit | Attending: Vascular Surgery | Admitting: Vascular Surgery

## 2021-11-22 DIAGNOSIS — I712 Thoracic aortic aneurysm, without rupture, unspecified: Secondary | ICD-10-CM | POA: Insufficient documentation

## 2021-11-22 LAB — POCT I-STAT CREATININE: Creatinine, Ser: 0.9 mg/dL (ref 0.44–1.00)

## 2021-11-22 MED ORDER — IOHEXOL 350 MG/ML SOLN
100.0000 mL | Freq: Once | INTRAVENOUS | Status: AC | PRN
  Administered 2021-11-22: 100 mL via INTRAVENOUS

## 2021-12-05 NOTE — Progress Notes (Signed)
[facility] [place] PROGRESS NOTE  ASSESSMENT / PLAN: Angel Lane is a 71 y.o. 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 Doing well. CT angiogram reviewed in detail with patient. No worrisome features. She is describing a sensation of fullness in the anterior neck. Will refer to ENT. Follow up with [place] again in 2 years with repeat CTA.   SUBJECTIVE: Reports fullness in the bilateral anterior necks. Does not describe globus sensation. Feels she needs to extend her neck to get relief.   OBJECTIVE: BP 136/82 (BP Location: Left Arm, Patient Position: Sitting, Cuff Size: Large)   Pulse 88   Temp 98.3 F (36.8 C)   Resp 20   Ht '5\' 5"'$  (1.651 m)   Wt 220 lb (99.8 kg)   SpO2 98%   BMI 36.61 kg/m   Constitutional: well appearing. no acute distress. CNS: 5/5 throughout Neck: incisions well healed. No palpable mass. No prominent lymph nodes. Cardiac: RRR. Pulmonary: unlabored Abdomen: soft Vascular: 2+ radial pulses     Latest Ref Rng & Units 07/27/2021    3:02 PM 06/19/2020    6:47 PM 05/31/2020    9:17 AM  CBC  WBC 4.0 - 10.5 K/uL 8.8  6.3  5.9   Hemoglobin 12.0 - 15.0 g/dL 13.3  12.5  10.4   Hematocrit 36.0 - 46.0 % 41.7  39.5  32.7   Platelets 150 - 400 K/uL 199  197  257         Latest Ref Rng & Units 11/22/2021    2:19 PM 07/27/2021    3:02 PM 07/04/2021    2:34 PM  CMP  Glucose 70 - 99 mg/dL  103  77   BUN 8 - 23 mg/dL  26  13   Creatinine 0.44 - 1.00 mg/dL 0.90  1.28  0.86   Sodium 135 - 145 mmol/L  137  145   Potassium 3.5 - 5.1 mmol/L  3.9  4.4   Chloride 98 - 111 mmol/L  101  105   CO2 22 - 32 mmol/L  27  27   Calcium 8.9 - 10.3 mg/dL  9.4  9.7   Total Protein 6.0 - 8.5 g/dL   6.7   Total Bilirubin 0.0 - 1.2 mg/dL   0.6   Alkaline Phos 44 - 121 IU/L   82   AST 0 - 40 IU/L   21   ALT 0 - 32 IU/L   23     Estimated Creatinine Clearance: 66.1 mL/min (by C-G formula based on SCr of 0.9  mg/dL).  CT angiogram reviewed in detail.  Bilateral transpositions appear widely patent.  TEVAR appears in good position.  No evidence of endoleak.  Yevonne Aline. Stanford Breed, MD [facility] Phone Number: [phone] 12/06/2021 5:24 PM

## 2021-12-06 ENCOUNTER — Ambulatory Visit: Payer: Federal, State, Local not specified - PPO | Admitting: Vascular Surgery

## 2021-12-06 ENCOUNTER — Encounter: Payer: Self-pay | Admitting: Vascular Surgery

## 2021-12-06 VITALS — BP 136/82 | HR 88 | Temp 98.3°F | Resp 20 | Ht 65.0 in | Wt 220.0 lb

## 2021-12-06 DIAGNOSIS — Z95828 Presence of other vascular implants and grafts: Secondary | ICD-10-CM | POA: Diagnosis not present

## 2022-01-18 NOTE — Progress Notes (Unsigned)
I,Ashish Rossetti S Kathyjo Briere,acting as a Education administrator for Lavon Paganini, MD.,have documented all relevant documentation on the behalf of Lavon Paganini, MD,as directed by  Lavon Paganini, MD while in the presence of Lavon Paganini, MD.     Established patient visit   Patient: Angel Lane   DOB: Sep 19, 1950   71 y.o. Female  MRN: [id number] Visit Date: 01/19/2022  Today's healthcare provider: Lavon Paganini, MD   No chief complaint on file.  Subjective    HPI  Hypertension, follow-up  BP Readings from Last 3 Encounters:  12/06/21 136/82  10/11/21 116/60  08/02/21 110/70   Wt Readings from Last 3 Encounters:  12/06/21 220 lb (99.8 kg)  10/11/21 215 lb 8 oz (97.8 kg)  08/02/21 214 lb (97.1 kg)     She was last seen for hypertension 6 months ago.  BP at that visit was 110/70. Management since that visit includes continue HCTZ daily. She reports {excellent/good/fair/poor:19665} compliance with treatment. She {is/is not:9024} having side effects. {document side effects if present:1} She {is/is not:9024} exercising. She {is/is not:9024} adherent to low salt diet.   Outside blood pressures are {enter patient reported home BP, or 'not being checked':1}.  She {does/does not:200015} smoke.  Use of agents associated with hypertension: {bp agents assoc with hypertension:511::"none"}.   --------------------------------------------------------------------------------------------------- Lipid/Cholesterol, follow-up  Last Lipid Panel: Lab Results  Component Value Date   CHOL 165 07/04/2021   [place] 95 07/04/2021   HDL 53 07/04/2021   TRIG 94 07/04/2021    She was last seen for this 6 months ago.  Management since that visit includes no changes.  She reports {excellent/good/fair/poor:19665} compliance with treatment. She {is/is not:9024} having side effects. {document side effects if present:1}  Symptoms: {Yes/No:20286} appetite changes {Yes/No:20286} foot  ulcerations  {Yes/No:20286} chest pain {Yes/No:20286} chest pressure/discomfort  {Yes/No:20286} dyspnea {Yes/No:20286} orthopnea  {Yes/No:20286} fatigue {Yes/No:20286} lower extremity edema  {Yes/No:20286} palpitations {Yes/No:20286} paroxysmal nocturnal dyspnea  {Yes/No:20286} nausea {Yes/No:20286} numbness or tingling of extremity  {Yes/No:20286} polydipsia {Yes/No:20286} polyuria  {Yes/No:20286} speech difficulty {Yes/No:20286} syncope    Last metabolic panel Lab Results  Component Value Date   GLUCOSE 103 (H) 07/27/2021   NA 137 07/27/2021   K 3.9 07/27/2021   BUN 26 (H) 07/27/2021   CREATININE 0.90 11/22/2021   EGFR 72 07/04/2021   GFRNONAA 45 (L) 07/27/2021   CALCIUM 9.4 07/27/2021   AST 21 07/04/2021   ALT 23 07/04/2021   The ASCVD Risk score (Arnett DK, et al., 2019) failed to calculate for the following reasons:   The patient has a prior MI or stroke diagnosis  ---------------------------------------------------------------------------------------------------   Medications: Outpatient Medications Prior to Visit  Medication Sig   acetaminophen (TYLENOL) 500 MG tablet Take 500 mg by mouth every 6 (six) hours as needed for moderate pain.   albuterol (VENTOLIN HFA) 108 (90 Base) MCG/ACT inhaler SMARTSIG:2 inhalation Via Inhaler Every 4 Hours PRN   budesonide-formoterol (SYMBICORT) 160-4.5 MCG/ACT inhaler SMARTSIG:2 inhalation Via Inhaler Twice Daily   carvedilol (COREG) 6.25 MG tablet TAKE 1 TABLET BY MOUTH 2 TIMES DAILY WITH A MEAL.   fluticasone (FLONASE) 50 MCG/ACT nasal spray Place 2 sprays into both nostrils daily.   hydrochlorothiazide (HYDRODIURIL) 25 MG tablet Take 25 mg by mouth daily.   rosuvastatin (CRESTOR) 5 MG tablet TAKE 1 TABLET (5 MG TOTAL) BY MOUTH DAILY.   tamsulosin (FLOMAX) 0.4 MG CAPS capsule Take 1 capsule (0.4 mg total) by mouth daily.   No facility-administered medications prior to visit.    Review of Systems  {  Labs  Heme  Chem   Endocrine  Serology  Results Review (optional):23779}   Objective    There were no vitals taken for this visit. BP Readings from Last 3 Encounters:  12/06/21 136/82  10/11/21 116/60  08/02/21 110/70   Wt Readings from Last 3 Encounters:  12/06/21 220 lb (99.8 kg)  10/11/21 215 lb 8 oz (97.8 kg)  08/02/21 214 lb (97.1 kg)      Physical Exam  ***  No results found for any visits on 01/19/22.  Assessment & Plan     ***  No follow-ups on file.      {provider attestation***:1}   Lavon Paganini, MD  [facility] [phone] (phone) [phone] (fax)  [place]

## 2022-01-19 ENCOUNTER — Encounter: Payer: Self-pay | Admitting: Family Medicine

## 2022-01-19 ENCOUNTER — Ambulatory Visit: Payer: Federal, State, Local not specified - PPO | Admitting: Family Medicine

## 2022-01-19 VITALS — BP 128/72 | HR 84 | Temp 98.3°F | Resp 16 | Wt 223.5 lb

## 2022-01-19 DIAGNOSIS — R7303 Prediabetes: Secondary | ICD-10-CM | POA: Diagnosis not present

## 2022-01-19 DIAGNOSIS — E559 Vitamin D deficiency, unspecified: Secondary | ICD-10-CM

## 2022-01-19 DIAGNOSIS — M25552 Pain in left hip: Secondary | ICD-10-CM

## 2022-01-19 DIAGNOSIS — Z6837 Body mass index (BMI) 37.0-37.9, adult: Secondary | ICD-10-CM

## 2022-01-19 DIAGNOSIS — Z8673 Personal history of transient ischemic attack (TIA), and cerebral infarction without residual deficits: Secondary | ICD-10-CM

## 2022-01-19 DIAGNOSIS — I10 Essential (primary) hypertension: Secondary | ICD-10-CM

## 2022-01-19 DIAGNOSIS — E7800 Pure hypercholesterolemia, unspecified: Secondary | ICD-10-CM

## 2022-01-19 NOTE — Assessment & Plan Note (Signed)
Previously well controlled Continue statin Repeat FLP and CMP Goal LDL < 70 

## 2022-01-19 NOTE — Assessment & Plan Note (Signed)
Well controlled Continue current medications Recheck metabolic panel F/u in 6 months  

## 2022-01-19 NOTE — Assessment & Plan Note (Signed)
Recommend low carb diet °Recheck A1c  °

## 2022-01-19 NOTE — Assessment & Plan Note (Signed)
Persistent Not improving Concern for OA XRay today Continue tylenol HEP given

## 2022-01-19 NOTE — Assessment & Plan Note (Signed)
Recent episode 2 months ago with sudden blurred vision while at dinner that has now resolved Concern for possible TIA Cardiology referred to [facility], but patient has not gotten a call back about it - will re-refer

## 2022-01-19 NOTE — Assessment & Plan Note (Signed)
Continue supplement Recheck level 

## 2022-01-19 NOTE — Assessment & Plan Note (Signed)
Discussed importance of healthy weight management Discussed diet and exercise  

## 2022-01-20 LAB — COMPREHENSIVE METABOLIC PANEL
ALT: 23 IU/L (ref 0–32)
AST: 17 IU/L (ref 0–40)
Albumin/Globulin Ratio: 1.6 (ref 1.2–2.2)
Albumin: 3.9 g/dL (ref 3.8–4.8)
Alkaline Phosphatase: 90 IU/L (ref 44–121)
BUN/Creatinine Ratio: 20 (ref 12–28)
BUN: 15 mg/dL (ref 8–27)
Bilirubin Total: 0.4 mg/dL (ref 0.0–1.2)
CO2: 28 mmol/L (ref 20–29)
Calcium: 9.3 mg/dL (ref 8.7–10.3)
Chloride: 101 mmol/L (ref 96–106)
Creatinine, Ser: 0.74 mg/dL (ref 0.57–1.00)
Globulin, Total: 2.5 g/dL (ref 1.5–4.5)
Glucose: 107 mg/dL — ABNORMAL HIGH (ref 70–99)
Potassium: 4 mmol/L (ref 3.5–5.2)
Sodium: 140 mmol/L (ref 134–144)
Total Protein: 6.4 g/dL (ref 6.0–8.5)
eGFR: 86 mL/min/{1.73_m2} (ref >59)

## 2022-01-20 LAB — LIPID PANEL
Chol/HDL Ratio: 2.9 ratio (ref 0.0–4.4)
Cholesterol, Total: 170 mg/dL (ref 100–199)
HDL: 58 mg/dL (ref >39)
LDL Chol Calc (NIH): 97 mg/dL (ref 0–99)
Triglycerides: 79 mg/dL (ref 0–149)
VLDL Cholesterol Cal: 15 mg/dL (ref 5–40)

## 2022-01-20 LAB — HEMOGLOBIN A1C
Est. average glucose Bld gHb Est-mCnc: 126 mg/dL
Hgb A1c MFr Bld: 6 % — ABNORMAL HIGH (ref 4.8–5.6)

## 2022-01-20 LAB — VITAMIN D 25 HYDROXY (VIT D DEFICIENCY, FRACTURES): Vit D, 25-Hydroxy: 35.1 ng/mL (ref 30.0–100.0)

## 2022-01-25 ENCOUNTER — Encounter: Payer: Self-pay | Admitting: Family Medicine

## 2022-02-23 ENCOUNTER — Other Ambulatory Visit: Payer: Self-pay | Admitting: Neurology

## 2022-02-23 DIAGNOSIS — G459 Transient cerebral ischemic attack, unspecified: Secondary | ICD-10-CM

## 2022-03-06 ENCOUNTER — Encounter: Payer: Self-pay | Admitting: Family Medicine

## 2022-03-07 ENCOUNTER — Ambulatory Visit: Payer: Self-pay

## 2022-03-07 ENCOUNTER — Telehealth: Payer: Federal, State, Local not specified - PPO | Admitting: Physician Assistant

## 2022-03-07 DIAGNOSIS — J019 Acute sinusitis, unspecified: Secondary | ICD-10-CM

## 2022-03-07 DIAGNOSIS — B9789 Other viral agents as the cause of diseases classified elsewhere: Secondary | ICD-10-CM

## 2022-03-07 MED ORDER — FLUTICASONE PROPIONATE 50 MCG/ACT NA SUSP: 2.0000 | Freq: Every day | NASAL | 0 refills | Status: AC

## 2022-03-07 NOTE — Telephone Encounter (Signed)
  Chief Complaint: Med Question Symptoms: NA Frequency: NA Pertinent Negatives: Patient denies NA Disposition: '[]'$ ED /'[]'$ Urgent Care (no appt availability in office) / '[]'$ Appointment(In office/virtual)/ '[]'$  [facility]/ '[]'$ Home Care/ '[]'$ Refused Recommended Disposition /'[]'$ [facility]/ '[x]'$  Follow-up with PCP Additional Notes: Pt had tele visit today, advised Mucinex.  Questioning if ok to take with Trelegy. Tele provider did answer, pt would like PCPs input. Also questioning if Mucinex sinus would be appropriate to take.  Please advise. Reason for Disposition  Caller wants to use a complementary or alternative medicine  Answer Assessment - Initial Assessment Questions 1. NAME of MEDICINE: "What medicine(s) are you calling about?"     Mucinex 2. QUESTION: "What is your question?" (e.g., double dose of medicine, side effect)     Can I take with Trelegy 3. PRESCRIBER: "Who prescribed the medicine?" Reason: if prescribed by specialist, call should be referred to that group.     Evisit  Protocols used: Medication Question Call-A-AH

## 2022-03-07 NOTE — Telephone Encounter (Signed)
Summary: medication management   Patient had e visit and unsure if she can take mucinex because she takes TRELEGY ELLIPTA 100-62.5-25 MCG/ACT AEPB.        Called pt - LMOMTCB

## 2022-03-07 NOTE — Progress Notes (Signed)
I have spent 5 minutes in review of e-visit questionnaire, review and updating patient chart, medical decision making and response to patient.   Mcguire Gasparyan Cody Keyron Pokorski, PA-C    

## 2022-03-07 NOTE — Telephone Encounter (Signed)
Summary: medication management   Patient had e visit and unsure if she can take mucinex because she takes TRELEGY ELLIPTA 100-62.5-25 MCG/ACT AEPB.      Called pt - LMOMTCB

## 2022-03-07 NOTE — Progress Notes (Signed)

## 2022-03-08 ENCOUNTER — Other Ambulatory Visit: Payer: Self-pay | Admitting: Family Medicine

## 2022-03-08 ENCOUNTER — Encounter: Payer: Self-pay | Admitting: Family Medicine

## 2022-03-08 MED ORDER — GUAIFENESIN ER 600 MG PO TB12: 600.0000 mg | ORAL_TABLET | Freq: Two times a day (BID) | ORAL | 0 refills | Status: DC | PRN

## 2022-03-08 NOTE — Telephone Encounter (Signed)
Patient is requesting antibiotic. States having green mucus and that for her that's a infection and need antibiotic.

## 2022-03-13 ENCOUNTER — Encounter: Payer: Self-pay | Admitting: Family Medicine

## 2022-03-13 ENCOUNTER — Ambulatory Visit: Payer: Federal, State, Local not specified - PPO | Admitting: Family Medicine

## 2022-03-13 VITALS — BP 111/56 | HR 90 | Temp 98.9°F | Resp 16 | Wt 221.3 lb

## 2022-03-13 DIAGNOSIS — B9789 Other viral agents as the cause of diseases classified elsewhere: Secondary | ICD-10-CM | POA: Diagnosis not present

## 2022-03-13 DIAGNOSIS — J019 Acute sinusitis, unspecified: Secondary | ICD-10-CM

## 2022-03-13 NOTE — Progress Notes (Signed)
I,Sulibeya S Dimas,acting as a Education administrator for Lavon Paganini, MD.,have documented all relevant documentation on the behalf of Lavon Paganini, MD,as directed by  Lavon Paganini, MD while in the presence of Lavon Paganini, MD.     Established patient visit   Patient: Angel Lane   DOB: January 11, 1951   72 y.o. Female  MRN: [id number] Visit Date: 03/13/2022  Today's healthcare provider: Lavon Paganini, MD   Chief Complaint  Patient presents with   URI   Subjective    HPI  Upper respiratory symptoms She complains of congestion, productive cough with  yellow colored sputum, shortness of breath, and sinus pressure.with no fever, chills, night sweats or weight loss. Onset of symptoms was a few days ago and gradually improving.She is drinking plenty of fluids.  Past history is significant for COPD. Patient is former smoker. Patient did an E-Visit on 03/07/22. Symptoms started 12/30.  Doing a lot better, but having postnasal drip. ---------------------------------------------------------------------------------------------------   Medications: Outpatient Medications Prior to Visit  Medication Sig   acetaminophen (TYLENOL) 500 MG tablet Take 500 mg by mouth every 6 (six) hours as needed for moderate pain.   albuterol (VENTOLIN HFA) 108 (90 Base) MCG/ACT inhaler SMARTSIG:2 inhalation Via Inhaler Every 4 Hours PRN   carvedilol (COREG) 6.25 MG tablet TAKE 1 TABLET BY MOUTH 2 TIMES DAILY WITH A MEAL.   fluticasone (FLONASE) 50 MCG/ACT nasal spray Place 2 sprays into both nostrils daily.   hydrochlorothiazide (HYDRODIURIL) 25 MG tablet Take 25 mg by mouth daily.   rosuvastatin (CRESTOR) 5 MG tablet TAKE 1 TABLET (5 MG TOTAL) BY MOUTH DAILY.   TRELEGY ELLIPTA 100-62.5-25 MCG/ACT AEPB Inhale 1 puff into the lungs daily in the afternoon.   [DISCONTINUED] guaiFENesin (MUCINEX) 600 MG 12 hr tablet Take 1 tablet (600 mg total) by mouth 2 (two) times daily as needed. (Patient not taking:  Reported on 03/13/2022)   No facility-administered medications prior to visit.    Review of Systems  Constitutional:  Negative for chills and fever.  HENT:  Positive for congestion, postnasal drip, rhinorrhea, sinus pressure and sinus pain. Negative for sore throat.   Respiratory:  Positive for cough and shortness of breath. Negative for wheezing.   Cardiovascular:  Negative for chest pain.  Gastrointestinal:  Negative for abdominal pain, nausea and vomiting.       Objective    BP (!) 111/56 (BP Location: Left Arm, Patient Position: Sitting, Cuff Size: Large)   Pulse 90   Temp 98.9 F (37.2 C) (Oral)   Resp 16   Wt 221 lb 4.8 oz (100.4 kg)   SpO2 99%   BMI 36.83 kg/m  BP Readings from Last 3 Encounters:  03/13/22 (!) 111/56  01/19/22 128/72  12/06/21 136/82   Wt Readings from Last 3 Encounters:  03/13/22 221 lb 4.8 oz (100.4 kg)  01/19/22 223 lb 8 oz (101.4 kg)  12/06/21 220 lb (99.8 kg)      Physical Exam Vitals reviewed.  Constitutional:      General: She is not in acute distress.    Appearance: Normal appearance. She is well-developed. She is not diaphoretic.  HENT:     Head: Normocephalic and atraumatic.     Right Ear: Tympanic membrane, ear canal and external ear normal.     Left Ear: Tympanic membrane, ear canal and external ear normal.     Nose: Congestion present.     Comments: No sinus TTP    Mouth/Throat:     Mouth: Mucous membranes  are moist.     Pharynx: Oropharynx is clear. No oropharyngeal exudate.  Eyes:     General: No scleral icterus.    Conjunctiva/sclera: Conjunctivae normal.     Pupils: Pupils are equal, round, and reactive to light.  Neck:     Thyroid: No thyromegaly.  Cardiovascular:     Rate and Rhythm: Normal rate and regular rhythm.     Heart sounds: Normal heart sounds.  Pulmonary:     Effort: Pulmonary effort is normal. No respiratory distress.     Breath sounds: Normal breath sounds. No wheezing or rales.  Musculoskeletal:      Cervical back: Neck supple.     Right lower leg: No edema.     Left lower leg: No edema.  Lymphadenopathy:     Cervical: No cervical adenopathy.  Skin:    General: Skin is warm and dry.  Neurological:     Mental Status: She is alert and oriented to person, place, and time. Mental status is at baseline.       No results found for any visits on 03/13/22.  Assessment & Plan     1. Acute viral sinusitis - symptoms and exam c/w sinusitis, but improved significantly with symptom management  - no evidence of AOM, CAP, strep pharyngitis, or other infection - given improvement of symptoms, suspect viral etiology - discussed symptomatic management (flonase, decongestants, etc), natural course, and return precautions    - if worsens again, consider abx treatment for bacterial sinusitis  Return if symptoms worsen or fail to improve.      I, Lavon Paganini, MD, have reviewed all documentation for this visit. The documentation on 03/13/22 for the exam, diagnosis, procedures, and orders are all accurate and complete.   Tayia Stonesifer, [name], MD, MPH [facility]

## 2022-03-14 ENCOUNTER — Ambulatory Visit: Admission: RE | Admit: 2022-03-14 | Payer: Federal, State, Local not specified - PPO | Source: Ambulatory Visit

## 2022-03-18 ENCOUNTER — Emergency Department: Payer: Federal, State, Local not specified - PPO

## 2022-03-18 ENCOUNTER — Encounter: Payer: Self-pay | Admitting: Family Medicine

## 2022-03-18 ENCOUNTER — Other Ambulatory Visit: Payer: Self-pay

## 2022-03-18 ENCOUNTER — Emergency Department
Admission: EM | Admit: 2022-03-18 | Discharge: 2022-03-18 | Disposition: A | Discharge status: Home / Self Care | Payer: Federal, State, Local not specified - PPO | Attending: Emergency Medicine | Admitting: Emergency Medicine

## 2022-03-18 DIAGNOSIS — R0789 Other chest pain: Secondary | ICD-10-CM | POA: Diagnosis present

## 2022-03-18 DIAGNOSIS — R079 Chest pain, unspecified: Secondary | ICD-10-CM

## 2022-03-18 DIAGNOSIS — R1013 Epigastric pain: Secondary | ICD-10-CM

## 2022-03-18 LAB — BASIC METABOLIC PANEL
Anion gap: 7 (ref 5–15)
BUN: 20 mg/dL (ref 8–23)
CO2: 28 mmol/L (ref 22–32)
Calcium: 9.7 mg/dL (ref 8.9–10.3)
Chloride: 102 mmol/L (ref 98–111)
Creatinine, Ser: 0.89 mg/dL (ref 0.44–1.00)
GFR, Estimated: >60 mL/min (ref >60)
Glucose, Bld: 110 mg/dL — ABNORMAL HIGH (ref 70–99)
Potassium: 4.3 mmol/L (ref 3.5–5.1)
Sodium: 137 mmol/L (ref 135–145)

## 2022-03-18 LAB — HEPATIC FUNCTION PANEL
ALT: 21 U/L (ref 0–44)
AST: 21 U/L (ref 15–41)
Albumin: 3.7 g/dL (ref 3.5–5.0)
Alkaline Phosphatase: 68 U/L (ref 38–126)
Bilirubin, Direct: 0.2 mg/dL (ref 0.0–0.2)
Indirect Bilirubin: 1 mg/dL — ABNORMAL HIGH (ref 0.3–0.9)
Total Bilirubin: 1.2 mg/dL (ref 0.3–1.2)
Total Protein: 7.6 g/dL (ref 6.5–8.1)

## 2022-03-18 LAB — LIPASE, BLOOD: Lipase: 34 U/L (ref 11–51)

## 2022-03-18 LAB — CBC
HCT: 44.5 % (ref 36.0–46.0)
Hemoglobin: 14.3 g/dL (ref 12.0–15.0)
MCH: 29.5 pg (ref 26.0–34.0)
MCHC: 32.1 g/dL (ref 30.0–36.0)
MCV: 91.8 fL (ref 80.0–100.0)
Platelets: 251 10*3/uL (ref 150–400)
RBC: 4.85 MIL/uL (ref 3.87–5.11)
RDW: 12.7 % (ref 11.5–15.5)
WBC: 7.1 10*3/uL (ref 4.0–10.5)
nRBC: 0 % (ref 0.0–0.2)

## 2022-03-18 LAB — TROPONIN I (HIGH SENSITIVITY)
Troponin I (High Sensitivity): 10 ng/L (ref <18)
Troponin I (High Sensitivity): 8 ng/L (ref <18)

## 2022-03-18 MED ORDER — IOHEXOL 350 MG/ML SOLN
100.0000 mL | Freq: Once | INTRAVENOUS | Status: AC | PRN
  Administered 2022-03-18: 100 mL via INTRAVENOUS

## 2022-03-18 MED ORDER — FAMOTIDINE 20 MG PO TABS: 20.0000 mg | ORAL_TABLET | Freq: Every day | ORAL | 1 refills | Status: DC

## 2022-03-18 MED ORDER — SUCRALFATE 1 G PO TABS: 1.0000 g | ORAL_TABLET | Freq: Four times a day (QID) | ORAL | 0 refills | Status: DC

## 2022-03-18 MED ORDER — ALUM & MAG HYDROXIDE-SIMETH 200-200-20 MG/5ML PO SUSP
30.0000 mL | Freq: Once | ORAL | Status: AC
  Administered 2022-03-18: 30 mL via ORAL
  Filled 2022-03-18: qty 30

## 2022-03-18 MED ORDER — LIDOCAINE VISCOUS HCL 2 % MT SOLN
15.0000 mL | Freq: Once | OROMUCOSAL | Status: AC
  Administered 2022-03-18: 15 mL via ORAL
  Filled 2022-03-18: qty 15

## 2022-03-18 NOTE — ED Notes (Signed)
Pt states pain is unchanged but is only present when she moves or presses on chest.

## 2022-03-18 NOTE — Discharge Instructions (Signed)
Please seek medical attention for any high fevers, chest pain, shortness of breath, change in behavior, persistent vomiting, bloody stool or any other new or concerning symptoms.  

## 2022-03-18 NOTE — ED Triage Notes (Signed)
Pt here with cp since Thurs but it getting worse. Pt states she had an aortic dissection March of last year and was tx with 3 stents. Pt states pain is centered and does radiate. Pt denies N/V/D.

## 2022-03-18 NOTE — ED Notes (Signed)
Pt to xray

## 2022-03-18 NOTE — ED Provider Notes (Signed)
[facility] Provider Note    Event Date/Time   First MD Initiated Contact with Patient 03/18/22 [phone]     (approximate)   History   Chest Pain   HPI  Angel Lane is a 72 y.o. female  who presents to the emergency department today because of concern for chest pain. Pain started two days ago. She denies any unsual exertion or activity prior to the pain starting. Located in her central lower chest. She denies radiation. Has chronic sob but no change since the pain started. Does have concern given history of aortic disease. The patient denies any leg swelling or pain. Has had two family members pass recently.      Physical Exam   Triage Vital Signs: ED Triage Vitals  Enc Vitals Group     BP 03/18/22 0809 (!) 133/97     Pulse Rate 03/18/22 0809 87     Resp 03/18/22 0809 16     Temp 03/18/22 0809 98.1 F (36.7 C)     Temp Source 03/18/22 0809 Oral     SpO2 03/18/22 0809 96 %     Weight 03/18/22 0805 221 lb 5.5 oz (100.4 kg)     Height 03/18/22 0805 '5\' 5"'$  (1.651 m)     Head Circumference --      Peak Flow --      Pain Score 03/18/22 0805 8     Pain Loc --      Pain Edu? --      Excl. in [place]? --     Most recent vital signs: Vitals:   03/18/22 0809  BP: (!) 133/97  Pulse: 87  Resp: 16  Temp: 98.1 F (36.7 C)  SpO2: 96%   General: Awake, alert, oriented. CV:  Good peripheral perfusion. Regular rate and rhythm. Resp:  Normal effort. Lungs clear. Abd:  No distention.     ED Results / Procedures / Treatments   Labs (all labs ordered are listed, but only abnormal results are displayed) Labs Reviewed  BASIC METABOLIC PANEL - Abnormal; Notable for the following components:      Result Value   Glucose, Bld 110 (*)    All other components within normal limits  HEPATIC FUNCTION PANEL - Abnormal; Notable for the following components:   Indirect Bilirubin 1.0 (*)    All other components within normal limits  CBC  LIPASE, BLOOD  TROPONIN  I (HIGH SENSITIVITY)  TROPONIN I (HIGH SENSITIVITY)     EKG  I, Nance Pear, attending physician, personally viewed and interpreted this EKG  EKG Time: 0807 Rate: 89 Rhythm: normal sinus rhythm Axis: normal Intervals: qtc 447 QRS: narrow ST changes: no st elevation Impression: normal ekg    RADIOLOGY I independently interpreted and visualized the CXR. My interpretation: No pneumonia Radiology interpretation:  IMPRESSION:  1. No active cardiopulmonary disease. No evidence of pneumonia or  pulmonary edema.  2. Aortic stents, extending from the aortic arch to the lower  descending thoracic aorta, appear stable in position and  configuration.    I independently interpreted and visualized the CT angio dissection. My interpretation: No dissection Radiology interpretation:  IMPRESSION:  1. No acute findings within the chest, abdomen or pelvis. No acute  aortic dissection or periaortic hemorrhage.  2. Aortic stents in place from the level of the aortic arch to the  lower descending thoracic aorta, stable in position and  configuration. Small residual false lumen flow at the aortic hiatus,  just below the  lower aspect of the aortic stent, is stable compared  to multiple prior studies, and with no change in the aortic diameter  at or below the level of the small residual false lumen.  3. Three-vessel coronary artery calcifications.  4. Colonic diverticulosis without evidence of acute diverticulitis.  5. Stable 8 mm pulmonary nodule within the RIGHT upper lobe, not  significantly changed compared to the earlier chest CT of 11/27/2017  indicating benignity.      PROCEDURES:  Critical Care performed: No  Procedures   MEDICATIONS ORDERED IN ED: Medications - No data to display   IMPRESSION / MDM / [place] / ED COURSE  I reviewed the triage vital signs and the nursing notes.                              Differential diagnosis includes, but is not  limited to, ACS, PE, aortic dissection, GERD  Patient's presentation is most consistent with acute presentation with potential threat to life or bodily function.  The patient is on the cardiac monitor to evaluate for evidence of arrhythmia and/or significant heart rate changes.  Patient presented to the emergency department today because of concerns for chest pain for 2 days.  On exam patient without any concerning auscultatory findings.  Patient is afebrile.  Patient does have a history of aortic disease.  Chest x-ray was negative.  Did obtain a CT angio dissection.  Did not show any acute concerning aortic findings.  Additionally no PEs.  Patient states she got some relief after GI cocktail.  At this point I do wonder if patient is suffering from heartburn/potential ulcer.  Discussed this with the patient.  Will give patient prescription for antacid and sucralfate.     FINAL CLINICAL IMPRESSION(S) / ED DIAGNOSES   Final diagnoses:  Nonspecific chest pain      Note:  This document was prepared using Dragon voice recognition software and may include unintentional dictation errors.    Nance Pear, MD 03/18/22 [phone]

## 2022-03-18 NOTE — ED Notes (Addendum)
Pt to ED with husband for central chest pain, "constant and pressure" with no radiation, since Thursday (3d). 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  States pain is worse when she stands or walks and when she pushes in on chest with hand. Pt appears to be in pain still although she says pain comes and goes. States pain is about 8/10 when it comes.  Denies SOB, dizziness, diaphoresis and inspiratory pain. Denies radiation to back. Pt is alert, oriented. EKG done. NSR on monitor.

## 2022-03-18 NOTE — ED Notes (Signed)
Pt states GI cocktail helped ease mid chest pain/ upper mid abd pain. Trops are negative. Vitals are stable. Pt ready for d/c.

## 2022-03-21 ENCOUNTER — Ambulatory Visit
Admission: RE | Admit: 2022-03-21 | Discharge: 2022-03-21 | Disposition: A | Discharge status: Home / Self Care | Payer: Federal, State, Local not specified - PPO | Source: Ambulatory Visit | Attending: Neurology | Admitting: Neurology

## 2022-03-21 ENCOUNTER — Encounter: Payer: Self-pay | Admitting: Family Medicine

## 2022-03-21 DIAGNOSIS — G459 Transient cerebral ischemic attack, unspecified: Secondary | ICD-10-CM | POA: Diagnosis present

## 2022-03-23 ENCOUNTER — Other Ambulatory Visit: Payer: Self-pay | Admitting: Family Medicine

## 2022-07-04 ENCOUNTER — Ambulatory Visit: Payer: Federal, State, Local not specified - PPO | Admitting: Gastroenterology

## 2022-07-06 ENCOUNTER — Ambulatory Visit: Payer: Federal, State, Local not specified - PPO | Admitting: Family Medicine

## 2022-07-13 ENCOUNTER — Ambulatory Visit (INDEPENDENT_AMBULATORY_CARE_PROVIDER_SITE_OTHER): Payer: Federal, State, Local not specified - PPO | Admitting: Family Medicine

## 2022-07-13 ENCOUNTER — Encounter: Payer: Self-pay | Admitting: Family Medicine

## 2022-07-13 VITALS — BP 127/78 | HR 86 | Temp 98.3°F | Resp 16 | Ht 65.0 in | Wt 225.8 lb

## 2022-07-13 DIAGNOSIS — Z6837 Body mass index (BMI) 37.0-37.9, adult: Secondary | ICD-10-CM

## 2022-07-13 DIAGNOSIS — N3942 Incontinence without sensory awareness: Secondary | ICD-10-CM

## 2022-07-13 DIAGNOSIS — I25118 Atherosclerotic heart disease of native coronary artery with other forms of angina pectoris: Secondary | ICD-10-CM

## 2022-07-13 DIAGNOSIS — K649 Unspecified hemorrhoids: Secondary | ICD-10-CM

## 2022-07-13 DIAGNOSIS — Z1231 Encounter for screening mammogram for malignant neoplasm of breast: Secondary | ICD-10-CM

## 2022-07-13 DIAGNOSIS — G4733 Obstructive sleep apnea (adult) (pediatric): Secondary | ICD-10-CM | POA: Diagnosis not present

## 2022-07-13 DIAGNOSIS — G8194 Hemiplegia, unspecified affecting left nondominant side: Secondary | ICD-10-CM

## 2022-07-13 DIAGNOSIS — E7800 Pure hypercholesterolemia, unspecified: Secondary | ICD-10-CM

## 2022-07-13 DIAGNOSIS — Z0000 Encounter for general adult medical examination without abnormal findings: Secondary | ICD-10-CM

## 2022-07-13 DIAGNOSIS — R7303 Prediabetes: Secondary | ICD-10-CM

## 2022-07-13 DIAGNOSIS — I4891 Unspecified atrial fibrillation: Secondary | ICD-10-CM

## 2022-07-13 DIAGNOSIS — I10 Essential (primary) hypertension: Secondary | ICD-10-CM | POA: Diagnosis not present

## 2022-07-13 DIAGNOSIS — I71019 Dissection of thoracic aorta, unspecified: Secondary | ICD-10-CM

## 2022-07-13 MED ORDER — HYDROCORTISONE ACETATE 25 MG RE SUPP: 25.0000 mg | Freq: Two times a day (BID) | RECTAL | 3 refills | Status: DC | PRN

## 2022-07-13 NOTE — Assessment & Plan Note (Addendum)
Well controlled in office. Followed by cardiology.  Continue current medications. Reviewed previous CMP completed on 03/18/2022

## 2022-07-13 NOTE — Assessment & Plan Note (Signed)
Chronic and stable ?History of stroke ?Likely contributes to her left lower back pain ?

## 2022-07-13 NOTE — Assessment & Plan Note (Signed)
Patient denies referral for pelvic floor therapy.

## 2022-07-13 NOTE — Assessment & Plan Note (Signed)
Chronic and well controlled.  Continue current management.  

## 2022-07-13 NOTE — Assessment & Plan Note (Signed)
Chronic. Continue lifestyle changes.  Counseled on healthy diet.

## 2022-07-13 NOTE — Progress Notes (Signed)
Complete physical exam  Patient: Angel Lane   DOB: 1950-09-20   72 y.o. Female  MRN: [id number]  Subjective:    Chief Complaint  Patient presents with   Medicare Wellness    Angel Lane is a 72 y.o. female who presents today for a complete physical exam. She reports consuming a general diet. Exercise is limited by respiratory condition(s): COPD. She generally feels well. She reports sleeping well. She does have additional problems to discuss today.   Hypertension Blood pressure is all over the place.  High at night, systolic in 130's- 160's Diastolic in the 90's. Cardiology recently increased her BP medications.  Left hip pain Had previously ordered xray to evaluate for possible arthritis. Never went to complete the X-rays.     Blurry vision 3 or 4 months ago she as not able to focus her eyes, neurologist thinks she had a TIA.  Vision is still occasionally blurry, but different from TIA episode.      Hemorrhoids  Not currently bleeding. Occasionally bleeds while wiping. Not using anything to currently manage them. Symptoms are dependent on what she eats.   Urinary incontinence Is not related to urgency or increased pelvic pressure.  Just notices underwear is wet.  Most recent fall risk assessment:    07/13/2022    2:08 PM  Fall Risk   Falls in the past year? 1  Number falls in past yr: 0  Injury with Fall? 0  Risk for fall due to : History of fall(s)  Follow up Falls evaluation completed     Most recent depression screenings:    07/13/2022    2:08 PM 03/13/2022    3:25 PM  PHQ 2/9 Scores  PHQ - 2 Score 1 0  PHQ- 9 Score 4 3        Patient Care Team: Erasmo Downer, MD as PCP - General (Family Medicine) Chilton Si, MD as PCP - Cardiology (Cardiology)   Outpatient Medications Prior to Visit  Medication Sig   acetaminophen (TYLENOL) 500 MG tablet Take 500 mg by mouth every 6 (six) hours as needed for moderate pain.   albuterol  (VENTOLIN HFA) 108 (90 Base) MCG/ACT inhaler SMARTSIG:2 inhalation Via Inhaler Every 4 Hours PRN   Budeson-Glycopyrrol-Formoterol (BREZTRI AEROSPHERE) 160-9-4.8 MCG/ACT AERO Inhale 2 puffs into the lungs 2 (two) times daily.   carvedilol (COREG) 6.25 MG tablet TAKE 1 TABLET BY MOUTH TWICE A DAY WITH FOOD   fluticasone (FLONASE) 50 MCG/ACT nasal spray Place 2 sprays into both nostrils daily.   hydrochlorothiazide (HYDRODIURIL) 25 MG tablet Take 25 mg by mouth daily.   losartan (COZAAR) 50 MG tablet Take 1 tablet by mouth daily.   rosuvastatin (CRESTOR) 5 MG tablet TAKE 1 TABLET (5 MG TOTAL) BY MOUTH DAILY.   sucralfate (CARAFATE) 1 g tablet Take 1 tablet (1 g total) by mouth 4 (four) times daily.   [DISCONTINUED] famotidine (PEPCID) 20 MG tablet Take 1 tablet (20 mg total) by mouth daily. (Patient not taking: Reported on 07/13/2022)   [DISCONTINUED] TRELEGY ELLIPTA 100-62.5-25 MCG/ACT AEPB Inhale 1 puff into the lungs daily in the afternoon. (Patient not taking: Reported on 07/13/2022)   No facility-administered medications prior to visit.    Review of Systems  Constitutional:  Positive for malaise/fatigue.  HENT:         Sinus pressure  Eyes:  Positive for blurred vision. Negative for double vision, pain and discharge.  Respiratory:  Positive for shortness of breath and wheezing.  Cardiovascular:  Negative for chest pain.  Gastrointestinal:  Negative for constipation.       Anal bleeding   Musculoskeletal:  Positive for back pain.  Neurological:  Negative for headaches.  Endo/Heme/Allergies:  Positive for environmental allergies.          Objective:     BP 127/78 (BP Location: Left Arm, Patient Position: Sitting, Cuff Size: Large)   Pulse 86   Temp 98.3 F (36.8 C) (Temporal)   Resp 16   Ht 5\' 5"  (1.651 m)   Wt 225 lb 12.8 oz (102.4 kg)   SpO2 95%   BMI 37.58 kg/m    Physical Exam Constitutional:      General: She is not in acute distress. HENT:     Head: Normocephalic  and atraumatic.  Eyes:     Conjunctiva/sclera: Conjunctivae normal.     Pupils: Pupils are equal, round, and reactive to light.  Cardiovascular:     Pulses: Normal pulses.     Heart sounds: Normal heart sounds.  Pulmonary:     Effort: Pulmonary effort is normal.     Breath sounds: Normal breath sounds.  Abdominal:     General: Bowel sounds are normal. There is no distension.     Palpations: Abdomen is soft. There is no mass.     Tenderness: There is no abdominal tenderness.  Musculoskeletal:     Cervical back: Normal range of motion.     Right lower leg: No edema.     Left lower leg: No edema.  Skin:    General: Skin is warm and dry.  Neurological:     Mental Status: She is alert.      No results found for any visits on 07/13/22.     Assessment & Plan:    Routine Health Maintenance and Physical Exam  Immunization History  Administered Date(s) Administered   PFIZER Comirnaty(Gray Top)Covid-19 Tri-Sucrose Vaccine 04/17/2020   PFIZER(Purple Top)SARS-COV-2 Vaccination 09/24/2019, 10/15/2019   Pneumococcal Conjugate-13 03/23/2017   Pneumococcal Polysaccharide-23 05/29/2019   Tdap 10/08/2017    Health Maintenance  Topic Date Due   Zoster Vaccines- Shingrix (1 of 2) Never done   MAMMOGRAM  02/22/2020   COVID-19 Vaccine (4 - 2023-24 season) 11/04/2021   INFLUENZA VACCINE  10/05/2022   Lung Cancer Screening  03/19/2023   DTaP/Tdap/Td (2 - Td or Tdap) 10/09/2027   COLONOSCOPY (Pts 45-110yrs Insurance coverage will need to be confirmed)  01/20/2030   Pneumonia Vaccine 68+ Years old  Completed   DEXA SCAN  Completed   Hepatitis C Screening  Completed   HPV VACCINES  Aged Out    Discussed health benefits of physical activity, and encouraged her to engage in regular exercise appropriate for her age and condition.  Problem List Items Addressed This Visit     Left hemiparesis (HCC)    Chronic and stable History of stroke Likely contributes to her left lower back pain       HTN (hypertension)    Well controlled in office. Followed by cardiology.  Continue current medications. Reviewed previous CMP completed on 03/18/2022      Relevant Medications   losartan (COZAAR) 50 MG tablet   Pure hypercholesterolemia    Chronic and well controlled. Continue current medications. Ordered lipid panel.       Relevant Medications   losartan (COZAAR) 50 MG tablet   Other Relevant Orders   Lipid panel   Hyperglycemia    Chronic and well controlled. Continue management with lifestyle changes.  Ordered Hgb A1c.      OSA on CPAP    Chronic and well controlled. Continue current management.       RESOLVED: Breast cancer screening by mammogram   Relevant Orders   MM 3D SCREENING MAMMOGRAM BILATERAL BREAST   Thoracic aortic dissection (HCC)    S/p VVS repair Encouraged patient to continue to follow with vascular surgery and get her routine imaging to continue to monitor      Relevant Medications   losartan (COZAAR) 50 MG tablet   Flutter-fibrillation (HCC)    F/b Cardiology      Relevant Medications   losartan (COZAAR) 50 MG tablet   Obesity    Chronic. Continue lifestyle changes.  Counseled on healthy diet.       Coronary artery disease of native artery of native heart with stable angina pectoris (HCC)    Chronic and stable Continue to follow with cardiology      Relevant Medications   losartan (COZAAR) 50 MG tablet   Urinary incontinence without sensory awareness    Patient denies referral for pelvic floor therapy.       Other Visit Diagnoses     Encounter for annual physical exam    -  Primary   Relevant Orders   Hemoglobin A1c   Lipid panel   Hemorrhoids, unspecified hemorrhoid type     Reoccurring and in current flare. Ordered hydrocortisone 25 mg suppository.     Relevant Medications   losartan (COZAAR) 50 MG tablet      Return in about 6 months (around 01/13/2023) for chronic disease f/u.     Gilmer Mor, Medical  Student   Patient seen along with MS3 student Angel Lane. I personally evaluated this patient along with the student, and verified all aspects of the history, physical exam, and medical decision making as documented by the student. I agree with the student's documentation and have made all necessary edits.  Nevena Rozenberg, Marzella Schlein, MD, MPH [facility]

## 2022-07-13 NOTE — Assessment & Plan Note (Signed)
Chronic and well controlled. Continue management with lifestyle changes. Ordered Hgb A1c.

## 2022-07-13 NOTE — Assessment & Plan Note (Signed)
Chronic and stable  Continue to follow with cardiology

## 2022-07-13 NOTE — Assessment & Plan Note (Signed)
F/b Cardiology 

## 2022-07-13 NOTE — Assessment & Plan Note (Signed)
S/p VVS repair ?Encouraged patient to continue to follow with vascular surgery and get her routine imaging to continue to monitor ?

## 2022-07-13 NOTE — Assessment & Plan Note (Signed)
Chronic and well controlled. Continue current medications. Ordered lipid panel.

## 2022-07-14 LAB — LIPID PANEL
Chol/HDL Ratio: 3.1 ratio (ref 0.0–4.4)
Cholesterol, Total: 168 mg/dL (ref 100–199)
HDL: 55 mg/dL (ref >39)
LDL Chol Calc (NIH): 98 mg/dL (ref 0–99)
Triglycerides: 79 mg/dL (ref 0–149)
VLDL Cholesterol Cal: 15 mg/dL (ref 5–40)

## 2022-07-14 LAB — HEMOGLOBIN A1C
Est. average glucose Bld gHb Est-mCnc: 120 mg/dL
Hgb A1c MFr Bld: 5.8 % — ABNORMAL HIGH (ref 4.8–5.6)

## 2022-08-28 ENCOUNTER — Encounter: Payer: Self-pay | Admitting: Family Medicine

## 2022-08-31 MED ORDER — ASPIRIN 81 MG PO TBEC: 81.0000 mg | DELAYED_RELEASE_TABLET | Freq: Every day | ORAL | 12 refills | Status: AC

## 2022-09-05 NOTE — Telephone Encounter (Signed)
I sent it. They may not cover it since it is OTC

## 2022-09-28 ENCOUNTER — Ambulatory Visit
Admission: RE | Admit: 2022-09-28 | Discharge: 2022-09-28 | Disposition: A | Discharge status: Home / Self Care | Payer: Federal, State, Local not specified - PPO | Source: Ambulatory Visit | Attending: Family Medicine | Admitting: Family Medicine

## 2022-09-28 DIAGNOSIS — Z1231 Encounter for screening mammogram for malignant neoplasm of breast: Secondary | ICD-10-CM | POA: Diagnosis present

## 2022-11-21 ENCOUNTER — Ambulatory Visit (INDEPENDENT_AMBULATORY_CARE_PROVIDER_SITE_OTHER): Payer: Federal, State, Local not specified - PPO | Admitting: Family Medicine

## 2022-11-21 ENCOUNTER — Encounter: Payer: Self-pay | Admitting: Family Medicine

## 2022-11-21 VITALS — BP 135/77 | HR 88 | Ht 65.0 in | Wt 225.8 lb

## 2022-11-21 DIAGNOSIS — R1011 Right upper quadrant pain: Secondary | ICD-10-CM

## 2022-11-21 NOTE — Progress Notes (Signed)
Acute Office Visit  Subjective:     Patient ID: Angel Lane, female    DOB: 1950-08-23, 72 y.o.   MRN: [id number]  Chief Complaint  Patient presents with   Medical Management of Chronic Issues    Pain located on the right side of abdomen near the previous gall bladder surgery was performed, Pain has been going on for about 1 week, Pain came suddenly, prior treatment has been tylenol     HPI Discussed the use of AI scribe software for clinical note transcription with the patient, who gave verbal consent to proceed.  History of Present Illness   The patient, with a past medical history of gallbladder removal and aortic dissection, presents with intermittent right upper quadrant abdominal pain. The pain, described as a stabbing sensation, is located near the scar from their previous gallbladder surgery. The pain is not constant and does not appear to be associated with eating or specific movements, but sometimes worsens when the patient leans over. Over-the-counter Tylenol provides some relief. The patient denies any associated nausea, vomiting, or changes in bowel habits. They did note one instance of rectal bleeding, which they attributed to hemorrhoids, and have since had a normal bowel movement without bleeding.  In addition to the abdominal pain, the patient expresses concern about fluctuating blood pressure readings. They have been monitoring their blood pressure at home and have noticed some high readings, although many readings are within normal limits. The patient is currently on losartan for blood pressure management. They also mention experiencing shortness of breath when walking short distances, such as from their deck to their shed. The patient has a history of coronary artery disease and has had a valve replacement. They are currently using a CPAP machine for sleep, which they report has been helping.       ROS per HPI      Objective:    BP 135/77 (BP Location: Left  Arm, Patient Position: Sitting, Cuff Size: Large)   Pulse 88   Ht 5\' 5"  (1.651 m)   Wt 225 lb 12.8 oz (102.4 kg)   SpO2 100%   BMI 37.58 kg/m    Physical Exam Vitals reviewed.  Constitutional:      General: She is not in acute distress.    Appearance: Normal appearance. She is well-developed. She is not diaphoretic.  HENT:     Head: Normocephalic and atraumatic.  Eyes:     General: No scleral icterus.    Conjunctiva/sclera: Conjunctivae normal.  Neck:     Thyroid: No thyromegaly.  Cardiovascular:     Rate and Rhythm: Normal rate and regular rhythm.     Heart sounds: Normal heart sounds. No murmur heard. Pulmonary:     Effort: Pulmonary effort is normal. No respiratory distress.     Breath sounds: Normal breath sounds. No wheezing, rhonchi or rales.  Abdominal:     General: Bowel sounds are normal. There is no distension.     Palpations: Abdomen is soft.     Tenderness: There is abdominal tenderness (along previous incision, pain with abd muscle contraction). There is no guarding or rebound.  Musculoskeletal:     Cervical back: Neck supple.     Right lower leg: No edema.     Left lower leg: No edema.  Lymphadenopathy:     Cervical: No cervical adenopathy.  Skin:    General: Skin is warm and dry.     Findings: No rash.  Neurological:     Mental  Status: She is alert and oriented to person, place, and time. Mental status is at baseline.  Psychiatric:        Mood and Affect: Mood normal.        Behavior: Behavior normal.     No results found for any visits on 11/21/22.      Assessment & Plan:   Problem List Items Addressed This Visit   None Visit Diagnoses     RUQ pain    -  Primary   Relevant Orders   CT ABDOMEN W WO CONTRAST           Right Upper Quadrant Pain Intermittent stabbing pain in the right upper quadrant for one week. No associated nausea, vomiting, or changes in bowel movements. Pain is positional and relieved by Tylenol. History of  gallbladder removal in the 1980s. Physical exam suggestive of possible hernia. -Order CT scan with contrast to evaluate for possible hernia and other causes of pain.  Hypertension Patient reports fluctuating blood pressure readings at home, currently managed with Losartan. No symptoms of hypotension reported. -Continue current management and monitor blood pressure readings at home.  Lower Back Pain Pain radiating down to the leg, possibly sciatica or related to arthritis. -Continue current management and monitor symptoms.  Rectal Bleeding One episode of rectal bleeding, likely related to known hemorrhoids. No constipation reported.  Normal BMs since that time. -Continue current management and monitor symptoms.  Follow-up in two months for regular appointment.        No orders of the defined types were placed in this encounter.   Return in about 2 months (around 01/21/2023) for chronic disease f/u.  Shirlee Latch, MD

## 2022-11-27 ENCOUNTER — Ambulatory Visit
Admission: RE | Admit: 2022-11-27 | Discharge: 2022-11-27 | Disposition: A | Discharge status: Home / Self Care | Payer: Federal, State, Local not specified - PPO | Source: Ambulatory Visit | Attending: Family Medicine | Admitting: Family Medicine

## 2022-11-27 DIAGNOSIS — R1011 Right upper quadrant pain: Secondary | ICD-10-CM | POA: Diagnosis present

## 2022-11-27 LAB — POCT I-STAT CREATININE: Creatinine, Ser: 0.9 mg/dL (ref 0.44–1.00)

## 2022-11-27 MED ORDER — IOHEXOL 300 MG/ML  SOLN
100.0000 mL | Freq: Once | INTRAMUSCULAR | Status: AC | PRN
  Administered 2022-11-27: 100 mL via INTRAVENOUS

## 2022-11-29 ENCOUNTER — Telehealth: Payer: Self-pay | Admitting: Family Medicine

## 2022-11-29 NOTE — Telephone Encounter (Signed)
Patient aware.

## 2022-11-29 NOTE — Telephone Encounter (Signed)
Pt is calling in wanting to know the results of her CT scan. Pt says she is in pain and wants to know what is going on. Pt wants to know is there any way to expedite the results. Pt is requesting a call back from the office.

## 2022-11-29 NOTE — Telephone Encounter (Signed)
Per [facility] radiology department the doctors are behind.

## 2022-12-04 ENCOUNTER — Encounter: Payer: Self-pay | Admitting: Vascular Surgery

## 2022-12-04 ENCOUNTER — Encounter: Payer: Self-pay | Admitting: Family Medicine

## 2022-12-25 ENCOUNTER — Ambulatory Visit
Admission: RE | Admit: 2022-12-25 | Discharge: 2022-12-25 | Disposition: A | Discharge status: Home / Self Care | Payer: Federal, State, Local not specified - PPO | Source: Ambulatory Visit | Attending: Family Medicine

## 2022-12-25 ENCOUNTER — Ambulatory Visit
Admission: RE | Admit: 2022-12-25 | Discharge: 2022-12-25 | Disposition: A | Discharge status: Home / Self Care | Payer: Federal, State, Local not specified - PPO | Attending: Family Medicine | Admitting: Family Medicine

## 2022-12-25 DIAGNOSIS — M25552 Pain in left hip: Secondary | ICD-10-CM | POA: Diagnosis present

## 2022-12-27 ENCOUNTER — Telehealth: Payer: Self-pay | Admitting: Family Medicine

## 2022-12-27 NOTE — Telephone Encounter (Signed)
No results. Will called radiology department tomorrow.

## 2022-12-27 NOTE — Telephone Encounter (Signed)
Patient has called in for her results from an XRAY she had done on Monday, 10.21.2024. Please follow back up with paient @ # [phone].

## 2022-12-29 NOTE — Telephone Encounter (Signed)
Called and requested report.

## 2023-01-22 ENCOUNTER — Encounter: Payer: Self-pay | Admitting: Family Medicine

## 2023-01-22 ENCOUNTER — Ambulatory Visit: Payer: Federal, State, Local not specified - PPO | Admitting: Family Medicine

## 2023-01-22 VITALS — BP 134/84 | HR 79 | Ht 65.0 in | Wt 220.0 lb

## 2023-01-22 DIAGNOSIS — E66812 Obesity, class 2: Secondary | ICD-10-CM | POA: Diagnosis not present

## 2023-01-22 DIAGNOSIS — E7800 Pure hypercholesterolemia, unspecified: Secondary | ICD-10-CM | POA: Diagnosis not present

## 2023-01-22 DIAGNOSIS — R7303 Prediabetes: Secondary | ICD-10-CM | POA: Insufficient documentation

## 2023-01-22 DIAGNOSIS — I10 Essential (primary) hypertension: Secondary | ICD-10-CM

## 2023-01-22 DIAGNOSIS — I25118 Atherosclerotic heart disease of native coronary artery with other forms of angina pectoris: Secondary | ICD-10-CM | POA: Diagnosis not present

## 2023-01-22 DIAGNOSIS — G4733 Obstructive sleep apnea (adult) (pediatric): Secondary | ICD-10-CM

## 2023-01-22 DIAGNOSIS — Z6837 Body mass index (BMI) 37.0-37.9, adult: Secondary | ICD-10-CM

## 2023-01-22 NOTE — Assessment & Plan Note (Signed)
Previously well controlled Continue statin Repeat FLP and CMP Goal LDL < 70 

## 2023-01-22 NOTE — Assessment & Plan Note (Signed)
Well-controlled on amlodipine 5 mg daily after switching from losartan due to cramps. - Continue amlodipine 5 mg daily - Continue hydrochlorothiazide 25 mg daily - Monitor blood pressure regularly

## 2023-01-22 NOTE — Assessment & Plan Note (Signed)
Family history of diabetes. Monitoring A1c levels to prevent progression to diabetes. - Order A1c test - Monitor blood glucose levels

## 2023-01-22 NOTE — Assessment & Plan Note (Signed)
Discussed importance of healthy weight management Discussed diet and exercise  

## 2023-01-22 NOTE — Progress Notes (Signed)
Established Patient Office Visit  Subjective   Patient ID: Angel Lane, female    DOB: 10/26/1950  Age: 72 y.o. MRN: [id number]  Chief Complaint  Patient presents with   Medical Management of Chronic Issues    6 month follow-up. Patient reports palpitations due to rx Sherrye Payor) pulmonology provided.     HPI  Discussed the use of AI scribe software for clinical note transcription with the patient, who gave verbal consent to proceed.  History of Present Illness   The patient, with a history of arthritis, sleep apnea, and hypertension, presents for a follow-up visit. They express concern about their CPAP machine, which is currently set at nine. The patient reports feeling breathless upon waking up and has to use an inhaler to alleviate the discomfort in their chest. They have discussed this issue with their pulmonologist, but the discomfort persists.  In addition to the CPAP machine concerns, the patient also reports a troubling sensation in their neck, which they attribute to a past surgery. They describe it as a pulling sensation, which is not currently present but tends to be tender.  The patient also mentions a recent fall, which resulted in a minor cut on their hand but no significant injuries. They have been experiencing pain around their rib cage, particularly when coughing or laughing, which they suspect might be due to a pulled muscle.  Lastly, the patient mentions that they have been switched from losartan to amlodipine for their blood pressure by their cardiologist. They also report having cramps in their feet and toes.         ROS    Objective:     BP 134/84 (BP Location: Left Arm, Patient Position: Sitting, Cuff Size: Large)   Pulse 79   Ht 5\' 5"  (1.651 m)   Wt 220 lb (99.8 kg)   BMI 36.61 kg/m    Physical Exam Vitals reviewed.  Constitutional:      General: She is not in acute distress.    Appearance: Normal appearance. She is well-developed. She is not  diaphoretic.  HENT:     Head: Normocephalic and atraumatic.  Eyes:     General: No scleral icterus.    Conjunctiva/sclera: Conjunctivae normal.  Neck:     Thyroid: No thyromegaly.  Cardiovascular:     Rate and Rhythm: Normal rate and regular rhythm.     Heart sounds: Normal heart sounds.  Pulmonary:     Effort: Pulmonary effort is normal. No respiratory distress.     Breath sounds: Normal breath sounds. No wheezing, rhonchi or rales.  Musculoskeletal:     Cervical back: Neck supple.     Right lower leg: No edema.     Left lower leg: No edema.  Lymphadenopathy:     Cervical: No cervical adenopathy.  Skin:    General: Skin is warm and dry.     Findings: No rash.  Neurological:     Mental Status: She is alert and oriented to person, place, and time. Mental status is at baseline.  Psychiatric:        Mood and Affect: Mood normal.        Behavior: Behavior normal.      No results found for any visits on 01/22/23.    The ASCVD Risk score (Arnett DK, et al., 2019) failed to calculate for the following reasons:   The patient has a prior MI or stroke diagnosis    Assessment & Plan:   Problem List Items Addressed This  Visit       Cardiovascular and Mediastinum   HTN (hypertension) - Primary    Well-controlled on amlodipine 5 mg daily after switching from losartan due to cramps. - Continue amlodipine 5 mg daily - Continue hydrochlorothiazide 25 mg daily - Monitor blood pressure regularly      Relevant Medications   amLODipine (NORVASC) 5 MG tablet   Other Relevant Orders   Comprehensive metabolic panel   Coronary artery disease of native artery of native heart with stable angina pectoris (HCC)    Chronic and stable Continue to follow with cardiology      Relevant Medications   amLODipine (NORVASC) 5 MG tablet     Respiratory   OSA on CPAP    Severe OSA with an AHI of 62.1, worse during REM sleep (AHI up to 96/hour). Current CPAP at 9 cm H2O, but reports  shortness of breath upon waking. Previous titration study recommended 12 cm H2O. Discussed potential benefits of auto-titrating CPAP for improved comfort and efficacy. - Contact pulmonologist to discuss auto-titrating CPAP - Document concerns and current CPAP settings        Other   Pure hypercholesterolemia    Previously well controlled Continue statin Repeat FLP and CMP Goal LDL < 70      Relevant Medications   amLODipine (NORVASC) 5 MG tablet   Other Relevant Orders   Comprehensive metabolic panel   Lipid panel   Obesity    Discussed importance of healthy weight management Discussed diet and exercise       Prediabetes    Family history of diabetes. Monitoring A1c levels to prevent progression to diabetes. - Order A1c test - Monitor blood glucose levels      Relevant Orders   Hemoglobin A1c        Hip Arthritis Chronic hip arthritis confirmed by x-ray with ongoing symptoms. - Monitor symptoms - Consider further imaging if symptoms worsen  Musculoskeletal Pain Pain along the rib cage exacerbated by coughing and laughing, likely muscular in origin, possibly related to a recent fall. - conservative management - heating pad, rest, gentle stretching  Post-Surgical Nerve Pain Persistent neck tenderness and numbness in fingers post-surgery, likely nerve-related. Surgeon indicated nerve injuries may take 6-12 months to resolve or may be permanent. - Monitor symptoms - Discuss with surgeon if symptoms persist or worsen  General Health Maintenance Due for routine blood work including kidney and liver function tests, cholesterol, and A1c. Declined flu shot. - Order blood tests for kidney and liver function, cholesterol, and A1c - Offer flu shot at next visit  Follow-up - Schedule follow-up appointment in six months - Ensure contact with pulmonologist regarding auto-titrating CPAP and lung cancer screening.        Return in about 6 months (around 07/22/2023) for  CPE.    Shirlee Latch, MD

## 2023-01-22 NOTE — Assessment & Plan Note (Signed)
Chronic and stable  Continue to follow with cardiology

## 2023-01-22 NOTE — Assessment & Plan Note (Signed)
Severe OSA with an AHI of 62.1, worse during REM sleep (AHI up to 96/hour). Current CPAP at 9 cm H2O, but reports shortness of breath upon waking. Previous titration study recommended 12 cm H2O. Discussed potential benefits of auto-titrating CPAP for improved comfort and efficacy. - Contact pulmonologist to discuss auto-titrating CPAP - Document concerns and current CPAP settings

## 2023-01-23 LAB — COMPREHENSIVE METABOLIC PANEL
ALT: 41 [IU]/L — ABNORMAL HIGH (ref 0–32)
AST: 26 [IU]/L (ref 0–40)
Albumin: 4 g/dL (ref 3.8–4.8)
Alkaline Phosphatase: 104 [IU]/L (ref 44–121)
BUN/Creatinine Ratio: 18 (ref 12–28)
BUN: 14 mg/dL (ref 8–27)
Bilirubin Total: 0.6 mg/dL (ref 0.0–1.2)
CO2: 26 mmol/L (ref 20–29)
Calcium: 10.1 mg/dL (ref 8.7–10.3)
Chloride: 100 mmol/L (ref 96–106)
Creatinine, Ser: 0.79 mg/dL (ref 0.57–1.00)
Globulin, Total: 2.6 g/dL (ref 1.5–4.5)
Glucose: 91 mg/dL (ref 70–99)
Potassium: 4.3 mmol/L (ref 3.5–5.2)
Sodium: 140 mmol/L (ref 134–144)
Total Protein: 6.6 g/dL (ref 6.0–8.5)
eGFR: 79 mL/min/{1.73_m2} (ref >59)

## 2023-01-23 LAB — LIPID PANEL
Chol/HDL Ratio: 3 ratio (ref 0.0–4.4)
Cholesterol, Total: 164 mg/dL (ref 100–199)
HDL: 54 mg/dL (ref >39)
LDL Chol Calc (NIH): 91 mg/dL (ref 0–99)
Triglycerides: 102 mg/dL (ref 0–149)
VLDL Cholesterol Cal: 19 mg/dL (ref 5–40)

## 2023-01-23 LAB — HEMOGLOBIN A1C
Est. average glucose Bld gHb Est-mCnc: 123 mg/dL
Hgb A1c MFr Bld: 5.9 % — ABNORMAL HIGH (ref 4.8–5.6)

## 2023-03-28 ENCOUNTER — Encounter: Payer: Self-pay | Admitting: Family Medicine

## 2023-03-29 ENCOUNTER — Encounter: Payer: Self-pay | Admitting: Family Medicine

## 2023-03-29 NOTE — Telephone Encounter (Signed)
Replied back in another mychaert message appointment needed

## 2023-03-30 ENCOUNTER — Emergency Department: Payer: Medicare Other

## 2023-03-30 ENCOUNTER — Other Ambulatory Visit: Payer: Self-pay

## 2023-03-30 ENCOUNTER — Emergency Department
Admission: EM | Admit: 2023-03-30 | Discharge: 2023-03-30 | Disposition: A | Discharge status: Home / Self Care | Payer: Medicare Other | Attending: Emergency Medicine | Admitting: Emergency Medicine

## 2023-03-30 DIAGNOSIS — Z1152 Encounter for screening for COVID-19: Secondary | ICD-10-CM | POA: Diagnosis not present

## 2023-03-30 DIAGNOSIS — J441 Chronic obstructive pulmonary disease with (acute) exacerbation: Secondary | ICD-10-CM | POA: Insufficient documentation

## 2023-03-30 LAB — CBC WITH DIFFERENTIAL/PLATELET
Abs Immature Granulocytes: 0.03 10*3/uL (ref 0.00–0.07)
Basophils Absolute: 0 10*3/uL (ref 0.0–0.1)
Basophils Relative: 0 %
Eosinophils Absolute: 0.1 10*3/uL (ref 0.0–0.5)
Eosinophils Relative: 1 %
HCT: 41.6 % (ref 36.0–46.0)
Hemoglobin: 13.4 g/dL (ref 12.0–15.0)
Immature Granulocytes: 0 %
Lymphocytes Relative: 23 %
Lymphs Abs: 1.6 10*3/uL (ref 0.7–4.0)
MCH: 29.8 pg (ref 26.0–34.0)
MCHC: 32.2 g/dL (ref 30.0–36.0)
MCV: 92.7 fL (ref 80.0–100.0)
Monocytes Absolute: 0.7 10*3/uL (ref 0.1–1.0)
Monocytes Relative: 10 %
Neutro Abs: 4.5 10*3/uL (ref 1.7–7.7)
Neutrophils Relative %: 66 %
Platelets: 189 10*3/uL (ref 150–400)
RBC: 4.49 MIL/uL (ref 3.87–5.11)
RDW: 12.8 % (ref 11.5–15.5)
WBC: 6.9 10*3/uL (ref 4.0–10.5)
nRBC: 0 % (ref 0.0–0.2)

## 2023-03-30 LAB — BASIC METABOLIC PANEL
Anion gap: 11 (ref 5–15)
BUN: 16 mg/dL (ref 8–23)
CO2: 26 mmol/L (ref 22–32)
Calcium: 9.4 mg/dL (ref 8.9–10.3)
Chloride: 104 mmol/L (ref 98–111)
Creatinine, Ser: 0.83 mg/dL (ref 0.44–1.00)
GFR, Estimated: >60 mL/min (ref >60)
Glucose, Bld: 109 mg/dL — ABNORMAL HIGH (ref 70–99)
Potassium: 3.8 mmol/L (ref 3.5–5.1)
Sodium: 141 mmol/L (ref 135–145)

## 2023-03-30 LAB — RESP PANEL BY RT-PCR (RSV, FLU A&B, COVID)  RVPGX2
Influenza A by PCR: NEGATIVE
Influenza B by PCR: NEGATIVE
Resp Syncytial Virus by PCR: NEGATIVE
SARS Coronavirus 2 by RT PCR: NEGATIVE

## 2023-03-30 LAB — TROPONIN I (HIGH SENSITIVITY): Troponin I (High Sensitivity): 6 ng/L (ref <18)

## 2023-03-30 MED ORDER — PREDNISONE 20 MG PO TABS
60.0000 mg | ORAL_TABLET | Freq: Once | ORAL | Status: AC
  Administered 2023-03-30: 60 mg via ORAL
  Filled 2023-03-30: qty 3

## 2023-03-30 MED ORDER — IPRATROPIUM-ALBUTEROL 0.5-2.5 (3) MG/3ML IN SOLN
3.0000 mL | Freq: Once | RESPIRATORY_TRACT | Status: AC
  Administered 2023-03-30: 3 mL via RESPIRATORY_TRACT
  Filled 2023-03-30: qty 3

## 2023-03-30 MED ORDER — PREDNISONE 20 MG PO TABS: 40.0000 mg | ORAL_TABLET | Freq: Every day | ORAL | 0 refills | Status: AC

## 2023-03-30 MED ORDER — CEFUROXIME AXETIL 500 MG PO TABS: 500.0000 mg | ORAL_TABLET | Freq: Two times a day (BID) | ORAL | 0 refills | Status: AC

## 2023-03-30 MED ORDER — BENZONATATE 100 MG PO CAPS: 100.0000 mg | ORAL_CAPSULE | Freq: Three times a day (TID) | ORAL | 0 refills | Status: DC | PRN

## 2023-03-30 MED ORDER — DOXYCYCLINE HYCLATE 100 MG PO TABS: 100.0000 mg | ORAL_TABLET | Freq: Two times a day (BID) | ORAL | 0 refills | Status: AC

## 2023-03-30 NOTE — ED Provider Notes (Signed)
[facility] Provider Note    Event Date/Time   First MD Initiated Contact with Patient 03/30/23 [phone]     (approximate)   History   No chief complaint on file.   HPI  Angel Lane is a 73 y.o. female with history of COPD who comes in with concerning for a cough.  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  She has a history of a prior stroke.  I reviewed the pulmonology note from 01/25/2023.  She uses CPAP at nighttime for sleep apnea. Patient reports over the past 4 days she has been feeling sick.  She states that initially started off as a little bit of a sore throat then traveled into her sinuses and now down to her chest.  She does report history of COPD says she has had a little bit of shortness of breath, cough coughing up mucus associated with it.  She reports using her inhalers but still not feeling well.  Denies any significant chest pain.  She reports more of upper side pain with coughing.     Physical Exam   Triage Vital Signs: ED Triage Vitals  Encounter Vitals Group     BP 03/30/23 0849 138/85     Systolic BP Percentile --      Diastolic BP Percentile --      Pulse Rate 03/30/23 0849 91     Resp 03/30/23 0849 19     Temp 03/30/23 0849 98.8 F (37.1 C)     Temp src --      SpO2 03/30/23 0849 95 %     Weight 03/30/23 0848 219 lb (99.3 kg)     Height 03/30/23 0848 5\' 5"  (1.651 m)     Head Circumference --      Peak Flow --      Pain Score 03/30/23 0848 8     Pain Loc --      Pain Education --      Exclude from Growth Chart --     Most recent vital signs: Vitals:   03/30/23 0849  BP: 138/85  Pulse: 91  Resp: 19  Temp: 98.8 F (37.1 C)  SpO2: 95%     General: Awake, no distress.  CV:  Good peripheral perfusion.  Resp:  Normal effort.  Mild wheeze noted bilaterally. Abd:  No distention.  Other:  No swelling   ED Results / Procedures / Treatments   Labs (all labs ordered are  listed, but only abnormal results are displayed) Labs Reviewed  RESP PANEL BY RT-PCR (RSV, FLU A&B, COVID)  RVPGX2  URINALYSIS, ROUTINE W REFLEX MICROSCOPIC     EKG  My interpretation of EKG:  Normal sinus rate of 85 without any ST elevation or T wave inversions other than V2 aVL, QTc of 492  RADIOLOGY I have reviewed the xray personally and interpreted and no evidence of any pneumonia    PROCEDURES:  Critical Care performed: No  Procedures   MEDICATIONS ORDERED IN ED: Medications  ipratropium-albuterol (DUONEB) 0.5-2.5 (3) MG/3ML nebulizer solution 3 mL (has no administration in time range)  predniSONE (DELTASONE) tablet 60 mg (has no administration in time range)     IMPRESSION / MDM / ASSESSMENT AND PLAN / ED COURSE  I reviewed the triage vital signs and the nursing notes.   Patient's presentation is most consistent with severe exacerbation of chronic illness.   Patient comes in with concern patient comes in with concerns  for shortness of breath, cough, congestion.  Suspect this is related to her COPD.  Will test for COVID, flu chest x-ray to evaluate for pneumothorax.  Patient well-appearing no accessory muscle use but does have have some wheezing so given a DuoNeb, steroids.  Basic labs ordered evaluate for ACS, anemia but this seems less likely.  If workup is reassuring we discussed treatment for COPD exacerbation with steroids, antibiotics.  Patient expressed understanding and felt comfortable with this plan.  CBC is reassuring Troponin negative.  BMP reassuring  Patient is abdomen soft and nontender low suspicion for acute abdominal process.  She reports that her pain is on her back sides only when she coughs.  She states that she is already had her gallbladder removed.  Do not feel there be utility in adding on LFTs, lipase at this time given abdomen is soft and nontender.  Patient is UA is still pending.  Patient denies any urinary symptoms.  She is okay with  going home without this.  Patient's repeat oxygen levels 98% she is resting comfortably in bed reports feeling better.  Will send home with steroids, antibiotics for COPD exacerbation, Tessalon Perles.  Patient expressed understanding felt comfortable with discharge plan  FINAL CLINICAL IMPRESSION(S) / ED DIAGNOSES   Final diagnoses:  COPD with acute exacerbation (HCC)     Rx / DC Orders   ED Discharge Orders          Ordered    predniSONE (DELTASONE) 20 MG tablet  Daily with breakfast        03/30/23 1103    doxycycline (VIBRA-TABS) 100 MG tablet  2 times daily        03/30/23 1103    cefUROXime (CEFTIN) 500 MG tablet  2 times daily with meals        03/30/23 1103    benzonatate (TESSALON PERLES) 100 MG capsule  3 times daily PRN        03/30/23 1105             Note:  This document was prepared using Dragon voice recognition software and may include unintentional dictation errors.   Concha Se, MD 03/30/23 [phone]

## 2023-03-30 NOTE — Discharge Instructions (Addendum)
Return to the ER for worsening symptoms or any other concerns.  At this time we will cover you for potential pneumonia that is being not seen on x-ray.  Ibuprofen 600 every 8 hours. Can occasionally use tylenol as well but with your history of fatty liver try to limit use.

## 2023-03-30 NOTE — ED Triage Notes (Addendum)
Pt comes with cough and congestion for about 3 days. Pt denies any fevers. Pt states she does have copd. Pt states productive cough with yellow mucus. Pt states lower back pain also. Pt denies any urinary symptoms.

## 2023-03-30 NOTE — ED Notes (Signed)
See triage note  Presents with chest congestion  and cough  Sxs' started couple of days   Denies any fever  States cough is productive  Yellowish phlegm

## 2023-04-05 ENCOUNTER — Ambulatory Visit: Payer: Federal, State, Local not specified - PPO | Admitting: Family Medicine

## 2023-04-05 ENCOUNTER — Encounter: Payer: Self-pay | Admitting: Family Medicine

## 2023-04-05 VITALS — BP 150/78 | HR 81 | Ht 65.0 in | Wt 217.3 lb

## 2023-04-05 DIAGNOSIS — J441 Chronic obstructive pulmonary disease with (acute) exacerbation: Secondary | ICD-10-CM

## 2023-04-05 MED ORDER — PREDNISONE 20 MG PO TABS: 40.0000 mg | ORAL_TABLET | Freq: Every day | ORAL | 0 refills | Status: AC

## 2023-04-05 MED ORDER — GUAIFENESIN-CODEINE 100-10 MG/5ML PO SOLN: 10.0000 mL | Freq: Three times a day (TID) | ORAL | 0 refills | Status: DC | PRN

## 2023-04-05 NOTE — Progress Notes (Signed)
Established patient visit   Patient: Angel Lane   DOB: September 10, 1950   73 y.o. Female  MRN: [id number] Visit Date: 04/05/2023  Today's healthcare provider: Shirlee Latch, MD   Chief Complaint  Patient presents with   Hospitalization Follow-up    Patient seen a [facility] on 03/30/23 due to COPD. She reports she is not feeling well since discharged. Reports a lot of chest congestion and her nose has cleared up some.  Does not feel medicine is assisting her and she is doing bad at night due to constantly coughing.    Subjective    HPI HPI     Hospitalization Follow-up    Additional comments: Patient seen a [facility] on 03/30/23 due to COPD. She reports she is not feeling well since discharged. Reports a lot of chest congestion and her nose has cleared up some.  Does not feel medicine is assisting her and she is doing bad at night due to constantly coughing.       Last edited by Acey Lav, CMA on 04/05/2023  3:13 PM.       Discussed the use of AI scribe software for clinical note transcription with the patient, who gave verbal consent to proceed.  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 She reports some improvement but continues to experience significant chest congestion and coughing, particularly at night. The cough produces thick, white mucus. The patient also reports wheezing and a worsening of symptoms after completing a five-day course of prednisone. She denies any recent flu-like symptoms and has tested negative for COVID-19, flu, and RSV. The patient is currently on a seven-day course of doxycycline and continues to use Breztri and albuterol. She reports that [employer], prescribed for cough, have not been effective.         Medications: Outpatient Medications Prior to Visit   Medication Sig   acetaminophen (TYLENOL) 500 MG tablet Take 500 mg by mouth every 6 (six) hours as needed for moderate pain.   albuterol (VENTOLIN HFA) 108 (90 Base) MCG/ACT inhaler SMARTSIG:2 inhalation Via Inhaler Every 4 Hours PRN   amLODipine (NORVASC) 5 MG tablet Take 5 mg by mouth daily.   aspirin EC 81 MG tablet Take 1 tablet (81 mg total) by mouth daily. Swallow whole.   Budeson-Glycopyrrol-Formoterol (BREZTRI AEROSPHERE) 160-9-4.8 MCG/ACT AERO Inhale 2 puffs into the lungs 2 (two) times daily.   cefUROXime (CEFTIN) 500 MG tablet Take 1 tablet (500 mg total) by mouth 2 (two) times daily with a meal for 7 days.   doxycycline (VIBRA-TABS) 100 MG tablet Take 1 tablet (100 mg total) by mouth 2 (two) times daily for 7 days.   fluticasone (FLONASE) 50 MCG/ACT nasal spray Place 2 sprays into both nostrils daily.   hydrochlorothiazide (HYDRODIURIL) 25 MG tablet Take 25 mg by mouth daily.   hydrocortisone (ANUSOL-HC) 25 MG suppository Place 1 suppository (25 mg total) rectally 2 (two) times daily as needed for hemorrhoids or anal itching.   rosuvastatin (CRESTOR) 5 MG tablet TAKE 1 TABLET (5 MG TOTAL) BY MOUTH DAILY.   [DISCONTINUED] benzonatate (TESSALON PERLES) 100 MG capsule Take 1 capsule (100 mg total) by mouth 3 (three) times daily as needed for cough.   No facility-administered medications prior to visit.    Review of Systems     Objective  BP (!) 150/78 (BP Location: Right Arm, Patient Position: Sitting, Cuff Size: Normal)   Pulse 81   Ht 5\' 5"  (1.651 m)   Wt 217 lb 4.8 oz (98.6 kg)   SpO2 98%   BMI 36.16 kg/m    Physical Exam Vitals reviewed.  Constitutional:      General: She is not in acute distress.    Appearance: Normal appearance. She is well-developed. She is not diaphoretic.  HENT:     Head: Normocephalic and atraumatic.  Eyes:     General: No scleral icterus.    Conjunctiva/sclera: Conjunctivae normal.  Neck:     Thyroid: No thyromegaly.  Cardiovascular:      Rate and Rhythm: Normal rate and regular rhythm.     Heart sounds: Normal heart sounds. No murmur heard. Pulmonary:     Effort: Pulmonary effort is normal. No respiratory distress.     Breath sounds: Wheezing present. No rhonchi or rales.  Musculoskeletal:     Cervical back: Neck supple.     Right lower leg: No edema.     Left lower leg: No edema.  Lymphadenopathy:     Cervical: No cervical adenopathy.  Skin:    General: Skin is warm and dry.  Neurological:     Mental Status: She is alert. Mental status is at baseline.      No results found for any visits on 04/05/23.  Assessment & Plan     Problem List Items Addressed This Visit   None Visit Diagnoses       COPD exacerbation (HCC)    -  Primary   Relevant Medications   predniSONE (DELTASONE) 20 MG tablet   guaiFENesin-codeine 100-10 MG/5ML syrup           COPD Exacerbation Ongoing symptoms include persistent cough, wheezing, and thick white mucus. Symptoms worsened post 5-day prednisone course. Negative for COVID, flu, and RSV. Oxygen saturation at 98% on room air. Wheezing noted on auscultation. Extended prednisone course due to persistent symptoms and wheezing. Discussed risks of temporary hypertension with prednisone; benefits of improved breathing outweigh risks. Prescribed codeine cough syrup for sleep and cough reduction, with drowsiness as a potential side effect. - Prescribe additional 5-day course of prednisone, 40 mg daily (2 pills in the morning with breakfast) - Continue doxycycline, 100 mg BID, and cefuroxime BID until completion - Discontinue Tessalon Perles - Prescribe codeine cough syrup with Mucinex, to be taken up to three times a day, starting with bedtime dose  Sleep Apnea Managed with CPAP at night. No changes in management discussed.  Dissecting Thoracic Aortic Aneurysm (Status Post TAVR) Status post transcatheter aortic valve replacement (TAVR). No acute issues discussed.  General Health  Maintenance Blood pressure elevated, likely due to illness and prednisone use. - Monitor blood pressure, aware of potential temporary elevation due to prednisone  Follow-up - Follow up if symptoms persist or worsen - Pick up medications from [facility] - Show license to pick up codeine cough syrup.       Return if symptoms worsen or fail to improve.       Shirlee Latch, MD  [facility] [phone] (phone) [phone] (fax)  [facility]

## 2023-05-04 ENCOUNTER — Other Ambulatory Visit: Payer: Self-pay | Admitting: Specialist

## 2023-05-04 DIAGNOSIS — J449 Chronic obstructive pulmonary disease, unspecified: Secondary | ICD-10-CM

## 2023-05-04 DIAGNOSIS — Z87891 Personal history of nicotine dependence: Secondary | ICD-10-CM

## 2023-05-10 ENCOUNTER — Telehealth: Admitting: Physician Assistant

## 2023-05-10 DIAGNOSIS — Z20828 Contact with and (suspected) exposure to other viral communicable diseases: Secondary | ICD-10-CM

## 2023-05-10 DIAGNOSIS — R6889 Other general symptoms and signs: Secondary | ICD-10-CM

## 2023-05-10 MED ORDER — OSELTAMIVIR PHOSPHATE 75 MG PO CAPS: 75.0000 mg | ORAL_CAPSULE | Freq: Two times a day (BID) | ORAL | 0 refills | Status: DC

## 2023-05-10 NOTE — Progress Notes (Signed)
 Message sent to patient requesting further input regarding current symptoms. Awaiting patient response.

## 2023-05-10 NOTE — Progress Notes (Signed)
 I have spent 5 minutes in review of e-visit questionnaire, review and updating patient chart, medical decision making and response to patient.   Piedad Climes, PA-C

## 2023-05-10 NOTE — Progress Notes (Signed)

## 2023-05-11 ENCOUNTER — Ambulatory Visit

## 2023-05-18 ENCOUNTER — Ambulatory Visit
Admission: RE | Admit: 2023-05-18 | Discharge: 2023-05-18 | Disposition: A | Discharge status: Home / Self Care | Source: Ambulatory Visit | Attending: Specialist | Admitting: Specialist

## 2023-05-18 DIAGNOSIS — J449 Chronic obstructive pulmonary disease, unspecified: Secondary | ICD-10-CM | POA: Diagnosis present

## 2023-05-18 DIAGNOSIS — Z87891 Personal history of nicotine dependence: Secondary | ICD-10-CM | POA: Diagnosis present

## 2023-05-29 ENCOUNTER — Emergency Department
Admission: EM | Admit: 2023-05-29 | Discharge: 2023-05-29 | Discharge status: Against Medical Advice | Attending: Emergency Medicine | Admitting: Emergency Medicine

## 2023-05-29 ENCOUNTER — Emergency Department

## 2023-05-29 ENCOUNTER — Encounter: Payer: Self-pay | Admitting: Emergency Medicine

## 2023-05-29 ENCOUNTER — Other Ambulatory Visit: Payer: Self-pay

## 2023-05-29 ENCOUNTER — Emergency Department
Admission: EM | Admit: 2023-05-29 | Discharge: 2023-05-29 | Disposition: A | Discharge status: Home / Self Care | Source: Home / Self Care | Attending: Emergency Medicine | Admitting: Emergency Medicine

## 2023-05-29 DIAGNOSIS — I10 Essential (primary) hypertension: Secondary | ICD-10-CM | POA: Insufficient documentation

## 2023-05-29 DIAGNOSIS — R0602 Shortness of breath: Secondary | ICD-10-CM | POA: Diagnosis not present

## 2023-05-29 DIAGNOSIS — J449 Chronic obstructive pulmonary disease, unspecified: Secondary | ICD-10-CM | POA: Insufficient documentation

## 2023-05-29 DIAGNOSIS — R059 Cough, unspecified: Secondary | ICD-10-CM

## 2023-05-29 DIAGNOSIS — M546 Pain in thoracic spine: Secondary | ICD-10-CM | POA: Insufficient documentation

## 2023-05-29 DIAGNOSIS — J441 Chronic obstructive pulmonary disease with (acute) exacerbation: Secondary | ICD-10-CM | POA: Insufficient documentation

## 2023-05-29 DIAGNOSIS — R0600 Dyspnea, unspecified: Secondary | ICD-10-CM | POA: Insufficient documentation

## 2023-05-29 DIAGNOSIS — Z5321 Procedure and treatment not carried out due to patient leaving prior to being seen by health care provider: Secondary | ICD-10-CM | POA: Diagnosis not present

## 2023-05-29 DIAGNOSIS — I77810 Thoracic aortic ectasia: Secondary | ICD-10-CM | POA: Insufficient documentation

## 2023-05-29 DIAGNOSIS — R911 Solitary pulmonary nodule: Secondary | ICD-10-CM | POA: Diagnosis not present

## 2023-05-29 LAB — BASIC METABOLIC PANEL
Anion gap: 7 (ref 5–15)
BUN: 13 mg/dL (ref 8–23)
CO2: 26 mmol/L (ref 22–32)
Calcium: 9.2 mg/dL (ref 8.9–10.3)
Chloride: 107 mmol/L (ref 98–111)
Creatinine, Ser: 0.74 mg/dL (ref 0.44–1.00)
GFR, Estimated: >60 mL/min (ref >60)
Glucose, Bld: 94 mg/dL (ref 70–99)
Potassium: 3.6 mmol/L (ref 3.5–5.1)
Sodium: 140 mmol/L (ref 135–145)

## 2023-05-29 LAB — TROPONIN I (HIGH SENSITIVITY)
Troponin I (High Sensitivity): 14 ng/L (ref <18)
Troponin I (High Sensitivity): 13 ng/L (ref <18)

## 2023-05-29 LAB — CBC
HCT: 40.4 % (ref 36.0–46.0)
Hemoglobin: 13.4 g/dL (ref 12.0–15.0)
MCH: 30.1 pg (ref 26.0–34.0)
MCHC: 33.2 g/dL (ref 30.0–36.0)
MCV: 90.8 fL (ref 80.0–100.0)
Platelets: 183 10*3/uL (ref 150–400)
RBC: 4.45 MIL/uL (ref 3.87–5.11)
RDW: 12.9 % (ref 11.5–15.5)
WBC: 8.7 10*3/uL (ref 4.0–10.5)
nRBC: 0 % (ref 0.0–0.2)

## 2023-05-29 LAB — RESP PANEL BY RT-PCR (RSV, FLU A&B, COVID)  RVPGX2
Influenza A by PCR: NEGATIVE
Influenza B by PCR: NEGATIVE
Resp Syncytial Virus by PCR: NEGATIVE
SARS Coronavirus 2 by RT PCR: NEGATIVE

## 2023-05-29 LAB — BRAIN NATRIURETIC PEPTIDE: B Natriuretic Peptide: 116.5 pg/mL — ABNORMAL HIGH (ref 0.0–100.0)

## 2023-05-29 MED ORDER — HYDROCHLOROTHIAZIDE 25 MG PO TABS
25.0000 mg | ORAL_TABLET | Freq: Once | ORAL | Status: AC
  Administered 2023-05-29: 25 mg via ORAL
  Filled 2023-05-29: qty 1

## 2023-05-29 MED ORDER — LIDOCAINE VISCOUS HCL 2 % MT SOLN
15.0000 mL | Freq: Once | OROMUCOSAL | Status: AC
  Administered 2023-05-29: 15 mL via ORAL
  Filled 2023-05-29: qty 15

## 2023-05-29 MED ORDER — PREDNISONE 20 MG PO TABS: 40.0000 mg | ORAL_TABLET | Freq: Every day | ORAL | 0 refills | Status: AC

## 2023-05-29 MED ORDER — IPRATROPIUM-ALBUTEROL 0.5-2.5 (3) MG/3ML IN SOLN
3.0000 mL | Freq: Once | RESPIRATORY_TRACT | Status: AC
  Administered 2023-05-29: 3 mL via RESPIRATORY_TRACT
  Filled 2023-05-29: qty 3

## 2023-05-29 MED ORDER — ALUM & MAG HYDROXIDE-SIMETH 200-200-20 MG/5ML PO SUSP
30.0000 mL | Freq: Once | ORAL | Status: AC
  Administered 2023-05-29: 30 mL via ORAL
  Filled 2023-05-29: qty 30

## 2023-05-29 MED ORDER — IPRATROPIUM-ALBUTEROL 0.5-2.5 (3) MG/3ML IN SOLN
6.0000 mL | Freq: Once | RESPIRATORY_TRACT | Status: AC
  Administered 2023-05-29: 6 mL via RESPIRATORY_TRACT
  Filled 2023-05-29: qty 3

## 2023-05-29 MED ORDER — DOXYCYCLINE HYCLATE 100 MG PO TABS: 100.0000 mg | ORAL_TABLET | Freq: Two times a day (BID) | ORAL | 0 refills | Status: AC

## 2023-05-29 MED ORDER — IOHEXOL 350 MG/ML SOLN
100.0000 mL | Freq: Once | INTRAVENOUS | Status: AC | PRN
  Administered 2023-05-29: 100 mL via INTRAVENOUS

## 2023-05-29 MED ORDER — PREDNISONE 20 MG PO TABS
40.0000 mg | ORAL_TABLET | Freq: Once | ORAL | Status: AC
  Administered 2023-05-29: 40 mg via ORAL
  Filled 2023-05-29: qty 2

## 2023-05-29 MED ORDER — DOXYCYCLINE HYCLATE 100 MG PO TABS
100.0000 mg | ORAL_TABLET | Freq: Once | ORAL | Status: AC
  Administered 2023-05-29: 100 mg via ORAL
  Filled 2023-05-29: qty 1

## 2023-05-29 NOTE — ED Notes (Signed)
Pt provided crackers/juice/water.

## 2023-05-29 NOTE — ED Notes (Signed)
Called for treatment room.  No answer.

## 2023-05-29 NOTE — ED Provider Notes (Signed)
 [facility] Provider Note    Event Date/Time   First MD Initiated Contact with Patient 05/29/23 1720     (approximate)   History   Shortness of Breath   HPI  Angel Lane is a 73 y.o. female with history of COPD, hypertension, prior history of dissection s/p repair, presenting with bilateral mid back pain with cough and shortness of breath.  States that she has some mild chest pain on the left side.  Shortness of breath has been ongoing for 2 days.  Worse with exertion.  Denies any history of cancer, no unilateral calf swelling or tenderness.  No fevers, no abdominal pain, no lower back pain, no urinary symptoms, no nausea, vomiting, diarrhea.  She presented to emergency department earlier and had a CBC and BMP drawn but left shortly after.  States that she took some Tylenol prior to coming back to the emergency department.   On independent chart review, labs that were drawn this morning, electrolytes not severely deranged, creatinine is normal, no leukocytosis.  She was also seen by pulmonology in late February, is wearing CPAP nightly.  Has history of type B dissection s/p repair, she is also status post TAVR.  Did have a CT scan that showed a right upper lobe nodule that has been stable since 2019.  Physical Exam   Triage Vital Signs: ED Triage Vitals  Encounter Vitals Group     BP 05/29/23 1553 (!) 152/81     Systolic BP Percentile --      Diastolic BP Percentile --      Pulse Rate 05/29/23 1553 87     Resp 05/29/23 1553 18     Temp 05/29/23 1553 98.5 F (36.9 C)     Temp Source 05/29/23 1553 Oral     SpO2 05/29/23 1553 93 %     Weight 05/29/23 1552 216 lb 14.9 oz (98.4 kg)     Height 05/29/23 1552 5\' 5"  (1.651 m)     Head Circumference --      Peak Flow --      Pain Score 05/29/23 1552 9     Pain Loc --      Pain Education --      Exclude from Growth Chart --     Most recent vital signs: Vitals:   05/29/23 2049 05/29/23 2104  BP: (!)  165/82 (!) 165/85  Pulse: 90 90  Resp:  16  Temp:    SpO2: 95% 95%     General: Awake, no distress.  CV:  Good peripheral perfusion.  Resp:  Normal effort.  Clear, diminished breath sounds posteriorly Abd:  No distention.  Soft nontender Other:  No anterior thoracic cage tenderness, but she does have bilateral lower posterior thoracic cage tenderness without overlying rash, erythema, ecchymoses.  No CVA tenderness, no midline spinal tenderness.  No obvious lower extremity edema, no unilateral calf swelling or tenderness.   ED Results / Procedures / Treatments   Labs (all labs ordered are listed, but only abnormal results are displayed) Labs Reviewed  BRAIN NATRIURETIC PEPTIDE - Abnormal; Notable for the following components:      Result Value   B Natriuretic Peptide 116.5 (*)    All other components within normal limits  RESP PANEL BY RT-PCR (RSV, FLU A&B, COVID)  RVPGX2  TROPONIN I (HIGH SENSITIVITY)  TROPONIN I (HIGH SENSITIVITY)     EKG  EKG shows sinus rhythm, rate 81, normal QRS, no ischemic ST elevation,  T wave flattening in V4 through V6, V3, not slightly changed compared to prior   RADIOLOGY CT imaging on my interpretation without obvious pulmonary embolism.   PROCEDURES:  Critical Care performed: No  Procedures   MEDICATIONS ORDERED IN ED: Medications  ipratropium-albuterol (DUONEB) 0.5-2.5 (3) MG/3ML nebulizer solution 3 mL (3 mLs Nebulization Given 05/29/23 1834)  iohexol (OMNIPAQUE) 350 MG/ML injection 100 mL (100 mLs Intravenous Contrast Given 05/29/23 1843)  hydrochlorothiazide (HYDRODIURIL) tablet 25 mg (25 mg Oral Given 05/29/23 1941)  alum & mag hydroxide-simeth (MAALOX/MYLANTA) 200-200-20 MG/5ML suspension 30 mL (30 mLs Oral Given 05/29/23 2030)    And  lidocaine (XYLOCAINE) 2 % viscous mouth solution 15 mL (15 mLs Oral Given 05/29/23 2030)  ipratropium-albuterol (DUONEB) 0.5-2.5 (3) MG/3ML nebulizer solution 6 mL (6 mLs Nebulization Given 05/29/23  2030)  predniSONE (DELTASONE) tablet 40 mg (40 mg Oral Given 05/29/23 2030)  doxycycline (VIBRA-TABS) tablet 100 mg (100 mg Oral Given 05/29/23 2030)     IMPRESSION / MDM / ASSESSMENT AND PLAN / ED COURSE  I reviewed the triage vital signs and the nursing notes.                              Differential diagnosis includes, but is not limited to, musculoskeletal pain, pneumonia, viral illness, ACS, considered CHF but patient has no lower extremity edema, no crackles on exam, considered COPD exacerbation but she has no wheezing, she does have diminished breath sounds posteriorly, will give her DuoNeb to see if it opens up and if I can hear wheezing on reexam.  Since CBC, BMP, chest x-ray were already obtained previously, will get troponin, BNP, respiratory viral panel, and repeat EKG.  Patient's presentation is most consistent with acute presentation with potential threat to life or bodily function.  On reassessment patient still feels short of breath, she is a bit tachypneic, independent review of labs, troponin is not elevated, BNP is mildly elevated, respiratory viral panel is negative.  Chest x-ray obtained previously was negative for focal consolidation.  Given that she still short of breath, will get a CT PE study.  On reassessment patient felt slightly better with the DuoNeb's.  On reexam, she is not having wheezing on the right, slight slightly diminished in the left.  Will give her 2 additional DuoNebs, doxycycline, prednisone.  Patient is also complaining of some burning to her back because of her throat, give her a GI cocktail.  Will plan to get a repeat Trope.    On reassessment after DuoNeb's, she was sleeping, woke her up and she states that she is asymptomatic at this time, she is not tachypneic, lungs are clear.  She is safe for outpatient management.  Will send doxycycline as well as steroids to her pharmacy. Discussed with patient about imaging results as well as labs, discussed  about incidental findings on CT and patient knows to follow-up with her pulmonologist as well as her primary care doctor for the incidental findings.  Shared decision making done with patient and she is agreeable with plan for discharge.  Considered but no indication for inpatient admission at this time, she is safe for outpatient management.  Discharged with strict return precautions.  Clinical Course as of 05/29/23 2118  Tue May 29, 2023  1910 CT Angio Chest PE W/Cm &/Or Wo Cm IMPRESSION: 1. No evidence of significant pulmonary embolus. 2. Prominent emphysematous changes throughout the lungs. 3. 8 mm right upper lung nodule  is unchanged dating back to 11/27/2017. Long-term stability suggests benign etiology. 4. Descending aortic stent graft. 5. 4.1 cm diameter ascending thoracic aortic aneurysm. Recommend annual imaging followup by CTA or MRA. This recommendation follows 2010 ACCF/AHA/AATS/ACR/ASA/SCA/SCAI/SIR/STS/SVM Guidelines for the Diagnosis and Management of Patients with Thoracic Aortic Disease. Circulation. 2010; 121: W119-J478. Aortic aneurysm NOS (ICD10-I71.9)   [TT]  1952 On reassessment patient felt little bit better with the DuoNeb's, she now has some wheezing on the right, left distal slightly diminished.  Will give her 2 more DuoNebs as well as steroids and doxycycline for the new cough and treat for COPD exacerbation.  She is also still feeling some burning sensation in her back that goes up to her throat.  Suspect this might be GERD, will give her some Maalox and lidocaine. [TT]  2032 Troponin I (High Sensitivity) Second troponin is not elevated. [TT]    Clinical Course User Index [TT] Claybon Jabs, MD     FINAL CLINICAL IMPRESSION(S) / ED DIAGNOSES   Final diagnoses:  Dyspnea, unspecified type  Acute bilateral thoracic back pain  Cough, unspecified type  Pulmonary nodule  Ascending aorta dilatation (HCC)  COPD exacerbation (HCC)     Rx / DC Orders   ED  Discharge Orders          Ordered    predniSONE (DELTASONE) 20 MG tablet  Daily        05/29/23 2117    doxycycline (VIBRA-TABS) 100 MG tablet  2 times daily        05/29/23 2117             Note:  This document was prepared using Dragon voice recognition software and may include unintentional dictation errors.    Claybon Jabs, MD 05/29/23 2118

## 2023-05-29 NOTE — ED Notes (Signed)
 Called pt for updated vital signs. Pt did not answer. Pt not observed in the lobby.

## 2023-05-29 NOTE — ED Triage Notes (Signed)
 Patient to ED via POV for SOB x2 days. Dyspnea with exertions. Hx of COPD. Speaking in full sentences without difficulty.

## 2023-05-29 NOTE — Discharge Instructions (Signed)
 Please take the antibiotics prescribed for your COPD exacerbation as well as the steroids.

## 2023-05-29 NOTE — ED Triage Notes (Signed)
 Patient to ED via POV for SOB. Left ED earlier this afternoon prior to being seen by MD and now has came back. NAD noted. States she took tylenol after leaving and before coming back. Speaking in full sentences without difficulty.

## 2023-05-29 NOTE — ED Notes (Signed)
 Pt transported to CT ?

## 2023-05-31 ENCOUNTER — Encounter: Payer: Self-pay | Admitting: Family Medicine

## 2023-05-31 ENCOUNTER — Ambulatory Visit: Admitting: Family Medicine

## 2023-05-31 VITALS — BP 136/80 | HR 82 | Temp 97.8°F | Resp 16 | Ht 64.0 in | Wt 214.0 lb

## 2023-05-31 DIAGNOSIS — J441 Chronic obstructive pulmonary disease with (acute) exacerbation: Secondary | ICD-10-CM | POA: Diagnosis not present

## 2023-05-31 DIAGNOSIS — I7121 Aneurysm of the ascending aorta, without rupture: Secondary | ICD-10-CM

## 2023-05-31 NOTE — Progress Notes (Signed)
 Established patient visit   Patient: Angel Lane   DOB: 1950-12-25   73 y.o. Female  MRN: [id number] Visit Date: 05/31/2023  Today's healthcare provider: Shirlee Latch, MD   Chief Complaint  Patient presents with   Hospitalization Follow-up    Patient was seen 05/29/2023 for COPD at ED   Subjective    HPI HPI     Hospitalization Follow-up    Additional comments: Patient was seen 05/29/2023 for COPD at ED      Last edited by Marjie Skiff, CMA on 05/31/2023  1:56 PM.       Discussed the use of AI scribe software for clinical note transcription with the patient, who gave verbal consent to proceed.  History of Present Illness   [name] presents for follow-up after an ER visit two days prior for shortness of breath and cough. In the ER, the patient was treated with Duoneb, steroids, and doxycycline, which improved her symptoms. A CT scan was performed to rule out PE, but it revealed an ascending thoracic aortic aneurysm measuring 4.1 cm. 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 The patient also has a stable pulmonary nodule that has been present and unchanged for six years. The patient expresses concern and anxiety about the newly discovered aneurysm.         Medications: Outpatient Medications Prior to Visit  Medication Sig   acetaminophen (TYLENOL) 500 MG tablet Take 500 mg by mouth every 6 (six) hours as needed for moderate pain.   albuterol (VENTOLIN HFA) 108 (90 Base) MCG/ACT inhaler SMARTSIG:2 inhalation Via Inhaler Every 4 Hours PRN   amLODipine (NORVASC) 5 MG tablet Take 5 mg by mouth daily.   aspirin EC 81 MG tablet Take 1 tablet (81 mg total) by mouth daily. Swallow whole.   Budeson-Glycopyrrol-Formoterol (BREZTRI AEROSPHERE) 160-9-4.8 MCG/ACT AERO Inhale 2 puffs into the lungs 2 (two) times daily.   doxycycline (VIBRA-TABS) 100 MG tablet Take 1 tablet (100 mg total) by mouth 2 (two) times daily  for 7 days.   fluticasone (FLONASE) 50 MCG/ACT nasal spray Place 2 sprays into both nostrils daily.   guaiFENesin-codeine 100-10 MG/5ML syrup Take 10 mLs by mouth 3 (three) times daily as needed for cough.   hydrochlorothiazide (HYDRODIURIL) 25 MG tablet Take 25 mg by mouth daily.   hydrocortisone (ANUSOL-HC) 25 MG suppository Place 1 suppository (25 mg total) rectally 2 (two) times daily as needed for hemorrhoids or anal itching.   predniSONE (DELTASONE) 20 MG tablet Take 2 tablets (40 mg total) by mouth daily for 4 days.   rosuvastatin (CRESTOR) 5 MG tablet TAKE 1 TABLET (5 MG TOTAL) BY MOUTH DAILY.   oseltamivir (TAMIFLU) 75 MG capsule Take 1 capsule (75 mg total) by mouth 2 (two) times daily.   No facility-administered medications prior to visit.    Review of Systems      Objective    BP 136/80 (BP Location: Left Arm, Patient Position: Sitting, Cuff Size: Large)   Pulse 82   Temp 97.8 F (36.6 C) (Oral)   Resp 16   Ht 5\' 4"  (1.626 m)   Wt 214 lb (97.1 kg)   SpO2 99%   BMI 36.73 kg/m    Physical Exam Vitals reviewed.  Constitutional:      General: She is not in acute distress.    Appearance: Normal appearance. She is well-developed. She is not diaphoretic.  HENT:     Head:  Normocephalic and atraumatic.  Eyes:     General: No scleral icterus.    Conjunctiva/sclera: Conjunctivae normal.  Neck:     Thyroid: No thyromegaly.  Cardiovascular:     Rate and Rhythm: Normal rate and regular rhythm.  Pulmonary:     Effort: Pulmonary effort is normal. No respiratory distress.     Breath sounds: Wheezing (subtle, intermittent) present. No rhonchi or rales.  Musculoskeletal:     Cervical back: Neck supple.     Right lower leg: No edema.     Left lower leg: No edema.  Lymphadenopathy:     Cervical: No cervical adenopathy.  Skin:    General: Skin is warm and dry.  Neurological:     Mental Status: She is alert. Mental status is at baseline.      No results found for any  visits on 05/31/23.  Assessment & Plan     Problem List Items Addressed This Visit       Cardiovascular and Mediastinum   Aneurysm of ascending aorta without rupture (HCC) - Primary   Other Visit Diagnoses       COPD exacerbation (HCC)           Assessment and Plan    Ascending Thoracic Aortic Aneurysm A 4.1 cm ascending thoracic aortic aneurysm was identified on a recent CT scan. 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 The ascending aneurysm is concerning due to its size, typically requiring cardiothoracic surgical intervention. She is asymptomatic for dissection, but the size of the aneurysm warrants surgical evaluation. - Contact Dr. Lenell Antu to confirm if he manages ascending aortic aneurysms - If Dr. Juanetta Gosling does not manage this, refer to cardiothoracic surgery for evaluation and potential repair - Advise her to seek immediate medical attention if experiencing profound chest pain  Shortness of Breath and Cough - COPD exacerbation She was seen in the ER for shortness of breath and cough. Workup included a negative troponin, mildly elevated BNP, negative respiratory viral panel, and a negative chest x-ray for focal consolidation. She improved with Duoneb treatment and was prescribed steroids and doxycycline. The symptoms have improved, and she has not used her inhaler in two days. - Continue and complete the course of doxycycline and prednisone - Monitor respiratory symptoms and use inhaler as needed  Pulmonary Nodule A pulmonary nodule, measuring 7 mm, has been present for six years with no change in size. It was noted during a cancer screening CT scan. - Continue routine monitoring of the pulmonary nodule        Return if symptoms worsen or fail to improve.       Shirlee Latch, MD  [facility] [phone] (phone) [phone] (fax)  [facility]

## 2023-06-04 NOTE — Addendum Note (Signed)
 Addended by: Erasmo Downer on: 06/04/2023 08:53 AM   Modules accepted: Orders

## 2023-06-29 ENCOUNTER — Other Ambulatory Visit: Payer: Self-pay | Admitting: Pulmonary Disease

## 2023-07-03 ENCOUNTER — Ambulatory Visit: Attending: Thoracic Surgery (Cardiothoracic Vascular Surgery) | Admitting: Physician Assistant

## 2023-07-03 ENCOUNTER — Encounter: Payer: Self-pay | Admitting: Physician Assistant

## 2023-07-03 VITALS — BP 137/81 | HR 86 | Resp 18 | Ht 64.0 in | Wt 217.0 lb

## 2023-07-03 DIAGNOSIS — I7121 Aneurysm of the ascending aorta, without rupture: Secondary | ICD-10-CM

## 2023-07-03 NOTE — Progress Notes (Signed)
 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 Nolene Baumgarten.  She was recently seen in the emergency room and for back pain associated with cough and shortness of breath.  She underwent CT of the chest showing chronic emphysematous changes in her lungs along with stable 8 mm right upper lobe pulmonary nodule.  She was also noted to have a 4.1 cm dilation of the ascending aorta.  She has been seen in follow-up by her PCP,  Dr. Aden Agreste, since the ER visit and was referred to us  for surveillance of the thoracic aortic aneurysm.  Over the previous few years, she has had multiple CTA chest/abdomen/pelvis studies to evaluate the thoracic stent repair with no mention of the ascending  aortic aneurysm being present on those studies. Ms. Lougee has no family history of aortic aneurysms.  She denies any chest pain but does have shortness of breath with exertion.   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   Sleep  apnea    Stroke [facility])    November 2018     Past Surgical History:  Procedure Laterality Date   ABDOMINAL SURGERY     CARDIAC CATHETERIZATION     CAROTID-SUBCLAVIAN BYPASS GRAFT Bilateral 05/14/2020   Procedure: BILATERAL CAROTID-SUBCLAVIAN  TRANSPOSITION;  Surgeon: Richrd Char, MD;  Location: [facility] OR;  Service: Vascular;  Laterality: Bilateral;   CATARACT EXTRACTION W/PHACO Left 05/16/2017   Procedure: CATARACT EXTRACTION PHACO AND INTRAOCULAR LENS PLACEMENT (IOC) LEFT;  Surgeon: Annell Kidney, MD;  Location: [facility];  Service: Ophthalmology;  Laterality: Left;   CATARACT EXTRACTION W/PHACO Right 06/13/2017   Procedure: CATARACT EXTRACTION PHACO AND INTRAOCULAR LENS PLACEMENT (IOC) RIGHT;  Surgeon: Annell Kidney, MD;  Location: [facility];  Service: Ophthalmology;  Laterality: Right;  sleep apnea   CHOLECYSTECTOMY     COLONOSCOPY N/A 01/21/2020   Procedure: COLONOSCOPY;  Surgeon: Toledo, Alphonsus Jeans, MD;  Location: [facility] ENDOSCOPY;  Service: Gastroenterology;  Laterality: N/A;   THORACIC AORTIC ENDOVASCULAR STENT GRAFT N/A 05/14/2020   Procedure: THORACIC AORTIC ENDOVASCULAR STENT USING GORE TAG 34mm X 15cm GRAFT AND GORE TAG 34mm X 20cm GRAFT;  Surgeon: Richrd Char, MD;  Location: [facility] OR;  Service: Vascular;  Laterality: N/A;   ULTRASOUND GUIDANCE FOR VASCULAR ACCESS Bilateral 05/14/2020   Procedure: ULTRASOUND GUIDANCE FOR VASCULAR ACCESS;  Surgeon: Richrd Char, MD;  Location: [facility] OR;  Service: Vascular;  Laterality: Bilateral;    Family History  Problem Relation Age of Onset   Asthma Mother  Heart failure Mother    Hypertension Mother    Heart attack Father    Breast cancer Maternal Aunt    Breast cancer Cousin        maternal 1st cousin    Social History Social History   Tobacco Use   Smoking status: Former    Current packs/day: 0.00    Average packs/day: 1 pack/day for 53.0 years (53.0 ttl pk-yrs)    Types: Cigarettes    Start  date: 06/05/1967    Quit date: 06/04/2020    Years since quitting: 3.0   Smokeless tobacco: Never   Tobacco comments:    Less than 1/2 PPD  Vaping Use   Vaping status: Former  Substance Use Topics   Alcohol use: No   Drug use: No    Current Outpatient Medications  Medication Sig Dispense Refill   acetaminophen  (TYLENOL ) 500 MG tablet Take 500 mg by mouth every 6 (six) hours as needed for moderate pain.     albuterol  (VENTOLIN  HFA) 108 (90 Base) MCG/ACT inhaler SMARTSIG:2 inhalation Via Inhaler Every 4 Hours PRN     amLODipine  (NORVASC ) 5 MG tablet Take 5 mg by mouth daily.     aspirin  EC 81 MG tablet Take 1 tablet (81 mg total) by mouth daily. Swallow whole. 30 tablet 12   Budeson-Glycopyrrol-Formoterol  (BREZTRI AEROSPHERE) 160-9-4.8 MCG/ACT AERO Inhale 2 puffs into the lungs 2 (two) times daily.     fluticasone  (FLONASE ) 50 MCG/ACT nasal spray Place 2 sprays into both nostrils daily. 16 g 0   guaiFENesin -codeine  100-10 MG/5ML syrup Take 10 mLs by mouth 3 (three) times daily as needed for cough. 120 mL 0   hydrochlorothiazide  (HYDRODIURIL ) 25 MG tablet Take 25 mg by mouth daily.     hydrocortisone  (ANUSOL -HC) 25 MG suppository Place 1 suppository (25 mg total) rectally 2 (two) times daily as needed for hemorrhoids or anal itching. 20 suppository 3   rosuvastatin  (CRESTOR ) 5 MG tablet TAKE 1 TABLET (5 MG TOTAL) BY MOUTH DAILY. 90 tablet 1   No current facility-administered medications for this visit.    Allergies  Allergen Reactions   Penicillins Other (See Comments)    chills    Review of Systems: Review of Systems  Constitutional:  Positive for weight loss.       Lost 8 lbs in the past 6 months  HENT: Negative.    Eyes:  Positive for blurred vision.       Floaters  Respiratory:  Positive for cough and shortness of breath.        Uses C-PAP at night for OSA  Cardiovascular:  Positive for palpitations.  Gastrointestinal:  Positive for constipation.  Musculoskeletal:  Positive  for joint pain.       Leg cramps  Neurological:  Positive for focal weakness.       Weakness left hand and left leg related to CVA in 2018  Psychiatric/Behavioral:  Positive for memory loss.       There were no vitals taken for this visit. Physical Exam: Vital Signs: BP   173/81 HR  86 RR  18 SpO2  97% on RA  General: Pleasant 73yo female in no distress. She is accompanied by her husband today.   HEENT: unremarkable  Neck: well-healed scars a the base of the neck bilaterally from previous vascular procedures. No carotid bruit, no JVD.  Heart: RRR. (I do not hear a murmur although she has reportedly had a murmur in the past).  Chest:  symmetrical with  clear breath sounds and normal work of breathing.   Exts: no deformities, all well perfused.   Neuro: no gross focal deficits.     Diagnostic Tests: CLINICAL DATA:  Pulmonary embolus suspected with high probability. Shortness of breath.   EXAM: CT ANGIOGRAPHY CHEST WITH CONTRAST   TECHNIQUE: Multidetector CT imaging of the chest was performed using the standard protocol during bolus administration of intravenous contrast. Multiplanar CT image reconstructions and MIPs were obtained to evaluate the vascular anatomy.   RADIATION DOSE REDUCTION: This exam was performed according to the departmental dose-optimization program which includes automated exposure control, adjustment of the mA and/or kV according to patient size and/or use of iterative reconstruction technique.   CONTRAST:  OMNIPAQUE  IOHEXOL  350 MG/ML SOLN   COMPARISON:  Chest radiograph 05/29/2023.  CT chest 05/18/2023   FINDINGS: Cardiovascular: Technically adequate study with good opacification of the central and segmental pulmonary arteries. Mild motion artifact. No focal filling defects demonstrated in the pulmonary arteries. No evidence of significant pulmonary embolus. Normal heart size. No pericardial effusions. Stent graft in the  descending thoracic aorta. Ascending aortic aneurysm measuring 4.1 cm diameter. No dissection.   Mediastinum/Nodes: Esophagus is decompressed. Thyroid gland is unremarkable. No significant lymphadenopathy.   Lungs/Pleura: Emphysematous changes throughout the lungs. Mild atelectasis in the lung bases. Focal nodule in the right upper lung, series 5, image 45, measuring 8 mm in diameter. No change since prior study. The nodule has been stable since previous chest radiograph dated 11/27/2017. Long-term stability suggests benign etiology.   Upper Abdomen: Surgical absence of the gallbladder. No acute abnormalities.   Musculoskeletal: No chest wall abnormality. No acute or significant osseous findings.   Review of the MIP images confirms the above findings.   IMPRESSION: 1. No evidence of significant pulmonary embolus. 2. Prominent emphysematous changes throughout the lungs. 3. 8 mm right upper lung nodule is unchanged dating back to 11/27/2017. Long-term stability suggests benign etiology. 4. Descending aortic stent graft. 5. 4.1 cm diameter ascending thoracic aortic aneurysm. Recommend annual imaging followup by CTA or MRA. This recommendation follows 2010 ACCF/AHA/AATS/ACR/ASA/SCA/SCAI/SIR/STS/SVM Guidelines for the Diagnosis and Management of Patients with Thoracic Aortic Disease. Circulation. 2010; 121: Z610-R604. Aortic aneurysm NOS (ICD10-I71.9)     Electronically Signed   By: Boyce Byes M.D.   On: 05/29/2023 19:07  Impression / Plan: 73 yo female with the above described past medical and surgical history is referred with a 4.1cm thoracic aortic aneurysm.  Previous CT scans were carefully reviewed and show no significant change in the thoracic aorta dating back to 2023. She is not symptomatic.  There is no indication for surgical intervention but we did discuss the recommendation for surveillance, careful attention blood pressure control, and the recommendation to avoid  strenuous activity or exercises. She already adheres to these parameters as they have been discussed with her by the vascular surgery team.  Will plan follow up in 1 year with a CTA chest and will also repeat the echo since she had moderate aortic insufficiency on the one done 3 years ago.  She is scheduled to have a biopsy of the pulmonary nodule within the next week.     Thaila Bottoms G. Chrissie Dacquisto, PA-C [facility] [phone]

## 2023-07-03 NOTE — Patient Instructions (Signed)
 Risk Modification in those with ascending thoracic aortic aneurysm:  Continue good control of blood pressure (prefer SBP 130/80 or less)  2. Avoid fluoroquinolone antibiotics (I.e Ciprofloxacin, Avelox , Levofloxacin, Ofloxacin)  3.  Use of statin ( you are on Crestor )  4.  Exercise and activity limitations is individualized, but in general, contact sports are to be avoided and one should avoid heavy lifting (defined as half of ideal body weight) and exercises involving sustained Valsalva maneuver.  5. Follow up in 1 year with CTA chest and and echocardiogram.

## 2023-07-04 ENCOUNTER — Encounter
Admission: RE | Admit: 2023-07-04 | Discharge: 2023-07-04 | Disposition: A | Discharge status: Home / Self Care | Source: Ambulatory Visit | Attending: Pulmonary Disease | Admitting: Pulmonary Disease

## 2023-07-04 ENCOUNTER — Other Ambulatory Visit: Payer: Self-pay | Admitting: Pulmonary Disease

## 2023-07-04 DIAGNOSIS — Z01810 Encounter for preprocedural cardiovascular examination: Secondary | ICD-10-CM

## 2023-07-04 DIAGNOSIS — Z79899 Other long term (current) drug therapy: Secondary | ICD-10-CM

## 2023-07-04 DIAGNOSIS — I10 Essential (primary) hypertension: Secondary | ICD-10-CM

## 2023-07-04 DIAGNOSIS — I25118 Atherosclerotic heart disease of native coronary artery with other forms of angina pectoris: Secondary | ICD-10-CM

## 2023-07-04 DIAGNOSIS — I4891 Unspecified atrial fibrillation: Secondary | ICD-10-CM

## 2023-07-04 DIAGNOSIS — Z01812 Encounter for preprocedural laboratory examination: Secondary | ICD-10-CM

## 2023-07-04 DIAGNOSIS — Z8673 Personal history of transient ischemic attack (TIA), and cerebral infarction without residual deficits: Secondary | ICD-10-CM

## 2023-07-04 DIAGNOSIS — R911 Solitary pulmonary nodule: Secondary | ICD-10-CM

## 2023-07-04 HISTORY — DX: Solitary pulmonary nodule: R91.1

## 2023-07-04 HISTORY — DX: Prediabetes: R73.03

## 2023-07-04 HISTORY — DX: Morbid (severe) obesity due to excess calories: E66.01

## 2023-07-04 HISTORY — DX: Polyneuropathy, unspecified: G62.9

## 2023-07-04 HISTORY — DX: Hyperlipidemia, unspecified: E78.5

## 2023-07-04 HISTORY — DX: Unspecified atrial flutter: I48.92

## 2023-07-04 HISTORY — DX: Diverticulitis of intestine, part unspecified, without perforation or abscess without bleeding: K57.92

## 2023-07-04 HISTORY — DX: Dissection of descending thoracic aorta: I71.012

## 2023-07-04 HISTORY — DX: Personal history of other diseases of the circulatory system: Z86.79

## 2023-07-04 HISTORY — DX: Abdominal aortic aneurysm, without rupture, unspecified: I71.40

## 2023-07-04 HISTORY — DX: Obstructive sleep apnea (adult) (pediatric): G47.33

## 2023-07-04 HISTORY — DX: Atherosclerotic heart disease of native coronary artery without angina pectoris: I25.10

## 2023-07-04 HISTORY — DX: Personal history of nicotine dependence: Z87.891

## 2023-07-04 NOTE — Progress Notes (Signed)
 Called pt to do her anesthesia interview. Pt states her 81 mg ASA was stopped last week sometime (unsure of exact date but was the week of 4-23) due to Dr Alejo Amsler office calling her to inform her to stop her asa 1 week prior to surgery. Pt states she also had a my chart message stating this but pt was unable to find while during the phone call

## 2023-07-04 NOTE — Patient Instructions (Signed)
 Your procedure is scheduled on:07-06-23 Friday Report to the Registration Desk on the 1st floor of the [facility].Then proceed to the 2nd floor Surgery Desk To find out your arrival time, please call [phone] between 1PM - 3PM on:07-05-23 Thursday If your arrival time is 6:00 am, do not arrive before that time as the Medical Mall entrance doors do not open until 6:00 am.  REMEMBER: Instructions that are not followed completely may result in serious medical risk, up to and including death; or upon the discretion of your surgeon and anesthesiologist your surgery may need to be rescheduled.  Do not eat food after midnight the night before surgery.  No gum chewing or hard candies.  You may however, drink CLEAR liquids up to 2 hours before you are scheduled to arrive for your surgery. Do not drink anything within 2 hours of your scheduled arrival time.  Clear liquids include: - water  - apple juice without pulp - gatorade (not RED colors) - black coffee or tea (Do NOT add milk or creamers to the coffee or tea) Do NOT drink anything that is not on this list.  One week prior to surgery:Stop NOW (07-04-23) Stop Anti-inflammatories (NSAIDS) such as Advil , Aleve, Ibuprofen , Motrin , Naproxen, Naprosyn and Aspirin  based products such as Excedrin, Goody's Powder, BC Powder. Stop ANY OVER THE COUNTER supplements until after surgery.  You may however, continue to take Tylenol  if needed for pain up until the day of surgery.  Continue taking all of your other prescription medications up until the day of surgery.  ON THE DAY OF SURGERY ONLY TAKE THESE MEDICATIONS WITH SIPS OF WATER: -rosuvastatin  (CRESTOR )   Use your Albuterol  and Breztri Inhaler the morning of surgery and bring your Albuterol  Inhaler to the hospital  81 mg Aspirin  was stopped last week  No Alcohol for 24 hours before or after surgery.  No Smoking including e-cigarettes for 24 hours before surgery.  No chewable tobacco  products for at least 6 hours before surgery.  No nicotine  patches on the day of surgery.  Do not use any "recreational" drugs for at least a week (preferably 2 weeks) before your surgery.  Please be advised that the combination of cocaine and anesthesia may have negative outcomes, up to and including death. If you test positive for cocaine, your surgery will be cancelled.  On the morning of surgery brush your teeth with toothpaste and water, you may rinse your mouth with mouthwash if you wish. Do not swallow any toothpaste or mouthwash.  Do not wear jewelry, make-up, hairpins, clips or nail polish.  For welded (permanent) jewelry: bracelets, anklets, waist bands, etc.  Please have this removed prior to surgery.  If it is not removed, there is a chance that hospital personnel will need to cut it off on the day of surgery.  Do not wear lotions, powders, or perfumes.   Do not shave body hair from the neck down 48 hours before surgery.  Contact lenses, hearing aids and dentures may not be worn into surgery.  Do not bring valuables to the hospital. [facility] is not responsible for any missing/lost belongings or valuables.   Bring your C-PAP to the hospital   Notify your doctor if there is any change in your medical condition (cold, fever, infection).  Wear comfortable clothing (specific to your surgery type) to the hospital.  After surgery, you can help prevent lung complications by doing breathing exercises.  Take deep breaths and cough every 1-2 hours. Your  doctor may order a device called an Incentive Spirometer to help you take deep breaths. When coughing or sneezing, hold a pillow firmly against your incision with both hands. This is called "splinting." Doing this helps protect your incision. It also decreases belly discomfort.  If you are being admitted to the hospital overnight, leave your suitcase in the car. After surgery it may be brought to your room.  In case of increased  patient census, it may be necessary for you, the patient, to continue your postoperative care in the Same Day Surgery department.  If you are being discharged the day of surgery, you will not be allowed to drive home. You will need a responsible individual to drive you home and stay with you for 24 hours after surgery.   If you are taking public transportation, you will need to have a responsible individual with you.  Please call the Pre-admissions Testing Dept. at [phone] if you have any questions about these instructions.  Surgery Visitation Policy:  Patients having surgery or a procedure may have two visitors.  Children under the age of 78 must have an adult with them who is not the patient.

## 2023-07-05 ENCOUNTER — Encounter: Payer: Self-pay | Admitting: Urgent Care

## 2023-07-05 ENCOUNTER — Encounter: Payer: Self-pay | Admitting: Family Medicine

## 2023-07-05 ENCOUNTER — Encounter
Admission: RE | Admit: 2023-07-05 | Discharge: 2023-07-05 | Disposition: A | Discharge status: Home / Self Care | Source: Ambulatory Visit | Attending: Pulmonary Disease | Admitting: Pulmonary Disease

## 2023-07-05 DIAGNOSIS — G4733 Obstructive sleep apnea (adult) (pediatric): Secondary | ICD-10-CM | POA: Diagnosis not present

## 2023-07-05 DIAGNOSIS — K219 Gastro-esophageal reflux disease without esophagitis: Secondary | ICD-10-CM | POA: Diagnosis not present

## 2023-07-05 DIAGNOSIS — Z01812 Encounter for preprocedural laboratory examination: Secondary | ICD-10-CM

## 2023-07-05 DIAGNOSIS — Z01818 Encounter for other preprocedural examination: Secondary | ICD-10-CM | POA: Insufficient documentation

## 2023-07-05 DIAGNOSIS — Z87891 Personal history of nicotine dependence: Secondary | ICD-10-CM | POA: Diagnosis not present

## 2023-07-05 DIAGNOSIS — T17500A Unspecified foreign body in bronchus causing asphyxiation, initial encounter: Secondary | ICD-10-CM | POA: Diagnosis not present

## 2023-07-05 DIAGNOSIS — I25118 Atherosclerotic heart disease of native coronary artery with other forms of angina pectoris: Secondary | ICD-10-CM | POA: Insufficient documentation

## 2023-07-05 DIAGNOSIS — Z01810 Encounter for preprocedural cardiovascular examination: Secondary | ICD-10-CM | POA: Diagnosis not present

## 2023-07-05 DIAGNOSIS — R590 Localized enlarged lymph nodes: Secondary | ICD-10-CM | POA: Diagnosis not present

## 2023-07-05 DIAGNOSIS — Z8673 Personal history of transient ischemic attack (TIA), and cerebral infarction without residual deficits: Secondary | ICD-10-CM | POA: Insufficient documentation

## 2023-07-05 DIAGNOSIS — I5032 Chronic diastolic (congestive) heart failure: Secondary | ICD-10-CM | POA: Diagnosis not present

## 2023-07-05 DIAGNOSIS — R911 Solitary pulmonary nodule: Secondary | ICD-10-CM | POA: Diagnosis present

## 2023-07-05 DIAGNOSIS — I4892 Unspecified atrial flutter: Secondary | ICD-10-CM | POA: Insufficient documentation

## 2023-07-05 DIAGNOSIS — I110 Hypertensive heart disease with heart failure: Secondary | ICD-10-CM | POA: Diagnosis not present

## 2023-07-05 DIAGNOSIS — J439 Emphysema, unspecified: Secondary | ICD-10-CM | POA: Diagnosis not present

## 2023-07-05 DIAGNOSIS — I4891 Unspecified atrial fibrillation: Secondary | ICD-10-CM | POA: Diagnosis not present

## 2023-07-05 DIAGNOSIS — I10 Essential (primary) hypertension: Secondary | ICD-10-CM | POA: Insufficient documentation

## 2023-07-05 DIAGNOSIS — Z79899 Other long term (current) drug therapy: Secondary | ICD-10-CM | POA: Insufficient documentation

## 2023-07-05 LAB — BASIC METABOLIC PANEL WITH GFR
Anion gap: 4 — ABNORMAL LOW (ref 5–15)
BUN: 17 mg/dL (ref 8–23)
CO2: 26 mmol/L (ref 22–32)
Calcium: 9.1 mg/dL (ref 8.9–10.3)
Chloride: 107 mmol/L (ref 98–111)
Creatinine, Ser: 0.85 mg/dL (ref 0.44–1.00)
GFR, Estimated: >60 mL/min (ref >60)
Glucose, Bld: 89 mg/dL (ref 70–99)
Potassium: 3.2 mmol/L — ABNORMAL LOW (ref 3.5–5.1)
Sodium: 137 mmol/L (ref 135–145)

## 2023-07-05 NOTE — Progress Notes (Signed)
 Perioperative / Anesthesia Services  Pre-Admission Testing Clinical Review / Pre-Operative Anesthesia Consult  Date: 07/05/23  Patient Demographics:  Name: Angel Lane DOB: [birth year] MRN:   [id number]  Planned Surgical Procedure(s):    Case: [id number] Date/Time: 07/06/23 1215   Procedures:      VIDEO BRONCHOSCOPY WITH ENDOBRONCHIAL NAVIGATION     BRONCHOSCOPY, WITH EBUS   Anesthesia type: General   Diagnosis: Right upper lobe pulmonary nodule [R91.1]   Pre-op diagnosis: R91.1, Right upper lobe pulmonary nodule   Location: [facility] PROCEDURE RM 02 / [facility]S FOR ANESTHESIA [facility]   Surgeons: Erskin Hearing, MD      NOTE: Available PAT nursing documentation and vital signs have been reviewed. Clinical nursing staff has updated patient's PMH/PSHx, current medication list, and drug allergies/intolerances to ensure comprehensive history available to assist in medical decision making as it pertains to the aforementioned surgical procedure and anticipated anesthetic course. Extensive review of available clinical information personally performed. [place] [facility] and PSH[facility] updated with any diagnoses/procedures that  may have been inadvertently omitted during his intake with the pre-admission testing department's nursing staff.  Clinical Discussion:  Angel Lane is a 73 y.o. female who is submitted for pre-surgical anesthesia review and clearance prior to her undergoing the above procedure. Patient is a Former Smoker (53 pack years; quit 06/2020). 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  Patient is followed by cardiology Beau Bound, MD). She was last seen in the cardiology clinic on  01/02/2019 for; notes reviewed. At the time of her clinic visit, patient doing well overall from a cardiovascular perspective. Patient denied any chest pain, shortness of breath, PND, orthopnea, palpitations, significant peripheral edema, weakness, fatigue, vertiginous symptoms, or presyncope/syncope. Patient with a past medical history significant for cardiovascular diagnoses. Documented physical exam was grossly benign, providing no evidence of acute exacerbation and/or decompensation of the patient's known cardiovascular conditions.  Of note, complete records regarding patient's cardiovascular history unavailable for review at time of consult.  Information gathered from patient report and from notes provided by local specialty care providers.  Patient reported to have undergone diagnostic LEFT heart catheterization in the past.  Records regarding coronary anatomy study unavailable for review  Patient with a history of CVA in the past.  She underwent MRI imaging of the brain on 01/06/2017 revealing an acute RIGHT basal ganglia infarct measuring 9 x 6 x 13 mm.  Incidentally on the MRI imaging, there was also remote infarctions of the LEFT basal ganglia and LEFT cerebellum.  Following neurological event on 01/06/2017, patient with LEFT-sided hemiparesis as a late effect.  Patient with a known history of a thoracic aortic aneurysm.  Aneurysmal defect dissected on 05/14/2020 necessitating patient undergo a BILATERAL carotid subclavian transposition and thoracic aortic stent graft placement; 34 mm x 15 cm and 34 mm x 20 cm graft placed extending from the LEFT common carotid to the celiac arteries.  Myocardial perfusion imaging study was performed on 01/09/2021 revealing a normal left ventricular systolic function with an EF of 66%.  There were no regional wall motion abnormalities.  There was no evidence of stress-induced myocardial ischemia or arrhythmia; no scintigraphic evidence of scar. TID ratio = 1.02.   Overall, study was determined to be normal and low risk.  Most recent TTE was  performed on 11/14/2021 revealing a normal left ventricular systolic function with an EF of >55%.  There were no regional wall motion abnormalities. Left ventricular diastolic Doppler parameters consistent with abnormal relaxation (G1DD).  Left ventricular GLS -18.2%.  Right ventricular size and function was normal.  There was mild mitral annular calcification.  Trivial to mild paravalvular regurgitation noted.  All transvalvular gradients were noted to be normal providing no evidence suggestive of valvular stenosis.  Ascending aorta moderately dilated 3.6 cm.  Blood pressure reasonably controlled at 138/76 mmHg on currently prescribed diuretic (HCTZ) monotherapy.  Patient is on rosuvastatin  for her HLD diagnosis and ASCVD prevention.  Patient has a prediabetes diagnosis that she is managing with diet lifestyle modification.  Most recent hemoglobin A1c was 5.9% when checked on 01/22/2023.  Patient does have an OSAH diagnosis and is reported be compliant with prescribed nocturnal PAP therapy.  Functional capacity somewhat limited by patient's shortness of breath and other multiple medical comorbidities, including LEFT hemiparesis following CVA.  With that said, patient able to complete all her ADLs/IADLs without cardiovascular limitation.  Per the [facility], patient felt to be able to achieve at least 4 METS of physical activity without experiencing any significant degrees of angina/anginal equivalent symptoms.  Several changes were made to her antihypertensive regimen.  Patient to follow-up with outpatient cardiology in 6 months or sooner if needed.  Angel Lane underwent low-dose lung cancer screening CT on 05/18/2023 revealing indolent enlargement of an anterior RIGHT upper lobe pulmonary nodule measuring 8.6 cm.  Further imaging performed via CTA on 05/29/2023 suggested that nodule was grossly unchanged dating back to 11/2017.   Initial scan suggested suspicious bronchogenic nodule, however repeat imaging suggesting benign etiology.  Patient was referred to pulmonary medicine Jaclynn Mast, MD) for further evaluation and definitive assessment of pulmonary concern.  The decision was made to proceed with tissue sampling via VIDEO BRONCHOSCOPY WITH ENDOBRONCHIAL NAVIGATION; BRONCHOSCOPY, WITH EBUS on 07/06/2023.Given patient's past medical history significant for cardiovascular diagnoses, presurgical cardiac clearance was sought by the PAT team. Per cardiology, "this patient is optimized for surgery and may proceed with the planned procedural course with a LOW risk of significant perioperative cardiovascular complications".  In review of the patient's chart, it is noted that she is on daily oral antithrombotic therapy. She has been instructed on recommendations for holding her daily low-dose ASA by her primary attending surgeons office.  Patient advising that she was told "sometime last week" to stop her ASA dose.  She is unsure of the date.  Patient denies previous perioperative complications with anesthesia in the past. In review her EMR, it is noted that patient underwent a general anesthetic course at [facility] ([facility]) in 05/2020 without documented complications.      07/03/2023    2:40 PM 05/31/2023    2:26 PM 05/31/2023    1:58 PM  Vitals with BMI  Height 5\' 4"   5\' 4"   Weight 217 lbs  214 lbs  BMI 37.23  36.72  Systolic 137 136 295  Diastolic 81 80 83  Pulse 86  82   Providers/Specialists:  NOTE: Primary physician provider listed below. Patient may have been seen by APP or partner within same practice.   PROVIDER ROLE / SPECIALTY LAST Wendelin Halsted, MD Pulmonary Medicine (Surgeon) Will see patient for procedure only  Mazie Speed, MD Primary Care Provider 05/31/2023  Thais Fill, MD Cardiology 01/02/2023  Alain Aliment, MD Pulmonary Medicine 05/02/2023  Devora Folks, MD Neurology  01/25/2023   Allergies:   Allergies  Allergen Reactions   Penicillins Other (See Comments)    chills   Current Home Medications:   No current facility-administered medications for this encounter.    acetaminophen  (TYLENOL ) 500 MG tablet   albuterol  (VENTOLIN  HFA) 108 (90 Base) MCG/ACT inhaler   aspirin  EC 81 MG tablet   Budeson-Glycopyrrol-Formoterol  (BREZTRI AEROSPHERE) 160-9-4.8 MCG/ACT AERO   fluticasone  (FLONASE ) 50 MCG/ACT nasal spray   hydrochlorothiazide  (HYDRODIURIL ) 25 MG tablet   montelukast (SINGULAIR) 10 MG tablet   rosuvastatin  (CRESTOR ) 5 MG tablet   sodium chloride  (OCEAN) 0.65 % SOLN nasal spray   History:   Past Medical History:  Diagnosis Date   Acute respiratory failure with hypoxia (HCC) 05/08/2020   Anemia    Angina pectoris (HCC)    Aortic atherosclerosis (HCC)    Atrial flutter (HCC)    CAD (coronary artery disease)    Cerebellar infarction (LEFT)    a.) noted on brain MRI performed on 01/06/2017 in setting of acute RIGHT basal ganglia infarction   Cerebral microvascular disease    COPD (chronic obstructive pulmonary disease) (HCC)    Depression    Dissecting aneurysm of descending thoracic aorta (HCC) 05/14/2020   a.) s/p BILATERAL carotid-subclavian transposition and thoracic aortic endovascular stent graft  34mm x 15cm and 34mm x 20cm graft extending from LEFT common carotid to the celiac   Diverticulitis    Dyslipidemia    Dyspnea on exertion    Former cigarette smoker    GERD (gastroesophageal reflux disease)    HCAP (healthcare-associated pneumonia) 05/08/2020   Hemiparesis affecting left side as late effect of stroke (HCC) 01/06/2017   History of bilateral cataract extraction    Hypertension    Infarction of left basal ganglia (HCC)    a.) noted on brain MRI performed on 01/06/2017 in setting of acute contralateral (RIGHT) infarct   Infarction of right basal ganglia (HCC) 01/06/2017   a.) MRI brain 01/06/2017: acute RIGHT basal ganglia  9 x 6 x 13 mm infarct   Long-term use of aspirin  therapy    Neuropathy    OSA on CPAP    Osteoarthritis    Pre-diabetes    Pulmonary nodule, right    Severe obesity (HCC)    TIA (transient ischemic attack)    Past Surgical History:  Procedure Laterality Date   ABDOMINAL SURGERY     CARDIAC CATHETERIZATION     CAROTID-SUBCLAVIAN BYPASS GRAFT Bilateral 05/14/2020   Procedure: BILATERAL CAROTID-SUBCLAVIAN  TRANSPOSITION;  Surgeon: Richrd Char, MD;  Location: [facility] OR;  Service: Vascular;  Laterality: Bilateral;   CATARACT EXTRACTION W/PHACO Left 05/16/2017   Procedure: CATARACT EXTRACTION PHACO AND INTRAOCULAR LENS PLACEMENT (IOC) LEFT;  Surgeon: Annell Kidney, MD;  Location: [facility];  Service: Ophthalmology;  Laterality: Left;   CATARACT EXTRACTION W/PHACO Right 06/13/2017   Procedure: CATARACT EXTRACTION PHACO AND INTRAOCULAR LENS PLACEMENT (IOC) RIGHT;  Surgeon: Annell Kidney, MD;  Location: [facility];  Service: Ophthalmology;  Laterality: Right;  sleep apnea   CHOLECYSTECTOMY     COLONOSCOPY N/A 01/21/2020   Procedure: COLONOSCOPY;  Surgeon: Toledo, Alphonsus Jeans, MD;  Location: [facility] ENDOSCOPY;  Service: Gastroenterology;  Laterality: N/A;   ECTOPIC PREGNANCY SURGERY     REPAIR OF DISSECTING THORACIC AORTIC ANEURYSM, [place] TYPE B     THORACIC AORTIC ENDOVASCULAR STENT GRAFT N/A 05/14/2020   Procedure: THORACIC AORTIC ENDOVASCULAR STENT USING GORE TAG 34mm X 15cm GRAFT AND GORE TAG 34mm X 20cm GRAFT;  Surgeon:  Richrd Char, MD;  Location: [facility] OR;  Service: Vascular;  Laterality: N/A;   ULTRASOUND GUIDANCE FOR VASCULAR ACCESS Bilateral 05/14/2020   Procedure: ULTRASOUND GUIDANCE FOR VASCULAR ACCESS;  Surgeon: Richrd Char, MD;  Location: [facility] OR;  Service: Vascular;  Laterality: Bilateral;   Family History  Problem Relation Age of Onset   Asthma Mother    Heart failure Mother    Hypertension Mother    Heart attack Father    Breast cancer  Maternal Aunt    Breast cancer Cousin        maternal 1st cousin   Social History   Tobacco Use   Smoking status: Former    Current packs/day: 0.00    Average packs/day: 1 pack/day for 53.0 years (53.0 ttl pk-yrs)    Types: Cigarettes    Start date: 06/05/1967    Quit date: 06/04/2020    Years since quitting: 3.0   Smokeless tobacco: Never   Tobacco comments:    Less than 1/2 PPD  Substance Use Topics   Alcohol use: No   Pertinent Clinical Results:  LABS:  Lab Results  Component Value Date   WBC 8.7 05/29/2023   HGB 13.4 05/29/2023   HCT 40.4 05/29/2023   MCV 90.8 05/29/2023   PLT 183 05/29/2023   Lab Results  Component Value Date   NA 140 05/29/2023   CL 107 05/29/2023   K 3.6 05/29/2023   CO2 26 05/29/2023   BUN 13 05/29/2023   CREATININE 0.74 05/29/2023   GFRNONAA >60 05/29/2023   CALCIUM  9.2 05/29/2023   PHOS 3.4 05/02/2020   ALBUMIN  4.0 01/22/2023   GLUCOSE 94 05/29/2023    ECG: Date: 07/05/2023  Time ECG obtained: 01/24/2024 PM Rate: 82 bpm Rhythm: normal sinus Axis (leads I and aVF): normal Intervals: PR 148 ms. QRS 88 ms. QTc 429 ms. ST segment and T wave changes: Anterolateral T wave abnormalities in leads I, aVL, and V2-V6.   Evidence of a possible, age undetermined, prior infarct:  No Comparison: Similar to previous tracing obtained on 05/29/2023   IMAGING / PROCEDURES: CT ANGIO CHEST PE W/CM &/OR WO CM performed on 05/29/2023 No evidence of significant pulmonary embolus. Prominent emphysematous changes throughout the lungs. 8 mm right upper lung nodule is unchanged dating back to 11/27/2017. Long-term stability suggests benign etiology. Descending aortic stent graft. 4.1 cm diameter ascending thoracic aortic aneurysm. Recommend annual imaging followup by CTA or MRA. This recommendation follows 2010 ACCF/AHA/AATS/ACR/ASA/SCA/SCAI/SIR/STS/SVM Guidelines for the Diagnosis and Management of Patients with Thoracic Aortic Disease. Circulation. 2010;  121: A630-Z601. Aortic aneurysm NOS (ICD10-I71.9).  MR BRAIN WO CONTRAST performed on 03/21/2022 No acute or subacute infarction, hemorrhage, hydrocephalus, extra-axial collection or mass lesion.  Chronic small vessel ischemia in the cerebral white matter to a moderate degree.  Chronic perforator infarcts affecting the basal ganglia.  Small chronic bilateral cerebellar infarcts, at least present on the left on prior.  Brain volume remains normal. No acute or subacute insult.   TRANSTHORACIC ECHOCARDIOGRAM performed on 11/14/2021 Normal left ventricular systolic function with EF of >55% No regional wall motion abnormalities Left ventricular diastolic Doppler parameters consistent with abnormal relaxation (G1DD). GLS -18.2% Right ventricular size and function normal Mild mitral annular calcification Trivial TR and PR Mild AR and MR Moderately dilated ascending aorta measuring 3.6 cm  MYOCARDIAL PERFUSION IMAGING STUDY (LEXISCAN) performed on 12/09/2020 Normal left ventricular systolic function with an EF of 66% No regional wall motion abnormalities Left ventricular cavity size normal No evidence  of stress-induced myocardial ischemia or arrhythmia; no scintigraphic evidence of scar TID ratio = 1.02 Normal low risk study  Impression and Plan:  Angel Lane has been referred for pre-anesthesia review and clearance prior to her undergoing the planned anesthetic and procedural courses. Available labs, pertinent testing, and imaging results were personally reviewed by me in preparation for upcoming operative/procedural course. [facility] medical record has been updated following extensive record review and patient interview with PAT staff.   This patient has been appropriately cleared by cardiology with an overall LOW risk of patient experiencing significant perioperative cardiovascular complications. Based on clinical review performed today (07/05/23), barring any significant acute  changes in the patient's overall condition, it is anticipated that she will be able to proceed with the planned surgical intervention. Any acute changes in clinical condition may necessitate her procedure being postponed and/or cancelled. Patient will meet with anesthesia team (MD and/or CRNA) on the day of her procedure for preoperative evaluation/assessment. Questions regarding anesthetic course will be fielded at that time.   Pre-surgical instructions were reviewed with the patient during his PAT appointment, and questions were fielded to satisfaction by PAT clinical staff. She has been instructed on which medications that she will need to hold prior to surgery, as well as the ones that have been deemed safe/appropriate to take on the day of her procedure. As part of the general education provided by PAT, patient made aware both verbally and in writing, that she would need to abstain from the use of any illegal substances during her perioperative course. She was advised that failure to follow the provided instructions could necessitate case cancellation or result in serious perioperative complications up to and including death. Patient encouraged to contact PAT and/or her surgeon's office to discuss any questions or concerns that may arise prior to surgery; verbalized understanding.   Renate Caroline, MSN, APRN, FNP-C, CEN [facility]  Perioperative Services Nurse Practitioner Phone: [phone] Fax: [phone] 07/05/23 10:23 AM  NOTE: This note has been prepared using Dragon dictation software. Despite my best ability to proofread, there is always the potential that unintentional transcriptional errors may still occur from this process.

## 2023-07-06 ENCOUNTER — Ambulatory Visit

## 2023-07-06 ENCOUNTER — Ambulatory Visit: Payer: Self-pay | Admitting: Urgent Care

## 2023-07-06 ENCOUNTER — Encounter
Admission: RE | Disposition: A | Discharge status: Home / Self Care | Payer: Self-pay | Source: Home / Self Care | Attending: Pulmonary Disease

## 2023-07-06 ENCOUNTER — Ambulatory Visit
Admission: RE | Admit: 2023-07-06 | Discharge: 2023-07-06 | Disposition: A | Discharge status: Home / Self Care | Attending: Pulmonary Disease | Admitting: Pulmonary Disease

## 2023-07-06 ENCOUNTER — Other Ambulatory Visit: Payer: Self-pay

## 2023-07-06 DIAGNOSIS — I2510 Atherosclerotic heart disease of native coronary artery without angina pectoris: Secondary | ICD-10-CM | POA: Insufficient documentation

## 2023-07-06 DIAGNOSIS — J439 Emphysema, unspecified: Secondary | ICD-10-CM | POA: Insufficient documentation

## 2023-07-06 DIAGNOSIS — I4892 Unspecified atrial flutter: Secondary | ICD-10-CM | POA: Insufficient documentation

## 2023-07-06 DIAGNOSIS — T17500A Unspecified foreign body in bronchus causing asphyxiation, initial encounter: Secondary | ICD-10-CM | POA: Diagnosis not present

## 2023-07-06 DIAGNOSIS — Z79899 Other long term (current) drug therapy: Secondary | ICD-10-CM | POA: Insufficient documentation

## 2023-07-06 DIAGNOSIS — Z87891 Personal history of nicotine dependence: Secondary | ICD-10-CM | POA: Insufficient documentation

## 2023-07-06 DIAGNOSIS — R911 Solitary pulmonary nodule: Secondary | ICD-10-CM | POA: Insufficient documentation

## 2023-07-06 DIAGNOSIS — I4891 Unspecified atrial fibrillation: Secondary | ICD-10-CM | POA: Insufficient documentation

## 2023-07-06 DIAGNOSIS — I110 Hypertensive heart disease with heart failure: Secondary | ICD-10-CM | POA: Insufficient documentation

## 2023-07-06 DIAGNOSIS — I5032 Chronic diastolic (congestive) heart failure: Secondary | ICD-10-CM | POA: Insufficient documentation

## 2023-07-06 DIAGNOSIS — R590 Localized enlarged lymph nodes: Secondary | ICD-10-CM | POA: Insufficient documentation

## 2023-07-06 DIAGNOSIS — G4733 Obstructive sleep apnea (adult) (pediatric): Secondary | ICD-10-CM | POA: Insufficient documentation

## 2023-07-06 DIAGNOSIS — I25118 Atherosclerotic heart disease of native coronary artery with other forms of angina pectoris: Secondary | ICD-10-CM | POA: Insufficient documentation

## 2023-07-06 DIAGNOSIS — Z8673 Personal history of transient ischemic attack (TIA), and cerebral infarction without residual deficits: Secondary | ICD-10-CM | POA: Insufficient documentation

## 2023-07-06 DIAGNOSIS — K219 Gastro-esophageal reflux disease without esophagitis: Secondary | ICD-10-CM | POA: Insufficient documentation

## 2023-07-06 HISTORY — DX: Gastro-esophageal reflux disease without esophagitis: K21.9

## 2023-07-06 HISTORY — DX: Long term (current) use of aspirin: Z79.82

## 2023-07-06 HISTORY — DX: Other forms of dyspnea: R06.09

## 2023-07-06 HISTORY — PX: VIDEO BRONCHOSCOPY WITH ENDOBRONCHIAL NAVIGATION: SHX6175

## 2023-07-06 HISTORY — DX: Atherosclerotic heart disease of native coronary artery without angina pectoris: I25.10

## 2023-07-06 HISTORY — DX: Unspecified osteoarthritis, unspecified site: M19.90

## 2023-07-06 HISTORY — DX: Cataract extraction status, right eye: Z98.41

## 2023-07-06 HISTORY — DX: Transient cerebral ischemic attack, unspecified: G45.9

## 2023-07-06 HISTORY — DX: Other ill-defined heart diseases: I51.89

## 2023-07-06 HISTORY — DX: Cataract extraction status, left eye: Z98.42

## 2023-07-06 HISTORY — DX: Other cerebrovascular disease: I67.89

## 2023-07-06 HISTORY — PX: VIDEO BRONCHOSCOPY WITH ENDOBRONCHIAL ULTRASOUND: SHX6177

## 2023-07-06 HISTORY — DX: Atherosclerosis of aorta: I70.0

## 2023-07-06 HISTORY — DX: Angina pectoris, unspecified: I20.9

## 2023-07-06 HISTORY — DX: Cerebral infarction, unspecified: I63.9

## 2023-07-06 SURGERY — VIDEO BRONCHOSCOPY WITH ENDOBRONCHIAL NAVIGATION
Anesthesia: General

## 2023-07-06 MED ORDER — ONDANSETRON HCL 4 MG/2ML IJ SOLN
INTRAMUSCULAR | Status: DC | PRN
  Administered 2023-07-06: 4 mg via INTRAVENOUS

## 2023-07-06 MED ORDER — FENTANYL CITRATE (PF) 100 MCG/2ML IJ SOLN
INTRAMUSCULAR | Status: DC | PRN
Start: 2023-07-06 — End: 2023-07-06
  Administered 2023-07-06 (×2): 50 ug via INTRAVENOUS

## 2023-07-06 MED ORDER — ORAL CARE MOUTH RINSE: 15.0000 mL | Freq: Once | OROMUCOSAL | Status: AC

## 2023-07-06 MED ORDER — PHENYLEPHRINE HCL-NACL 20-0.9 MG/250ML-% IV SOLN: INTRAVENOUS | Status: DC | PRN

## 2023-07-06 MED ORDER — PROPOFOL 10 MG/ML IV BOLUS
INTRAVENOUS | Status: DC | PRN
  Administered 2023-07-06: 150 ug/kg/min via INTRAVENOUS
  Administered 2023-07-06: 50 mg via INTRAVENOUS

## 2023-07-06 MED ORDER — OXYCODONE HCL 5 MG/5ML PO SOLN: 5.0000 mg | Freq: Once | ORAL | Status: DC | PRN

## 2023-07-06 MED ORDER — PHENYLEPHRINE HCL-NACL 20-0.9 MG/250ML-% IV SOLN
INTRAVENOUS | Status: DC | PRN
  Administered 2023-07-06: 20 ug/min via INTRAVENOUS

## 2023-07-06 MED ORDER — FENTANYL CITRATE (PF) 100 MCG/2ML IJ SOLN
INTRAMUSCULAR | Status: AC
  Filled 2023-07-06: qty 2

## 2023-07-06 MED ORDER — LIDOCAINE HCL (CARDIAC) PF 100 MG/5ML IV SOSY
PREFILLED_SYRINGE | INTRAVENOUS | Status: DC | PRN
  Administered 2023-07-06: 100 mg via INTRAVENOUS

## 2023-07-06 MED ORDER — ROCURONIUM BROMIDE 100 MG/10ML IV SOLN
INTRAVENOUS | Status: DC | PRN
  Administered 2023-07-06: 20 mg via INTRAVENOUS
  Administered 2023-07-06: 60 mg via INTRAVENOUS

## 2023-07-06 MED ORDER — SUGAMMADEX SODIUM 200 MG/2ML IV SOLN
INTRAVENOUS | Status: DC | PRN
  Administered 2023-07-06: 200 mg via INTRAVENOUS

## 2023-07-06 MED ORDER — PHENYLEPHRINE 80 MCG/ML (10ML) SYRINGE FOR IV PUSH (FOR BLOOD PRESSURE SUPPORT)
PREFILLED_SYRINGE | INTRAVENOUS | Status: DC | PRN
  Administered 2023-07-06: 160 ug via INTRAVENOUS

## 2023-07-06 MED ORDER — OXYCODONE HCL 5 MG PO TABS: 5.0000 mg | ORAL_TABLET | Freq: Once | ORAL | Status: DC | PRN

## 2023-07-06 MED ORDER — CHLORHEXIDINE GLUCONATE 0.12 % MT SOLN
OROMUCOSAL | Status: AC
  Filled 2023-07-06: qty 15

## 2023-07-06 MED ORDER — PROPOFOL 1000 MG/100ML IV EMUL
INTRAVENOUS | Status: AC
  Filled 2023-07-06: qty 100

## 2023-07-06 MED ORDER — GLYCOPYRROLATE 0.2 MG/ML IJ SOLN
INTRAMUSCULAR | Status: DC | PRN
  Administered 2023-07-06: .2 mg via INTRAVENOUS

## 2023-07-06 MED ORDER — SUCCINYLCHOLINE CHLORIDE 200 MG/10ML IV SOSY
PREFILLED_SYRINGE | INTRAVENOUS | Status: DC | PRN
  Administered 2023-07-06: 100 mg via INTRAVENOUS

## 2023-07-06 MED ORDER — LACTATED RINGERS IV SOLN: INTRAVENOUS | Status: DC

## 2023-07-06 MED ORDER — FENTANYL CITRATE (PF) 100 MCG/2ML IJ SOLN: 25.0000 ug | INTRAMUSCULAR | Status: DC | PRN

## 2023-07-06 MED ORDER — CHLORHEXIDINE GLUCONATE 0.12 % MT SOLN
15.0000 mL | Freq: Once | OROMUCOSAL | Status: AC
  Administered 2023-07-06: 15 mL via OROMUCOSAL

## 2023-07-06 MED ORDER — DEXAMETHASONE SODIUM PHOSPHATE 10 MG/ML IJ SOLN
INTRAMUSCULAR | Status: DC | PRN
  Administered 2023-07-06: 10 mg via INTRAVENOUS

## 2023-07-06 NOTE — Anesthesia Preprocedure Evaluation (Signed)
 Anesthesia Evaluation  Patient identified by MRN, date of birth, ID band Patient awake    Reviewed: Allergy & Precautions, NPO status , Patient's Chart, lab work & pertinent test results  History of Anesthesia Complications Negative for: history of anesthetic complications  Airway Mallampati: III  TM Distance: >3 FB Neck ROM: full    Dental  (+) Chipped, Poor Dentition, Missing   Pulmonary sleep apnea , COPD, former smoker    + decreased breath sounds      Cardiovascular Exercise Tolerance: Good hypertension, (-) angina + CAD and + DOE  Normal cardiovascular exam     Neuro/Psych  PSYCHIATRIC DISORDERS      TIACVA (left side), Residual Symptoms    GI/Hepatic Neg liver ROS,GERD  Controlled,,  Endo/Other  negative endocrine ROS    Renal/GU      Musculoskeletal   Abdominal   Peds  Hematology negative hematology ROS (+)   Anesthesia Other Findings Past Medical History: 05/08/2020: Acute respiratory failure with hypoxia (HCC) No date: Anemia No date: Angina pectoris (HCC) No date: Aortic atherosclerosis (HCC) No date: Atrial flutter (HCC) No date: CAD (coronary artery disease) No date: Cerebellar infarction (LEFT)     Comment:  a.) noted on brain MRI performed on 01/06/2017 in               setting of acute RIGHT basal ganglia infarction No date: Cerebral microvascular disease No date: COPD (chronic obstructive pulmonary disease) (HCC) No date: Depression No date: Diastolic dysfunction 05/14/2020: Dissecting aneurysm of descending thoracic aorta (HCC)     Comment:  a.) s/p BILATERAL carotid-subclavian transposition and               thoracic aortic endovascular stent graft  34mm x 15cm and              34mm x 20cm graft extending from LEFT common carotid to               the celiac No date: Diverticulitis No date: Dyslipidemia No date: Dyspnea on exertion No date: Former cigarette smoker No date: GERD  (gastroesophageal reflux disease) 05/08/2020: HCAP (healthcare-associated pneumonia) 01/06/2017: Hemiparesis affecting left side as late effect of stroke  (HCC) No date: History of bilateral cataract extraction No date: Hypertension No date: Infarction of left basal ganglia (HCC)     Comment:  a.) noted on brain MRI performed on 01/06/2017 in               setting of acute contralateral (RIGHT) infarct 01/06/2017: Infarction of right basal ganglia (HCC)     Comment:  a.) MRI brain 01/06/2017: acute RIGHT basal ganglia 9 x               6 x 13 mm infarct No date: Long-term use of aspirin  therapy No date: Neuropathy No date: OSA on CPAP No date: Osteoarthritis No date: Pre-diabetes No date: Pulmonary nodule, right No date: Severe obesity (HCC) No date: TIA (transient ischemic attack)  Past Surgical History: No date: ABDOMINAL SURGERY No date: CARDIAC CATHETERIZATION 05/14/2020: CAROTID-SUBCLAVIAN BYPASS GRAFT; Bilateral     Comment:  Procedure: BILATERAL CAROTID-SUBCLAVIAN  TRANSPOSITION;               Surgeon: Richrd Char, MD;  Location: [facility] OR;                Service: Vascular;  Laterality: Bilateral; 05/16/2017: CATARACT EXTRACTION W/PHACO; Left     Comment:  Procedure: CATARACT EXTRACTION PHACO AND INTRAOCULAR  LENS PLACEMENT (IOC) LEFT;  Surgeon: Annell Kidney, MD;  Location: [facility];  Service:               Ophthalmology;  Laterality: Left 06/13/2017: CATARACT EXTRACTION W/PHACO; Right     Comment:  Procedure: CATARACT EXTRACTION PHACO AND INTRAOCULAR               LENS PLACEMENT (IOC) RIGHT;  Surgeon: Annell Kidney, MD;  Location: [facility];  Service:               Ophthalmology;  Laterality: Right No date: CHOLECYSTECTOMY 01/21/2020: COLONOSCOPY; N/A     Comment:  Procedure: COLONOSCOPY;  Surgeon: Toledo, Alphonsus Jeans, MD;              Location: [facility] ENDOSCOPY;  Service: Gastroenterology;                 Laterality: N/A; No date: ECTOPIC PREGNANCY SURGERY No date: REPAIR OF DISSECTING THORACIC AORTIC ANEURYSM, [place] TYPE  B 05/14/2020: THORACIC AORTIC ENDOVASCULAR STENT GRAFT; N/A     Comment:  Procedure: THORACIC AORTIC ENDOVASCULAR STENT USING GORE              TAG 34mm X 15cm GRAFT AND GORE TAG 34mm X 20cm GRAFT;                Surgeon: Richrd Char, MD;  Location: [facility] OR;                Service: Vascular;  Laterality: N/A; 05/14/2020: ULTRASOUND GUIDANCE FOR VASCULAR ACCESS; Bilateral     Comment:  Procedure: ULTRASOUND GUIDANCE FOR VASCULAR ACCESS;                Surgeon: Richrd Char, MD;  Location: [facility] OR;                Service: Vascular;  Laterality: Bilateral;  BMI    Body Mass Index: 37.25 kg/m      Reproductive/Obstetrics negative OB ROS                             Anesthesia Physical Anesthesia Plan  ASA: 3  Anesthesia Plan: General ETT   Post-op Pain Management:    Induction: Intravenous  PONV Risk Score and Plan: Ondansetron , Dexamethasone , Midazolam  and Treatment may vary due to age or medical condition  Airway Management Planned: Oral ETT  Additional Equipment:   Intra-op Plan:   Post-operative Plan: Extubation in OR  Informed Consent: I have reviewed the patients History and Physical, chart, labs and discussed the procedure including the risks, benefits and alternatives for the proposed anesthesia with the patient or authorized representative who has indicated his/her understanding and acceptance.     Dental Advisory Given  Plan Discussed with: Anesthesiologist, CRNA and Surgeon  Anesthesia Plan Comments: (Patient consented for risks of anesthesia including but not limited to:  - adverse reactions to medications - damage to eyes, teeth, lips or other oral mucosa - nerve damage due to positioning  - sore throat or hoarseness - Damage to heart, brain, nerves, lungs, other parts of body or loss of  life  Patient voiced understanding and assent.)       Anesthesia Quick Evaluation

## 2023-07-06 NOTE — Anesthesia Postprocedure Evaluation (Signed)
 Anesthesia Post Note  Patient: [name]  Procedure(s) Performed: VIDEO BRONCHOSCOPY WITH ENDOBRONCHIAL NAVIGATION BRONCHOSCOPY, WITH EBUS  Patient location during evaluation: PACU Anesthesia Type: General Level of consciousness: awake and alert Pain management: pain level controlled Vital Signs Assessment: post-procedure vital signs reviewed and stable Respiratory status: spontaneous breathing, nonlabored ventilation, respiratory function stable and patient connected to nasal cannula oxygen Cardiovascular status: blood pressure returned to baseline and stable Postop Assessment: no apparent nausea or vomiting Anesthetic complications: no   There were no known notable events for this encounter.   Last Vitals:  Vitals:   07/06/23 1500 07/06/23 1522  BP: (!) 156/72 (!) 157/87  Pulse: 74 81  Resp: 19 18  Temp: 36.6 C 36.4 C  SpO2: (!) 89% 95%    Last Pain:  Vitals:   07/06/23 1522  TempSrc: Temporal  PainSc: 0-No pain                 Portia Brittle Ariza Evans

## 2023-07-06 NOTE — Anesthesia Procedure Notes (Signed)
 Procedure Name: Intubation Date/Time: 07/06/2023 12:43 PM  Performed by: Jean Michaelis., CRNAPre-anesthesia Checklist: Patient identified, Patient being monitored, Timeout performed, Emergency Drugs available and Suction available Patient Re-evaluated:Patient Re-evaluated prior to induction Oxygen Delivery Method: Circle system utilized Preoxygenation: Pre-oxygenation with 100% oxygen Induction Type: IV induction Ventilation: Mask ventilation without difficulty Laryngoscope Size: 3 and McGrath Grade View: Grade I Tube type: Oral Tube size: 8.5 mm Number of attempts: 1 Airway Equipment and Method: Stylet Placement Confirmation: ETT inserted through vocal cords under direct vision, positive ETCO2 and breath sounds checked- equal and bilateral Secured at: 23 cm Tube secured with: Tape Dental Injury: Teeth and Oropharynx as per pre-operative assessment

## 2023-07-06 NOTE — Telephone Encounter (Signed)
 Please the message below and advise

## 2023-07-06 NOTE — Procedures (Signed)
 ROBOTIC  NAVIGATIONAL BRONCHOSCOPY PROCEDURE NOTE  FIBEROPTIC BRONCHOSCOPY WITH THERAPEUTIC ASPIRATION OF TRACHEOBRONCHIAL TREE AND BRONCHOALVEOLAR LAVAGE PROCEDURE NOTE  ENDOBRONCHIAL ULTRASOUND PROCEDURE =/>1 LYMPH NODE NOTE    Flexible bronchoscopy was performed  by : Jaclynn Mast MD  assistance by : 1)Repiratory therapist  and 2) cytotech staff and 3) Anesthesia team and 4) Flouroscopy team and 5) iNTUITsupporting staff   Indication for the procedure was :  Pre-procedural H&P. The following assessment was performed on the day of the procedure prior to initiating sedation History:  Chest pain n Dyspnea y Hemoptysis n Cough y Fever n Other pertinent items n  Examination Vital signs -reviewed as per nursing documentation today Cardiac    Murmurs: n  Rubs : n  Gallop: n Lungs Wheezing: n Rales : n Rhonchi :y  Other pertinent findings: SOB/hypoxemia due to chronic lung disease   Pre-procedural assessment for Procedural Sedation included: Depth of sedation: As per anesthesia team  ASA Classification:  2 Mallampati airway assessment: 3    Medication list reviewed: y  The patient's interval history was taken and revealed: no new complaints The pre- procedure physical examination revealed: No new findings Refer to prior clinic note for details.  Informed Consent: Informed consent was obtained from:  patient after explanation of procedure and risks, benefits, as well as alternative procedures available.  Explanation of level of sedation and possible transfusion was also provided.    Procedural Preparation: Time out was performed and patient was identified by name and birthdate and procedure to be performed and side for sampling, if any, was specified. Pt was intubated by anesthesia.  The patient was appropriately draped.   Fiberoptic bronchoscopy with airway inspection and BAL Procedure findings:  Bronchoscope was inserted via ETT  without difficulty.  Posterior  oropharynx, epiglottis, arytenoids, false cords and vocal cords were not visualized as these were bypassed by endotracheal tube. The distal trachea was normal in circumference and appearance without mucosal, cartilaginous or branching abnormalities.  The main carina was mildly splayed . All right and left lobar airways were visualized to the Subsegmental level.  Sub- sub segmental carinae were identified in all the distal airways.   Secretions were visible in the following airways and appeared to be clear.  The mucosa was : MILDLY FRIABLE AT RUL  Airways were notable for:        exophytic lesions :n       extrinsic compression in the following distributions: n.       Friable mucosa: y       Teacher, music /pigmentation: n     Post procedure Diagnosis:   MUCUS PLUGGING OF BILATERAL AIRWAYS.  THE RUL SUBSEGMENTAL AIRWAY OF POSTERIOR RUL SEGMENT WAS 100% OCCLUDED. BAL WAS PERFORMED AT RIGHT UPPER LOBE. THERAPEUTIC ASPIRATION OF TRACHEOBRONCHIAL TREE WAS PERFORMED BILATERALLY.      Robotic Navigational Bronchoscopy Procedure Findings:  Post appropriate planning and registration peripheral navigation was used to visualize target lesion.     CYTOBRUSH X 1 RUL - ENDOBRONCHIAL - CYTOLOGY FNA X 3 - TRANSBRONCHIAL - CYTOLOGY  [facility] ENDOBRONCHIAL X 4 - CYTOLOGY       Photos  Needle in lesion RUL- confirmation  07/06/2023 15:00       Endobronchial ultrasound assisted hilar and mediastinal lymph node biopsies procedure findings: The fiberoptic bronchoscope was removed and the EBUS scope was introduced. Examination began to evaluate for pathologically enlarged lymph nodes starting on the left  side progressing to the right side.  All lymph  node biopsies performed with 21g needle. Lymph node biopsies were sent in cytolite for all stations.  Station 10L - 6mm - not biopsied  Station 7 - 1.7cm - biopsied 3 times  Station 10R - 6mm - not biopsied  Station 4R - 6mm  not biopsied    Post procedure diagnosis:  - lymphadenopathy   Immediate sampling complications included:none  Epinephrine  ZERO ml was used topically  The bronchoscopy was terminated due to completion of the planned procedure and the bronchoscope was removed.   Total dosage of Lidocaine  was 3mg  Total fluoroscopy time was AS PER RADIOLOGY  minutes  Supplemental oxygen was provided at AS PER ANESTHESIA lpm by nasal canula post operatively  Estimated Blood loss: XPECTED <5cc.  Complications included:  NONE IMMEDIATE   Preliminary CXR findings :  IN PROCESS   Disposition: HOME WITH HUSBAND   Follow up with Dr. Terriona Horlacher in 5 days for result discussion.     Erskin Hearing MD  [facility] Division of Pulmonary & Critical Care Medicine

## 2023-07-06 NOTE — Transfer of Care (Signed)
 Immediate Anesthesia Transfer of Care Note  Patient: Angel Lane  Procedure(s) Performed: VIDEO BRONCHOSCOPY WITH ENDOBRONCHIAL NAVIGATION BRONCHOSCOPY, WITH EBUS  Patient Location: PACU  Anesthesia Type:General  Level of Consciousness: awake, alert , and oriented  Airway & Oxygen Therapy: Patient Spontanous Breathing  Post-op Assessment: Report given to RN and Post -op Vital signs reviewed and stable  Post vital signs: stable  Last Vitals:  Vitals Value Taken Time  BP 140/88 07/06/23 1420  Temp    Pulse 82 07/06/23 1423  Resp 16 07/06/23 1423  SpO2 93 % 07/06/23 1423  Vitals shown include unfiled device data.  Last Pain:  Vitals:   07/06/23 1050  TempSrc: Temporal  PainSc: 0-No pain         Complications: No notable events documented.

## 2023-07-06 NOTE — H&P (Signed)
 PULMONOLOGY         Date: 07/06/2023,   MRN# [id number] Angel Lane November 06, 1950     AdmissionWeight: 98.4 kg                 CurrentWeight: 98.4 kg     CHIEF COMPLAINT:   Right upper lobe enlarging lung nodule   HISTORY OF PRESENT ILLNESS   This is 73 year old female with a history of chronic anemia, chronic angina, arterial sclerosis, atrial fibrillation, coronary artery disease, previous cerebellar infarction, COPD with episodes of transient hypoxemia, depression, diastolic CHF history of aneurysmal dissection, GERD, OSA on CPAP, obesity, she was seen in the clinic with [facility] pulmonology and noted to have enlarging right upper lobe spiculated nodule will be lung cancer screening program.  This lung nodule measures approximately 8 mm and was previously 6 mm with noted enlargement over time.  Today she is here for biopsy due to moderate pretest probability of primary bronchogenic carcinoma.  Will plan on utilizing robotic bronchoscopy with navigation, therapeutic aspiration of mucous plugging and bronchoalveolar lavage as well as endobronchial ultrasound to evaluate lymph node stations.  Patient does not report any new medical issues since last seen in clinic.  Reviewed risks/complications and benefits with patient, risks include infection, pneumothorax/pneumomediastinum which may require chest tube placement as well as overnight/prolonged hospitalization and possible mechanical ventilation. Other risks include bleeding and very rarely death.  Patient understands risks and wishes to proceed.  Additional questions were answered, and patient is aware that post procedure patient will be going home with family and may experience cough with possible clots on expectoration as well as phlegm which may last few days as well as hoarseness of voice post intubation and mechanical ventilation.    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   a.) s/p BILATERAL carotid-subclavian transposition and thoracic aortic endovascular stent graft  34mm x 15cm and 34mm x 20cm graft extending from LEFT common carotid to the celiac   Diverticulitis    Dyslipidemia    Dyspnea on exertion    Former cigarette smoker    GERD (gastroesophageal reflux disease)    HCAP (healthcare-associated pneumonia) 05/08/2020   Hemiparesis affecting left side as late effect of stroke (HCC) 01/06/2017   History of bilateral cataract extraction    Hypertension    Infarction of left basal ganglia (HCC)    a.) noted on brain MRI performed on 01/06/2017 in setting of acute contralateral (RIGHT) infarct   Infarction of right basal ganglia (HCC) 01/06/2017   a.) MRI brain 01/06/2017: acute RIGHT basal ganglia 9 x 6 x 13 mm infarct   Long-term use of aspirin  therapy    Neuropathy    OSA on CPAP    Osteoarthritis    Pre-diabetes    Pulmonary nodule, right    Severe obesity (HCC)    TIA (transient ischemic attack)  SURGICAL HISTORY   Past Surgical History:  Procedure Laterality Date   ABDOMINAL SURGERY     CARDIAC CATHETERIZATION     CAROTID-SUBCLAVIAN BYPASS GRAFT Bilateral 05/14/2020   Procedure: BILATERAL CAROTID-SUBCLAVIAN  TRANSPOSITION;  Surgeon: Richrd Char, MD;  Location: [facility] OR;  Service: Vascular;  Laterality: Bilateral;   CATARACT EXTRACTION W/PHACO Left 05/16/2017   Procedure: CATARACT EXTRACTION PHACO AND INTRAOCULAR LENS PLACEMENT (IOC) LEFT;   Surgeon: Annell Kidney, MD;  Location: [facility];  Service: Ophthalmology;  Laterality: Left   CATARACT EXTRACTION W/PHACO Right 06/13/2017   Procedure: CATARACT EXTRACTION PHACO AND INTRAOCULAR LENS PLACEMENT (IOC) RIGHT;  Surgeon: Annell Kidney, MD;  Location: [facility];  Service: Ophthalmology;  Laterality: Right   CHOLECYSTECTOMY     COLONOSCOPY N/A 01/21/2020   Procedure: COLONOSCOPY;  Surgeon: Toledo, Alphonsus Jeans, MD;  Location: [facility] END[facility]COPY;  Service: Gastroenterology;  Laterality: N/A;   ECTOPIC PREGNANCY SURGERY     REPAIR OF DISSECTING THORACIC AORTIC ANEURYSM, [place] TYPE B     THORACIC AORTIC ENDOVASCULAR STENT GRAFT N/A 05/14/2020   Procedure: THORACIC AORTIC ENDOVASCULAR STENT USING GORE TAG 34mm X 15cm GRAFT AND GORE TAG 34mm X 20cm GRAFT;  Surgeon: Richrd Char, MD;  Location: [facility] OR;  Service: Vascular;  Laterality: N/A;   ULTRASOUND GUIDANCE FOR VASCULAR ACCESS Bilateral 05/14/2020   Procedure: ULTRASOUND GUIDANCE FOR VASCULAR ACCESS;  Surgeon: Richrd Char, MD;  Location: [facility] OR;  Service: Vascular;  Laterality: Bilateral;     FAMILY HISTORY   Family History  Problem Relation Age of Onset   Asthma Mother    Heart failure Mother    Hypertension Mother    Heart attack Father    Breast cancer Maternal Aunt    Breast cancer Cousin        maternal 1st cousin     SOCIAL HISTORY   Social History   Tobacco Use   Smoking status: Former    Current packs/day: 0.00    Average packs/day: 1 pack/day for 53.0 years (53.0 ttl pk-yrs)    Types: Cigarettes    Start date: 06/05/1967    Quit date: 06/04/2020    Years since quitting: 3.0   Smokeless tobacco: Never   Tobacco comments:    Less than 1/2 PPD  Vaping Use   Vaping status: Former  Substance Use Topics   Alcohol use: No   Drug use: No     MEDICATIONS    Home Medication:    Current Medication:  Current Facility-Administered Medications:    lactated ringers   infusion, , Intravenous, Continuous, Baltazar Bonier, MD, Last Rate: 10 mL/hr at 07/06/23 1112, New Bag at 07/06/23 1112    ALLERGIES   Penicillins     REVIEW OF SYSTEMS    Review of Systems:  Gen:  Denies  fever, sweats, chills weigh loss  HEENT: Denies blurred vision, double vision, ear pain, eye pain, hearing loss, nose bleeds, sore throat Cardiac:  No dizziness, chest pain or heaviness, chest tightness,edema Resp:   reports dyspnea chronically  Gi: Denies swallowing difficulty, stomach pain, nausea or vomiting, diarrhea, constipation, bowel incontinence Gu:  Denies bladder incontinence, burning urine Ext:   Denies Joint pain, stiffness or swelling Skin: Denies  skin rash, easy bruising or bleeding or hives Endoc:  Denies polyuria, polydipsia , polyphagia or weight change Psych:   Denies depression, insomnia or hallucinations   Other:  All other systems negative   VS: BP (!) 157/80   Pulse 83  Temp (!) 97.3 F (36.3 C) (Temporal)   Resp 16   Ht 5\' 4"  (1.626 m)   Wt 98.4 kg   SpO2 98%   BMI 37.25 kg/m      PHYSICAL EXAM    GENERAL:NAD, no fevers, chills, no weakness no fatigue HEAD: Normocephalic, atraumatic.  EYES: Pupils equal, round, reactive to light. Extraocular muscles intact. No scleral icterus.  MOUTH: Moist mucosal membrane. Dentition intact. No abscess noted.  EAR, NOSE, THROAT: Clear without exudates. No external lesions.  NECK: Supple. No thyromegaly. No nodules. No JVD.  PULMONARY: decreased breath sounds with mild rhonchi worse at bases bilaterally.  CARDIOVASCULAR: S1 and S2. Regular rate and rhythm. No murmurs, rubs, or gallops. No edema. Pedal pulses 2+ bilaterally.  GASTROINTESTINAL: Soft, nontender, nondistended. No masses. Positive bowel sounds. No hepatosplenomegaly.  MUSCULOSKELETAL: No swelling, clubbing, or edema. Range of motion full in all extremities.  NEUROLOGIC: Cranial nerves II through XII are intact. No gross focal  neurological deficits. Sensation intact. Reflexes intact.  SKIN: No ulceration, lesions, rashes, or cyanosis. Skin warm and dry. Turgor intact.  PSYCHIATRIC: Mood, affect within normal limits. The patient is awake, alert and oriented x 3. Insight, judgment intact.       IMAGING   Narrative & Impression  CLINICAL DATA:  Former 50 pack-year smoker, quit 2022.   EXAM: CT CHEST WITHOUT CONTRAST LOW-DOSE FOR LUNG CANCER SCREENING   TECHNIQUE: Multidetector CT imaging of the chest was performed following the standard protocol without IV contrast.   RADIATION DOSE REDUCTION: This exam was performed according to the departmental dose-optimization program which includes automated exposure control, adjustment of the mA and/or kV according to patient size and/or use of iterative reconstruction technique.   COMPARISON:  03/18/2022 and 11/27/2017.   FINDINGS: Cardiovascular: Aberrant right subclavian artery. Endovascular stent graft in the descending thoracic aorta. Enlarged pulmonic trunk and heart. No pericardial effusion.   Mediastinum/Nodes: No pathologically enlarged mediastinal or axillary lymph nodes. Hilar regions are difficult to definitively evaluate without IV contrast. Air in the esophagus can be seen with dysmotility.   Lungs/Pleura: Centrilobular and paraseptal emphysema. 8.6 mm anterior right upper lobe nodule (3/87) has enlarged slightly from 6.0 mm on 11/27/2017. Calcified granulomas. 5.7 mm subpleural left upper lobe nodule, unchanged. No new pulmonary nodules. No pleural fluid. Airway is unremarkable.   Upper Abdomen: Hypodense lesion in the dome of the left hepatic lobe measures 2.7 x 3.8 cm, smaller than on 03/18/2022, likely a minimally complex cyst. Liver is mildly heterogeneous. Cholecystectomy. Visualized portions of the liver, adrenal glands, kidneys, spleen, pancreas, stomach and bowel are otherwise grossly unremarkable. No upper abdominal adenopathy.    Musculoskeletal: Degenerative changes in the spine.   IMPRESSION: 1. Indolent enlargement of an anterior segment right upper lobe nodule, measuring 8.6 mm. By size criteria, study is categorized as Lung-RADS 4B, suspicious. Additional imaging evaluation or consultation with Pulmonology or Thoracic Surgery recommended. These results will be called to the ordering clinician or representative by the Radiologist Assistant, and communication documented in the PACS or [employer]. 2. Liver appears mildly steatotic. 3. Enlarged pulmonic trunk, indicative of pulmonary arterial hypertension. 4.  Emphysema (ICD10-J43.9).     Electronically Signed   By: Shearon Denis M.D.   On: 06/25/2023 13:20      ASSESSMENT/PLAN   Right upper lobe enlarging nodule  Plan for navigation bronchoscopy for biopsy - initially performing airway inspection with BAL and theraputic aspiration. Will place fiducial maker if necessary. Endobronchial US  to evaluate LN  stations -Reviewed risks/complications and benefits with patient, risks include infection, pneumothorax/pneumomediastinum which may require chest tube placement as well as overnight/prolonged hospitalization and possible mechanical ventilation. Other risks include bleeding and very rarely death.  Patient understands risks and wishes to proceed.  Additional questions were answered, and patient is aware that post procedure patient will be going home with family and may experience cough with possible clots on expectoration as well as phlegm which may last few days as well as hoarseness of voice post intubation and mechanical ventilation.             Thank you for allowing me to participate in the care of this patient.   Patient/Family are satisfied with care plan and all questions have been answered.    Provider disclosure: Patient with at least one acute or chronic illness or injury that poses a threat to life or bodily function and is  being managed actively during this encounter.  All of the below services have been performed independently by signing provider:  review of prior documentation from internal and or external health records.  Review of previous and current lab results.  Interview and comprehensive assessment during patient visit today. Review of current and previous chest radiographs/CT scans. Discussion of management and test interpretation with health care team and patient/family.   This document was prepared using Dragon voice recognition software and may include unintentional dictation errors.     Carlesha Seiple, M.D.  Division of Pulmonary & Critical Care Medicine

## 2023-07-07 ENCOUNTER — Encounter: Payer: Self-pay | Admitting: Pulmonary Disease

## 2023-07-09 LAB — SURGICAL PATHOLOGY

## 2023-07-09 LAB — CYTOLOGY - NON PAP

## 2023-07-09 NOTE — Telephone Encounter (Signed)
 Just received today. Past the day of her bronch, so moot point now. Can reassess her statin at her next visit

## 2023-07-24 ENCOUNTER — Ambulatory Visit: Payer: Self-pay | Admitting: Family Medicine

## 2023-08-02 NOTE — Telephone Encounter (Signed)
 Angel Lane

## 2023-08-14 ENCOUNTER — Encounter: Payer: Self-pay | Admitting: Family Medicine

## 2023-08-14 ENCOUNTER — Ambulatory Visit: Admitting: Family Medicine

## 2023-08-14 VITALS — BP 130/68 | HR 91 | Ht 64.0 in | Wt 214.7 lb

## 2023-08-14 DIAGNOSIS — I10 Essential (primary) hypertension: Secondary | ICD-10-CM | POA: Diagnosis not present

## 2023-08-14 DIAGNOSIS — Z8673 Personal history of transient ischemic attack (TIA), and cerebral infarction without residual deficits: Secondary | ICD-10-CM | POA: Diagnosis not present

## 2023-08-14 DIAGNOSIS — E66812 Obesity, class 2: Secondary | ICD-10-CM

## 2023-08-14 DIAGNOSIS — G8194 Hemiplegia, unspecified affecting left nondominant side: Secondary | ICD-10-CM

## 2023-08-14 DIAGNOSIS — Z6837 Body mass index (BMI) 37.0-37.9, adult: Secondary | ICD-10-CM

## 2023-08-14 DIAGNOSIS — E7800 Pure hypercholesterolemia, unspecified: Secondary | ICD-10-CM | POA: Diagnosis not present

## 2023-08-14 DIAGNOSIS — I7121 Aneurysm of the ascending aorta, without rupture: Secondary | ICD-10-CM

## 2023-08-14 DIAGNOSIS — G4733 Obstructive sleep apnea (adult) (pediatric): Secondary | ICD-10-CM

## 2023-08-14 DIAGNOSIS — R0789 Other chest pain: Secondary | ICD-10-CM

## 2023-08-14 DIAGNOSIS — R7303 Prediabetes: Secondary | ICD-10-CM

## 2023-08-14 DIAGNOSIS — I71019 Dissection of thoracic aorta, unspecified: Secondary | ICD-10-CM

## 2023-08-14 DIAGNOSIS — E876 Hypokalemia: Secondary | ICD-10-CM

## 2023-08-14 NOTE — Progress Notes (Unsigned)
      Established patient visit   Patient: Angel Lane   DOB: [birth year]   73 y.o. Female  MRN: [id number] Visit Date: 08/14/2023  Today's healthcare provider: Aden Agreste, MD   Chief Complaint  Patient presents with  . Annual Exam    Diet -  General, well balanced Exercise - walking and stationary machine every other day at least 20 minutes Feeling - fairly well Sleeping - fairly well with use of cpap Concerns - Chest discomfort and under her discomfort under right breast X 1 month, cramping in fingers and toes  . Care Management    Lung Cancer Screening completed 05/18/23 Zoster Vaccine - declined   Subjective    HPI HPI     Annual Exam    Additional comments: Diet -  General, well balanced Exercise - walking and stationary machine every other day at least 20 minutes Feeling - fairly well Sleeping - fairly well with use of cpap Concerns - Chest discomfort and under her discomfort under right breast X 1 month, cramping in fingers and toes        Care Management    Additional comments: Lung Cancer Screening completed 05/18/23 Zoster Vaccine - declined      Last edited by Pasty Bongo, CMA on 08/14/2023  1:13 PM.       Discussed the use of AI scribe software for clinical note transcription with the patient, who gave verbal consent to proceed.  History of Present Illness             Medications: Outpatient Medications Prior to Visit  Medication Sig  . acetaminophen  (TYLENOL ) 500 MG tablet Take 500 mg by mouth every 6 (six) hours as needed for moderate pain.  . albuterol  (VENTOLIN  HFA) 108 (90 Base) MCG/ACT inhaler 2 puffs every 4 (four) hours as needed for wheezing or shortness of breath.  . aspirin  EC 81 MG tablet Take 1 tablet (81 mg total) by mouth daily. Swallow whole.  . Budeson-Glycopyrrol-Formoterol  (BREZTRI AEROSPHERE) 160-9-4.8 MCG/ACT AERO Inhale 2 puffs into the lungs 2 (two) times daily.  . fluticasone  (FLONASE ) 50 MCG/ACT nasal  spray Place 2 sprays into both nostrils daily.  . hydrochlorothiazide  (HYDRODIURIL ) 25 MG tablet Take 25 mg by mouth as needed.  . montelukast (SINGULAIR) 10 MG tablet Take 10 mg by mouth at bedtime.  . rosuvastatin  (CRESTOR ) 5 MG tablet TAKE 1 TABLET (5 MG TOTAL) BY MOUTH DAILY.  . sodium chloride  (OCEAN) 0.65 % SOLN nasal spray Place 1 spray into both nostrils as needed for congestion.   No facility-administered medications prior to visit.    Review of Systems {Insert previous labs (optional):23779} {See past labs  Heme  Chem  Endocrine  Serology  Results Review (optional):1}   Objective    BP 130/68 (BP Location: Right Arm, Patient Position: Sitting, Cuff Size: Large)   Pulse 91   Ht 5\' 4"  (1.626 m)   Wt 214 lb 11.2 oz (97.4 kg)   SpO2 99%   BMI 36.85 kg/m  {Insert last BP/Wt (optional):23777}{See vitals history (optional):1}  Physical Exam   No results found for any visits on 08/14/23.  Assessment & Plan     Problem List Items Addressed This Visit   None                No follow-ups on file.       Aden Agreste, MD  [facility] [phone] (phone) [phone] (fax)  [facility]

## 2023-08-15 ENCOUNTER — Ambulatory Visit: Payer: Self-pay | Admitting: Family Medicine

## 2023-08-15 LAB — COMPREHENSIVE METABOLIC PANEL WITH GFR
ALT: 23 IU/L (ref 0–32)
AST: 21 IU/L (ref 0–40)
Albumin: 4 g/dL (ref 3.8–4.8)
Alkaline Phosphatase: 102 IU/L (ref 44–121)
BUN/Creatinine Ratio: 20 (ref 12–28)
BUN: 16 mg/dL (ref 8–27)
Bilirubin Total: 0.7 mg/dL (ref 0.0–1.2)
CO2: 24 mmol/L (ref 20–29)
Calcium: 9.7 mg/dL (ref 8.7–10.3)
Chloride: 98 mmol/L (ref 96–106)
Creatinine, Ser: 0.82 mg/dL (ref 0.57–1.00)
Globulin, Total: 2.7 g/dL (ref 1.5–4.5)
Glucose: 97 mg/dL (ref 70–99)
Potassium: 3.8 mmol/L (ref 3.5–5.2)
Sodium: 140 mmol/L (ref 134–144)
Total Protein: 6.7 g/dL (ref 6.0–8.5)
eGFR: 75 mL/min/{1.73_m2} (ref >59)

## 2023-08-15 LAB — LIPID PANEL
Chol/HDL Ratio: 3.1 ratio (ref 0.0–4.4)
Cholesterol, Total: 160 mg/dL (ref 100–199)
HDL: 51 mg/dL (ref >39)
LDL Chol Calc (NIH): 95 mg/dL (ref 0–99)
Triglycerides: 73 mg/dL (ref 0–149)
VLDL Cholesterol Cal: 14 mg/dL (ref 5–40)

## 2023-08-15 LAB — HEMOGLOBIN A1C
Est. average glucose Bld gHb Est-mCnc: 114 mg/dL
Hgb A1c MFr Bld: 5.6 % (ref 4.8–5.6)

## 2023-08-15 MED ORDER — ROSUVASTATIN CALCIUM 10 MG PO TABS: 10.0000 mg | ORAL_TABLET | Freq: Every day | ORAL | 3 refills | Status: AC

## 2023-08-15 NOTE — Assessment & Plan Note (Signed)
 Chronic and stable History of stroke

## 2023-08-15 NOTE — Assessment & Plan Note (Signed)
 She is on rosuvastatin  for hyperlipidemia. Last cholesterol check was seven months ago. - Order repeat labs to check cholesterol levels.

## 2023-08-15 NOTE — Assessment & Plan Note (Signed)
 4.1 cm ascending thoracic aortic aneurysm. Current guidelines support annual imaging based on aneurysm size and growth rate. She expressed concern about the one-year follow-up interval for imaging, preferring a six-month interval due to anxiety about potential growth or dissection. - Reassure her about the standard one-year follow-up interval for imaging based on current guidelines. - continue to follow with [facility]

## 2023-08-15 NOTE — Assessment & Plan Note (Signed)
 Discussed importance of healthy weight management Discussed diet and exercise

## 2023-08-15 NOTE — Assessment & Plan Note (Signed)
 Severe OSA with an AHI of 62.1, worse during REM sleep (AHI up to 96/hour). Current CPAP at 9 cm H2O, but reports shortness of breath upon waking. Previous titration study recommended 12 cm H2O. Discussed potential benefits of auto-titrating CPAP for improved comfort and efficacy. - Contact pulmonologist to discuss auto-titrating CPAP - Document concerns and current CPAP settings

## 2023-08-15 NOTE — Assessment & Plan Note (Signed)
 Blood pressure is well controlled with current medication regimen. She is consuming watermelon to help manage blood pressure but advised to be cautious due to its sugar content.

## 2023-08-15 NOTE — Assessment & Plan Note (Signed)
 She is aware of prediabetes status and is monitoring dietary intake. Advised to be cautious with watermelon due to its sugar content. - Order repeat labs to check A1c levels.

## 2023-08-15 NOTE — Assessment & Plan Note (Signed)
S/p VVS repair ?Encouraged patient to continue to follow with vascular surgery and get her routine imaging to continue to monitor ?

## 2023-08-15 NOTE — Assessment & Plan Note (Signed)
Continue risk factor management. 

## 2023-08-29 ENCOUNTER — Ambulatory Visit: Payer: Self-pay

## 2023-08-29 NOTE — Telephone Encounter (Signed)
 Pt should be seen to proper evaluation of symptoms.

## 2023-08-29 NOTE — Telephone Encounter (Signed)
  FYI Only or Action Required?: FYI only for provider.  Patient was last seen in primary care on 08/14/2023 by Myrla Jon HERO, MD. Called Nurse Triage reporting No chief complaint on file.. Symptoms began x 2 days ago. Interventions attempted: Rest, hydration, or home remedies. Symptoms are: gradually worsening.  Triage Disposition: See Physician Within 24 Hours: pt would like to request Rx from PCP instead of scheduling an appointment at present time.  Patient/caregiver understands and will follow disposition?: Yes   Copied from CRM [id number]. Topic: Clinical - Red Word Triage >> Aug 29, 2023  9:02 AM Treva T wrote: Red Word that prompted transfer to Nurse Triage: Patient calling states she is having a COPD flare up, increased cough, productive with yellow mucous. Reason for Disposition  SEVERE coughing spells (e.g., whooping sound after coughing, vomiting after coughing)  Answer Assessment - Initial Assessment Questions 1. ONSET: When did the cough begin?      X 2 days 2. SEVERITY: How bad is the cough today?      Mild to moderate 3. SPUTUM: Describe the color of your sputum (none, dry cough; clear, white, yellow, green)     Yellow mucous 4. HEMOPTYSIS: Are you coughing up any blood? If so ask: How much? (flecks, streaks, tablespoons, etc.)     no 5. DIFFICULTY BREATHING: Are you having difficulty breathing? If Yes, ask: How bad is it? (e.g., mild, moderate, severe)    - MILD: No SOB at rest, mild SOB with walking, speaks normally in sentences, can lie down, no retractions, pulse < 100.    - MODERATE: SOB at rest, SOB with minimal exertion and prefers to sit, cannot lie down flat, speaks in phrases, mild retractions, audible wheezing, pulse 100-120.    - SEVERE: Very SOB at rest, speaks in single words, struggling to breathe, sitting hunched forward, retractions, pulse > 120      no 6. FEVER: Do you have a fever? If Yes, ask: What is your temperature, how was it  measured, and when did it start?     no 7. CARDIAC HISTORY: Do you have any history of heart disease? (e.g., heart attack, congestive heart failure)      Stroke [id number] 8. LUNG HISTORY: Do you have any history of lung disease?  (e.g., pulmonary embolus, asthma, emphysema)     COPD  9. PE RISK FACTORS: Do you have a history of blood clots? (or: recent major surgery, recent prolonged travel, bedridden)     no 10. OTHER SYMPTOMS: Do you have any other symptoms? (e.g., runny nose, wheezing, chest pain)       no 11. PREGNANCY: Is there any chance you are pregnant? When was your last menstrual period?       N/a 12. TRAVEL: Have you traveled out of the country in the last month? (e.g., travel history, exposures)       N/a  Pt stated COPD flare up: pt is requesting prednisone  to help with COPD flare up before this gets worse: pt stated she is trying to stay out of the ED  Protocols used: Cough - Acute Productive-A-AH

## 2023-08-31 ENCOUNTER — Other Ambulatory Visit: Payer: Self-pay | Admitting: Family Medicine

## 2023-08-31 DIAGNOSIS — J441 Chronic obstructive pulmonary disease with (acute) exacerbation: Secondary | ICD-10-CM

## 2023-08-31 MED ORDER — AZITHROMYCIN 250 MG PO TABS: ORAL_TABLET | ORAL | 0 refills | Status: AC

## 2023-08-31 MED ORDER — METHYLPREDNISOLONE 4 MG PO TBPK: ORAL_TABLET | ORAL | 0 refills | Status: DC

## 2023-10-31 ENCOUNTER — Other Ambulatory Visit: Payer: Self-pay

## 2023-10-31 DIAGNOSIS — Z95828 Presence of other vascular implants and grafts: Secondary | ICD-10-CM

## 2023-11-27 ENCOUNTER — Ambulatory Visit: Payer: Self-pay

## 2023-11-27 ENCOUNTER — Ambulatory Visit: Admitting: Family Medicine

## 2023-11-27 ENCOUNTER — Encounter: Payer: Self-pay | Admitting: Family Medicine

## 2023-11-27 VITALS — BP 131/80 | HR 80 | Resp 16 | Ht 64.0 in | Wt 213.7 lb

## 2023-11-27 DIAGNOSIS — R220 Localized swelling, mass and lump, head: Secondary | ICD-10-CM

## 2023-11-27 DIAGNOSIS — R252 Cramp and spasm: Secondary | ICD-10-CM | POA: Diagnosis not present

## 2023-11-27 DIAGNOSIS — R42 Dizziness and giddiness: Secondary | ICD-10-CM

## 2023-11-27 DIAGNOSIS — I10 Essential (primary) hypertension: Secondary | ICD-10-CM | POA: Diagnosis not present

## 2023-11-27 MED ORDER — PREDNISONE 10 MG PO TABS: ORAL_TABLET | ORAL | 0 refills | Status: DC

## 2023-11-27 NOTE — Progress Notes (Signed)
 Acute visit   Patient: Angel Lane   DOB: 1950-03-11   73 y.o. Female  MRN: [id number] PCP: Myrla Jon CHRISTELLA, MD   Chief Complaint  Patient presents with   Eye Problem    Facial reaction; swelling, bilateral eye swelling. Patient thinks reaction is due to her medications. Onset yesterday morning and getting worse. Has not started a new medication in the last week    Loss of Consciousness    Pt states of when laying down feels like she is going to pass out x 1week   Subjective    Discussed the use of AI scribe software for clinical note transcription with the patient, who gave verbal consent to proceed.  History of Present Illness   Angel Lane is a 73 year old female with hypertension and an ascending aortic aneurysm who presents with facial swelling and dizziness.  Facial swelling began yesterday morning, worsening overnight, especially around the eyes and face. She denies new foods, medications, or products but recently started using disinfectant pads on her CPAP mask, which she suspects as a cause. She has not experienced similar swelling before.  Dizziness occurs when lying flat, described as a 'terrible feeling' and feeling like she is 'leaving this earth.' There is no sensation of the room spinning, but she experiences blurred vision and occasional flashes of light. No shortness of breath or headaches are associated. Dizziness is not related to standing up quickly, as lightheadedness occurs only when lying flat.  She experiences leg cramps, particularly in her feet, which she associates with taking hydrochlorothiazide . She takes the medication every other day to manage the cramps.  Hoarseness occurs after using her inhalers, which include Spiriva and Brevstreet. Spiriva alone did not alleviate her symptoms.      Review of Systems   Objective    BP 131/80   Pulse 80   Resp 16   Ht 5' 4 (1.626 m)   Wt 213 lb 11.2 oz (96.9 kg)   SpO2 100%   BMI 36.68  kg/m   Physical Exam Vitals reviewed.  Constitutional:      General: She is not in acute distress.    Appearance: Normal appearance. She is well-developed. She is not diaphoretic.  HENT:     Head:     Comments: Facial swelling, particularly cheeks and around eyes Eyes:     General: No scleral icterus.    Conjunctiva/sclera: Conjunctivae normal.  Neck:     Thyroid: No thyromegaly.  Cardiovascular:     Rate and Rhythm: Normal rate and regular rhythm.     Heart sounds: Normal heart sounds. No murmur heard. Pulmonary:     Effort: Pulmonary effort is normal. No respiratory distress.     Breath sounds: Normal breath sounds. No wheezing, rhonchi or rales.  Musculoskeletal:     Cervical back: Neck supple.     Right lower leg: No edema.     Left lower leg: No edema.  Lymphadenopathy:     Cervical: No cervical adenopathy.  Skin:    General: Skin is warm and dry.     Findings: No rash.  Neurological:     Mental Status: She is alert and oriented to person, place, and time. Mental status is at baseline.  Psychiatric:        Mood and Affect: Mood normal.        Behavior: Behavior normal.       No results found for any visits on 11/27/23.  Assessment & Plan     Problem List Items Addressed This Visit       Cardiovascular and Mediastinum   HTN (hypertension)   Relevant Orders   Basic Metabolic Panel (BMET)   Magnesium    Other Visit Diagnoses       Facial swelling    -  Primary     Leg cramps       Relevant Orders   Basic Metabolic Panel (BMET)   Magnesium      Vertigo              Facial swelling, likely allergic reaction Acute facial swelling, particularly around the eyes, since yesterday morning. No new foods or medications identified, but recent use of disinfectant pads on CPAP mask may be a potential allergen. No rash present. - Prescribe prednisone  taper: 60 mg, then 50 mg, 40 mg, 30 mg, 20 mg, 10 mg, then off - Advise to stop using disinfectant wipes on CPAP  mask - Instruct to take prednisone  in the morning with food - Advise to seek emergency care if swelling of tongue or throat occurs  Dizziness, likely vertigo Dizziness described as a 'fancy feeling' when lying flat, not associated with room spinning or orthostatic changes. Symptoms suggestive of vertigo, possibly related to inner ear disturbances. No blurred vision or significant headaches associated with dizziness. - Provide instructions for Epley maneuver to perform at home - Print instructions for Epley maneuver  Leg cramps, possibly due to hydrochlorothiazide  Intermittent leg cramps potentially related to hydrochlorothiazide  use. Cramps occur every time hydrochlorothiazide  is taken. Electrolyte imbalance suspected as a contributing factor. - Order lab tests to check electrolytes, kidney function, and magnesium  levels - Evaluate lab results before considering changes to blood pressure medication  Hypertension in the setting of ascending aortic aneurysm, diastolic dysfunction, and aortic valve insufficiency Hypertension well-controlled with current regimen. Ascending aortic aneurysm, diastolic dysfunction, and mild to moderate aortic valve insufficiency. Recent echocardiogram shows normal pump function with mild diastolic dysfunction and aortic valve insufficiency. Awaiting CT scan to assess aneurysm size and status. - Ensure CT scan is scheduled prior to upcoming cardiology appointment - Advise to contact cardiologist to confirm CT scan scheduling       Meds ordered this encounter  Medications   predniSONE  (DELTASONE ) 10 MG tablet    Sig: Take 60mg  PO daily x1d, then 50mg  daily x1d, then 40mg  daily x1d, then 30mg  daily x1d, then 20mg  daily x1d, then 10mg  daily x1d, then stop    Dispense:  21 tablet    Refill:  0     Return in about 3 months (around 02/26/2024) for as scheduled.      Jon Eva, MD  [facility] [phone] (phone) [phone]  (fax)  [facility]

## 2023-11-27 NOTE — Telephone Encounter (Signed)
 FYI Only or Action Required?: FYI only for provider.  Patient was last seen in primary care on 08/14/2023 by Myrla Jon HERO, MD.  Called Nurse Triage reporting Facial Swelling and Dizziness.  Symptoms began yesterday.  Interventions attempted: Rest, hydration, or home remedies.  Symptoms are: unchanged.  Triage Disposition: See HCP Within 4 Hours (Or PCP Triage)  Patient/caregiver understands and will follow disposition?: Yes       Copied from CRM [id number]. Topic: Clinical - Red Word Triage >> Nov 27, 2023  8:46 AM Tonda B wrote: [facility] that prompted transfer to Nurse Triage: patient is calling in she has swelling in her face as well as dizziness Reason for Disposition  [1] SEVERE eyelid swelling (e.g., eyelids swollen shut or almost shut) AND [2] involves both eyes  (Exception: Itchy eyes, which  are probably an allergic reaction.)  Answer Assessment - Initial Assessment Questions 1. ONSET: When did the swelling start? (e.g., minutes, hours, days)     yesterday 2. LOCATION: What part of the face is swollen? (e.g., cheek, entire face, jaw joint area, under jaw)     Under eye L > R, facial tightness 3. SEVERITY: How swollen is it?     L eye is almost swollen shut 4. ITCHING: Is there any itching? If Yes, ask: How much?   (Scale 1-10; mild, moderate or severe)     Yes, on eye lid 5. PAIN: Is the swelling painful to touch? If Yes, ask: How painful is it?   (Scale 0-10; mild, moderate or severe)     denies 6. FEVER: Do you have a fever? If Yes, ask: What is it, how was it measured, and when did it start?      denies 7. CAUSE: What do you think is causing the face swelling?     Unknown, thinks possible allergies. Denies any new changes. 8. NEW MEDICINES: Have there been any new medicines started recently?     denies 9. RECURRENT SYMPTOM: Have you had face swelling before? If Yes, ask: When was the last time? What happened that time?      N/a 10. OTHER SYMPTOMS: Do you have any other symptoms? (e.g., leg swelling, toothache)       Dizziness x 1 week, esp when reclining/laying down, some blurred vision/flashes noticed the other day.  11. PREGNANCY: Is there any chance you are pregnant? When was your last menstrual period?       N/a  Protocols used: Face Swelling-A-AH, Eyelid Swelling-A-AH

## 2023-11-27 NOTE — Telephone Encounter (Signed)
 Noted

## 2023-11-28 LAB — BASIC METABOLIC PANEL WITH GFR
BUN/Creatinine Ratio: 22 (ref 12–28)
BUN: 17 mg/dL (ref 8–27)
CO2: 25 mmol/L (ref 20–29)
Calcium: 9.5 mg/dL (ref 8.7–10.3)
Chloride: 104 mmol/L (ref 96–106)
Creatinine, Ser: 0.77 mg/dL (ref 0.57–1.00)
Glucose: 93 mg/dL (ref 70–99)
Potassium: 4.4 mmol/L (ref 3.5–5.2)
Sodium: 143 mmol/L (ref 134–144)
eGFR: 81 mL/min/1.73 (ref >59)

## 2023-11-28 LAB — MAGNESIUM: Magnesium: 2.1 mg/dL (ref 1.6–2.3)

## 2023-11-29 ENCOUNTER — Ambulatory Visit: Payer: Self-pay | Admitting: Family Medicine

## 2023-11-30 ENCOUNTER — Ambulatory Visit (HOSPITAL_COMMUNITY)
Admission: RE | Admit: 2023-11-30 | Discharge: 2023-11-30 | Disposition: A | Discharge status: Home / Self Care | Source: Ambulatory Visit | Attending: Vascular Surgery | Admitting: Vascular Surgery

## 2023-11-30 DIAGNOSIS — I712 Thoracic aortic aneurysm, without rupture, unspecified: Secondary | ICD-10-CM | POA: Insufficient documentation

## 2023-11-30 DIAGNOSIS — R911 Solitary pulmonary nodule: Secondary | ICD-10-CM | POA: Diagnosis not present

## 2023-11-30 DIAGNOSIS — I2510 Atherosclerotic heart disease of native coronary artery without angina pectoris: Secondary | ICD-10-CM | POA: Insufficient documentation

## 2023-11-30 DIAGNOSIS — Z95828 Presence of other vascular implants and grafts: Secondary | ICD-10-CM | POA: Insufficient documentation

## 2023-11-30 MED ORDER — IOHEXOL 350 MG/ML SOLN
75.0000 mL | Freq: Once | INTRAVENOUS | Status: AC | PRN
  Administered 2023-11-30: 75 mL via INTRAVENOUS

## 2023-12-11 ENCOUNTER — Encounter: Payer: Self-pay | Admitting: Vascular Surgery

## 2023-12-11 ENCOUNTER — Ambulatory Visit: Attending: Vascular Surgery | Admitting: Vascular Surgery

## 2023-12-11 VITALS — BP 155/88 | HR 85 | Temp 98.2°F | Ht 64.0 in | Wt 213.9 lb

## 2023-12-11 DIAGNOSIS — Z95828 Presence of other vascular implants and grafts: Secondary | ICD-10-CM | POA: Diagnosis not present

## 2023-12-11 NOTE — Progress Notes (Signed)
 [facility] [place] PROGRESS NOTE  ASSESSMENT / PLAN: Angel Lane is a 73 y.o. 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  Doing well overall.  No issues identified on CT angiogram.  Recommend:  Abstinence from all tobacco products. Blood glucose control with goal A1c < 7%. Blood pressure control with goal blood pressure < 130/80 mmHg. Lipid reduction therapy with goal LDL-C < 55 mg/dL. Aspirin  81mg  by mouth daily. Atorvastatin  40-80mg  PO QD (or other high intensity statin therapy).  Follow up with me in 1-2 years with repeat CT angiogram.  SUBJECTIVE: No complaints.  We reviewed her CT scan in detail.  OBJECTIVE: BP (!) 155/88 (BP Location: Left Arm, Patient Position: Sitting, Cuff Size: Large)   Pulse 85   Temp 98.2 F (36.8 C)   Ht 5' 4 (1.626 m)   Wt 213 lb 14.4 oz (97 kg)   SpO2 96%   BMI 36.72 kg/m   Constitutional: well appearing. no acute distress. CNS: 5/5 throughout Neck: incisions well healed. No palpable mass. No prominent lymph nodes. Cardiac: RRR. Pulmonary: unlabored Abdomen: soft Vascular: 2+ radial pulses     Latest Ref Rng & Units 05/29/2023   11:03 AM 03/30/2023    9:57 AM 03/18/2022    8:13 AM  CBC  WBC 4.0 - 10.5 K/uL 8.7  6.9  7.1   Hemoglobin 12.0 - 15.0 g/dL 86.5  86.5  85.6   Hematocrit 36.0 - 46.0 % 40.4  41.6  44.5   Platelets 150 - 400 K/uL 183  189  251         Latest Ref Rng & Units 11/27/2023    1:42 PM 08/14/2023    1:48 PM 07/05/2023   11:06 AM  CMP  Glucose 70 - 99 mg/dL 93  97  89   BUN 8 - 27 mg/dL 17  16  17    Creatinine 0.57 - 1.00 mg/dL 9.22  9.17  9.14   Sodium 134 - 144 mmol/L 143  140  137   Potassium 3.5 - 5.2 mmol/L 4.4  3.8  3.2   Chloride 96 - 106 mmol/L 104  98  107   CO2 20 - 29 mmol/L 25  24  26    Calcium  8.7 - 10.3 mg/dL 9.5  9.7  9.1   Total Protein 6.0 - 8.5 g/dL  6.7    Total Bilirubin 0.0 - 1.2 mg/dL  0.7    Alkaline  Phos 44 - 121 IU/L  102    AST 0 - 40 IU/L  21    ALT 0 - 32 IU/L  23      Estimated Creatinine Clearance: 69.7 mL/min (by C-G formula based on SCr of 0.77 mg/dL).  CT angiogram reviewed in detail.  Bilateral transpositions appear widely patent.  TEVAR appears in good position.  No evidence of endoleak.  Debby SAILOR. Magda, MD [facility] Phone Number: [phone] 12/11/2023 11:38 AM

## 2024-02-21 ENCOUNTER — Encounter: Admitting: Family Medicine

## 2024-03-04 ENCOUNTER — Encounter: Payer: Self-pay | Admitting: Family Medicine

## 2024-03-04 ENCOUNTER — Ambulatory Visit (INDEPENDENT_AMBULATORY_CARE_PROVIDER_SITE_OTHER): Admitting: Family Medicine

## 2024-03-04 VITALS — BP 128/74 | HR 85 | Temp 97.7°F | Ht 64.0 in | Wt 213.0 lb

## 2024-03-04 DIAGNOSIS — J449 Chronic obstructive pulmonary disease, unspecified: Secondary | ICD-10-CM | POA: Insufficient documentation

## 2024-03-04 DIAGNOSIS — Z0000 Encounter for general adult medical examination without abnormal findings: Secondary | ICD-10-CM | POA: Diagnosis not present

## 2024-03-04 DIAGNOSIS — H8110 Benign paroxysmal vertigo, unspecified ear: Secondary | ICD-10-CM

## 2024-03-04 DIAGNOSIS — E7800 Pure hypercholesterolemia, unspecified: Secondary | ICD-10-CM

## 2024-03-04 DIAGNOSIS — I7121 Aneurysm of the ascending aorta, without rupture: Secondary | ICD-10-CM

## 2024-03-04 DIAGNOSIS — Z9889 Other specified postprocedural states: Secondary | ICD-10-CM | POA: Diagnosis not present

## 2024-03-04 DIAGNOSIS — Z8679 Personal history of other diseases of the circulatory system: Secondary | ICD-10-CM

## 2024-03-04 DIAGNOSIS — Z1231 Encounter for screening mammogram for malignant neoplasm of breast: Secondary | ICD-10-CM

## 2024-03-04 DIAGNOSIS — R7303 Prediabetes: Secondary | ICD-10-CM | POA: Diagnosis not present

## 2024-03-04 NOTE — Assessment & Plan Note (Signed)
 Hypercholesterolemia with last cholesterol check in September. Due for re-evaluation. - Ordered cholesterol panel.

## 2024-03-04 NOTE — Assessment & Plan Note (Signed)
 Recommend low carb diet Recheck A1c

## 2024-03-04 NOTE — Assessment & Plan Note (Signed)
 COPD with symptoms of chest tightness and frequent albuterol  use. Breztri provides temporary relief. Concerns about potential flare-ups. - Continue current medications including Breztri and albuterol  as needed. - Monitor for potential flare-ups and adjust treatment as necessary.

## 2024-03-04 NOTE — Patient Instructions (Signed)
 Call [facility] to schedule a mammogram [phone]

## 2024-03-04 NOTE — Progress Notes (Signed)
 "    Complete physical exam   Patient: Angel Lane   DOB: Feb 15, 1951   73 y.o. Female  MRN: [id number] Visit Date: 03/04/2024  Today's healthcare provider: Jon Eva, MD   Chief Complaint  Patient presents with   Annual Exam    Patient is present for annual exam with PCP. Diet- Normal, eats less now  Exercise- Not as much currently due to cold weather, will use treadmill and stepper  Sleep- Not very well due to cpap, has addressed with pulmonologist Overall Feeling- Okay   Subjective    Angel Lane is a 73 y.o. female who presents today for a complete physical exam.   Discussed the use of AI scribe software for clinical note transcription with the patient, who gave verbal consent to proceed.  History of Present Illness   Angel Lane is a 73 year old female with COPD who presents for an annual physical exam.  She has early sleep onset around 8 PM with awakening at 3-4 AM. On awakening she has chest tightness that improves with Breztri but she often needs additional albuterol  during the day and feels its effect wears off before four hours, which worries her.  She had an aortic valve repair in 2022, an ascending aortic aneurysm, and three coronary stents.  She had a prior lung biopsy that was negative for cancer but showed hemorrhage. She continues annual CT scans for lung cancer screening.  She reports a prior mammogram that was very painful and caused prolonged breast soreness. She is willing to repeat mammography if a different technician can perform it.  She has intermittent dizziness, especially when lying on her back, described as a feeling of going out. It has not occurred in the last few weeks.  She is up to date on colon cancer screening and has declined flu and shingles vaccines.       Last depression screening scores    03/04/2024    2:49 PM 05/31/2023    2:02 PM 04/05/2023    3:15 PM  PHQ 2/9 Scores  PHQ - 2 Score 0 1 0  PHQ- 9 Score 6      Last fall risk screening    03/04/2024    2:49 PM  Fall Risk   Falls in the past year? 0  Number falls in past yr: 0  Injury with Fall? 0  Risk for fall due to : No Fall Risks  Follow up Falls evaluation completed        Medications: Show/hide medication list[1]  Review of Systems    Objective    BP 128/74 (BP Location: Left Arm, Patient Position: Sitting, Cuff Size: Large)   Pulse 85   Temp 97.7 F (36.5 C) (Oral)   Ht 5' 4 (1.626 m)   Wt 213 lb (96.6 kg)   SpO2 98%   BMI 36.56 kg/m    Physical Exam   No results found for any visits on 03/04/24.  Assessment & Plan    Routine Health Maintenance and Physical Exam  Exercise Activities and Dietary recommendations  Goals   None     Immunization History  Administered Date(s) Administered   PFIZER Comirnaty(Gray Top)Covid-19 Tri-Sucrose Vaccine 04/17/2020   PFIZER(Purple Top)SARS-COV-2 Vaccination 09/24/2019, 10/15/2019   Pneumococcal Conjugate-13 03/23/2017   Pneumococcal Polysaccharide-23 05/29/2019   Tdap 10/08/2017    Health Maintenance  Topic Date Due   COVID-19 Vaccine (4 - 2025-26 season) 11/05/2023   Zoster Vaccines- Shingrix (1 of 2) 06/02/2024 (Originally  03/30/1969)   Influenza Vaccine  06/03/2024 (Originally 10/05/2023)   Mammogram  09/27/2024   Lung Cancer Screening  11/29/2024   DTaP/Tdap/Td (2 - Td or Tdap) 10/09/2027   Colonoscopy  01/20/2030   Pneumococcal Vaccine: 50+ Years  Completed   Bone Density Scan  Completed   Hepatitis C Screening  Completed   Meningococcal B Vaccine  Aged Out    Discussed health benefits of physical activity, and encouraged her to engage in regular exercise appropriate for her age and condition.  Problem List Items Addressed This Visit       Cardiovascular and Mediastinum   Aneurysm of ascending aorta without rupture     Respiratory   Moderate COPD (chronic obstructive pulmonary disease) (HCC)   COPD with symptoms of chest tightness and frequent  albuterol  use. Breztri provides temporary relief. Concerns about potential flare-ups. - Continue current medications including Breztri and albuterol  as needed. - Monitor for potential flare-ups and adjust treatment as necessary.        Other   Pure hypercholesterolemia   Hypercholesterolemia with last cholesterol check in September. Due for re-evaluation. - Ordered cholesterol panel.      Relevant Orders   Comprehensive metabolic panel with GFR   Lipid Panel With LDL/HDL Ratio   History of repair of dissecting thoracic aneurysm   Prediabetes   Recommend low carb diet Recheck A1c       Relevant Orders   Hemoglobin A1c   Other Visit Diagnoses       Encounter for annual physical exam    -  Primary     Breast cancer screening by mammogram       Relevant Orders   MM 3D SCREENING MAMMOGRAM BILATERAL BREAST     Benign paroxysmal positional vertigo, unspecified laterality               Adult Wellness Visit Routine adult wellness visit with discussion on cancer screenings and vaccinations. Mammogram due, previous experience with excessive pressure causing discomfort. Lung and colon cancer screenings are up to date. Vaccinations for pneumonia, flu, and shingles declined. - Ordered mammogram and advised to request a different technician if scheduling another mammogram. - Continue annual lung cancer screening with CT scans. - No further colon cancer screening needed as she will age out of the screening guidelines.  Thoracic aortic aneurysm, post-repair Thoracic aortic aneurysm post-repair with no current indication for further intervention. - Continue monitoring with vascular surgery.  Benign paroxysmal positional vertigo Intermittent dizziness when lying on back, suggestive of benign paroxysmal positional vertigo. Symptoms have been absent for a few weeks.        Return in about 6 months (around 09/02/2024) for chronic disease f/u.     Jon Eva, MD  [facility] [phone] (phone) [phone] (fax)  [facility]     [1]  Outpatient Medications Prior to Visit  Medication Sig   acetaminophen  (TYLENOL ) 500 MG tablet Take 500 mg by mouth every 6 (six) hours as needed for moderate pain.   albuterol  (VENTOLIN  HFA) 108 (90 Base) MCG/ACT inhaler 2 puffs every 4 (four) hours as needed for wheezing or shortness of breath.   aspirin  EC 81 MG tablet Take 1 tablet (81 mg total) by mouth daily. Swallow whole.   Budeson-Glycopyrrol-Formoterol  (BREZTRI AEROSPHERE) 160-9-4.8 MCG/ACT AERO Inhale 2 puffs into the lungs 2 (two) times daily.   fluticasone  (FLONASE ) 50 MCG/ACT nasal spray Place 2 sprays into both nostrils daily.   hydrochlorothiazide  (HYDRODIURIL ) 25  MG tablet Take 25 mg by mouth as needed.   montelukast (SINGULAIR) 10 MG tablet Take 10 mg by mouth at bedtime.   rosuvastatin  (CRESTOR ) 10 MG tablet Take 1 tablet (10 mg total) by mouth daily.   sodium chloride  (OCEAN) 0.65 % SOLN nasal spray Place 1 spray into both nostrils as needed for congestion.   [DISCONTINUED] predniSONE  (DELTASONE ) 10 MG tablet Take 60mg  PO daily x1d, then 50mg  daily x1d, then 40mg  daily x1d, then 30mg  daily x1d, then 20mg  daily x1d, then 10mg  daily x1d, then stop   No facility-administered medications prior to visit.   "

## 2024-03-05 LAB — LIPID PANEL WITH LDL/HDL RATIO
Cholesterol, Total: 161 mg/dL (ref 100–199)
HDL: 56 mg/dL
LDL Chol Calc (NIH): 90 mg/dL (ref 0–99)
LDL/HDL Ratio: 1.6 ratio (ref 0.0–3.2)
Triglycerides: 78 mg/dL (ref 0–149)
VLDL Cholesterol Cal: 15 mg/dL (ref 5–40)

## 2024-03-05 LAB — COMPREHENSIVE METABOLIC PANEL WITH GFR
ALT: 17 IU/L (ref 0–32)
AST: 17 IU/L (ref 0–40)
Albumin: 3.9 g/dL (ref 3.8–4.8)
Alkaline Phosphatase: 90 IU/L (ref 49–135)
BUN/Creatinine Ratio: 16 (ref 12–28)
BUN: 18 mg/dL (ref 8–27)
Bilirubin Total: 0.5 mg/dL (ref 0.0–1.2)
CO2: 25 mmol/L (ref 20–29)
Calcium: 9.5 mg/dL (ref 8.7–10.3)
Chloride: 103 mmol/L (ref 96–106)
Creatinine, Ser: 1.12 mg/dL — ABNORMAL HIGH (ref 0.57–1.00)
Globulin, Total: 2.7 g/dL (ref 1.5–4.5)
Glucose: 91 mg/dL (ref 70–99)
Potassium: 4.3 mmol/L (ref 3.5–5.2)
Sodium: 142 mmol/L (ref 134–144)
Total Protein: 6.6 g/dL (ref 6.0–8.5)
eGFR: 52 mL/min/1.73 — ABNORMAL LOW

## 2024-03-05 LAB — HEMOGLOBIN A1C
Est. average glucose Bld gHb Est-mCnc: 120 mg/dL
Hgb A1c MFr Bld: 5.8 % — ABNORMAL HIGH (ref 4.8–5.6)

## 2024-03-11 ENCOUNTER — Ambulatory Visit: Payer: Self-pay | Admitting: Family Medicine

## 2024-03-12 ENCOUNTER — Other Ambulatory Visit: Payer: Self-pay

## 2024-03-12 DIAGNOSIS — E7800 Pure hypercholesterolemia, unspecified: Secondary | ICD-10-CM

## 2024-03-12 NOTE — Progress Notes (Signed)
 Last read by Jamee CHRISTELLA Rattler at 5:12PM on 03/11/2024. Order has been placed.

## 2024-04-05 ENCOUNTER — Emergency Department

## 2024-04-05 ENCOUNTER — Emergency Department
Admission: EM | Admit: 2024-04-05 | Discharge: 2024-04-05 | Disposition: A | Discharge status: Home / Self Care | Attending: Emergency Medicine | Admitting: Emergency Medicine

## 2024-04-05 ENCOUNTER — Other Ambulatory Visit: Payer: Self-pay

## 2024-04-05 DIAGNOSIS — J449 Chronic obstructive pulmonary disease, unspecified: Secondary | ICD-10-CM | POA: Diagnosis not present

## 2024-04-05 DIAGNOSIS — I10 Essential (primary) hypertension: Secondary | ICD-10-CM | POA: Diagnosis not present

## 2024-04-05 DIAGNOSIS — I2510 Atherosclerotic heart disease of native coronary artery without angina pectoris: Secondary | ICD-10-CM | POA: Diagnosis not present

## 2024-04-05 DIAGNOSIS — R079 Chest pain, unspecified: Secondary | ICD-10-CM | POA: Insufficient documentation

## 2024-04-05 DIAGNOSIS — M546 Pain in thoracic spine: Secondary | ICD-10-CM | POA: Diagnosis not present

## 2024-04-05 DIAGNOSIS — K5730 Diverticulosis of large intestine without perforation or abscess without bleeding: Secondary | ICD-10-CM | POA: Insufficient documentation

## 2024-04-05 LAB — LIPASE, BLOOD: Lipase: 31 U/L (ref 11–51)

## 2024-04-05 LAB — COMPREHENSIVE METABOLIC PANEL WITH GFR
ALT: 12 U/L (ref 0–44)
AST: 15 U/L (ref 15–41)
Albumin: 3.8 g/dL (ref 3.5–5.0)
Alkaline Phosphatase: 85 U/L (ref 38–126)
Anion gap: 10 (ref 5–15)
BUN: 15 mg/dL (ref 8–23)
CO2: 27 mmol/L (ref 22–32)
Calcium: 9.4 mg/dL (ref 8.9–10.3)
Chloride: 102 mmol/L (ref 98–111)
Creatinine, Ser: 0.77 mg/dL (ref 0.44–1.00)
GFR, Estimated: >60 mL/min
Glucose, Bld: 119 mg/dL — ABNORMAL HIGH (ref 70–99)
Potassium: 3.5 mmol/L (ref 3.5–5.1)
Sodium: 139 mmol/L (ref 135–145)
Total Bilirubin: 0.5 mg/dL (ref 0.0–1.2)
Total Protein: 6.8 g/dL (ref 6.5–8.1)

## 2024-04-05 LAB — CBC WITH DIFFERENTIAL/PLATELET
Abs Immature Granulocytes: 0.02 10*3/uL (ref 0.00–0.07)
Basophils Absolute: 0 10*3/uL (ref 0.0–0.1)
Basophils Relative: 0 %
Eosinophils Absolute: 0.2 10*3/uL (ref 0.0–0.5)
Eosinophils Relative: 2 %
HCT: 37 % (ref 36.0–46.0)
Hemoglobin: 11.9 g/dL — ABNORMAL LOW (ref 12.0–15.0)
Immature Granulocytes: 0 %
Lymphocytes Relative: 44 %
Lymphs Abs: 4 10*3/uL (ref 0.7–4.0)
MCH: 29.2 pg (ref 26.0–34.0)
MCHC: 32.2 g/dL (ref 30.0–36.0)
MCV: 90.9 fL (ref 80.0–100.0)
Monocytes Absolute: 0.7 10*3/uL (ref 0.1–1.0)
Monocytes Relative: 8 %
Neutro Abs: 4.2 10*3/uL (ref 1.7–7.7)
Neutrophils Relative %: 46 %
Platelets: 219 10*3/uL (ref 150–400)
RBC: 4.07 MIL/uL (ref 3.87–5.11)
RDW: 12.4 % (ref 11.5–15.5)
Smear Review: NORMAL
WBC: 9.1 10*3/uL (ref 4.0–10.5)
nRBC: 0 % (ref 0.0–0.2)

## 2024-04-05 LAB — TROPONIN T, HIGH SENSITIVITY
Troponin T High Sensitivity: <6 ng/L (ref 0–19)
Troponin T High Sensitivity: <6 ng/L (ref 0–19)

## 2024-04-05 MED ORDER — IOHEXOL 350 MG/ML SOLN
100.0000 mL | Freq: Once | INTRAVENOUS | Status: AC | PRN
  Administered 2024-04-05: 100 mL via INTRAVENOUS

## 2024-04-05 MED ORDER — MORPHINE SULFATE (PF) 2 MG/ML IV SOLN: 2.0000 mg | INTRAVENOUS | Status: DC | PRN

## 2024-04-05 NOTE — ED Notes (Signed)
 Patient ambulated to toilet, was repositioned in bed.  Provided with a warm blanket.

## 2024-04-05 NOTE — ED Notes (Addendum)
 Purple, LT green, Grey, blue tubes sent to lab.

## 2024-04-05 NOTE — Discharge Instructions (Signed)
 The pain in your chest and back was evaluated with blood testing and CT imaging that fortunately did not show any emergency conditions like new dangerous changes with your aorta, heart attack, punctured lung or blood clot.  Thank you for choosing us  for your health care today!  Please see your primary doctor this week for a follow up appointment.   If you have any new, worsening, or unexpected symptoms call your doctor right away or come back to the emergency department for reevaluation.  It was my pleasure to care for you today.   Ginnie EDISON Cyrena, MD

## 2024-04-05 NOTE — ED Triage Notes (Signed)
 PT bib [facility], from home. On and off SOB with burning in lungs. HX sarcoidosis.  VS 103CBG / BP 160/80/ HR78/ 18G LAC.  125 solumedrol 1 duoneb

## 2024-09-02 ENCOUNTER — Ambulatory Visit: Admitting: Family Medicine
